# Patient Record
Sex: Male | Born: 1940 | Race: White | Hispanic: No | Marital: Married | Smoking: Former smoker
Health system: Southern US, Community
[De-identification: ages and names within clinical notes are randomized; demographics above are authoritative.]

## PROBLEM LIST (undated history)

## (undated) DIAGNOSIS — I2510 Atherosclerotic heart disease of native coronary artery without angina pectoris: Secondary | ICD-10-CM

## (undated) DIAGNOSIS — Z860100 Personal history of colon polyps, unspecified: Secondary | ICD-10-CM

## (undated) DIAGNOSIS — D649 Anemia, unspecified: Secondary | ICD-10-CM

## (undated) DIAGNOSIS — C801 Malignant (primary) neoplasm, unspecified: Secondary | ICD-10-CM

## (undated) DIAGNOSIS — D692 Other nonthrombocytopenic purpura: Secondary | ICD-10-CM

## (undated) DIAGNOSIS — R252 Cramp and spasm: Secondary | ICD-10-CM

## (undated) DIAGNOSIS — R7303 Prediabetes: Secondary | ICD-10-CM

## (undated) DIAGNOSIS — M1990 Unspecified osteoarthritis, unspecified site: Secondary | ICD-10-CM

## (undated) DIAGNOSIS — I700 Atherosclerosis of aorta: Secondary | ICD-10-CM

## (undated) DIAGNOSIS — I10 Essential (primary) hypertension: Secondary | ICD-10-CM

## (undated) DIAGNOSIS — I350 Nonrheumatic aortic (valve) stenosis: Secondary | ICD-10-CM

## (undated) DIAGNOSIS — M2550 Pain in unspecified joint: Secondary | ICD-10-CM

## (undated) DIAGNOSIS — R0600 Dyspnea, unspecified: Secondary | ICD-10-CM

## (undated) DIAGNOSIS — N183 Chronic kidney disease, stage 3 unspecified: Secondary | ICD-10-CM

## (undated) DIAGNOSIS — R3915 Urgency of urination: Secondary | ICD-10-CM

## (undated) DIAGNOSIS — D494 Neoplasm of unspecified behavior of bladder: Secondary | ICD-10-CM

## (undated) DIAGNOSIS — N189 Chronic kidney disease, unspecified: Secondary | ICD-10-CM

## (undated) DIAGNOSIS — R011 Cardiac murmur, unspecified: Secondary | ICD-10-CM

## (undated) DIAGNOSIS — I209 Angina pectoris, unspecified: Secondary | ICD-10-CM

## (undated) DIAGNOSIS — C44300 Unspecified malignant neoplasm of skin of unspecified part of face: Secondary | ICD-10-CM

## (undated) DIAGNOSIS — E119 Type 2 diabetes mellitus without complications: Secondary | ICD-10-CM

## (undated) DIAGNOSIS — I272 Pulmonary hypertension, unspecified: Secondary | ICD-10-CM

## (undated) DIAGNOSIS — H269 Unspecified cataract: Secondary | ICD-10-CM

## (undated) DIAGNOSIS — I499 Cardiac arrhythmia, unspecified: Secondary | ICD-10-CM

## (undated) DIAGNOSIS — I509 Heart failure, unspecified: Secondary | ICD-10-CM

## (undated) DIAGNOSIS — E785 Hyperlipidemia, unspecified: Secondary | ICD-10-CM

## (undated) DIAGNOSIS — Z789 Other specified health status: Secondary | ICD-10-CM

## (undated) HISTORY — DX: Chronic kidney disease, unspecified: N18.9

## (undated) HISTORY — DX: Unspecified malignant neoplasm of skin of unspecified part of face: C44.300

## (undated) HISTORY — DX: Cardiac murmur, unspecified: R01.1

## (undated) HISTORY — DX: Hyperlipidemia, unspecified: E78.5

## (undated) HISTORY — PX: NO PAST SURGERIES: SHX2092

## (undated) HISTORY — DX: Essential (primary) hypertension: I10

## (undated) HISTORY — PX: SKIN SURGERY: SHX2413

## (undated) HISTORY — DX: Anemia, unspecified: D64.9

## (undated) HISTORY — PX: CARDIAC VALVE REPLACEMENT: SHX585

## (undated) HISTORY — DX: Unspecified osteoarthritis, unspecified site: M19.90

## (undated) HISTORY — PX: JOINT REPLACEMENT: SHX530

## (undated) HISTORY — PX: CYSTOURETHROSCOPY: SHX476

## (undated) HISTORY — DX: Type 2 diabetes mellitus without complications: E11.9

## (undated) HISTORY — DX: Other nonthrombocytopenic purpura: D69.2

## (undated) HISTORY — PX: BLADDER SURGERY: SHX569

## (undated) HISTORY — DX: Malignant (primary) neoplasm, unspecified: C80.1

## (undated) HISTORY — PX: COLONOSCOPY: SHX174

---

## 2005-03-01 LAB — HM COLONOSCOPY

## 2007-03-02 DIAGNOSIS — E119 Type 2 diabetes mellitus without complications: Secondary | ICD-10-CM

## 2007-03-02 HISTORY — DX: Type 2 diabetes mellitus without complications: E11.9

## 2010-01-30 ENCOUNTER — Inpatient Hospital Stay (HOSPITAL_COMMUNITY)
Admission: RE | Admit: 2010-01-30 | Discharge: 2010-02-02 | Payer: Self-pay | Source: Home / Self Care | Admitting: Orthopedic Surgery

## 2010-05-12 LAB — URINALYSIS, ROUTINE W REFLEX MICROSCOPIC
Bilirubin Urine: NEGATIVE
Glucose, UA: 100 mg/dL — AB
Hgb urine dipstick: NEGATIVE
Specific Gravity, Urine: 1.02 (ref 1.005–1.030)

## 2010-05-12 LAB — DIFFERENTIAL
Basophils Relative: 0 % (ref 0–1)
Eosinophils Absolute: 0.2 10*3/uL (ref 0.0–0.7)
Eosinophils Relative: 3 % (ref 0–5)
Lymphs Abs: 2.2 10*3/uL (ref 0.7–4.0)

## 2010-05-12 LAB — CBC
HCT: 29.4 % — ABNORMAL LOW (ref 39.0–52.0)
HCT: 39.2 % (ref 39.0–52.0)
Hemoglobin: 9.7 g/dL — ABNORMAL LOW (ref 13.0–17.0)
Hemoglobin: 13.6 g/dL (ref 13.0–17.0)
MCHC: 33 g/dL (ref 30.0–36.0)
MCHC: 33.1 g/dL (ref 30.0–36.0)
Platelets: 186 10*3/uL (ref 150–400)
Platelets: 203 10*3/uL (ref 150–400)
RBC: 3.45 MIL/uL — ABNORMAL LOW (ref 4.22–5.81)
RBC: 4.6 MIL/uL (ref 4.22–5.81)
RDW: 12.9 % (ref 11.5–15.5)
WBC: 8.6 10*3/uL (ref 4.0–10.5)
WBC: 6.3 10*3/uL (ref 4.0–10.5)

## 2010-05-12 LAB — TYPE AND SCREEN: Antibody Screen: NEGATIVE

## 2010-05-12 LAB — GLUCOSE, CAPILLARY
Glucose-Capillary: 138 mg/dL — ABNORMAL HIGH (ref 70–99)
Glucose-Capillary: 155 mg/dL — ABNORMAL HIGH (ref 70–99)
Glucose-Capillary: 169 mg/dL — ABNORMAL HIGH (ref 70–99)
Glucose-Capillary: 172 mg/dL — ABNORMAL HIGH (ref 70–99)
Glucose-Capillary: 173 mg/dL — ABNORMAL HIGH (ref 70–99)
Glucose-Capillary: 176 mg/dL — ABNORMAL HIGH (ref 70–99)
Glucose-Capillary: 180 mg/dL — ABNORMAL HIGH (ref 70–99)
Glucose-Capillary: 184 mg/dL — ABNORMAL HIGH (ref 70–99)

## 2010-05-12 LAB — PROTIME-INR
Prothrombin Time: 19.7 seconds — ABNORMAL HIGH (ref 11.6–15.2)
Prothrombin Time: 13.1 seconds (ref 11.6–15.2)

## 2010-05-12 LAB — SURGICAL PCR SCREEN
MRSA, PCR: NEGATIVE
Staphylococcus aureus: NEGATIVE

## 2010-05-12 LAB — COMPREHENSIVE METABOLIC PANEL
ALT: 32 U/L (ref 0–53)
AST: 35 U/L (ref 0–37)
Alkaline Phosphatase: 65 U/L (ref 39–117)
CO2: 30 mEq/L (ref 19–32)
Calcium: 8.9 mg/dL (ref 8.4–10.5)
Chloride: 101 mEq/L (ref 96–112)
GFR calc Af Amer: >60 mL/min (ref >60)
GFR calc non Af Amer: >60 mL/min (ref >60)
Potassium: 3.6 mEq/L (ref 3.5–5.1)
Sodium: 141 mEq/L (ref 135–145)
Total Bilirubin: 0.8 mg/dL (ref 0.3–1.2)

## 2010-05-12 LAB — HEMOGLOBIN AND HEMATOCRIT, BLOOD: Hemoglobin: 11 g/dL — ABNORMAL LOW (ref 13.0–17.0)

## 2010-05-12 LAB — ABO/RH: ABO/RH(D): A NEG

## 2011-07-07 DIAGNOSIS — E291 Testicular hypofunction: Secondary | ICD-10-CM | POA: Diagnosis not present

## 2011-07-07 DIAGNOSIS — N529 Male erectile dysfunction, unspecified: Secondary | ICD-10-CM | POA: Diagnosis not present

## 2011-07-30 DIAGNOSIS — M169 Osteoarthritis of hip, unspecified: Secondary | ICD-10-CM | POA: Diagnosis not present

## 2011-09-24 DIAGNOSIS — I10 Essential (primary) hypertension: Secondary | ICD-10-CM | POA: Diagnosis not present

## 2011-09-24 DIAGNOSIS — E7800 Pure hypercholesterolemia, unspecified: Secondary | ICD-10-CM | POA: Diagnosis not present

## 2011-09-24 DIAGNOSIS — E785 Hyperlipidemia, unspecified: Secondary | ICD-10-CM | POA: Diagnosis not present

## 2011-09-24 DIAGNOSIS — E119 Type 2 diabetes mellitus without complications: Secondary | ICD-10-CM | POA: Diagnosis not present

## 2011-09-29 DIAGNOSIS — M2040 Other hammer toe(s) (acquired), unspecified foot: Secondary | ICD-10-CM | POA: Diagnosis not present

## 2011-09-29 DIAGNOSIS — B351 Tinea unguium: Secondary | ICD-10-CM | POA: Diagnosis not present

## 2011-09-29 DIAGNOSIS — M79609 Pain in unspecified limb: Secondary | ICD-10-CM | POA: Diagnosis not present

## 2011-12-23 DIAGNOSIS — E119 Type 2 diabetes mellitus without complications: Secondary | ICD-10-CM | POA: Diagnosis not present

## 2011-12-23 DIAGNOSIS — E7800 Pure hypercholesterolemia, unspecified: Secondary | ICD-10-CM | POA: Diagnosis not present

## 2011-12-23 DIAGNOSIS — I10 Essential (primary) hypertension: Secondary | ICD-10-CM | POA: Diagnosis not present

## 2011-12-23 DIAGNOSIS — E785 Hyperlipidemia, unspecified: Secondary | ICD-10-CM | POA: Diagnosis not present

## 2011-12-23 DIAGNOSIS — Z23 Encounter for immunization: Secondary | ICD-10-CM | POA: Diagnosis not present

## 2012-03-10 DIAGNOSIS — M1710 Unilateral primary osteoarthritis, unspecified knee: Secondary | ICD-10-CM | POA: Diagnosis not present

## 2012-04-07 DIAGNOSIS — E7800 Pure hypercholesterolemia, unspecified: Secondary | ICD-10-CM | POA: Diagnosis not present

## 2012-04-07 DIAGNOSIS — I10 Essential (primary) hypertension: Secondary | ICD-10-CM | POA: Diagnosis not present

## 2012-04-07 DIAGNOSIS — E119 Type 2 diabetes mellitus without complications: Secondary | ICD-10-CM | POA: Diagnosis not present

## 2012-04-07 DIAGNOSIS — E785 Hyperlipidemia, unspecified: Secondary | ICD-10-CM | POA: Diagnosis not present

## 2012-05-04 DIAGNOSIS — M1710 Unilateral primary osteoarthritis, unspecified knee: Secondary | ICD-10-CM | POA: Diagnosis not present

## 2012-05-24 DIAGNOSIS — I10 Essential (primary) hypertension: Secondary | ICD-10-CM | POA: Diagnosis not present

## 2012-05-24 DIAGNOSIS — E785 Hyperlipidemia, unspecified: Secondary | ICD-10-CM | POA: Diagnosis not present

## 2012-05-24 DIAGNOSIS — Z9181 History of falling: Secondary | ICD-10-CM | POA: Diagnosis not present

## 2012-11-22 DIAGNOSIS — E785 Hyperlipidemia, unspecified: Secondary | ICD-10-CM | POA: Diagnosis not present

## 2012-11-22 DIAGNOSIS — IMO0001 Reserved for inherently not codable concepts without codable children: Secondary | ICD-10-CM | POA: Diagnosis not present

## 2012-11-22 DIAGNOSIS — Z0000 Encounter for general adult medical examination without abnormal findings: Secondary | ICD-10-CM | POA: Diagnosis not present

## 2012-11-22 DIAGNOSIS — R011 Cardiac murmur, unspecified: Secondary | ICD-10-CM | POA: Diagnosis not present

## 2012-11-22 DIAGNOSIS — I059 Rheumatic mitral valve disease, unspecified: Secondary | ICD-10-CM | POA: Diagnosis not present

## 2012-11-22 DIAGNOSIS — I10 Essential (primary) hypertension: Secondary | ICD-10-CM | POA: Diagnosis not present

## 2012-11-22 DIAGNOSIS — Z125 Encounter for screening for malignant neoplasm of prostate: Secondary | ICD-10-CM | POA: Diagnosis not present

## 2012-11-28 DIAGNOSIS — E119 Type 2 diabetes mellitus without complications: Secondary | ICD-10-CM | POA: Diagnosis not present

## 2012-12-06 DIAGNOSIS — R319 Hematuria, unspecified: Secondary | ICD-10-CM | POA: Diagnosis not present

## 2012-12-06 DIAGNOSIS — E291 Testicular hypofunction: Secondary | ICD-10-CM | POA: Diagnosis not present

## 2012-12-06 DIAGNOSIS — Z8551 Personal history of malignant neoplasm of bladder: Secondary | ICD-10-CM | POA: Diagnosis not present

## 2012-12-06 DIAGNOSIS — C679 Malignant neoplasm of bladder, unspecified: Secondary | ICD-10-CM | POA: Diagnosis not present

## 2012-12-13 ENCOUNTER — Ambulatory Visit: Payer: Self-pay | Admitting: Urology

## 2012-12-13 DIAGNOSIS — I10 Essential (primary) hypertension: Secondary | ICD-10-CM | POA: Diagnosis not present

## 2012-12-13 DIAGNOSIS — Z01812 Encounter for preprocedural laboratory examination: Secondary | ICD-10-CM | POA: Diagnosis not present

## 2012-12-13 DIAGNOSIS — N329 Bladder disorder, unspecified: Secondary | ICD-10-CM | POA: Diagnosis not present

## 2012-12-13 DIAGNOSIS — Z01810 Encounter for preprocedural cardiovascular examination: Secondary | ICD-10-CM | POA: Diagnosis not present

## 2012-12-13 LAB — BASIC METABOLIC PANEL
Calcium, Total: 9 mg/dL (ref 8.5–10.1)
Co2: 33 mmol/L — ABNORMAL HIGH (ref 21–32)
Creatinine: 0.98 mg/dL (ref 0.60–1.30)
EGFR (Non-African Amer.): >60
Glucose: 151 mg/dL — ABNORMAL HIGH (ref 65–99)

## 2012-12-18 ENCOUNTER — Ambulatory Visit: Payer: Self-pay | Admitting: Urology

## 2012-12-18 DIAGNOSIS — Z96649 Presence of unspecified artificial hip joint: Secondary | ICD-10-CM | POA: Diagnosis not present

## 2012-12-18 DIAGNOSIS — Z87891 Personal history of nicotine dependence: Secondary | ICD-10-CM | POA: Diagnosis not present

## 2012-12-18 DIAGNOSIS — C679 Malignant neoplasm of bladder, unspecified: Secondary | ICD-10-CM | POA: Diagnosis not present

## 2012-12-18 DIAGNOSIS — Z79899 Other long term (current) drug therapy: Secondary | ICD-10-CM | POA: Diagnosis not present

## 2012-12-18 DIAGNOSIS — R011 Cardiac murmur, unspecified: Secondary | ICD-10-CM | POA: Diagnosis not present

## 2012-12-18 DIAGNOSIS — E785 Hyperlipidemia, unspecified: Secondary | ICD-10-CM | POA: Diagnosis not present

## 2012-12-18 DIAGNOSIS — I10 Essential (primary) hypertension: Secondary | ICD-10-CM | POA: Diagnosis not present

## 2012-12-18 DIAGNOSIS — E119 Type 2 diabetes mellitus without complications: Secondary | ICD-10-CM | POA: Diagnosis not present

## 2012-12-18 DIAGNOSIS — M129 Arthropathy, unspecified: Secondary | ICD-10-CM | POA: Diagnosis not present

## 2012-12-18 DIAGNOSIS — Z8551 Personal history of malignant neoplasm of bladder: Secondary | ICD-10-CM | POA: Diagnosis not present

## 2012-12-18 DIAGNOSIS — N400 Enlarged prostate without lower urinary tract symptoms: Secondary | ICD-10-CM | POA: Diagnosis not present

## 2012-12-18 DIAGNOSIS — N419 Inflammatory disease of prostate, unspecified: Secondary | ICD-10-CM | POA: Diagnosis not present

## 2012-12-19 DIAGNOSIS — C679 Malignant neoplasm of bladder, unspecified: Secondary | ICD-10-CM | POA: Diagnosis not present

## 2012-12-19 DIAGNOSIS — C669 Malignant neoplasm of unspecified ureter: Secondary | ICD-10-CM | POA: Diagnosis not present

## 2012-12-20 LAB — PATHOLOGY REPORT

## 2012-12-26 DIAGNOSIS — C669 Malignant neoplasm of unspecified ureter: Secondary | ICD-10-CM | POA: Diagnosis not present

## 2012-12-26 DIAGNOSIS — C679 Malignant neoplasm of bladder, unspecified: Secondary | ICD-10-CM | POA: Diagnosis not present

## 2012-12-27 ENCOUNTER — Ambulatory Visit: Payer: Self-pay | Admitting: Urology

## 2012-12-27 DIAGNOSIS — C669 Malignant neoplasm of unspecified ureter: Secondary | ICD-10-CM | POA: Diagnosis not present

## 2012-12-27 DIAGNOSIS — N1330 Unspecified hydronephrosis: Secondary | ICD-10-CM | POA: Diagnosis not present

## 2012-12-27 DIAGNOSIS — N134 Hydroureter: Secondary | ICD-10-CM | POA: Diagnosis not present

## 2012-12-27 DIAGNOSIS — K8020 Calculus of gallbladder without cholecystitis without obstruction: Secondary | ICD-10-CM | POA: Diagnosis not present

## 2012-12-27 DIAGNOSIS — D4959 Neoplasm of unspecified behavior of other genitourinary organ: Secondary | ICD-10-CM | POA: Diagnosis not present

## 2012-12-29 DIAGNOSIS — C669 Malignant neoplasm of unspecified ureter: Secondary | ICD-10-CM | POA: Diagnosis not present

## 2013-01-04 DIAGNOSIS — C679 Malignant neoplasm of bladder, unspecified: Secondary | ICD-10-CM | POA: Diagnosis not present

## 2013-01-11 DIAGNOSIS — C679 Malignant neoplasm of bladder, unspecified: Secondary | ICD-10-CM | POA: Diagnosis not present

## 2013-01-19 DIAGNOSIS — Z96649 Presence of unspecified artificial hip joint: Secondary | ICD-10-CM | POA: Diagnosis not present

## 2013-01-19 DIAGNOSIS — Z87891 Personal history of nicotine dependence: Secondary | ICD-10-CM | POA: Diagnosis not present

## 2013-01-19 DIAGNOSIS — I10 Essential (primary) hypertension: Secondary | ICD-10-CM | POA: Insufficient documentation

## 2013-01-19 DIAGNOSIS — Z01812 Encounter for preprocedural laboratory examination: Secondary | ICD-10-CM | POA: Diagnosis not present

## 2013-01-19 DIAGNOSIS — Z01818 Encounter for other preprocedural examination: Secondary | ICD-10-CM | POA: Diagnosis not present

## 2013-01-19 DIAGNOSIS — Z96659 Presence of unspecified artificial knee joint: Secondary | ICD-10-CM | POA: Diagnosis not present

## 2013-01-19 DIAGNOSIS — E119 Type 2 diabetes mellitus without complications: Secondary | ICD-10-CM | POA: Diagnosis not present

## 2013-01-19 DIAGNOSIS — C676 Malignant neoplasm of ureteric orifice: Secondary | ICD-10-CM | POA: Diagnosis not present

## 2013-01-19 DIAGNOSIS — E785 Hyperlipidemia, unspecified: Secondary | ICD-10-CM | POA: Insufficient documentation

## 2013-01-19 DIAGNOSIS — N1330 Unspecified hydronephrosis: Secondary | ICD-10-CM | POA: Diagnosis not present

## 2013-01-19 DIAGNOSIS — M1710 Unilateral primary osteoarthritis, unspecified knee: Secondary | ICD-10-CM | POA: Diagnosis not present

## 2013-01-19 DIAGNOSIS — Z79899 Other long term (current) drug therapy: Secondary | ICD-10-CM | POA: Diagnosis not present

## 2013-01-29 DIAGNOSIS — Z79899 Other long term (current) drug therapy: Secondary | ICD-10-CM | POA: Diagnosis not present

## 2013-01-29 DIAGNOSIS — Z87891 Personal history of nicotine dependence: Secondary | ICD-10-CM | POA: Diagnosis not present

## 2013-01-29 DIAGNOSIS — I10 Essential (primary) hypertension: Secondary | ICD-10-CM | POA: Diagnosis not present

## 2013-01-29 DIAGNOSIS — E119 Type 2 diabetes mellitus without complications: Secondary | ICD-10-CM | POA: Diagnosis not present

## 2013-01-29 DIAGNOSIS — E785 Hyperlipidemia, unspecified: Secondary | ICD-10-CM | POA: Diagnosis not present

## 2013-01-29 DIAGNOSIS — Z96659 Presence of unspecified artificial knee joint: Secondary | ICD-10-CM | POA: Diagnosis not present

## 2013-01-29 DIAGNOSIS — Z96649 Presence of unspecified artificial hip joint: Secondary | ICD-10-CM | POA: Diagnosis not present

## 2013-01-29 DIAGNOSIS — D4959 Neoplasm of unspecified behavior of other genitourinary organ: Secondary | ICD-10-CM | POA: Diagnosis not present

## 2013-01-29 DIAGNOSIS — Z8551 Personal history of malignant neoplasm of bladder: Secondary | ICD-10-CM | POA: Diagnosis not present

## 2013-01-29 DIAGNOSIS — N1330 Unspecified hydronephrosis: Secondary | ICD-10-CM | POA: Diagnosis not present

## 2013-02-05 DIAGNOSIS — R319 Hematuria, unspecified: Secondary | ICD-10-CM | POA: Diagnosis not present

## 2013-02-05 DIAGNOSIS — E785 Hyperlipidemia, unspecified: Secondary | ICD-10-CM | POA: Diagnosis not present

## 2013-02-05 DIAGNOSIS — N1330 Unspecified hydronephrosis: Secondary | ICD-10-CM | POA: Diagnosis not present

## 2013-02-05 DIAGNOSIS — E119 Type 2 diabetes mellitus without complications: Secondary | ICD-10-CM | POA: Diagnosis not present

## 2013-02-05 DIAGNOSIS — R3989 Other symptoms and signs involving the genitourinary system: Secondary | ICD-10-CM | POA: Diagnosis not present

## 2013-02-05 DIAGNOSIS — Z96659 Presence of unspecified artificial knee joint: Secondary | ICD-10-CM | POA: Diagnosis not present

## 2013-02-05 DIAGNOSIS — Z9889 Other specified postprocedural states: Secondary | ICD-10-CM | POA: Diagnosis not present

## 2013-02-05 DIAGNOSIS — I10 Essential (primary) hypertension: Secondary | ICD-10-CM | POA: Diagnosis not present

## 2013-02-05 DIAGNOSIS — C669 Malignant neoplasm of unspecified ureter: Secondary | ICD-10-CM | POA: Diagnosis not present

## 2013-02-05 DIAGNOSIS — Z8551 Personal history of malignant neoplasm of bladder: Secondary | ICD-10-CM | POA: Diagnosis not present

## 2013-02-05 DIAGNOSIS — Z01818 Encounter for other preprocedural examination: Secondary | ICD-10-CM | POA: Diagnosis not present

## 2013-02-05 DIAGNOSIS — F17200 Nicotine dependence, unspecified, uncomplicated: Secondary | ICD-10-CM | POA: Diagnosis not present

## 2013-02-05 DIAGNOSIS — R3129 Other microscopic hematuria: Secondary | ICD-10-CM | POA: Diagnosis not present

## 2013-03-07 DIAGNOSIS — I10 Essential (primary) hypertension: Secondary | ICD-10-CM | POA: Diagnosis present

## 2013-03-07 DIAGNOSIS — Z466 Encounter for fitting and adjustment of urinary device: Secondary | ICD-10-CM | POA: Diagnosis not present

## 2013-03-07 DIAGNOSIS — E119 Type 2 diabetes mellitus without complications: Secondary | ICD-10-CM | POA: Diagnosis present

## 2013-03-07 DIAGNOSIS — C679 Malignant neoplasm of bladder, unspecified: Secondary | ICD-10-CM | POA: Diagnosis not present

## 2013-03-07 DIAGNOSIS — Z8551 Personal history of malignant neoplasm of bladder: Secondary | ICD-10-CM | POA: Diagnosis not present

## 2013-03-07 DIAGNOSIS — C669 Malignant neoplasm of unspecified ureter: Secondary | ICD-10-CM | POA: Diagnosis not present

## 2013-03-07 DIAGNOSIS — E785 Hyperlipidemia, unspecified: Secondary | ICD-10-CM | POA: Diagnosis present

## 2013-03-14 DIAGNOSIS — Z8551 Personal history of malignant neoplasm of bladder: Secondary | ICD-10-CM | POA: Diagnosis not present

## 2013-03-14 DIAGNOSIS — Z09 Encounter for follow-up examination after completed treatment for conditions other than malignant neoplasm: Secondary | ICD-10-CM | POA: Diagnosis not present

## 2013-03-14 DIAGNOSIS — C669 Malignant neoplasm of unspecified ureter: Secondary | ICD-10-CM | POA: Diagnosis not present

## 2013-04-05 DIAGNOSIS — Z466 Encounter for fitting and adjustment of urinary device: Secondary | ICD-10-CM | POA: Diagnosis not present

## 2013-04-05 DIAGNOSIS — C669 Malignant neoplasm of unspecified ureter: Secondary | ICD-10-CM | POA: Diagnosis not present

## 2013-05-22 DIAGNOSIS — E785 Hyperlipidemia, unspecified: Secondary | ICD-10-CM | POA: Diagnosis not present

## 2013-05-22 DIAGNOSIS — E1129 Type 2 diabetes mellitus with other diabetic kidney complication: Secondary | ICD-10-CM | POA: Diagnosis not present

## 2013-05-22 DIAGNOSIS — I10 Essential (primary) hypertension: Secondary | ICD-10-CM | POA: Diagnosis not present

## 2013-05-22 DIAGNOSIS — IMO0001 Reserved for inherently not codable concepts without codable children: Secondary | ICD-10-CM | POA: Diagnosis not present

## 2013-05-22 DIAGNOSIS — N182 Chronic kidney disease, stage 2 (mild): Secondary | ICD-10-CM | POA: Diagnosis not present

## 2013-08-23 DIAGNOSIS — Z8551 Personal history of malignant neoplasm of bladder: Secondary | ICD-10-CM | POA: Diagnosis not present

## 2013-08-23 DIAGNOSIS — D494 Neoplasm of unspecified behavior of bladder: Secondary | ICD-10-CM | POA: Diagnosis not present

## 2013-10-08 DIAGNOSIS — H6120 Impacted cerumen, unspecified ear: Secondary | ICD-10-CM | POA: Diagnosis not present

## 2013-10-10 DIAGNOSIS — R51 Headache: Secondary | ICD-10-CM | POA: Diagnosis not present

## 2013-11-23 DIAGNOSIS — E1129 Type 2 diabetes mellitus with other diabetic kidney complication: Secondary | ICD-10-CM | POA: Diagnosis not present

## 2013-11-23 DIAGNOSIS — D649 Anemia, unspecified: Secondary | ICD-10-CM | POA: Diagnosis not present

## 2013-11-23 DIAGNOSIS — Z125 Encounter for screening for malignant neoplasm of prostate: Secondary | ICD-10-CM | POA: Diagnosis not present

## 2013-11-23 DIAGNOSIS — N182 Chronic kidney disease, stage 2 (mild): Secondary | ICD-10-CM | POA: Diagnosis not present

## 2013-11-23 DIAGNOSIS — Z1159 Encounter for screening for other viral diseases: Secondary | ICD-10-CM | POA: Diagnosis not present

## 2013-11-23 DIAGNOSIS — Z23 Encounter for immunization: Secondary | ICD-10-CM | POA: Diagnosis not present

## 2013-11-23 DIAGNOSIS — I129 Hypertensive chronic kidney disease with stage 1 through stage 4 chronic kidney disease, or unspecified chronic kidney disease: Secondary | ICD-10-CM | POA: Diagnosis not present

## 2013-11-23 LAB — PSA

## 2014-01-15 DIAGNOSIS — C679 Malignant neoplasm of bladder, unspecified: Secondary | ICD-10-CM | POA: Diagnosis not present

## 2014-01-15 DIAGNOSIS — D414 Neoplasm of uncertain behavior of bladder: Secondary | ICD-10-CM | POA: Diagnosis not present

## 2014-02-13 DIAGNOSIS — Z96652 Presence of left artificial knee joint: Secondary | ICD-10-CM | POA: Diagnosis not present

## 2014-02-13 DIAGNOSIS — Z8554 Personal history of malignant neoplasm of ureter: Secondary | ICD-10-CM | POA: Diagnosis not present

## 2014-02-13 DIAGNOSIS — Z01818 Encounter for other preprocedural examination: Secondary | ICD-10-CM | POA: Diagnosis not present

## 2014-02-13 DIAGNOSIS — I10 Essential (primary) hypertension: Secondary | ICD-10-CM | POA: Diagnosis not present

## 2014-02-13 DIAGNOSIS — C688 Malignant neoplasm of overlapping sites of urinary organs: Secondary | ICD-10-CM | POA: Diagnosis not present

## 2014-02-13 DIAGNOSIS — C689 Malignant neoplasm of urinary organ, unspecified: Secondary | ICD-10-CM | POA: Diagnosis not present

## 2014-02-13 DIAGNOSIS — E119 Type 2 diabetes mellitus without complications: Secondary | ICD-10-CM | POA: Diagnosis not present

## 2014-02-13 DIAGNOSIS — D414 Neoplasm of uncertain behavior of bladder: Secondary | ICD-10-CM | POA: Diagnosis not present

## 2014-03-11 DIAGNOSIS — D494 Neoplasm of unspecified behavior of bladder: Secondary | ICD-10-CM | POA: Diagnosis not present

## 2014-03-11 DIAGNOSIS — C679 Malignant neoplasm of bladder, unspecified: Secondary | ICD-10-CM | POA: Diagnosis not present

## 2014-03-11 DIAGNOSIS — C674 Malignant neoplasm of posterior wall of bladder: Secondary | ICD-10-CM | POA: Diagnosis not present

## 2014-03-11 DIAGNOSIS — E785 Hyperlipidemia, unspecified: Secondary | ICD-10-CM | POA: Diagnosis not present

## 2014-03-11 DIAGNOSIS — Z96652 Presence of left artificial knee joint: Secondary | ICD-10-CM | POA: Diagnosis not present

## 2014-03-11 DIAGNOSIS — C688 Malignant neoplasm of overlapping sites of urinary organs: Secondary | ICD-10-CM | POA: Diagnosis not present

## 2014-03-11 DIAGNOSIS — Z9889 Other specified postprocedural states: Secondary | ICD-10-CM | POA: Diagnosis not present

## 2014-03-11 DIAGNOSIS — I10 Essential (primary) hypertension: Secondary | ICD-10-CM | POA: Diagnosis not present

## 2014-03-11 DIAGNOSIS — E119 Type 2 diabetes mellitus without complications: Secondary | ICD-10-CM | POA: Diagnosis not present

## 2014-05-07 DIAGNOSIS — E119 Type 2 diabetes mellitus without complications: Secondary | ICD-10-CM | POA: Diagnosis not present

## 2014-05-24 DIAGNOSIS — N182 Chronic kidney disease, stage 2 (mild): Secondary | ICD-10-CM | POA: Diagnosis not present

## 2014-05-24 DIAGNOSIS — I129 Hypertensive chronic kidney disease with stage 1 through stage 4 chronic kidney disease, or unspecified chronic kidney disease: Secondary | ICD-10-CM | POA: Diagnosis not present

## 2014-05-24 DIAGNOSIS — E785 Hyperlipidemia, unspecified: Secondary | ICD-10-CM | POA: Diagnosis not present

## 2014-05-24 DIAGNOSIS — D129 Benign neoplasm of anus and anal canal: Secondary | ICD-10-CM | POA: Diagnosis not present

## 2014-05-24 DIAGNOSIS — E1129 Type 2 diabetes mellitus with other diabetic kidney complication: Secondary | ICD-10-CM | POA: Diagnosis not present

## 2014-05-24 DIAGNOSIS — I10 Essential (primary) hypertension: Secondary | ICD-10-CM | POA: Diagnosis not present

## 2014-06-21 NOTE — H&P (Signed)
PATIENT NAME:  John [name], John [name] MR#:  [id number] DATE OF BIRTH:  1940/05/14  DATE OF ADMISSION:  12/18/2012  CHIEF COMPLAINT: Bladder cancer.   HISTORY OF PRESENT ILLNESS: John [name] is a 74 year old white male with microscopic hematuria, who was found to have a 10 x 10 mm papillary bladder tumor at the time of cystoscopy on October 8. He comes in now for transurethral resection of the bladder tumor. He does have a past history of superficial bladder cancer.   PAST MEDICAL HISTORY:   ALLERGIES: No drug allergies.   CURRENT MEDICATIONS: Include lovastatin, amlodipine, Diovan and metformin.   PAST SURGICAL HISTORY: Transurethral resection of bladder tumor in 2004 and multiple colonoscopies for colon polyps.   SOCIAL HISTORY: The patient denied tobacco or alcohol use.   FAMILY HISTORY: Negative for kidney disease or prostate cancer.   PAST AND CURRENT MEDICAL CONDITIONS:  1.  Hypertension.  2.  Hyperlipidemia.  3.  Diabetes.   REVIEW OF SYSTEMS: The patient denied chest pain, shortness of breath, stroke or heart disease.   PHYSICAL EXAMINATION: GENERAL: Well-nourished white male in no distress.  HEENT: Sclerae were clear. Pupils were equally round, reactive to light and accommodation. Extraocular movements were intact.  NECK: Supple. No palpable cervical adenopathy.  LUNGS: Clear to auscultation.  CARDIOVASCULAR: Regular rhythm and rate without audible murmurs.  ABDOMEN: Soft, nontender abdomen. No palpable abdominal masses.  GENITOURINARY: Uncircumcised. Testes 18 mL in size each.  RECTAL: 40 gram, smooth nontender prostate.  NEUROMUSCULAR: Nonfocal. The patient was alert and oriented x 3.   IMPRESSION: Superficial bladder cancer.   PLAN: Transurethral resection of bladder tumor.   ____________________________ Otelia Limes. Yves Dill, MD [email or web address] D: 12/13/2012 13:43:00 ET T: 12/13/2012 14:25:19 ET JOB#: [id number]  cc: Otelia Limes. Yves Dill, MD, <Dictator> Royston Cowper MD ELECTRONICALLY  SIGNED 12/14/2012 9:45

## 2014-06-21 NOTE — Op Note (Signed)
PATIENT NAME:  John [name], John [name] MR#:  [id number] DATE OF BIRTH:  [birth year]  DATE OF PROCEDURE:  12/18/2012  PREOPERATIVE DIAGNOSIS: Transitional cell carcinoma of the bladder.   POSTOPERATIVE DIAGNOSES: 1.  Transitional cell carcinoma of the bladder.  2.  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   PROCEDURES:  1.  Transurethral resection of bladder tumor.  2.  Instillation of mitomycin-C into the bladder.   SURGEON: Maryan Puls, MD.  ANESTHETIST: Dr. Benjamine Mola and Dr. Yves Dill.  ANESTHETIC METHOD: General per Dr. Benjamine Mola and local per Dr. Yves Dill.   INDICATIONS: See the dictated history and physical. After informed consent, the patient requests the above procedure.   OPERATIVE SUMMARY: After adequate general anesthesia had been obtained, the patient was placed into dorsal lithotomy position and the perineum was prepped and draped in the usual fashion. The resectoscope was then visually advanced into the bladder. The bladder was thoroughly inspected. The left orifice was identified and had clear efflux. The right orifice was obscured by papillary bladder tumor. The patient had a 10 x 10 mm papillary tumor in this location plus several 1 to 2 mm satellite papillary tumors. At this point, the bladder tumor was resected. After clearing the superficial portion of the tumor, there appeared to be a papillary tumor exiting and inside the right ureteral orifice. Therefore, the right orifice was resected. The resection proceeded through the intramural ureter and there was still residual tumor seen within the lumen of the residual ureter. At this point, all tumor fragments were removed from the bladder and sent to pathology. All bleeders were attended to with the cautery. The cystoscope was removed. 10 mL of viscous Xylocaine was instilled within the urethra and the bladder. A 81-LXBWIO silicone catheter was placed. Gentamicin 40 mg was instilled within the bladder and the catheter was plugged. A B and O  suppository was placed. The procedure was then terminated and the patient was transferred to the recovery room in stable condition.   ____________________________ Otelia Limes. Yves Dill, MD [email or web address] D: 12/18/2012 08:43:12 ET T: 12/18/2012 10:40:55 ET JOB#: [id number]  cc: Otelia Limes. Yves Dill, MD, <Dictator> Royston Cowper MD ELECTRONICALLY SIGNED 12/18/2012 11:46

## 2014-07-03 DIAGNOSIS — Z129 Encounter for screening for malignant neoplasm, site unspecified: Secondary | ICD-10-CM | POA: Diagnosis not present

## 2014-07-03 DIAGNOSIS — C679 Malignant neoplasm of bladder, unspecified: Secondary | ICD-10-CM | POA: Diagnosis not present

## 2014-08-05 DIAGNOSIS — C679 Malignant neoplasm of bladder, unspecified: Secondary | ICD-10-CM | POA: Diagnosis not present

## 2014-08-05 DIAGNOSIS — M170 Bilateral primary osteoarthritis of knee: Secondary | ICD-10-CM | POA: Diagnosis not present

## 2014-08-05 DIAGNOSIS — M25561 Pain in right knee: Secondary | ICD-10-CM | POA: Diagnosis not present

## 2014-08-05 DIAGNOSIS — M25461 Effusion, right knee: Secondary | ICD-10-CM | POA: Diagnosis not present

## 2014-10-31 DIAGNOSIS — M25552 Pain in left hip: Secondary | ICD-10-CM | POA: Diagnosis not present

## 2014-10-31 DIAGNOSIS — M47817 Spondylosis without myelopathy or radiculopathy, lumbosacral region: Secondary | ICD-10-CM | POA: Diagnosis not present

## 2014-11-01 DIAGNOSIS — C679 Malignant neoplasm of bladder, unspecified: Secondary | ICD-10-CM | POA: Diagnosis not present

## 2014-11-15 DIAGNOSIS — N183 Chronic kidney disease, stage 3 (moderate): Secondary | ICD-10-CM

## 2014-11-15 DIAGNOSIS — D649 Anemia, unspecified: Secondary | ICD-10-CM | POA: Insufficient documentation

## 2014-11-15 DIAGNOSIS — D509 Iron deficiency anemia, unspecified: Secondary | ICD-10-CM | POA: Insufficient documentation

## 2014-11-15 DIAGNOSIS — N182 Chronic kidney disease, stage 2 (mild): Secondary | ICD-10-CM

## 2014-11-15 DIAGNOSIS — E1159 Type 2 diabetes mellitus with other circulatory complications: Secondary | ICD-10-CM | POA: Insufficient documentation

## 2014-11-15 DIAGNOSIS — E1122 Type 2 diabetes mellitus with diabetic chronic kidney disease: Secondary | ICD-10-CM

## 2014-11-15 DIAGNOSIS — E785 Hyperlipidemia, unspecified: Secondary | ICD-10-CM

## 2014-11-15 DIAGNOSIS — I10 Essential (primary) hypertension: Secondary | ICD-10-CM

## 2014-11-15 DIAGNOSIS — C679 Malignant neoplasm of bladder, unspecified: Secondary | ICD-10-CM | POA: Insufficient documentation

## 2014-11-15 DIAGNOSIS — I1310 Hypertensive heart and chronic kidney disease without heart failure, with stage 1 through stage 4 chronic kidney disease, or unspecified chronic kidney disease: Secondary | ICD-10-CM | POA: Insufficient documentation

## 2014-11-15 DIAGNOSIS — I129 Hypertensive chronic kidney disease with stage 1 through stage 4 chronic kidney disease, or unspecified chronic kidney disease: Secondary | ICD-10-CM

## 2014-11-15 DIAGNOSIS — E1129 Type 2 diabetes mellitus with other diabetic kidney complication: Secondary | ICD-10-CM | POA: Insufficient documentation

## 2014-11-15 DIAGNOSIS — M1990 Unspecified osteoarthritis, unspecified site: Secondary | ICD-10-CM

## 2014-11-15 DIAGNOSIS — N529 Male erectile dysfunction, unspecified: Secondary | ICD-10-CM

## 2014-11-15 DIAGNOSIS — I130 Hypertensive heart and chronic kidney disease with heart failure and stage 1 through stage 4 chronic kidney disease, or unspecified chronic kidney disease: Secondary | ICD-10-CM | POA: Insufficient documentation

## 2014-11-15 DIAGNOSIS — M19011 Primary osteoarthritis, right shoulder: Secondary | ICD-10-CM | POA: Insufficient documentation

## 2014-11-15 DIAGNOSIS — E1169 Type 2 diabetes mellitus with other specified complication: Secondary | ICD-10-CM | POA: Insufficient documentation

## 2014-11-15 DIAGNOSIS — R011 Cardiac murmur, unspecified: Secondary | ICD-10-CM

## 2014-11-18 ENCOUNTER — Encounter: Payer: Self-pay | Admitting: Unknown Physician Specialty

## 2014-11-18 ENCOUNTER — Ambulatory Visit (INDEPENDENT_AMBULATORY_CARE_PROVIDER_SITE_OTHER): Payer: Medicare Other | Admitting: Unknown Physician Specialty

## 2014-11-18 VITALS — BP 137/73 | HR 62 | Temp 98.3°F | Ht 71.7 in | Wt 214.0 lb

## 2014-11-18 DIAGNOSIS — N184 Chronic kidney disease, stage 4 (severe): Secondary | ICD-10-CM | POA: Diagnosis not present

## 2014-11-18 DIAGNOSIS — N183 Chronic kidney disease, stage 3 (moderate): Secondary | ICD-10-CM | POA: Diagnosis not present

## 2014-11-18 DIAGNOSIS — N182 Chronic kidney disease, stage 2 (mild): Secondary | ICD-10-CM

## 2014-11-18 DIAGNOSIS — N181 Chronic kidney disease, stage 1: Secondary | ICD-10-CM | POA: Diagnosis not present

## 2014-11-18 DIAGNOSIS — E1122 Type 2 diabetes mellitus with diabetic chronic kidney disease: Secondary | ICD-10-CM | POA: Diagnosis not present

## 2014-11-18 DIAGNOSIS — E785 Hyperlipidemia, unspecified: Secondary | ICD-10-CM

## 2014-11-18 DIAGNOSIS — C679 Malignant neoplasm of bladder, unspecified: Secondary | ICD-10-CM | POA: Diagnosis not present

## 2014-11-18 DIAGNOSIS — N185 Chronic kidney disease, stage 5: Secondary | ICD-10-CM | POA: Diagnosis not present

## 2014-11-18 DIAGNOSIS — M1990 Unspecified osteoarthritis, unspecified site: Secondary | ICD-10-CM

## 2014-11-18 DIAGNOSIS — N189 Chronic kidney disease, unspecified: Secondary | ICD-10-CM

## 2014-11-18 DIAGNOSIS — I129 Hypertensive chronic kidney disease with stage 1 through stage 4 chronic kidney disease, or unspecified chronic kidney disease: Secondary | ICD-10-CM

## 2014-11-18 DIAGNOSIS — Z23 Encounter for immunization: Secondary | ICD-10-CM | POA: Diagnosis not present

## 2014-11-18 LAB — LIPID PANEL PICCOLO, WAIVED
CHOL/HDL RATIO PICCOLO,WAIVE: 3 mg/dL
Cholesterol Piccolo, Waived: 176 mg/dL (ref <200)
HDL CHOL PICCOLO, WAIVED: 59 mg/dL (ref >59)
LDL CHOL CALC PICCOLO WAIVED: 94 mg/dL (ref <100)
TRIGLYCERIDES PICCOLO,WAIVED: 114 mg/dL (ref <150)
VLDL CHOL CALC PICCOLO,WAIVE: 23 mg/dL (ref <30)

## 2014-11-18 LAB — MICROALBUMIN, URINE WAIVED
Creatinine, Urine Waived: 300 mg/dL (ref 10–300)
MICROALB, UR WAIVED: 80 mg/L — AB (ref 0–19)
Microalb/Creat Ratio: <30 mg/g (ref <30)

## 2014-11-18 LAB — BAYER DCA HB A1C WAIVED: HB A1C (BAYER DCA - WAIVED): 6.8 % (ref <7.0)

## 2014-11-18 MED ORDER — SITAGLIP PHOS-METFORMIN HCL ER 50-1000 MG PO TB24: 1.0000 | ORAL_TABLET | Freq: Every morning | ORAL | Status: DC

## 2014-11-18 MED ORDER — LOSARTAN POTASSIUM-HCTZ 50-12.5 MG PO TABS: 1.0000 | ORAL_TABLET | Freq: Every day | ORAL | Status: DC

## 2014-11-18 MED ORDER — SILDENAFIL CITRATE 100 MG PO TABS: 100.0000 mg | ORAL_TABLET | Freq: Every day | ORAL | Status: DC | PRN

## 2014-11-18 MED ORDER — AMLODIPINE BESYLATE 5 MG PO TABS: 5.0000 mg | ORAL_TABLET | Freq: Every day | ORAL | Status: DC

## 2014-11-18 NOTE — Assessment & Plan Note (Signed)
Microalbumin today 80 mg/L Comprehensive metabolic panel and Uric acid ordered results pending Hypertension controlled continue taking amlodipine, losartan-hydrochlorothiazide as prescribed

## 2014-11-18 NOTE — Assessment & Plan Note (Signed)
Controlled LDL today 94

## 2014-11-18 NOTE — Assessment & Plan Note (Signed)
Followed by Urology has a Cystoscopy scheduled Oct. 3, 2016

## 2014-11-18 NOTE — Progress Notes (Signed)
BP 137/73 mmHg  Pulse 62  Temp(Src) 98.3 F (36.8 C)  Ht 5' 11.7" (1.821 m)  Wt 214 lb (97.07 kg)  BMI 29.27 kg/m2  SpO2 96%   Subjective:    Patient ID: John [name], male    DOB: December 28, 1940, 74 y.o.   MRN: [id number]  HPI: John [name] is a 74 y.o. male  Chief Complaint  Patient presents with  . Diabetes  . Hyperlipidemia  . Hypertension   Diabetes This is a chronic problem medication compliance is excellent he is satisfied with the current treatment.  HbgA1C on 05/24/2014 was 6.8.  He does not check his blood sugars at home.  Pertinent negatives denies hypoglycemic events, chest pain, palpitations, polyuria, polydipsia, dizziness, or visual disturbances.  Hyperlipidemia This is a chronic problem he satisfied with current treatment medication compliance is excellent.  Pertinent negatives denies chest pain, palpitations, edema, or shortness of breath  Hypertension This is a chronic problem he does not take his blood pressure at home.  Medication compliance excellent and he is satisfied with current treatment.  Pertinent negatives denies chest pain, palpitations, edema, shortness of breath, or cough.  Arthritis This is a chronic problem he continues to c/o bilateral knee pain,, left shin pain and left hip pain.  He has seen his Orthopedic physician Dr. Anastasia Fiedler 2 weeks ago. X-rays were performed that were negative.  He takes otc Aleve and Ibuprofen daily with relief of symptoms.  Pertinent negatives denies numbness or weakness of bilateral lower extremities  Relevant past medical, surgical, family and social history reviewed and updated as indicated. Interim medical history since our last visit reviewed. Allergies and medications reviewed and updated.  Review of Systems  Constitutional: Negative.   Respiratory: Negative.   Cardiovascular: Negative.   Gastrointestinal: Negative for nausea, vomiting, abdominal pain, diarrhea and constipation.  Endocrine: Negative for  polydipsia, polyphagia and polyuria.  Musculoskeletal: Positive for arthralgias.  Skin: Negative for color change, pallor, rash and wound.  Neurological: Negative.     Per HPI unless specifically indicated above     Objective:    BP 137/73 mmHg  Pulse 62  Temp(Src) 98.3 F (36.8 C)  Ht 5' 11.7" (1.821 m)  Wt 214 lb (97.07 kg)  BMI 29.27 kg/m2  SpO2 96%  Wt Readings from Last 3 Encounters:  11/18/14 214 lb (97.07 kg)  05/24/14 212 lb (96.163 kg)    Physical Exam  Constitutional: He is oriented to person, place, and time. He appears well-developed and well-nourished. No distress.  HENT:  Head: Normocephalic and atraumatic.  Right Ear: External ear normal.  Left Ear: External ear normal.  Nose: Nose normal.  Neck: Normal range of motion. Neck supple.  Cardiovascular: Normal rate and regular rhythm.   Murmur heard. Pulmonary/Chest: No respiratory distress. He has no wheezes.  Musculoskeletal: Normal range of motion.  Neurological: He is alert and oriented to person, place, and time.  Skin: Skin is warm and dry. He is not diaphoretic.  Psychiatric: He has a normal mood and affect. His behavior is normal. Judgment and thought content normal.    Results for orders placed or performed in visit on 11/15/14  PSA  Result Value Ref Range   PSA from PP   HM COLONOSCOPY  Result Value Ref Range   HM Colonoscopy from PP       Assessment & Plan:   Problem List Items Addressed This Visit      Unprioritized   Bladder cancer    Followed by  Urology has a Cystoscopy scheduled Oct. 3, 2016      Hyperlipidemia - Primary    Controlled LDL today 94      Relevant Medications   amLODipine (NORVASC) 5 MG tablet   losartan-hydrochlorothiazide (HYZAAR) 50-12.5 MG per tablet   sildenafil (VIAGRA) 100 MG tablet   Other Relevant Orders   Lipid Panel Piccolo, Waived   Osteoarthritis    Pt declines referral to a new Orthopedic Md at this time Continue to take otc ibuprofen for pain  as needed      Hypertensive CKD (chronic kidney disease)    Microalbumin today 80 mg/L Comprehensive metabolic panel and Uric acid ordered results pending Hypertension controlled continue taking amlodipine, losartan-hydrochlorothiazide as prescribed      Relevant Orders   Comprehensive metabolic panel   Microalbumin, Urine Waived   Uric acid   Diabetes    Controlled HgbA1C 6.8 Continue taking Sitagliptin-Metformin HCL (Janumet XR) as prescribed Educated about carb modified diet      Relevant Medications   losartan-hydrochlorothiazide (HYZAAR) 50-12.5 MG per tablet   SitaGLIPtin-MetFORMIN HCl (JANUMET XR) 50-1000 MG TB24   Other Relevant Orders   Comprehensive metabolic panel   Bayer DCA Hb A1c Waived   Microalbumin, Urine Waived   Uric acid    Other Visit Diagnoses    Immunization due        Relevant Orders    Flu Vaccine QUAD 36+ mos PF IM (Fluarix & Fluzone Quad PF) (Completed)        Follow up plan: Return in about 6 months (around 05/18/2015).

## 2014-11-18 NOTE — Assessment & Plan Note (Signed)
Pt declines referral to a new Orthopedic Md at this time Continue to take otc ibuprofen for pain as needed

## 2014-11-18 NOTE — Assessment & Plan Note (Signed)
Controlled HgbA1C 6.8 Continue taking Sitagliptin-Metformin HCL (Janumet XR) as prescribed Educated about carb modified diet

## 2014-11-19 LAB — COMPREHENSIVE METABOLIC PANEL
ALK PHOS: 70 IU/L (ref 39–117)
ALT: 22 IU/L (ref 0–44)
AST: 23 IU/L (ref 0–40)
Albumin/Globulin Ratio: 1.6 (ref 1.1–2.5)
Albumin: 3.9 g/dL (ref 3.5–4.8)
BILIRUBIN TOTAL: 0.6 mg/dL (ref 0.0–1.2)
BUN/Creatinine Ratio: 19 (ref 10–22)
BUN: 18 mg/dL (ref 8–27)
CHLORIDE: 98 mmol/L (ref 97–108)
CO2: 28 mmol/L (ref 18–29)
Calcium: 8.9 mg/dL (ref 8.6–10.2)
Creatinine, Ser: 0.94 mg/dL (ref 0.76–1.27)
GFR calc Af Amer: 91 mL/min/{1.73_m2} (ref >59)
GFR calc non Af Amer: 78 mL/min/{1.73_m2} (ref >59)
GLUCOSE: 121 mg/dL — AB (ref 65–99)
Globulin, Total: 2.5 g/dL (ref 1.5–4.5)
Potassium: 3.5 mmol/L (ref 3.5–5.2)
Sodium: 143 mmol/L (ref 134–144)
TOTAL PROTEIN: 6.4 g/dL (ref 6.0–8.5)

## 2014-11-19 LAB — URIC ACID: URIC ACID: 4.2 mg/dL (ref 3.7–8.6)

## 2014-12-02 DIAGNOSIS — Z8551 Personal history of malignant neoplasm of bladder: Secondary | ICD-10-CM | POA: Diagnosis not present

## 2015-01-03 DIAGNOSIS — M170 Bilateral primary osteoarthritis of knee: Secondary | ICD-10-CM | POA: Diagnosis not present

## 2015-01-03 DIAGNOSIS — R262 Difficulty in walking, not elsewhere classified: Secondary | ICD-10-CM | POA: Diagnosis not present

## 2015-01-03 DIAGNOSIS — M1711 Unilateral primary osteoarthritis, right knee: Secondary | ICD-10-CM | POA: Diagnosis not present

## 2015-01-03 DIAGNOSIS — M25561 Pain in right knee: Secondary | ICD-10-CM | POA: Diagnosis not present

## 2015-01-03 DIAGNOSIS — M25562 Pain in left knee: Secondary | ICD-10-CM | POA: Diagnosis not present

## 2015-01-07 ENCOUNTER — Telehealth: Payer: Self-pay | Admitting: Unknown Physician Specialty

## 2015-01-07 MED ORDER — SILDENAFIL CITRATE 20 MG PO TABS
40.0000 mg | ORAL_TABLET | ORAL | Status: DC | PRN
Start: 2015-01-07 — End: 2016-01-01

## 2015-01-07 MED ORDER — SITAGLIP PHOS-METFORMIN HCL ER 50-1000 MG PO TB24: 1.0000 | ORAL_TABLET | Freq: Every morning | ORAL | Status: DC

## 2015-01-07 NOTE — Telephone Encounter (Signed)
Diabetic medication was changed to one pill a day and would like to 2 pills a day.  Would like a less expensive option to Viagra

## 2015-01-07 NOTE — Telephone Encounter (Signed)
Pt came in would like to see Malachy Mood, pt would like Malachy Mood to give her a call ASAP. Thanks.

## 2015-01-09 DIAGNOSIS — M25561 Pain in right knee: Secondary | ICD-10-CM | POA: Diagnosis not present

## 2015-01-09 DIAGNOSIS — M1711 Unilateral primary osteoarthritis, right knee: Secondary | ICD-10-CM | POA: Diagnosis not present

## 2015-01-15 DIAGNOSIS — M25561 Pain in right knee: Secondary | ICD-10-CM | POA: Diagnosis not present

## 2015-01-15 DIAGNOSIS — M1711 Unilateral primary osteoarthritis, right knee: Secondary | ICD-10-CM | POA: Diagnosis not present

## 2015-01-22 DIAGNOSIS — M25561 Pain in right knee: Secondary | ICD-10-CM | POA: Diagnosis not present

## 2015-01-22 DIAGNOSIS — M1711 Unilateral primary osteoarthritis, right knee: Secondary | ICD-10-CM | POA: Diagnosis not present

## 2015-01-27 DIAGNOSIS — M1711 Unilateral primary osteoarthritis, right knee: Secondary | ICD-10-CM | POA: Diagnosis not present

## 2015-01-27 DIAGNOSIS — M25561 Pain in right knee: Secondary | ICD-10-CM | POA: Diagnosis not present

## 2015-02-21 ENCOUNTER — Other Ambulatory Visit: Payer: Self-pay | Admitting: Unknown Physician Specialty

## 2015-04-30 DIAGNOSIS — M1711 Unilateral primary osteoarthritis, right knee: Secondary | ICD-10-CM | POA: Diagnosis not present

## 2015-04-30 DIAGNOSIS — M25561 Pain in right knee: Secondary | ICD-10-CM | POA: Diagnosis not present

## 2015-04-30 DIAGNOSIS — M170 Bilateral primary osteoarthritis of knee: Secondary | ICD-10-CM | POA: Diagnosis not present

## 2015-05-19 ENCOUNTER — Encounter: Payer: Self-pay | Admitting: Unknown Physician Specialty

## 2015-05-19 ENCOUNTER — Ambulatory Visit (INDEPENDENT_AMBULATORY_CARE_PROVIDER_SITE_OTHER): Payer: PPO | Admitting: Unknown Physician Specialty

## 2015-05-19 VITALS — BP 151/80 | HR 85 | Temp 98.7°F | Ht 70.3 in | Wt 208.6 lb

## 2015-05-19 DIAGNOSIS — N181 Chronic kidney disease, stage 1: Secondary | ICD-10-CM

## 2015-05-19 DIAGNOSIS — M791 Myalgia: Secondary | ICD-10-CM

## 2015-05-19 DIAGNOSIS — E1122 Type 2 diabetes mellitus with diabetic chronic kidney disease: Secondary | ICD-10-CM | POA: Diagnosis not present

## 2015-05-19 DIAGNOSIS — I10 Essential (primary) hypertension: Secondary | ICD-10-CM | POA: Diagnosis not present

## 2015-05-19 DIAGNOSIS — C679 Malignant neoplasm of bladder, unspecified: Secondary | ICD-10-CM | POA: Diagnosis not present

## 2015-05-19 DIAGNOSIS — R011 Cardiac murmur, unspecified: Secondary | ICD-10-CM | POA: Diagnosis not present

## 2015-05-19 DIAGNOSIS — M609 Myositis, unspecified: Secondary | ICD-10-CM

## 2015-05-19 DIAGNOSIS — E785 Hyperlipidemia, unspecified: Secondary | ICD-10-CM

## 2015-05-19 DIAGNOSIS — R5383 Other fatigue: Secondary | ICD-10-CM

## 2015-05-19 DIAGNOSIS — IMO0001 Reserved for inherently not codable concepts without codable children: Secondary | ICD-10-CM

## 2015-05-19 LAB — BAYER DCA HB A1C WAIVED: HB A1C: 6.7 % (ref <7.0)

## 2015-05-19 LAB — LIPID PANEL PICCOLO, WAIVED
Chol/HDL Ratio Piccolo,Waive: 3 mg/dL
Cholesterol Piccolo, Waived: 192 mg/dL (ref <200)
HDL Chol Piccolo, Waived: 64 mg/dL (ref >59)
LDL Chol Calc Piccolo Waived: 106 mg/dL — ABNORMAL HIGH (ref <100)
Triglycerides Piccolo,Waived: 106 mg/dL (ref <150)
VLDL Chol Calc Piccolo,Waive: 21 mg/dL (ref <30)

## 2015-05-19 NOTE — Assessment & Plan Note (Signed)
Hgb A1C is 6.7 and at goal

## 2015-05-19 NOTE — Assessment & Plan Note (Addendum)
Stage 3 heart murmer best heard over right 2nd intercostal space and left 5 intercostal space.  Last EKG on Care Everywhere was 2015 and normal.  Pt doesn't want another as he feels he gets them frequently at [facility] but willing to get an echocardiogram.

## 2015-05-19 NOTE — Patient Instructions (Addendum)
Try taking Magnesium supplements for leg cramps.  Start with 250 mg twice a day and you can increase to 500 mg twice a day.  I prefer Magnesium Citrate ([employer] or [employer]) rather than the more common Magnesium Oxate which is less well absorbed.    Tonic water may also help.

## 2015-05-19 NOTE — Progress Notes (Signed)
BP 151/80 mmHg  Pulse 85  Temp(Src) 98.7 F (37.1 C)  Ht 5' 10.3" (1.786 m)  Wt 208 lb 9.6 oz (94.62 kg)  BMI 29.66 kg/m2  SpO2 95%   Subjective:    Patient ID: John [name], male    DOB: [birth year], 75 y.o.   MRN: [id number]  HPI: John [name] is a 75 y.o. male  Chief Complaint  Patient presents with  . Diabetes  . Hyperlipidemia  . Hypertension   Diabetes:  Using medications without difficulties No hypoglycemic episodes No hyperglycemic episodes Feet problems: none Blood Sugars averaging: Not checking eye exam within last year  Hypertension  Using medications without difficulty Average home BPs Doesn't check  Using medication without problems or lightheadedness No chest pain with exertion or shortness of breath No Edema  Elevated Cholesterol: Not taking due to muscle side effects Diet compliance: Not watching what he eats Exercise: Not exercising and complaining of no energy      Relevant past medical, surgical, family and social history reviewed and updated as indicated. Interim medical history since our last visit reviewed. Allergies and medications reviewed and updated.  Review of Systems  Constitutional: Positive for activity change.       More lethargic and tired.    HENT: Negative.   Respiratory: Negative for shortness of breath.   Cardiovascular: Negative for chest pain and leg swelling.  Gastrointestinal: Negative.   Musculoskeletal: Positive for arthralgias.  Psychiatric/Behavioral: Negative.     Per HPI unless specifically indicated above     Objective:    BP 151/80 mmHg  Pulse 85  Temp(Src) 98.7 F (37.1 C)  Ht 5' 10.3" (1.786 m)  Wt 208 lb 9.6 oz (94.62 kg)  BMI 29.66 kg/m2  SpO2 95%  Wt Readings from Last 3 Encounters:  05/19/15 208 lb 9.6 oz (94.62 kg)  11/18/14 214 lb (97.07 kg)  05/24/14 212 lb (96.163 kg)    Physical Exam  Constitutional: He is oriented to person, place, and time. He appears well-developed and  well-nourished. No distress.  HENT:  Head: Normocephalic and atraumatic.  Eyes: Conjunctivae and lids are normal. Right eye exhibits no discharge. Left eye exhibits no discharge. No scleral icterus.  Neck: Normal range of motion. Neck supple. No JVD present. Carotid bruit is not present.  Cardiovascular: Normal rate and regular rhythm.   Murmur heard.  Crescendo systolic murmur is present with a grade of 3/6  Pulmonary/Chest: Effort normal and breath sounds normal. No respiratory distress.  Abdominal: Normal appearance. There is no splenomegaly or hepatomegaly.  Musculoskeletal: Normal range of motion.  Neurological: He is alert and oriented to person, place, and time.  Skin: Skin is warm, dry and intact. No rash noted. No pallor.  Psychiatric: He has a normal mood and affect. His behavior is normal. Judgment and thought content normal.    Results for orders placed or performed in visit on 11/18/14  Comprehensive metabolic panel  Result Value Ref Range   Glucose 121 (H) 65 - 99 mg/dL   BUN 18 8 - 27 mg/dL   Creatinine, Ser 0.94 0.76 - 1.27 mg/dL   GFR calc non Af Amer 78 >59 mL/min/1.73   GFR calc Af Amer 91 >59 mL/min/1.73   BUN/Creatinine Ratio 19 10 - 22   Sodium 143 134 - 144 mmol/L   Potassium 3.5 3.5 - 5.2 mmol/L   Chloride 98 97 - 108 mmol/L   CO2 28 18 - 29 mmol/L   Calcium 8.9 8.6 - 10.2  mg/dL   Total Protein 6.4 6.0 - 8.5 g/dL   Albumin 3.9 3.5 - 4.8 g/dL   Globulin, Total 2.5 1.5 - 4.5 g/dL   Albumin/Globulin Ratio 1.6 1.1 - 2.5   Bilirubin Total 0.6 0.0 - 1.2 mg/dL   Alkaline Phosphatase 70 39 - 117 IU/L   AST 23 0 - 40 IU/L   ALT 22 0 - 44 IU/L  Bayer DCA Hb A1c Waived  Result Value Ref Range   Bayer DCA Hb A1c Waived 6.8 <7.0 %  Microalbumin, Urine Waived  Result Value Ref Range   Microalb, Ur Waived 80 (H) 0 - 19 mg/L   Creatinine, Urine Waived 300 10 - 300 mg/dL   Microalb/Creat Ratio <30 <30 mg/g  Uric acid  Result Value Ref Range   Uric Acid 4.2 3.7 -  8.6 mg/dL  Lipid Panel Piccolo, Waived  Result Value Ref Range   Cholesterol Piccolo, Waived 176 <200 mg/dL   HDL Chol Piccolo, Waived 59 >59 mg/dL   Triglycerides Piccolo,Waived 114 <150 mg/dL   Chol/HDL Ratio Piccolo,Waive 3.0 mg/dL   LDL Chol Calc Piccolo Waived 94 <100 mg/dL   VLDL Chol Calc Piccolo,Waive 23 <30 mg/dL      Assessment & Plan:   Problem List Items Addressed This Visit      Unprioritized   Bladder cancer ([place])    Through [facility]      Hyperlipidemia    Unwiling to take statin.  LDL is 106      Relevant Orders   Lipid Panel Piccolo, Waived   Hypertension    Stable, continue present medications.        Heart murmur    Stage 3 heart murmer best heard over right 2nd intercostal space and left 5 intercostal space.  Last EKG on Care Everywhere was 2015 and normal.  Pt doesn't want another as he feels he gets them frequently at [facility] but willing to get an echocardiogram.        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

## 2015-05-19 NOTE — Assessment & Plan Note (Signed)
Through [facility]

## 2015-05-19 NOTE — Assessment & Plan Note (Signed)
Stable, continue present medications.   

## 2015-05-19 NOTE — Assessment & Plan Note (Signed)
Unwiling to take statin.  LDL is 106

## 2015-05-20 ENCOUNTER — Encounter: Payer: Self-pay | Admitting: Unknown Physician Specialty

## 2015-05-20 LAB — COMPREHENSIVE METABOLIC PANEL
A/G RATIO: 1.6 (ref 1.2–2.2)
ALT: 32 IU/L (ref 0–44)
AST: 35 IU/L (ref 0–40)
Albumin: 4.1 g/dL (ref 3.5–4.8)
Alkaline Phosphatase: 72 IU/L (ref 39–117)
BUN / CREAT RATIO: 13 (ref 10–22)
BUN: 14 mg/dL (ref 8–27)
Bilirubin Total: 0.5 mg/dL (ref 0.0–1.2)
CALCIUM: 9.1 mg/dL (ref 8.6–10.2)
CHLORIDE: 99 mmol/L (ref 96–106)
CO2: 26 mmol/L (ref 18–29)
Creatinine, Ser: 1.09 mg/dL (ref 0.76–1.27)
GFR calc Af Amer: 75 mL/min/{1.73_m2} (ref >59)
GFR, EST NON AFRICAN AMERICAN: 65 mL/min/{1.73_m2} (ref >59)
GLOBULIN, TOTAL: 2.5 g/dL (ref 1.5–4.5)
Glucose: 138 mg/dL — ABNORMAL HIGH (ref 65–99)
Potassium: 3.9 mmol/L (ref 3.5–5.2)
SODIUM: 141 mmol/L (ref 134–144)
Total Protein: 6.6 g/dL (ref 6.0–8.5)

## 2015-05-20 LAB — VITAMIN B12: Vitamin B-12: 312 pg/mL (ref 211–946)

## 2015-05-20 LAB — VITAMIN D 25 HYDROXY (VIT D DEFICIENCY, FRACTURES): VIT D 25 HYDROXY: 19.7 ng/mL — AB (ref 30.0–100.0)

## 2015-05-27 ENCOUNTER — Ambulatory Visit
Admission: RE | Admit: 2015-05-27 | Discharge: 2015-05-27 | Disposition: A | Discharge status: Home / Self Care | Payer: PPO | Source: Ambulatory Visit | Attending: Unknown Physician Specialty | Admitting: Unknown Physician Specialty

## 2015-05-27 DIAGNOSIS — E785 Hyperlipidemia, unspecified: Secondary | ICD-10-CM | POA: Insufficient documentation

## 2015-05-27 DIAGNOSIS — R011 Cardiac murmur, unspecified: Secondary | ICD-10-CM | POA: Diagnosis not present

## 2015-05-27 DIAGNOSIS — N189 Chronic kidney disease, unspecified: Secondary | ICD-10-CM | POA: Insufficient documentation

## 2015-05-27 DIAGNOSIS — I129 Hypertensive chronic kidney disease with stage 1 through stage 4 chronic kidney disease, or unspecified chronic kidney disease: Secondary | ICD-10-CM | POA: Insufficient documentation

## 2015-05-27 DIAGNOSIS — E1122 Type 2 diabetes mellitus with diabetic chronic kidney disease: Secondary | ICD-10-CM | POA: Diagnosis not present

## 2015-05-27 DIAGNOSIS — I340 Nonrheumatic mitral (valve) insufficiency: Secondary | ICD-10-CM | POA: Diagnosis not present

## 2015-05-27 DIAGNOSIS — I350 Nonrheumatic aortic (valve) stenosis: Secondary | ICD-10-CM | POA: Insufficient documentation

## 2015-05-27 NOTE — Progress Notes (Signed)
*  PRELIMINARY RESULTS* Echocardiogram 2D Echocardiogram has been performed.  John [name] 05/27/2015, 10:44 AM

## 2015-05-30 ENCOUNTER — Telehealth: Payer: Self-pay | Admitting: Unknown Physician Specialty

## 2015-05-30 ENCOUNTER — Encounter: Payer: Self-pay | Admitting: Unknown Physician Specialty

## 2015-05-30 DIAGNOSIS — R011 Cardiac murmur, unspecified: Secondary | ICD-10-CM

## 2015-05-30 DIAGNOSIS — I350 Nonrheumatic aortic (valve) stenosis: Secondary | ICD-10-CM | POA: Insufficient documentation

## 2015-05-30 NOTE — Telephone Encounter (Signed)
Discussed with pt aortic stenosis on echo.  Will refer to cardiology for regular monitoring.

## 2015-06-04 ENCOUNTER — Telehealth: Payer: Self-pay

## 2015-06-04 DIAGNOSIS — E119 Type 2 diabetes mellitus without complications: Secondary | ICD-10-CM | POA: Diagnosis not present

## 2015-06-04 LAB — HM DIABETES EYE EXAM

## 2015-06-04 NOTE — Telephone Encounter (Signed)
Patient notified

## 2015-06-04 NOTE — Telephone Encounter (Signed)
Tried calling patient to tell him of his Cardiology appointment.  No answer, left message for patient to return phone call.  Appointment is Friday 06/06/2015 at 1:00 with Dr. Saralyn Pilar at [facility].

## 2015-06-09 DIAGNOSIS — I350 Nonrheumatic aortic (valve) stenosis: Secondary | ICD-10-CM | POA: Diagnosis not present

## 2015-06-09 DIAGNOSIS — I10 Essential (primary) hypertension: Secondary | ICD-10-CM | POA: Diagnosis not present

## 2015-06-09 DIAGNOSIS — E119 Type 2 diabetes mellitus without complications: Secondary | ICD-10-CM | POA: Diagnosis not present

## 2015-06-12 ENCOUNTER — Telehealth: Payer: Self-pay | Admitting: Unknown Physician Specialty

## 2015-06-12 NOTE — Telephone Encounter (Signed)
Pt has a dentist appt to get tooth pulled and his dentist would like to know if he needed an antibiotic prior to his appt.

## 2015-06-12 NOTE — Telephone Encounter (Signed)
Patient notified

## 2015-06-12 NOTE — Telephone Encounter (Signed)
Routing to provider  

## 2015-06-12 NOTE — Telephone Encounter (Signed)
No

## 2015-08-11 DIAGNOSIS — M25561 Pain in right knee: Secondary | ICD-10-CM | POA: Diagnosis not present

## 2015-08-11 DIAGNOSIS — R262 Difficulty in walking, not elsewhere classified: Secondary | ICD-10-CM | POA: Diagnosis not present

## 2015-08-11 DIAGNOSIS — M1711 Unilateral primary osteoarthritis, right knee: Secondary | ICD-10-CM | POA: Diagnosis not present

## 2015-08-11 DIAGNOSIS — M170 Bilateral primary osteoarthritis of knee: Secondary | ICD-10-CM | POA: Diagnosis not present

## 2015-08-25 DIAGNOSIS — Z09 Encounter for follow-up examination after completed treatment for conditions other than malignant neoplasm: Secondary | ICD-10-CM | POA: Diagnosis not present

## 2015-08-25 DIAGNOSIS — Z8551 Personal history of malignant neoplasm of bladder: Secondary | ICD-10-CM | POA: Diagnosis not present

## 2015-11-17 DIAGNOSIS — E118 Type 2 diabetes mellitus with unspecified complications: Secondary | ICD-10-CM | POA: Diagnosis not present

## 2015-11-17 DIAGNOSIS — M25561 Pain in right knee: Secondary | ICD-10-CM | POA: Diagnosis not present

## 2015-11-19 DIAGNOSIS — D485 Neoplasm of uncertain behavior of skin: Secondary | ICD-10-CM | POA: Diagnosis not present

## 2015-11-19 DIAGNOSIS — L570 Actinic keratosis: Secondary | ICD-10-CM | POA: Diagnosis not present

## 2015-11-19 DIAGNOSIS — L578 Other skin changes due to chronic exposure to nonionizing radiation: Secondary | ICD-10-CM | POA: Diagnosis not present

## 2015-11-20 DIAGNOSIS — L570 Actinic keratosis: Secondary | ICD-10-CM | POA: Diagnosis not present

## 2015-11-25 ENCOUNTER — Encounter: Payer: Self-pay | Admitting: Unknown Physician Specialty

## 2015-11-25 ENCOUNTER — Other Ambulatory Visit: Payer: Self-pay

## 2015-11-25 ENCOUNTER — Ambulatory Visit (INDEPENDENT_AMBULATORY_CARE_PROVIDER_SITE_OTHER): Payer: PPO | Admitting: Unknown Physician Specialty

## 2015-11-25 VITALS — BP 147/70 | HR 75 | Temp 97.9°F | Ht 70.3 in | Wt 209.8 lb

## 2015-11-25 DIAGNOSIS — E1122 Type 2 diabetes mellitus with diabetic chronic kidney disease: Secondary | ICD-10-CM | POA: Diagnosis not present

## 2015-11-25 DIAGNOSIS — I350 Nonrheumatic aortic (valve) stenosis: Secondary | ICD-10-CM | POA: Diagnosis not present

## 2015-11-25 DIAGNOSIS — Z01818 Encounter for other preprocedural examination: Secondary | ICD-10-CM

## 2015-11-25 DIAGNOSIS — I10 Essential (primary) hypertension: Secondary | ICD-10-CM

## 2015-11-25 DIAGNOSIS — Z23 Encounter for immunization: Secondary | ICD-10-CM

## 2015-11-25 DIAGNOSIS — N181 Chronic kidney disease, stage 1: Secondary | ICD-10-CM | POA: Diagnosis not present

## 2015-11-25 DIAGNOSIS — Z0000 Encounter for general adult medical examination without abnormal findings: Secondary | ICD-10-CM

## 2015-11-25 DIAGNOSIS — E785 Hyperlipidemia, unspecified: Secondary | ICD-10-CM | POA: Diagnosis not present

## 2015-11-25 LAB — MICROALBUMIN, URINE WAIVED
Creatinine, Urine Waived: 200 mg/dL (ref 10–300)
MICROALB, UR WAIVED: 80 mg/L — AB (ref 0–19)
Microalb/Creat Ratio: <30 mg/g (ref <30)

## 2015-11-25 LAB — HEMOGLOBIN A1C: Hemoglobin A1C: 6.6

## 2015-11-25 LAB — BAYER DCA HB A1C WAIVED: HB A1C: 6.6 % (ref <7.0)

## 2015-11-25 MED ORDER — LOSARTAN POTASSIUM-HCTZ 50-12.5 MG PO TABS: 1.0000 | ORAL_TABLET | Freq: Every day | ORAL | 3 refills | Status: DC

## 2015-11-25 MED ORDER — SITAGLIP PHOS-METFORMIN HCL ER 50-1000 MG PO TB24: 1.0000 | ORAL_TABLET | Freq: Two times a day (BID) | ORAL | 1 refills | Status: DC

## 2015-11-25 MED ORDER — AMLODIPINE BESYLATE 5 MG PO TABS: 5.0000 mg | ORAL_TABLET | Freq: Every day | ORAL | 3 refills | Status: DC

## 2015-11-25 NOTE — Assessment & Plan Note (Signed)
Hgb A1C 6.7 Stable, continue present medications.

## 2015-11-25 NOTE — Progress Notes (Signed)
b+-+--------  BP (!) 147/70 (BP Location: Left Arm)   Pulse 75   Temp 97.9 F (36.6 C)   Ht 5' 10.3" (1.786 m)   Wt 209 lb 12.8 oz (95.2 kg)   SpO2 96%   BMI 29.85 kg/m    Subjective:    Patient ID: John [name], male    DOB: June 26, 1940, 75 y.o.   MRN: [id number]  HPI: John [name] is a 75 y.o. male  Chief Complaint  Patient presents with  . Medicare Wellness   Diabetes:  Using medications without difficulties No hypoglycemic episodes No hyperglycemic episodes Feet problems: none Blood Sugars averaging: Not checking eye exam within last year  Hypertension  Using medications without difficulty Average home BPs usually 130's/60's      Using medication without problems or lightheadedness No chest pain with exertion or shortness of breath No Edema   Clearance for surgery Pt needs clearance for a total knee replacement.  No history of problems with anesthesia.  He has aortic valve stenosis.  He is seeing Dr. Josefa Half who feels this is stable.  Note reviewed.   Past Surgical History:  Procedure Laterality Date  . BLADDER SURGERY    . JOINT REPLACEMENT     hip  . SKIN SURGERY Left    arm    Elevated Cholesterol: Not taking due to muscle side effects Diet compliance: Not watching what he eats Exercise: Not exercising and complaining of no energy  Functional Status Survey: Is the patient deaf or have difficulty hearing?: No Does the patient have difficulty seeing, even when wearing glasses/contacts?: No Does the patient have difficulty concentrating, remembering, or making decisions?: No Does the patient have difficulty walking or climbing stairs?: Yes Does the patient have difficulty dressing or bathing?: No Does the patient have difficulty doing errands alone such as visiting a doctor's office or shopping?: No  Fall Risk  11/25/2015 05/19/2015 11/18/2014  Falls in the past year? No No No   Depression screen [facility] 2/9 11/25/2015 05/19/2015  Decreased Interest 2 0    Down, Depressed, Hopeless 0 0  PHQ - 2 Score 2 0  Altered sleeping 1 -  Tired, decreased energy 1 -  Change in appetite 0 -  Feeling bad or failure about yourself  0 -  Trouble concentrating 0 -  Moving slowly or fidgety/restless 0 -  Suicidal thoughts 0 -  PHQ-9 Score 4 -    Relevant past medical, surgical, family and social history reviewed and updated as indicated. Interim medical history since our last visit reviewed. Allergies and medications reviewed and updated.  Review of Systems  Per HPI unless specifically indicated above     Objective:    BP (!) 147/70 (BP Location: Left Arm)   Pulse 75   Temp 97.9 F (36.6 C)   Ht 5' 10.3" (1.786 m)   Wt 209 lb 12.8 oz (95.2 kg)   SpO2 96%   BMI 29.85 kg/m   Wt Readings from Last 3 Encounters:  11/25/15 209 lb 12.8 oz (95.2 kg)  05/19/15 208 lb 9.6 oz (94.6 kg)  11/18/14 214 lb (97.1 kg)    Physical Exam  Constitutional: He is oriented to person, place, and time. He appears well-developed and well-nourished.  HENT:  Head: Normocephalic.  Right Ear: Tympanic membrane, external ear and ear canal normal.  Left Ear: Tympanic membrane, external ear and ear canal normal.  Mouth/Throat: Uvula is midline, oropharynx is clear and moist and mucous membranes are normal.  Eyes: Pupils  are equal, round, and reactive to light.  Cardiovascular: Normal rate and regular rhythm.  Exam reveals no gallop and no friction rub.   Murmur heard.  Systolic murmur is present with a grade of 3/6  Pulmonary/Chest: Effort normal and breath sounds normal. No respiratory distress.  Abdominal: Soft. Bowel sounds are normal. He exhibits no distension. There is no tenderness.  Musculoskeletal: Normal range of motion.  Neurological: He is alert and oriented to person, place, and time. He has normal reflexes.  Skin: Skin is warm and dry.  Psychiatric: He has a normal mood and affect. His behavior is normal. Judgment and thought content normal.   EKG  without acute changes  Results for orders placed or performed in visit on 05/19/15  Bayer DCA Hb A1c Waived  Result Value Ref Range   Bayer DCA Hb A1c Waived 6.7 <7.0 %  Lipid Panel Piccolo, Waived  Result Value Ref Range   Cholesterol Piccolo, Waived 192 <200 mg/dL   HDL Chol Piccolo, Waived 64 >59 mg/dL   Triglycerides Piccolo,Waived 106 <150 mg/dL   Chol/HDL Ratio Piccolo,Waive 3.0 mg/dL   LDL Chol Calc Piccolo Waived 106 (H) <100 mg/dL   VLDL Chol Calc Piccolo,Waive 21 <30 mg/dL  Comprehensive metabolic panel  Result Value Ref Range   Glucose 138 (H) 65 - 99 mg/dL   BUN 14 8 - 27 mg/dL   Creatinine, Ser 1.09 0.76 - 1.27 mg/dL   GFR calc non Af Amer 65 >59 mL/min/1.73   GFR calc Af Amer 75 >59 mL/min/1.73   BUN/Creatinine Ratio 13 10 - 22   Sodium 141 134 - 144 mmol/L   Potassium 3.9 3.5 - 5.2 mmol/L   Chloride 99 96 - 106 mmol/L   CO2 26 18 - 29 mmol/L   Calcium 9.1 8.6 - 10.2 mg/dL   Total Protein 6.6 6.0 - 8.5 g/dL   Albumin 4.1 3.5 - 4.8 g/dL   Globulin, Total 2.5 1.5 - 4.5 g/dL   Albumin/Globulin Ratio 1.6 1.2 - 2.2   Bilirubin Total 0.5 0.0 - 1.2 mg/dL   Alkaline Phosphatase 72 39 - 117 IU/L   AST 35 0 - 40 IU/L   ALT 32 0 - 44 IU/L  VITAMIN D 25 Hydroxy (Vit-D Deficiency, Fractures)  Result Value Ref Range   Vit D, 25-Hydroxy 19.7 (L) 30.0 - 100.0 ng/mL  Vitamin B12  Result Value Ref Range   Vitamin B-12 312 211 - 946 pg/mL      Assessment & Plan:   Problem List Items Addressed This Visit      Unprioritized   Aortic valve stenosis, moderate    Stable      Relevant Medications   amLODipine (NORVASC) 5 MG tablet   losartan-hydrochlorothiazide (HYZAAR) 50-12.5 MG tablet   Hyperlipidemia    Refusing regular statin use but willing to take before and after surgery for a period of time due to data indicating improved survivability      Relevant Medications   amLODipine (NORVASC) 5 MG tablet   losartan-hydrochlorothiazide (HYZAAR) 50-12.5 MG tablet    Other Relevant Orders   Lipid Panel w/o Chol/HDL Ratio   Hypertension   Relevant Medications   amLODipine (NORVASC) 5 MG tablet   losartan-hydrochlorothiazide (HYZAAR) 50-12.5 MG tablet   Other Relevant Orders   Comprehensive metabolic panel   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        Relevant Medications  SitaGLIPtin-MetFORMIN HCl (JANUMET XR) 50-1000 MG TB24   losartan-hydrochlorothiazide (HYZAAR) 50-12.5 MG tablet   Other Relevant Orders   Comprehensive metabolic panel   Bayer DCA Hb A1c Waived   Microalbumin, Urine Waived    Other Visit Diagnoses    Need for influenza vaccination    -  Primary   Relevant Orders   Flu vaccine HIGH DOSE PF (Completed)   Pre-operative clearance       OK for surger   Relevant Orders   EKG 12-Lead (Completed)   Annual physical exam           Follow up plan: Return in about 6 months (around 05/24/2016).

## 2015-11-25 NOTE — Assessment & Plan Note (Signed)
Stable

## 2015-11-25 NOTE — Patient Instructions (Signed)

## 2015-11-25 NOTE — Assessment & Plan Note (Signed)
Refusing regular statin use but willing to take before and after surgery for a period of time due to data indicating improved survivability

## 2015-11-26 ENCOUNTER — Encounter: Payer: Self-pay | Admitting: Unknown Physician Specialty

## 2015-11-26 LAB — COMPREHENSIVE METABOLIC PANEL
ALK PHOS: 68 IU/L (ref 39–117)
ALT: 22 IU/L (ref 0–44)
AST: 25 IU/L (ref 0–40)
Albumin/Globulin Ratio: 1.6 (ref 1.2–2.2)
Albumin: 4.1 g/dL (ref 3.5–4.8)
BUN/Creatinine Ratio: 19 (ref 10–24)
BUN: 20 mg/dL (ref 8–27)
Bilirubin Total: 0.7 mg/dL (ref 0.0–1.2)
CO2: 30 mmol/L — AB (ref 18–29)
CREATININE: 1.03 mg/dL (ref 0.76–1.27)
Calcium: 9.3 mg/dL (ref 8.6–10.2)
Chloride: 100 mmol/L (ref 96–106)
GFR calc Af Amer: 81 mL/min/{1.73_m2} (ref >59)
GFR calc non Af Amer: 70 mL/min/{1.73_m2} (ref >59)
GLUCOSE: 136 mg/dL — AB (ref 65–99)
Globulin, Total: 2.6 g/dL (ref 1.5–4.5)
Potassium: 3.8 mmol/L (ref 3.5–5.2)
SODIUM: 143 mmol/L (ref 134–144)
Total Protein: 6.7 g/dL (ref 6.0–8.5)

## 2015-11-26 LAB — LIPID PANEL W/O CHOL/HDL RATIO
CHOLESTEROL TOTAL: 179 mg/dL (ref 100–199)
HDL: 58 mg/dL (ref >39)
LDL CALC: 103 mg/dL — AB (ref 0–99)
TRIGLYCERIDES: 91 mg/dL (ref 0–149)
VLDL CHOLESTEROL CAL: 18 mg/dL (ref 5–40)

## 2015-12-09 DIAGNOSIS — I10 Essential (primary) hypertension: Secondary | ICD-10-CM | POA: Diagnosis not present

## 2015-12-09 DIAGNOSIS — E119 Type 2 diabetes mellitus without complications: Secondary | ICD-10-CM | POA: Diagnosis not present

## 2015-12-09 DIAGNOSIS — I350 Nonrheumatic aortic (valve) stenosis: Secondary | ICD-10-CM | POA: Diagnosis not present

## 2015-12-09 DIAGNOSIS — Z01810 Encounter for preprocedural cardiovascular examination: Secondary | ICD-10-CM | POA: Diagnosis not present

## 2015-12-09 DIAGNOSIS — E7800 Pure hypercholesterolemia, unspecified: Secondary | ICD-10-CM | POA: Diagnosis not present

## 2015-12-25 ENCOUNTER — Ambulatory Visit: Payer: Self-pay | Admitting: Orthopedic Surgery

## 2015-12-25 DIAGNOSIS — L570 Actinic keratosis: Secondary | ICD-10-CM | POA: Diagnosis not present

## 2015-12-30 DIAGNOSIS — L578 Other skin changes due to chronic exposure to nonionizing radiation: Secondary | ICD-10-CM | POA: Diagnosis not present

## 2015-12-30 DIAGNOSIS — L570 Actinic keratosis: Secondary | ICD-10-CM | POA: Diagnosis not present

## 2015-12-31 ENCOUNTER — Ambulatory Visit: Payer: Self-pay | Admitting: Orthopedic Surgery

## 2015-12-31 NOTE — H&P (Signed)
TOTAL KNEE ADMISSION H&P  Patient is being admitted for right total knee arthroplasty.  Subjective:  Chief Complaint:right knee pain.  HPI: John [name], 75 y.o. male, has a history of pain and functional disability in the right knee due to arthritis and has failed non-surgical conservative treatments for greater than 12 weeks to includeNSAID's and/or analgesics, corticosteriod injections, viscosupplementation injections, flexibility and strengthening excercises, use of assistive devices, weight reduction as appropriate and activity modification.  Onset of symptoms was gradual, starting >10 years ago with gradually worsening course since that time. The patient noted no past surgery on the right knee(s).  Patient currently rates pain in the right knee(s) at 10 out of 10 with activity. Patient has night pain, worsening of pain with activity and weight bearing, pain that interferes with activities of daily living, pain with passive range of motion, crepitus and joint swelling.  Patient has evidence of subchondral cysts, subchondral sclerosis, periarticular osteophytes and joint space narrowing by imaging studies. There is no active infection.  Patient Active Problem List   Diagnosis Date Noted  . Aortic valve stenosis, moderate 05/30/2015  . Bladder cancer ([place]) 11/15/2014  . Hyperlipidemia 11/15/2014  . ED (erectile dysfunction) 11/15/2014  . Osteoarthritis 11/15/2014  . Hypertension 11/15/2014  . Heart murmur 11/15/2014  . 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 Anemia 11/15/2014   Past Medical History:  Diagnosis Date  . Anemia   . Cancer ([place])    bladder  . Chronic kidney disease   . Diabetes mellitus without complication ([place])   . ED (erectile dysfunction)   . Heart murmur   . Hyperlipidemia   . Hypertension   . Osteoarthritis     Past Surgical History:  Procedure Laterality Date  . BLADDER SURGERY    . JOINT REPLACEMENT     hip  .  SKIN SURGERY Left    arm     (Not in a hospital admission) Allergies  Allergen Reactions  . Aspirin Other (See Comments)    bruising    Social History  Substance Use Topics  . Smoking status: Former Smoker    Quit date: 12/15/1980  . Smokeless tobacco: Never Used  . Alcohol use No    Family History  Problem Relation Age of Onset  . Dementia Mother   . Stroke Father   . Diabetes Father   . Heart disease Father   . Cancer Sister   . Hypertension Brother   . Cancer Sister      Review of Systems  Constitutional: Negative.   HENT: Positive for tinnitus.   Eyes: Negative.   Respiratory: Positive for shortness of breath.   Cardiovascular: Negative.   Gastrointestinal: Negative.   Genitourinary: Negative.   Musculoskeletal: Positive for joint pain.  Skin: Negative.   Neurological: Negative.   Endo/Heme/Allergies: Negative.   Psychiatric/Behavioral: Negative.     Objective:  Physical Exam  Vitals reviewed. Constitutional: He is oriented to person, place, and time. He appears well-developed and well-nourished.  HENT:  Head: Normocephalic and atraumatic.  Eyes: Conjunctivae are normal. Pupils are equal, round, and reactive to light.  Neck: Normal range of motion.  Cardiovascular: Normal rate, regular rhythm and intact distal pulses.   Respiratory: Effort normal and breath sounds normal.  GI: Soft. He exhibits no distension.  Genitourinary:  Genitourinary Comments: deferred  Musculoskeletal:       Right knee: He exhibits decreased range of motion, swelling and abnormal alignment. Tenderness found. Medial joint  line and lateral joint line tenderness noted.  Neurological: He is alert and oriented to person, place, and time. He has normal reflexes.  Skin: Skin is warm and dry.  Psychiatric: He has a normal mood and affect. His behavior is normal. Judgment and thought content normal.    Vital signs in last 24 hours: @VSRANGES @  Labs:   Estimated body mass index  is 29.85 kg/m as calculated from the following:   Height as of 11/25/15: 5' 10.3" (1.786 m).   Weight as of 11/25/15: 95.2 kg (209 lb 12.8 oz).   Imaging Review Plain radiographs demonstrate severe degenerative joint disease of the right knee(s). The overall alignment issignificant varus. The bone quality appears to be adequate for age and reported activity level.  Assessment/Plan:  End stage arthritis, right knee   The patient history, physical examination, clinical judgment of the provider and imaging studies are consistent with end stage degenerative joint disease of the right knee(s) and total knee arthroplasty is deemed medically necessary. The treatment options including medical management, injection therapy arthroscopy and arthroplasty were discussed at length. The risks and benefits of total knee arthroplasty were presented and reviewed. The risks due to aseptic loosening, infection, stiffness, patella tracking problems, thromboembolic complications and other imponderables were discussed. The patient acknowledged the explanation, agreed to proceed with the plan and consent was signed. Patient is being admitted for inpatient treatment for surgery, pain control, PT, OT, prophylactic antibiotics, VTE prophylaxis, progressive ambulation and ADL's and discharge planning. The patient is planning to be discharged home with home health services

## 2016-01-05 ENCOUNTER — Encounter (HOSPITAL_COMMUNITY)
Admission: RE | Admit: 2016-01-05 | Discharge: 2016-01-05 | Disposition: A | Discharge status: Home / Self Care | Payer: PPO | Source: Ambulatory Visit | Attending: Orthopedic Surgery | Admitting: Orthopedic Surgery

## 2016-01-05 ENCOUNTER — Encounter (HOSPITAL_COMMUNITY): Payer: Self-pay

## 2016-01-05 DIAGNOSIS — M1711 Unilateral primary osteoarthritis, right knee: Secondary | ICD-10-CM | POA: Diagnosis not present

## 2016-01-05 DIAGNOSIS — Z01812 Encounter for preprocedural laboratory examination: Secondary | ICD-10-CM | POA: Diagnosis not present

## 2016-01-05 HISTORY — DX: Dyspnea, unspecified: R06.00

## 2016-01-05 HISTORY — DX: Personal history of colon polyps, unspecified: Z86.0100

## 2016-01-05 HISTORY — DX: Unspecified cataract: H26.9

## 2016-01-05 HISTORY — DX: Cramp and spasm: R25.2

## 2016-01-05 HISTORY — DX: Pain in unspecified joint: M25.50

## 2016-01-05 HISTORY — DX: Urgency of urination: R39.15

## 2016-01-05 HISTORY — DX: Personal history of colonic polyps: Z86.010

## 2016-01-05 LAB — BASIC METABOLIC PANEL
Anion gap: 10 (ref 5–15)
BUN: 15 mg/dL (ref 6–20)
CHLORIDE: 102 mmol/L (ref 101–111)
CO2: 29 mmol/L (ref 22–32)
CREATININE: 0.94 mg/dL (ref 0.61–1.24)
Calcium: 9.1 mg/dL (ref 8.9–10.3)
GFR calc non Af Amer: >60 mL/min (ref >60)
Glucose, Bld: 130 mg/dL — ABNORMAL HIGH (ref 65–99)
POTASSIUM: 3.6 mmol/L (ref 3.5–5.1)
SODIUM: 141 mmol/L (ref 135–145)

## 2016-01-05 LAB — GLUCOSE, CAPILLARY: Glucose-Capillary: 133 mg/dL — ABNORMAL HIGH (ref 65–99)

## 2016-01-05 LAB — CBC
HEMATOCRIT: 38.7 % — AB (ref 39.0–52.0)
HEMOGLOBIN: 12.4 g/dL — AB (ref 13.0–17.0)
MCH: 26.8 pg (ref 26.0–34.0)
MCHC: 32 g/dL (ref 30.0–36.0)
MCV: 83.8 fL (ref 78.0–100.0)
Platelets: 213 10*3/uL (ref 150–400)
RBC: 4.62 MIL/uL (ref 4.22–5.81)
RDW: 13.3 % (ref 11.5–15.5)
WBC: 6.1 10*3/uL (ref 4.0–10.5)

## 2016-01-05 LAB — SURGICAL PCR SCREEN
MRSA, PCR: NEGATIVE
Staphylococcus aureus: NEGATIVE

## 2016-01-05 LAB — TYPE AND SCREEN
ABO/RH(D): A NEG
ANTIBODY SCREEN: NEGATIVE

## 2016-01-05 LAB — ABO/RH: ABO/RH(D): A NEG

## 2016-01-05 MED ORDER — CHLORHEXIDINE GLUCONATE 4 % EX LIQD: 60.0000 mL | Freq: Once | CUTANEOUS | Status: DC

## 2016-01-05 NOTE — Progress Notes (Addendum)
Cardiologist is Dr. Lorelee Cover on 06/09/15 and Oct 10,2017.  Medical Md is with [facility]  Echo report in epic from 05-27-15  Heart cath denies  Stress test done at least 15 yrs ago  EKG report in epic from 11-25-15  CXR denies in past yr

## 2016-01-05 NOTE — Pre-Procedure Instructions (Signed)
John [name]  01/05/2016      [facility] [place], [place] - [address] [place] [address] Phone: [phone] Fax: [phone]    Your procedure is scheduled on Mon, Nov 13 @ 11:45 AM  Report to [facility] Admitting at 9:45 AM  Call this number if you have problems the morning of surgery:  [phone]   Remember:  Do not eat food or drink liquids after midnight.  Take these medicines the morning of surgery with A SIP OF WATER Amlodipine(Norvasc)              Stop taking your Naproxen. No Goody's,BC's,Aleve,Advil,Motrin,Ibuprofen,Fish Oil,or any Herbal Medications.      How to Manage Your Diabetes Before and After Surgery  Why is it important to control my blood sugar before and after surgery? . Improving blood sugar levels before and after surgery helps healing and can limit problems. . A way of improving blood sugar control is eating a healthy diet by: o  Eating less sugar and carbohydrates o  Increasing activity/exercise o  Talking with your doctor about reaching your blood sugar goals . High blood sugars (greater than 180 mg/dL) can raise your risk of infections and slow your recovery, so you will need to focus on controlling your diabetes during the weeks before surgery. . Make sure that the doctor who takes care of your diabetes knows about your planned surgery including the date and location.  How do I manage my blood sugar before surgery? . Check your blood sugar at least 4 times a day, starting 2 days before surgery, to make sure that the level is not too high or low. o Check your blood sugar the morning of your surgery when you wake up and every 2 hours until you get to the Short Stay unit. . If your blood sugar is less than 70 mg/dL, you will need to treat for low blood sugar: o Do not take insulin. o Treat a low blood sugar (less than 70 mg/dL) with  cup of clear juice (cranberry or apple), 4 glucose tablets,  OR glucose gel. o Recheck blood sugar in 15 minutes after treatment (to make sure it is greater than 70 mg/dL). If your blood sugar is not greater than 70 mg/dL on recheck, call [phone] for further instructions. . Report your blood sugar to the short stay nurse when you get to Short Stay.  . If you are admitted to the hospital after surgery: o Your blood sugar will be checked by the staff and you will probably be given insulin after surgery (instead of oral diabetes medicines) to make sure you have good blood sugar levels. o The goal for blood sugar control after surgery is 80-180 mg/dL.              WHAT DO I DO ABOUT MY DIABETES MEDICATION?   Marland Kitchen Do not take oral diabetes medicines (pills) the morning of surgery.       . The day of surgery, do not take other diabetes injectables, including Byetta (exenatide), Bydureon (exenatide ER), Victoza (liraglutide), or Trulicity (dulaglutide).  . If your CBG is greater than 220 mg/dL, you may take  of your sliding scale (correction) dose of insulin.  Other Instructions:          Patient Signature:  Date:   Nurse Signature:  Date:   Reviewed and Endorsed by [facility] Patient Education Committee, August 2015   Do not  wear jewelry.  Do not wear lotions, powders,colognes, or deoderant.             Men may shave face and neck.  Do not bring valuables to the hospital.  [facility] is not responsible for any belongings or valuables.  Contacts, dentures or bridgework may not be worn into surgery.  Leave your suitcase in the car.  After surgery it may be brought to your room.  For patients admitted to the hospital, discharge time will be determined by your treatment team.  Patients discharged the day of surgery will not be allowed to drive home.    [facility]  Before surgery, you can play an important role.  Because skin is not sterile, your skin needs to be as free of germs as  possible.  You can reduce the number of germs on you skin by washing with CHG (chlorahexidine gluconate) soap before surgery.  CHG is an antiseptic cleaner which kills germs and bonds with the skin to continue killing germs even after washing.  Please DO NOT use if you have an allergy to CHG or antibacterial soaps.  If your skin becomes reddened/irritated stop using the CHG and inform your nurse when you arrive at Short Stay.  Do not shave (including legs and underarms) for at least 48 hours prior to the first CHG shower.  You may shave your face.  Please follow these instructions carefully:   1.  Shower with CHG Soap the night before surgery and the                                morning of Surgery.  2.  If you choose to wash your hair, wash your hair first as usual with your       normal shampoo.  3.  After you shampoo, rinse your hair and body thoroughly to remove the                      Shampoo.  4.  Use CHG as you would any other liquid soap.  You can apply chg directly       to the skin and wash gently with scrungie or a clean washcloth.  5.  Apply the CHG Soap to your body ONLY FROM THE NECK DOWN.        Do not use on open wounds or open sores.  Avoid contact with your eyes,       ears, mouth and genitals (private parts).  Wash genitals (private parts)       with your normal soap.  6.  Wash thoroughly, paying special attention to the area where your surgery        will be performed.  7.  Thoroughly rinse your body with warm water from the neck down.  8.  DO NOT shower/wash with your normal soap after using and rinsing off       the CHG Soap.  9.  Pat yourself dry with a clean towel.            10.  Wear clean pajamas.            11.  Place clean sheets on your bed the night of your first shower and do not        sleep with pets.  Day of Surgery  Do not apply any lotions/deoderants the morning of surgery.  Please wear clean clothes to the hospital/surgery center.     Please read over  the following fact sheets that you were given. Pain Booklet, Coughing and Deep Breathing, MRSA Information and Surgical Site Infection Prevention

## 2016-01-06 LAB — HEMOGLOBIN A1C
Hgb A1c MFr Bld: 6.6 % — ABNORMAL HIGH (ref 4.8–5.6)
Mean Plasma Glucose: 143 mg/dL

## 2016-01-09 MED ORDER — TRANEXAMIC ACID 1000 MG/10ML IV SOLN
1000.0000 mg | INTRAVENOUS | Status: AC
  Administered 2016-01-12: 1000 mg via INTRAVENOUS
  Filled 2016-01-09: qty 10

## 2016-01-12 ENCOUNTER — Encounter (HOSPITAL_COMMUNITY): Payer: Self-pay | Admitting: *Deleted

## 2016-01-12 ENCOUNTER — Inpatient Hospital Stay (HOSPITAL_COMMUNITY): Payer: PPO | Admitting: Anesthesiology

## 2016-01-12 ENCOUNTER — Inpatient Hospital Stay (HOSPITAL_COMMUNITY): Payer: PPO

## 2016-01-12 ENCOUNTER — Encounter (HOSPITAL_COMMUNITY)
Admission: RE | Disposition: A | Discharge status: Home Health | Payer: Self-pay | Source: Ambulatory Visit | Attending: Orthopedic Surgery

## 2016-01-12 ENCOUNTER — Inpatient Hospital Stay (HOSPITAL_COMMUNITY)
Admission: RE | Admit: 2016-01-12 | Discharge: 2016-01-13 | DRG: 470 | Disposition: A | Discharge status: Home Health | Payer: PPO | Source: Ambulatory Visit | Attending: Orthopedic Surgery | Admitting: Orthopedic Surgery

## 2016-01-12 DIAGNOSIS — R262 Difficulty in walking, not elsewhere classified: Secondary | ICD-10-CM

## 2016-01-12 DIAGNOSIS — Z886 Allergy status to analgesic agent status: Secondary | ICD-10-CM

## 2016-01-12 DIAGNOSIS — Z471 Aftercare following joint replacement surgery: Secondary | ICD-10-CM | POA: Diagnosis not present

## 2016-01-12 DIAGNOSIS — Z86010 Personal history of colonic polyps: Secondary | ICD-10-CM | POA: Diagnosis not present

## 2016-01-12 DIAGNOSIS — I350 Nonrheumatic aortic (valve) stenosis: Secondary | ICD-10-CM | POA: Diagnosis present

## 2016-01-12 DIAGNOSIS — E785 Hyperlipidemia, unspecified: Secondary | ICD-10-CM | POA: Diagnosis present

## 2016-01-12 DIAGNOSIS — E119 Type 2 diabetes mellitus without complications: Secondary | ICD-10-CM | POA: Diagnosis present

## 2016-01-12 DIAGNOSIS — Z87891 Personal history of nicotine dependence: Secondary | ICD-10-CM

## 2016-01-12 DIAGNOSIS — M25561 Pain in right knee: Secondary | ICD-10-CM

## 2016-01-12 DIAGNOSIS — I10 Essential (primary) hypertension: Secondary | ICD-10-CM | POA: Diagnosis not present

## 2016-01-12 DIAGNOSIS — E1122 Type 2 diabetes mellitus with diabetic chronic kidney disease: Secondary | ICD-10-CM | POA: Diagnosis not present

## 2016-01-12 DIAGNOSIS — N181 Chronic kidney disease, stage 1: Secondary | ICD-10-CM | POA: Diagnosis not present

## 2016-01-12 DIAGNOSIS — Z8551 Personal history of malignant neoplasm of bladder: Secondary | ICD-10-CM

## 2016-01-12 DIAGNOSIS — M25661 Stiffness of right knee, not elsewhere classified: Secondary | ICD-10-CM

## 2016-01-12 DIAGNOSIS — I129 Hypertensive chronic kidney disease with stage 1 through stage 4 chronic kidney disease, or unspecified chronic kidney disease: Secondary | ICD-10-CM | POA: Diagnosis not present

## 2016-01-12 DIAGNOSIS — G8918 Other acute postprocedural pain: Secondary | ICD-10-CM

## 2016-01-12 DIAGNOSIS — Z96651 Presence of right artificial knee joint: Secondary | ICD-10-CM | POA: Diagnosis not present

## 2016-01-12 DIAGNOSIS — M1711 Unilateral primary osteoarthritis, right knee: Secondary | ICD-10-CM | POA: Diagnosis not present

## 2016-01-12 DIAGNOSIS — Z09 Encounter for follow-up examination after completed treatment for conditions other than malignant neoplasm: Secondary | ICD-10-CM

## 2016-01-12 HISTORY — PX: KNEE ARTHROPLASTY: SHX992

## 2016-01-12 LAB — GLUCOSE, CAPILLARY
Glucose-Capillary: 150 mg/dL — ABNORMAL HIGH (ref 65–99)
Glucose-Capillary: 175 mg/dL — ABNORMAL HIGH (ref 65–99)
Glucose-Capillary: 294 mg/dL — ABNORMAL HIGH (ref 65–99)

## 2016-01-12 SURGERY — ARTHROPLASTY, KNEE, TOTAL, USING IMAGELESS COMPUTER-ASSISTED NAVIGATION
Anesthesia: Spinal | Site: Knee | Laterality: Right

## 2016-01-12 MED ORDER — ZOLPIDEM TARTRATE 5 MG PO TABS: 5.0000 mg | ORAL_TABLET | Freq: Every evening | ORAL | Status: DC | PRN

## 2016-01-12 MED ORDER — SODIUM CHLORIDE 0.9 % IR SOLN
Status: DC | PRN
  Administered 2016-01-12: 3000 mL
  Administered 2016-01-12: 250 mL

## 2016-01-12 MED ORDER — FENTANYL CITRATE (PF) 100 MCG/2ML IJ SOLN
INTRAMUSCULAR | Status: AC
  Filled 2016-01-12: qty 2

## 2016-01-12 MED ORDER — PHENOL 1.4 % MT LIQD: 1.0000 | OROMUCOSAL | Status: DC | PRN

## 2016-01-12 MED ORDER — SODIUM CHLORIDE 0.9 % IV SOLN: INTRAVENOUS | Status: DC

## 2016-01-12 MED ORDER — LIDOCAINE 2% (20 MG/ML) 5 ML SYRINGE
INTRAMUSCULAR | Status: AC
  Filled 2016-01-12: qty 10

## 2016-01-12 MED ORDER — KETOROLAC TROMETHAMINE 30 MG/ML IJ SOLN
INTRAMUSCULAR | Status: DC | PRN
  Administered 2016-01-12: 30 mg via INTRA_ARTICULAR

## 2016-01-12 MED ORDER — PROPOFOL 10 MG/ML IV BOLUS
INTRAVENOUS | Status: DC | PRN
  Administered 2016-01-12: 20 mg via INTRAVENOUS

## 2016-01-12 MED ORDER — BUPIVACAINE HCL (PF) 0.5 % IJ SOLN
INTRAMUSCULAR | Status: AC
  Filled 2016-01-12: qty 30

## 2016-01-12 MED ORDER — DIPHENHYDRAMINE HCL 12.5 MG/5ML PO ELIX
12.5000 mg | ORAL_SOLUTION | ORAL | Status: DC | PRN
Start: 2016-01-12 — End: 2016-01-13

## 2016-01-12 MED ORDER — METHOCARBAMOL 1000 MG/10ML IJ SOLN
500.0000 mg | Freq: Four times a day (QID) | INTRAVENOUS | Status: DC | PRN
  Filled 2016-01-12: qty 5

## 2016-01-12 MED ORDER — MENTHOL 3 MG MT LOZG: 1.0000 | LOZENGE | OROMUCOSAL | Status: DC | PRN

## 2016-01-12 MED ORDER — BUPIVACAINE HCL 0.5 % IJ SOLN
INTRAMUSCULAR | Status: DC | PRN
  Administered 2016-01-12: 30 mL via INTRA_ARTICULAR

## 2016-01-12 MED ORDER — POVIDONE-IODINE 10 % EX SWAB: 2.0000 "application " | Freq: Once | CUTANEOUS | Status: DC

## 2016-01-12 MED ORDER — ALUM & MAG HYDROXIDE-SIMETH 200-200-20 MG/5ML PO SUSP: 30.0000 mL | ORAL | Status: DC | PRN

## 2016-01-12 MED ORDER — ACETAMINOPHEN 325 MG PO TABS: 650.0000 mg | ORAL_TABLET | Freq: Four times a day (QID) | ORAL | Status: DC | PRN

## 2016-01-12 MED ORDER — PHENYLEPHRINE HCL 10 MG/ML IJ SOLN
INTRAMUSCULAR | Status: DC | PRN
  Administered 2016-01-12: 80 ug via INTRAVENOUS

## 2016-01-12 MED ORDER — INSULIN ASPART 100 UNIT/ML ~~LOC~~ SOLN
0.0000 [IU] | Freq: Every day | SUBCUTANEOUS | Status: DC
  Administered 2016-01-12: 3 [IU] via SUBCUTANEOUS

## 2016-01-12 MED ORDER — ONDANSETRON HCL 4 MG/2ML IJ SOLN: 4.0000 mg | Freq: Four times a day (QID) | INTRAMUSCULAR | Status: DC | PRN

## 2016-01-12 MED ORDER — APIXABAN 2.5 MG PO TABS
2.5000 mg | ORAL_TABLET | Freq: Two times a day (BID) | ORAL | Status: DC
  Administered 2016-01-13: 2.5 mg via ORAL
  Filled 2016-01-12: qty 1

## 2016-01-12 MED ORDER — PHENYLEPHRINE HCL 10 MG/ML IJ SOLN
INTRAMUSCULAR | Status: DC | PRN
  Administered 2016-01-12: 25 ug/min via INTRAVENOUS

## 2016-01-12 MED ORDER — METHOCARBAMOL 500 MG PO TABS: 500.0000 mg | ORAL_TABLET | Freq: Four times a day (QID) | ORAL | Status: DC | PRN

## 2016-01-12 MED ORDER — APIXABAN 2.5 MG PO TABS: 2.5000 mg | ORAL_TABLET | Freq: Two times a day (BID) | ORAL | 0 refills | Status: DC

## 2016-01-12 MED ORDER — LOSARTAN POTASSIUM-HCTZ 50-12.5 MG PO TABS: 1.0000 | ORAL_TABLET | Freq: Every day | ORAL | Status: DC

## 2016-01-12 MED ORDER — DOCUSATE SODIUM 100 MG PO CAPS: 100.0000 mg | ORAL_CAPSULE | Freq: Two times a day (BID) | ORAL | 3 refills | Status: DC

## 2016-01-12 MED ORDER — SODIUM CHLORIDE 0.9 % IV SOLN
1000.0000 mg | Freq: Once | INTRAVENOUS | Status: AC
  Administered 2016-01-12: 1000 mg via INTRAVENOUS
  Filled 2016-01-12: qty 10

## 2016-01-12 MED ORDER — ONDANSETRON HCL 4 MG/2ML IJ SOLN
INTRAMUSCULAR | Status: DC | PRN
  Administered 2016-01-12: 4 mg via INTRAVENOUS

## 2016-01-12 MED ORDER — LACTATED RINGERS IV SOLN
INTRAVENOUS | Status: DC
  Administered 2016-01-12 (×3): via INTRAVENOUS

## 2016-01-12 MED ORDER — KETOROLAC TROMETHAMINE 15 MG/ML IJ SOLN
7.5000 mg | Freq: Four times a day (QID) | INTRAMUSCULAR | Status: AC
  Administered 2016-01-12 – 2016-01-13 (×4): 7.5 mg via INTRAVENOUS
  Filled 2016-01-12 (×4): qty 1

## 2016-01-12 MED ORDER — PRAVASTATIN SODIUM 40 MG PO TABS
40.0000 mg | ORAL_TABLET | Freq: Every day | ORAL | Status: DC
  Administered 2016-01-12: 40 mg via ORAL
  Filled 2016-01-12: qty 1

## 2016-01-12 MED ORDER — ONDANSETRON HCL 4 MG PO TABS
4.0000 mg | ORAL_TABLET | Freq: Four times a day (QID) | ORAL | Status: DC | PRN
Start: 2016-01-12 — End: 2016-01-13

## 2016-01-12 MED ORDER — FENTANYL CITRATE (PF) 100 MCG/2ML IJ SOLN
INTRAMUSCULAR | Status: DC | PRN
  Administered 2016-01-12: 50 ug via INTRAVENOUS
  Administered 2016-01-12 (×2): 25 ug via INTRAVENOUS

## 2016-01-12 MED ORDER — POLYETHYLENE GLYCOL 3350 17 G PO PACK: 17.0000 g | PACK | Freq: Every day | ORAL | Status: DC | PRN

## 2016-01-12 MED ORDER — MIDAZOLAM HCL 2 MG/2ML IJ SOLN: 0.5000 mg | Freq: Once | INTRAMUSCULAR | Status: DC

## 2016-01-12 MED ORDER — HYDROCHLOROTHIAZIDE 12.5 MG PO CAPS
12.5000 mg | ORAL_CAPSULE | Freq: Every day | ORAL | Status: DC
  Administered 2016-01-13: 12.5 mg via ORAL
  Filled 2016-01-12: qty 1

## 2016-01-12 MED ORDER — ACETAMINOPHEN 650 MG RE SUPP: 650.0000 mg | Freq: Four times a day (QID) | RECTAL | Status: DC | PRN

## 2016-01-12 MED ORDER — MIDAZOLAM HCL 2 MG/2ML IJ SOLN
INTRAMUSCULAR | Status: AC
  Administered 2016-01-12: 1 mg
  Filled 2016-01-12: qty 2

## 2016-01-12 MED ORDER — AMLODIPINE BESYLATE 5 MG PO TABS
5.0000 mg | ORAL_TABLET | Freq: Every day | ORAL | Status: DC
  Administered 2016-01-13: 5 mg via ORAL
  Filled 2016-01-12: qty 1

## 2016-01-12 MED ORDER — FENTANYL CITRATE (PF) 100 MCG/2ML IJ SOLN
100.0000 ug | Freq: Once | INTRAMUSCULAR | Status: AC
  Administered 2016-01-12: 50 ug via INTRAVENOUS

## 2016-01-12 MED ORDER — METOCLOPRAMIDE HCL 5 MG PO TABS: 5.0000 mg | ORAL_TABLET | Freq: Three times a day (TID) | ORAL | Status: DC | PRN

## 2016-01-12 MED ORDER — SITAGLIP PHOS-METFORMIN HCL ER 50-1000 MG PO TB24: 1.0000 | ORAL_TABLET | Freq: Every day | ORAL | Status: DC

## 2016-01-12 MED ORDER — METOCLOPRAMIDE HCL 5 MG/ML IJ SOLN
5.0000 mg | Freq: Three times a day (TID) | INTRAMUSCULAR | Status: DC | PRN
Start: 2016-01-12 — End: 2016-01-13

## 2016-01-12 MED ORDER — PHENYLEPHRINE 40 MCG/ML (10ML) SYRINGE FOR IV PUSH (FOR BLOOD PRESSURE SUPPORT)
PREFILLED_SYRINGE | INTRAVENOUS | Status: AC
  Filled 2016-01-12: qty 10

## 2016-01-12 MED ORDER — ACETAMINOPHEN 10 MG/ML IV SOLN
1000.0000 mg | INTRAVENOUS | Status: AC
  Administered 2016-01-12: 1000 mg via INTRAVENOUS

## 2016-01-12 MED ORDER — LINAGLIPTIN 5 MG PO TABS
5.0000 mg | ORAL_TABLET | Freq: Every day | ORAL | Status: DC
  Administered 2016-01-13: 5 mg via ORAL
  Filled 2016-01-12: qty 1

## 2016-01-12 MED ORDER — CEFAZOLIN SODIUM-DEXTROSE 2-4 GM/100ML-% IV SOLN
2.0000 g | INTRAVENOUS | Status: AC
  Administered 2016-01-12: 2 g via INTRAVENOUS
  Filled 2016-01-12: qty 100

## 2016-01-12 MED ORDER — DEXAMETHASONE SODIUM PHOSPHATE 10 MG/ML IJ SOLN
10.0000 mg | Freq: Once | INTRAMUSCULAR | Status: AC
  Administered 2016-01-13: 10 mg via INTRAVENOUS
  Filled 2016-01-12: qty 1

## 2016-01-12 MED ORDER — ONDANSETRON HCL 4 MG PO TABS: 4.0000 mg | ORAL_TABLET | Freq: Three times a day (TID) | ORAL | 0 refills | Status: DC | PRN

## 2016-01-12 MED ORDER — SODIUM CHLORIDE 0.9 % IJ SOLN
INTRAMUSCULAR | Status: DC | PRN
  Administered 2016-01-12: 30 mL

## 2016-01-12 MED ORDER — METFORMIN HCL ER 500 MG PO TB24
1000.0000 mg | ORAL_TABLET | Freq: Every day | ORAL | Status: DC
  Administered 2016-01-13: 1000 mg via ORAL
  Filled 2016-01-12: qty 2

## 2016-01-12 MED ORDER — LOSARTAN POTASSIUM 50 MG PO TABS
50.0000 mg | ORAL_TABLET | Freq: Every day | ORAL | Status: DC
  Administered 2016-01-13: 50 mg via ORAL
  Filled 2016-01-12: qty 1

## 2016-01-12 MED ORDER — DOCUSATE SODIUM 100 MG PO CAPS
100.0000 mg | ORAL_CAPSULE | Freq: Two times a day (BID) | ORAL | Status: DC
  Administered 2016-01-13: 100 mg via ORAL
  Filled 2016-01-12 (×2): qty 1

## 2016-01-12 MED ORDER — SENNA 8.6 MG PO TABS
2.0000 | ORAL_TABLET | Freq: Every day | ORAL | Status: DC
  Filled 2016-01-12: qty 2

## 2016-01-12 MED ORDER — PROPOFOL 500 MG/50ML IV EMUL
INTRAVENOUS | Status: DC | PRN
  Administered 2016-01-12: 40 ug/kg/min via INTRAVENOUS

## 2016-01-12 MED ORDER — ACETAMINOPHEN 10 MG/ML IV SOLN
INTRAVENOUS | Status: AC
  Filled 2016-01-12: qty 100

## 2016-01-12 MED ORDER — EPHEDRINE 5 MG/ML INJ
INTRAVENOUS | Status: AC
Start: 2016-01-12 — End: 2016-01-12
  Filled 2016-01-12: qty 10

## 2016-01-12 MED ORDER — HYDROMORPHONE HCL 2 MG/ML IJ SOLN: 0.5000 mg | INTRAMUSCULAR | Status: DC | PRN

## 2016-01-12 MED ORDER — KETOROLAC TROMETHAMINE 30 MG/ML IJ SOLN
INTRAMUSCULAR | Status: AC
  Filled 2016-01-12: qty 1

## 2016-01-12 MED ORDER — DEXAMETHASONE SODIUM PHOSPHATE 10 MG/ML IJ SOLN
INTRAMUSCULAR | Status: AC
  Filled 2016-01-12: qty 1

## 2016-01-12 MED ORDER — DEXAMETHASONE SODIUM PHOSPHATE 10 MG/ML IJ SOLN
INTRAMUSCULAR | Status: DC | PRN
  Administered 2016-01-12: 10 mg via INTRAVENOUS

## 2016-01-12 MED ORDER — BUPIVACAINE IN DEXTROSE 0.75-8.25 % IT SOLN
INTRATHECAL | Status: DC | PRN
  Administered 2016-01-12: 2 mL via INTRATHECAL

## 2016-01-12 MED ORDER — ONDANSETRON HCL 4 MG/2ML IJ SOLN
INTRAMUSCULAR | Status: AC
  Filled 2016-01-12: qty 2

## 2016-01-12 MED ORDER — SODIUM CHLORIDE 0.9 % IV SOLN
INTRAVENOUS | Status: DC
  Administered 2016-01-12 – 2016-01-13 (×2): via INTRAVENOUS

## 2016-01-12 MED ORDER — SENNA 8.6 MG PO TABS: 2.0000 | ORAL_TABLET | Freq: Every day | ORAL | 3 refills | Status: DC

## 2016-01-12 MED ORDER — HYDROCODONE-ACETAMINOPHEN 5-325 MG PO TABS: 1.0000 | ORAL_TABLET | ORAL | 0 refills | Status: DC | PRN

## 2016-01-12 MED ORDER — HYDROCODONE-ACETAMINOPHEN 5-325 MG PO TABS
1.0000 | ORAL_TABLET | ORAL | Status: DC | PRN
  Administered 2016-01-12 – 2016-01-13 (×4): 2 via ORAL
  Filled 2016-01-12 (×4): qty 2

## 2016-01-12 MED ORDER — CEFAZOLIN SODIUM-DEXTROSE 2-4 GM/100ML-% IV SOLN
2.0000 g | Freq: Four times a day (QID) | INTRAVENOUS | Status: AC
  Administered 2016-01-12 – 2016-01-13 (×2): 2 g via INTRAVENOUS
  Filled 2016-01-12 (×2): qty 100

## 2016-01-12 SURGICAL SUPPLY — 64 items
ALCOHOL ISOPROPYL (RUBBING) (MISCELLANEOUS) ×2 IMPLANT
BANDAGE ACE 6X5 VEL STRL LF (GAUZE/BANDAGES/DRESSINGS) ×2 IMPLANT
BANDAGE ELASTIC 6 VELCRO ST LF (GAUZE/BANDAGES/DRESSINGS) ×2 IMPLANT
BLADE SAW RECIP 87.9 MT (BLADE) ×2 IMPLANT
BONE CEMENT GENTAMICIN (Cement) IMPLANT
BONE CEMENT SIMPLEX TOBRAMYCIN (Cement) ×3 IMPLANT
CAPT KNEE TOTAL 3 ×2 IMPLANT
CEMENT BONE GENTAMICIN 40 (Cement) IMPLANT
CEMENT BONE SIMPLEX TOBRAMYCIN (Cement) ×3 IMPLANT
CHLORAPREP W/TINT 26ML (MISCELLANEOUS) ×4 IMPLANT
COVER SURGICAL LIGHT HANDLE (MISCELLANEOUS) ×2 IMPLANT
CUFF TOURNIQUET SINGLE 34IN LL (TOURNIQUET CUFF) ×2 IMPLANT
DERMABOND ADVANCED (GAUZE/BANDAGES/DRESSINGS) ×2
DERMABOND ADVANCED .7 DNX12 (GAUZE/BANDAGES/DRESSINGS) ×2 IMPLANT
DRAPE EXTREMITY T 121X128X90 (DRAPE) ×2 IMPLANT
DRAPE SHEET LG 3/4 BI-LAMINATE (DRAPES) ×4 IMPLANT
DRAPE U-SHAPE 47X51 STRL (DRAPES) ×2 IMPLANT
DRAPE UNIVERSAL PACK (DRAPES) ×2 IMPLANT
DRSG AQUACEL AG ADV 3.5X14 (GAUZE/BANDAGES/DRESSINGS) ×2 IMPLANT
DRSG MEPILEX BORDER 4X4 (GAUZE/BANDAGES/DRESSINGS) ×2 IMPLANT
DRSG TEGADERM 2-3/8X2-3/4 SM (GAUZE/BANDAGES/DRESSINGS) ×2 IMPLANT
ELECT BLADE 4.0 EZ CLEAN MEGAD (MISCELLANEOUS) ×2
ELECT REM PT RETURN 9FT ADLT (ELECTROSURGICAL) ×2
ELECTRODE BLDE 4.0 EZ CLN MEGD (MISCELLANEOUS) ×1 IMPLANT
ELECTRODE REM PT RTRN 9FT ADLT (ELECTROSURGICAL) ×1 IMPLANT
EVACUATOR 1/8 PVC DRAIN (DRAIN) ×2 IMPLANT
GLOVE BIO SURGEON STRL SZ 6.5 (GLOVE) ×2 IMPLANT
GLOVE BIO SURGEON STRL SZ8.5 (GLOVE) ×8 IMPLANT
GLOVE BIOGEL PI IND STRL 6.5 (GLOVE) ×1 IMPLANT
GLOVE BIOGEL PI IND STRL 8.5 (GLOVE) ×1 IMPLANT
GLOVE BIOGEL PI INDICATOR 6.5 (GLOVE) ×1
GLOVE BIOGEL PI INDICATOR 8.5 (GLOVE) ×1
GLOVE ECLIPSE 7.0 STRL STRAW (GLOVE) ×4 IMPLANT
GLOVE INDICATOR 7.0 STRL GRN (GLOVE) ×2 IMPLANT
GLOVE SURG SS PI 6.0 STRL IVOR (GLOVE) ×2 IMPLANT
GLOVE SURG SS PI 8.0 STRL IVOR (GLOVE) ×6 IMPLANT
GOWN STRL REUS W/ TWL LRG LVL3 (GOWN DISPOSABLE) ×2 IMPLANT
GOWN STRL REUS W/TWL 2XL LVL3 (GOWN DISPOSABLE) ×4 IMPLANT
GOWN STRL REUS W/TWL LRG LVL3 (GOWN DISPOSABLE) ×2
HANDPIECE INTERPULSE COAX TIP (DISPOSABLE) ×1
KIT BASIN OR (CUSTOM PROCEDURE TRAY) ×2 IMPLANT
MANIFOLD NEPTUNE II (INSTRUMENTS) ×2 IMPLANT
MARKER SKIN DUAL TIP RULER LAB (MISCELLANEOUS) ×2 IMPLANT
NEEDLE SPNL 18GX3.5 QUINCKE PK (NEEDLE) ×4 IMPLANT
PACK TOTAL JOINT (CUSTOM PROCEDURE TRAY) ×2 IMPLANT
PACK TOTAL KNEE CUSTOM (KITS) ×2 IMPLANT
SAW OSC TIP CART 19.5X105X1.3 (SAW) ×2 IMPLANT
SEALER BIPOLAR AQUA 6.0 (INSTRUMENTS) ×2 IMPLANT
SET HNDPC FAN SPRY TIP SCT (DISPOSABLE) ×1 IMPLANT
SET PAD KNEE POSITIONER (MISCELLANEOUS) ×2 IMPLANT
SUT MNCRL AB 3-0 PS2 18 (SUTURE) ×2 IMPLANT
SUT MNCRL AB 3-0 PS2 27 (SUTURE) ×2 IMPLANT
SUT MON AB 2-0 CT1 36 (SUTURE) ×6 IMPLANT
SUT VIC AB 1 CTX 27 (SUTURE) ×2 IMPLANT
SUT VIC AB 1 CTX 36 (SUTURE) ×1
SUT VIC AB 1 CTX36XBRD ANBCTR (SUTURE) ×1 IMPLANT
SUT VIC AB 2-0 CT1 27 (SUTURE) ×1
SUT VIC AB 2-0 CT1 TAPERPNT 27 (SUTURE) ×1 IMPLANT
SUT VLOC 180 0 24IN GS25 (SUTURE) ×2 IMPLANT
SYR 50ML LL SCALE MARK (SYRINGE) ×4 IMPLANT
TOWEL OR 17X26 10 PK STRL BLUE (TOWEL DISPOSABLE) ×2 IMPLANT
TOWER CARTRIDGE SMART MIX (DISPOSABLE) IMPLANT
TRAY CATH 16FR W/PLASTIC CATH (SET/KITS/TRAYS/PACK) ×2 IMPLANT
WRAP KNEE MAXI GEL POST OP (GAUZE/BANDAGES/DRESSINGS) ×2 IMPLANT

## 2016-01-12 NOTE — Anesthesia Postprocedure Evaluation (Signed)
Anesthesia Post Note  Patient: John [name]  Procedure(s) Performed: Procedure(s) (LRB): RIGHT  TOTAL KNEE ARTHROPLASTY WITH COMPUTER NAVIGATION (Right)  Patient location during evaluation: PACU Anesthesia Type: Spinal and MAC Level of consciousness: awake and alert Pain management: pain level controlled Vital Signs Assessment: post-procedure vital signs reviewed and stable Respiratory status: spontaneous breathing and respiratory function stable Cardiovascular status: blood pressure returned to baseline and stable Postop Assessment: spinal receding Anesthetic complications: no    Last Vitals:  Vitals:   01/12/16 1600 01/12/16 1608  BP:    Pulse: 86 85  Resp: 14 15  Temp:  36.7 C    Last Pain:  Vitals:   01/12/16 0941  TempSrc: Oral                 Iaan Oregel DANIEL

## 2016-01-12 NOTE — Op Note (Signed)
OPERATIVE REPORT  SURGEON: Rod Can, MD   ASSISTANT: Ky Barban, RNFA.  PREOPERATIVE DIAGNOSIS: Right knee arthritis.   POSTOPERATIVE DIAGNOSIS: Right knee arthritis.   PROCEDURE: Right total knee arthroplasty.   IMPLANTS: Stryker Triathlon CR femur, size 6. Stryker universal tibia, size 5. X3 polyethelyene insert, size 13 mm, CS. 3 button asymmetric patella, size 38 mm. Simplex P bone cement.  ANESTHESIA:  Spinal  TOURNIQUET TIME: 20 min at 327mm Hg.  ESTIMATED BLOOD LOSS: 350 mL.  ANTIBIOTICS: 2 g Ancef.  DRAINS: Medium HV x1.  COMPLICATIONS: None   CONDITION: PACU - hemodynamically stable.   BRIEF CLINICAL NOTE: John [name] is a 75 y.o. male with a long-standing history of Right knee arthritis. After failing conservative management, the patient was indicated for total knee arthroplasty. The risks, benefits, and alternatives to the procedure were explained, and the patient elected to proceed.  PROCEDURE IN DETAIL: The surgical site was marked in the pre-op holding area. Once inside the operative room, Spinal anesthesia was obtained, and a foley catheter was inserted. The patient was then positioned, a nonsterile tourniquet was placed, and the lower extremity was prepped and draped in the normal sterile surgical fashion. A time-out was called verifying side and site of surgery. The patient received IV antibiotics within 60 minutes of beginning the procedure.   An anterior approach to the knee was performed utilizing a midvastus arthrotomy. A medial release was performed and the patellar fat pad was excised. Stryker navigation was used to cut the distal femur perpendicular to the mechanical axis. A freehand patellar resection was performed, and the patella was sized an prepared with 3 lug holes.  Nagivation was used to make a neutral proximal tibia resection, taking 8  mm of bone from the less affected lateral side with 3 degrees of slope. The menisci were excised. A spacer block was placed, and the alignment and balance in extension were confirmed.   The distal femur was sized using the 3-degree external rotation guide referencing the posterior femoral cortex. The appropriate 4-in-1 cutting block was pinned into place. Rotation was checked using Whiteside's line, the epicondylar axis, and then confirmed with a spacer block in flexion. The remaining femoral cuts were performed, taking care to protect the MCL.  The tibia was sized and the trial tray was pinned into place. The remaining trail components were inserted. The knee was stable to varus and valgus stress through a full range of motion. The patella tracked centrally, and the PCL was well balanced. The trial components were removed, and the proximal tibial surface was prepared. The extremity was exsanguinated with an Esmarch, and the tourniquet was inflated. Small drill holes were made in the sclerotic subchondral bone.The cut bony surfaces were irrigated with pulse lavage. Final components were cemented into place and excess cement was cleared. The trial insert was placed, and the knee was brought into extension while the cement polymerized. Once the cement was hard, the knee was tested for a final time and found to be well balanced. The trial insert was exchanged for the real polyethylene insert.  The wound was copiously irrigated with a dilute betadine solution followed by normal saline with pulse lavage. Marcaine solution was injected into the periarticular soft tissue. The wound was closed in layers using #1 Vicryl and V-Loc for the fascia, 2-0 Vicryl for the subcutaneous fat, 2-0 Monocryl for the deep dermal layer, 3-0 running Monocryl subcuticular Stitch, and Dermabond for the skin. Once the glue was fully dried, an Aquacell  Ag and compressive dressing were applied. The tourniquet was let down, and the  patient was transported to the recovery room in stable ondition. Sponge, needle, and instrument counts were correct at the end of the case x2. The patient tolerated the procedure well and there were no known complications.

## 2016-01-12 NOTE — Anesthesia Procedure Notes (Signed)
Spinal  Patient location during procedure: OR Staffing Anesthesiologist: Alphonse Asbridge Performed: anesthesiologist  Preanesthetic Checklist Completed: patient identified, site marked, surgical consent, pre-op evaluation, timeout performed, IV checked, risks and benefits discussed and monitors and equipment checked Spinal Block Patient position: sitting Prep: Betadine Patient monitoring: heart rate, continuous pulse ox and blood pressure Injection technique: single-shot Needle Needle type: Spinocan  Needle gauge: 22 G Needle length: 9 cm Additional Notes Expiration date of kit checked and confirmed. Patient tolerated procedure well, without complications.       

## 2016-01-12 NOTE — Transfer of Care (Signed)
Immediate Anesthesia Transfer of Care Note  Patient: John [name]  Procedure(s) Performed: Procedure(s) with comments: RIGHT  TOTAL KNEE ARTHROPLASTY WITH COMPUTER NAVIGATION (Right) - Needs RNFA  Patient Location: PACU  Anesthesia Type:Spinal and MAC combined with regional for post-op pain  Level of Consciousness: awake, alert , oriented and patient cooperative  Airway & Oxygen Therapy: Patient Spontanous Breathing and Patient connected to nasal cannula oxygen  Post-op Assessment: Report given to RN, Post -op Vital signs reviewed and stable and Patient moving all extremities  Post vital signs: Reviewed and stable  Last Vitals:  Vitals:   01/12/16 0941  BP: (!) 164/73  Pulse: 88  Resp: 18  Temp: 37.2 C    Last Pain:  Vitals:   01/12/16 0941  TempSrc: Oral      Patients Stated Pain Goal: 5 ([id number])  Complications: No apparent anesthesia complications

## 2016-01-12 NOTE — Evaluation (Signed)
Physical Therapy Evaluation Patient Details Name: John [name] MRN: [id number] DOB: 25-Jul-1940 Today's Date: 01/12/2016   History of Present Illness  75 y.o. male admitted to [facility] on 01/12/16 for elective R TKA.  Pt with significant PMHx of bladder CA s/p surgery, urinary urgency, HTN, dyspnea, DM, anemia, and bil THA.    Clinical Impression  Pt is POD #0 and is moving well, min guard assist with RW around the room.  He will likely progress well enough to d/c home with HHPT and his wife's supervision.   PT to follow acutely for deficits listed below.       Follow Up Recommendations Home health PT;Supervision for mobility/OOB    Equipment Recommendations  None recommended by PT    Recommendations for Other Services   NA    Precautions / Restrictions Precautions Precautions: Fall;Knee Precaution Booklet Issued: Yes (comment) Precaution Comments: knee handout given Restrictions Weight Bearing Restrictions: Yes RLE Weight Bearing: Weight bearing as tolerated      Mobility  Bed Mobility Overal bed mobility: Needs Assistance Bed Mobility: Supine to Sit     Supine to sit: Supervision;HOB elevated     General bed mobility comments: supervision for safety HOB elevated and pt using railing for leverage.   Transfers Overall transfer level: Needs assistance Equipment used: Rolling walker (2 wheeled) Transfers: Sit to/from Stand Sit to Stand: Min guard         General transfer comment: Min guard assist for safety/balance at trunk during transitions.  Verbal cues for safe hand placement.   Ambulation/Gait Ambulation/Gait assistance: Min guard Ambulation Distance (Feet): 20 Feet Assistive device: Rolling walker (2 wheeled) Gait Pattern/deviations: Step-to pattern;Antalgic Gait velocity: decreased   General Gait Details: Verbal cues for safe LE sequencing, WBAT status of right leg and safe RW use. Min guard assist for safety and balance at trunk.  No reports of  lightheadedness.   Stairs Stairs:  (verbally reviewed up the the good down with the bad)                 Balance Overall balance assessment: Needs assistance Sitting-balance support: No upper extremity supported;Feet supported Sitting balance-Leahy Scale: Good     Standing balance support: Bilateral upper extremity supported;No upper extremity supported;Single extremity supported Standing balance-Leahy Scale: Fair                               Pertinent Vitals/Pain Pain Assessment: Faces Faces Pain Scale: Hurts even more Pain Location: right knee Pain Descriptors / Indicators: Aching;Burning;Guarding;Grimacing Pain Intervention(s): Limited activity within patient's tolerance;Monitored during session;Repositioned;RN gave pain meds during session;Ice applied    Home Living Family/patient expects to be discharged to:: Private residence Living Arrangements: Spouse/significant other Available Help at Discharge: Family;Available 24 hours/day Type of Home: House Home Access: Stairs to enter Entrance Stairs-Rails: None Entrance Stairs-Number of Steps: 1 (small stoop) Home Layout: One level Home Equipment: Walker - 2 wheels;Cane - single point;Bedside commode (pt is borrowing a BSC from a friend)      Prior Function Level of Independence: Independent with assistive device(s)         Comments: used cane PRN     Hand Dominance   Dominant Hand: Right    Extremity/Trunk Assessment   Upper Extremity Assessment: Defer to OT evaluation           Lower Extremity Assessment: RLE deficits/detail RLE Deficits / Details: right leg with normal post op pain and weakness.  Ankle at least 3/5, knee 2+/5, hip 3-/5    Cervical / Trunk Assessment: Normal  Communication   Communication: HOH  Cognition Arousal/Alertness: Awake/alert Behavior During Therapy: WFL for tasks assessed/performed Overall Cognitive Status: Within Functional Limits for tasks assessed                          Exercises Total Joint Exercises Ankle Circles/Pumps: AROM;Both;20 reps   Assessment/Plan    PT Assessment Patient needs continued PT services  PT Problem List Decreased strength;Decreased range of motion;Decreased activity tolerance;Decreased balance;Decreased mobility;Decreased knowledge of use of DME;Decreased knowledge of precautions;Pain          PT Treatment Interventions DME instruction;Gait training;Stair training;Functional mobility training;Therapeutic activities;Therapeutic exercise;Balance training;Cognitive remediation;Patient/family education;Modalities;Manual techniques    PT Goals (Current goals can be found in the Care Plan section)  Acute Rehab PT Goals Patient Stated Goal: To go home tomorrow PT Goal Formulation: With patient Time For Goal Achievement: 01/19/16 Potential to Achieve Goals: Good    Frequency 7X/week           End of Session Equipment Utilized During Treatment: Gait belt Activity Tolerance: Patient tolerated treatment well Patient left: in chair;with call bell/phone within reach Nurse Communication: Mobility status         Time: [id number] PT Time Calculation (min) (ACUTE ONLY): 33 min   Charges:   PT Evaluation $PT Eval Moderate Complexity: 1 Procedure PT Treatments $Gait Training: 8-22 mins        Marliss Buttacavoli B. Kinzie Wickes, PT, DPT [phone]   01/12/2016, 5:56 PM

## 2016-01-12 NOTE — Discharge Instructions (Signed)
° °Dr. Jency Schnieders °Total Joint Specialist °[facility] °[address] °, [place] [address] °[phone] ° °TOTAL KNEE REPLACEMENT POSTOPERATIVE DIRECTIONS ° ° ° °Knee Rehabilitation, Guidelines Following Surgery  °Results after knee surgery are often greatly improved when you follow the exercise, range of motion and muscle strengthening exercises prescribed by your doctor. Safety measures are also important to protect the knee from further injury. Any time any of these exercises cause you to have increased pain or swelling in your knee joint, decrease the amount until you are comfortable again and slowly increase them. If you have problems or questions, call your caregiver or physical therapist for advice.  ° °WEIGHT BEARING °Weight bearing as tolerated with assist device (walker, cane, etc) as directed, use it as long as suggested by your surgeon or therapist, typically at least 4-6 weeks. ° °HOME CARE INSTRUCTIONS  °Remove items at home which could result in a fall. This includes throw rugs or furniture in walking pathways.  °Continue medications as instructed at time of discharge. °You may have some home medications which will be placed on hold until you complete the course of blood thinner medication.  °You may start showering once you are discharged home but do not submerge the incision under water. Just pat the incision dry and apply a dry gauze dressing on daily. °Walk with walker as instructed.  °You may resume a sexual relationship in one month or when given the OK by your doctor.  °· Use walker as long as suggested by your caregivers. °· Avoid periods of inactivity such as sitting longer than an hour when not asleep. This helps prevent blood clots.  °You may put full weight on your legs and walk as much as is comfortable.  °You may return to work once you are cleared by your doctor.  °Do not drive a car for 6 weeks or until released by you surgeon.  °· Do not drive  while taking narcotics.  °Wear the elastic stockings for three weeks following surgery during the day but you may remove then at night. °Make sure you keep all of your appointments after your operation with all of your doctors and caregivers. You should call the office at the above phone number and make an appointment for approximately two weeks after the date of your surgery. °Do not remove your surgical dressing. The dressing is waterproof; you may take showers in 3 days, but do not take tub baths or submerge the dressing. °Please pick up a stool softener and laxative for home use as long as you are requiring pain medications. °· ICE to the affected knee every three hours for 30 minutes at a time and then as needed for pain and swelling.  Continue to use ice on the knee for pain and swelling from surgery. You may notice swelling that will progress down to the foot and ankle.  This is normal after surgery.  Elevate the leg when you are not up walking on it.   °It is important for you to complete the blood thinner medication as prescribed by your doctor. °· Continue to use the breathing machine which will help keep your temperature down.  It is common for your temperature to cycle up and down following surgery, especially at night when you are not up moving around and exerting yourself.  The breathing machine keeps your lungs expanded and your temperature down. ° °RANGE OF MOTION AND STRENGTHENING EXERCISES  °Rehabilitation of the knee is important following   a knee injury or an operation. After just a few days of immobilization, the muscles of the thigh which control the knee become weakened and shrink (atrophy). Knee exercises are designed to build up the tone and strength of the thigh muscles and to improve knee motion. Often times heat used for twenty to thirty minutes before working out will loosen up your tissues and help with improving the range of motion but do not use heat for the first two weeks following  surgery. These exercises can be done on a training (exercise) mat, on the floor, on a table or on a bed. Use what ever works the best and is most comfortable for you Knee exercises include:  °Leg Lifts - While your knee is still immobilized in a splint or cast, you can do straight leg raises. Lift the leg to 60 degrees, hold for 3 sec, and slowly lower the leg. Repeat 10-20 times 2-3 times daily. Perform this exercise against resistance later as your knee gets better.  °Quad and Hamstring Sets - Tighten up the muscle on the front of the thigh (Quad) and hold for 5-10 sec. Repeat this 10-20 times hourly. Hamstring sets are done by pushing the foot backward against an object and holding for 5-10 sec. Repeat as with quad sets.  °A rehabilitation program following serious knee injuries can speed recovery and prevent re-injury in the future due to weakened muscles. Contact your doctor or a physical therapist for more information on knee rehabilitation.  ° °SKILLED REHAB INSTRUCTIONS: °If the patient is transferred to a skilled rehab facility following release from the hospital, a list of the current medications will be sent to the facility for the patient to continue.  When discharged from the skilled rehab facility, please have the facility set up the patient's Home Health Physical Therapy prior to being released. Also, the skilled facility will be responsible for providing the patient with their medications at time of release from the facility to include their pain medication, the muscle relaxants, and their blood thinner medication. If the patient is still at the rehab facility at time of the two week follow up appointment, the skilled rehab facility will also need to assist the patient in arranging follow up appointment in our office and any transportation needs. ° °MAKE SURE YOU:  °Understand these instructions.  °Will watch your condition.  °Will get help right away if you are not doing well or get worse.   ° ° °Pick up stool softner and laxative for home use following surgery while on pain medications. °Do NOT remove your dressing. You may shower.  °Do not take tub baths or submerge incision under water. °May shower starting three days after surgery. °Please use a clean towel to pat the incision dry following showers. °Continue to use ice for pain and swelling after surgery. °Do not use any lotions or creams on the incision until instructed by your surgeon. ° °

## 2016-01-12 NOTE — Interval H&P Note (Signed)
History and Physical Interval Note:  01/12/2016 10:26 AM  John [name]  has presented today for surgery, with the diagnosis of DJD Right Knee  The various methods of treatment have been discussed with the patient and family. After consideration of risks, benefits and other options for treatment, the patient has consented to  Procedure(s) with comments: RIGHT  TOTAL KNEE ARTHROPLASTY WITH COMPUTER NAVIGATION (Right) - Needs RNFA as a surgical intervention .  The patient's history has been reviewed, patient examined, no change in status, stable for surgery.  I have reviewed the patient's chart and labs.  Questions were answered to the patient's satisfaction.     [name], [name]

## 2016-01-12 NOTE — H&P (View-Only) (Signed)
TOTAL KNEE ADMISSION H&P  Patient is being admitted for right total knee arthroplasty.  Subjective:  Chief Complaint:right knee pain.  HPI: John [name], 75 y.o. male, has a history of pain and functional disability in the right knee due to arthritis and has failed non-surgical conservative treatments for greater than 12 weeks to includeNSAID's and/or analgesics, corticosteriod injections, viscosupplementation injections, flexibility and strengthening excercises, use of assistive devices, weight reduction as appropriate and activity modification.  Onset of symptoms was gradual, starting >10 years ago with gradually worsening course since that time. The patient noted no past surgery on the right knee(s).  Patient currently rates pain in the right knee(s) at 10 out of 10 with activity. Patient has night pain, worsening of pain with activity and weight bearing, pain that interferes with activities of daily living, pain with passive range of motion, crepitus and joint swelling.  Patient has evidence of subchondral cysts, subchondral sclerosis, periarticular osteophytes and joint space narrowing by imaging studies. There is no active infection.  Patient Active Problem List   Diagnosis Date Noted  . Aortic valve stenosis, moderate 05/30/2015  . Bladder cancer ([place]) 11/15/2014  . Hyperlipidemia 11/15/2014  . ED (erectile dysfunction) 11/15/2014  . Osteoarthritis 11/15/2014  . Hypertension 11/15/2014  . Heart murmur 11/15/2014  . 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 Anemia 11/15/2014   Past Medical History:  Diagnosis Date  . Anemia   . Cancer ([place])    bladder  . Chronic kidney disease   . Diabetes mellitus without complication ([place])   . ED (erectile dysfunction)   . Heart murmur   . Hyperlipidemia   . Hypertension   . Osteoarthritis     Past Surgical History:  Procedure Laterality Date  . BLADDER SURGERY    . JOINT REPLACEMENT     hip  .  SKIN SURGERY Left    arm     (Not in a hospital admission) Allergies  Allergen Reactions  . Aspirin Other (See Comments)    bruising    Social History  Substance Use Topics  . Smoking status: Former Smoker    Quit date: 12/15/1980  . Smokeless tobacco: Never Used  . Alcohol use No    Family History  Problem Relation Age of Onset  . Dementia Mother   . Stroke Father   . Diabetes Father   . Heart disease Father   . Cancer Sister   . Hypertension Brother   . Cancer Sister      Review of Systems  Constitutional: Negative.   HENT: Positive for tinnitus.   Eyes: Negative.   Respiratory: Positive for shortness of breath.   Cardiovascular: Negative.   Gastrointestinal: Negative.   Genitourinary: Negative.   Musculoskeletal: Positive for joint pain.  Skin: Negative.   Neurological: Negative.   Endo/Heme/Allergies: Negative.   Psychiatric/Behavioral: Negative.     Objective:  Physical Exam  Vitals reviewed. Constitutional: He is oriented to person, place, and time. He appears well-developed and well-nourished.  HENT:  Head: Normocephalic and atraumatic.  Eyes: Conjunctivae are normal. Pupils are equal, round, and reactive to light.  Neck: Normal range of motion.  Cardiovascular: Normal rate, regular rhythm and intact distal pulses.   Respiratory: Effort normal and breath sounds normal.  GI: Soft. He exhibits no distension.  Genitourinary:  Genitourinary Comments: deferred  Musculoskeletal:       Right knee: He exhibits decreased range of motion, swelling and abnormal alignment. Tenderness found. Medial joint  line and lateral joint line tenderness noted.  Neurological: He is alert and oriented to person, place, and time. He has normal reflexes.  Skin: Skin is warm and dry.  Psychiatric: He has a normal mood and affect. His behavior is normal. Judgment and thought content normal.    Vital signs in last 24 hours: @VSRANGES @  Labs:   Estimated body mass index  is 29.85 kg/m as calculated from the following:   Height as of 11/25/15: 5' 10.3" (1.786 m).   Weight as of 11/25/15: 95.2 kg (209 lb 12.8 oz).   Imaging Review Plain radiographs demonstrate severe degenerative joint disease of the right knee(s). The overall alignment issignificant varus. The bone quality appears to be adequate for age and reported activity level.  Assessment/Plan:  End stage arthritis, right knee   The patient history, physical examination, clinical judgment of the provider and imaging studies are consistent with end stage degenerative joint disease of the right knee(s) and total knee arthroplasty is deemed medically necessary. The treatment options including medical management, injection therapy arthroscopy and arthroplasty were discussed at length. The risks and benefits of total knee arthroplasty were presented and reviewed. The risks due to aseptic loosening, infection, stiffness, patella tracking problems, thromboembolic complications and other imponderables were discussed. The patient acknowledged the explanation, agreed to proceed with the plan and consent was signed. Patient is being admitted for inpatient treatment for surgery, pain control, PT, OT, prophylactic antibiotics, VTE prophylaxis, progressive ambulation and ADL's and discharge planning. The patient is planning to be discharged home with home health services

## 2016-01-12 NOTE — Anesthesia Preprocedure Evaluation (Addendum)
Anesthesia Evaluation  Patient identified by MRN, date of birth, ID band Patient awake    Reviewed: Allergy & Precautions, H&P , NPO status , Patient's Chart, lab work & pertinent test results, reviewed documented beta blocker date and time   Airway Mallampati: I  TM Distance: >3 FB Neck ROM: Full    Dental no notable dental hx.    Pulmonary neg pulmonary ROS, former smoker,    Pulmonary exam normal breath sounds clear to auscultation       Cardiovascular hypertension, negative cardio ROS Normal cardiovascular exam Rhythm:Regular Rate:Normal     Neuro/Psych negative neurological ROS  negative psych ROS   GI/Hepatic negative GI ROS, Neg liver ROS,   Endo/Other  negative endocrine ROSdiabetes  Renal/GU negative Renal ROS  negative genitourinary   Musculoskeletal negative musculoskeletal ROS (+)   Abdominal   Peds negative pediatric ROS (+)  Hematology negative hematology ROS (+)   Anesthesia Other Findings   Reproductive/Obstetrics negative OB ROS                            Anesthesia Physical Anesthesia Plan  ASA: II  Anesthesia Plan: Spinal   Post-op Pain Management:  Regional for Post-op pain   Induction:   Airway Management Planned:   Additional Equipment:   Intra-op Plan:   Post-operative Plan:   Informed Consent: I have reviewed the patients History and Physical, chart, labs and discussed the procedure including the risks, benefits and alternatives for the proposed anesthesia with the patient or authorized representative who has indicated his/her understanding and acceptance.   Dental Advisory Given  Plan Discussed with: Anesthesiologist, CRNA and Surgeon  Anesthesia Plan Comments:        Anesthesia Quick Evaluation

## 2016-01-13 ENCOUNTER — Encounter (HOSPITAL_COMMUNITY): Payer: Self-pay | Admitting: Orthopedic Surgery

## 2016-01-13 LAB — GLUCOSE, CAPILLARY
Glucose-Capillary: 161 mg/dL — ABNORMAL HIGH (ref 65–99)
Glucose-Capillary: 153 mg/dL — ABNORMAL HIGH (ref 65–99)

## 2016-01-13 LAB — BASIC METABOLIC PANEL
Anion gap: 9 (ref 5–15)
BUN: 24 mg/dL — AB (ref 6–20)
CO2: 27 mmol/L (ref 22–32)
CREATININE: 1.02 mg/dL (ref 0.61–1.24)
Calcium: 8 mg/dL — ABNORMAL LOW (ref 8.9–10.3)
Chloride: 102 mmol/L (ref 101–111)
GFR calc Af Amer: >60 mL/min (ref >60)
GLUCOSE: 190 mg/dL — AB (ref 65–99)
POTASSIUM: 3.9 mmol/L (ref 3.5–5.1)
Sodium: 138 mmol/L (ref 135–145)

## 2016-01-13 LAB — CBC
HCT: 29.8 % — ABNORMAL LOW (ref 39.0–52.0)
Hemoglobin: 9.6 g/dL — ABNORMAL LOW (ref 13.0–17.0)
MCH: 26.8 pg (ref 26.0–34.0)
MCHC: 32.2 g/dL (ref 30.0–36.0)
MCV: 83.2 fL (ref 78.0–100.0)
PLATELETS: 199 10*3/uL (ref 150–400)
RBC: 3.58 MIL/uL — AB (ref 4.22–5.81)
RDW: 13.1 % (ref 11.5–15.5)
WBC: 10 10*3/uL (ref 4.0–10.5)

## 2016-01-13 MED ORDER — ASPIRIN 81 MG PO TABS: 81.0000 mg | ORAL_TABLET | Freq: Two times a day (BID) | ORAL | 1 refills | Status: DC

## 2016-01-13 MED ORDER — METHOCARBAMOL 500 MG PO TABS: 500.0000 mg | ORAL_TABLET | Freq: Four times a day (QID) | ORAL | 0 refills | Status: DC | PRN

## 2016-01-13 NOTE — Progress Notes (Signed)
Physical Therapy Treatment Patient Details Name: John [name] MRN: [id number] DOB: May 03, 1940 Today's Date: 01/13/2016    History of Present Illness 75 y.o. male admitted to [facility] on 01/12/16 for elective R TKA.  Pt with significant PMHx of bladder CA s/p surgery, urinary urgency, HTN, dyspnea, DM, anemia, and bil THA.      PT Comments    Pt is progressing very well with his mobility.  Supervision for gait and stair training to simulate home entry.  Pt will likely d/c home after lunch today and does not need a second therapy session prior to d/c.  PT will continue to follow acutely until d/c confirmed.   Follow Up Recommendations  Home health PT;Supervision for mobility/OOB     Equipment Recommendations  None recommended by PT    Recommendations for Other Services   NA     Precautions / Restrictions Precautions Precautions: Knee Precaution Booklet Issued: Yes (comment) Precaution Comments: knee precaution reviewed Restrictions Weight Bearing Restrictions: Yes RLE Weight Bearing: Weight bearing as tolerated    Mobility  Bed Mobility Overal bed mobility: Modified Independent Bed Mobility: Supine to Sit     Supine to sit: Modified independent (Device/Increase time);HOB elevated     General bed mobility comments: Pt able to get to EOB easily, did not use rails today, but HOB was elevated ~35 degrees  Transfers Overall transfer level: Needs assistance Equipment used: Rolling walker (2 wheeled) Transfers: Sit to/from Stand Sit to Stand: Modified independent (Device/Increase time)         General transfer comment: Pt usign safe hand placement technique during transitions.  A bit of a fast descent to sit.   Ambulation/Gait Ambulation/Gait assistance: Supervision Ambulation Distance (Feet): 515 Feet Assistive device: Rolling walker (2 wheeled) Gait Pattern/deviations: Step-through pattern;Antalgic     General Gait Details: Very mildly antalgic gait pattern.  Verbal  cues for upright posture.  Good heel to toe sequencing.    Stairs Stairs: Yes Stairs assistance: Supervision Stair Management: No rails;Step to pattern;Forwards;With walker Number of Stairs: 1 General stair comments: One curb step reviewed with RW to simulate home entry.  Verbal cues for correct LE sequencing.  Supervision for safety.          Balance Overall balance assessment: Needs assistance Sitting-balance support: Feet supported;No upper extremity supported Sitting balance-Leahy Scale: Good     Standing balance support: Bilateral upper extremity supported;No upper extremity supported;Single extremity supported Standing balance-Leahy Scale: Fair                      Cognition Arousal/Alertness: Awake/alert Behavior During Therapy: WFL for tasks assessed/performed Overall Cognitive Status: Within Functional Limits for tasks assessed                      Exercises Total Joint Exercises Ankle Circles/Pumps: AROM;Both;20 reps Quad Sets: AROM;Right;10 reps Towel Squeeze: AROM;Both;10 reps Short Arc Quad: AROM;Right;10 reps Heel Slides: AAROM;Right;10 reps Hip ABduction/ADduction: AROM;Right;10 reps Straight Leg Raises: AROM;Right;10 reps Long Arc Quad: AROM;Right;10 reps Knee Flexion: AROM;AAROM;Right;10 reps;Seated Goniometric ROM: 5-90        Pertinent Vitals/Pain Pain Assessment: 0-10 Pain Score: 2  Pain Location: right knee Pain Descriptors / Indicators: Aching;Burning;Grimacing;Guarding Pain Intervention(s): Limited activity within patient's tolerance;Monitored during session;Repositioned;RN gave pain meds during session;Ice applied           PT Goals (current goals can now be found in the care plan section) Acute Rehab PT Goals Patient Stated Goal: to go home after  lunch Progress towards PT goals: Progressing toward goals    Frequency    7X/week      PT Plan Current plan remains appropriate       End of Session Equipment  Utilized During Treatment: Gait belt Activity Tolerance: Patient tolerated treatment well Patient left: in chair;with call bell/phone within reach     Time: 1003-1033 PT Time Calculation (min) (ACUTE ONLY): 30 min  Charges:  $Gait Training: 8-22 mins $Therapeutic Exercise: 8-22 mins            Jerrie Schussler B. Lynniah Janoski, [place], DPT [phone]   01/13/2016, 10:46 AM

## 2016-01-13 NOTE — Progress Notes (Signed)
Inpatient Diabetes Program Recommendations  AACE/ADA: New Consensus Statement on Inpatient Glycemic Control (2015)  Target Ranges:  Prepandial:   less than 140 mg/dL      Peak postprandial:   less than 180 mg/dL (1-2 hours)      Critically ill patients:  140 - 180 mg/dL  Results for DAZION, WYZYKOWSKI (MRN [id number]) as of 01/13/2016 08:10  Ref. Range 01/12/2016 09:48 01/12/2016 15:31 01/12/2016 21:09 01/13/2016 06:16  Glucose-Capillary Latest Ref Range: 65 - 99 mg/dL 150 (H) 175 (H) 294 (H) 161 (H)    Review of Glycemic Control  Diabetes history: DM2 Outpatient Diabetes medications: Janumet 50-1000 mg daily Current orders for Inpatient glycemic control: Novolog 0-5 units QHS, Tradjenta 5 mg QAM, Metformin XR 1000 mg QAM  Inpatient Diabetes Program Recommendations: Correction (SSI): While inpatient, please consider ordering CBGs with Novolog correction scale TID with meals.  Thanks, Barnie Alderman, RN, MSN, CDE Diabetes Coordinator Inpatient Diabetes Program [phone] (Team Pager from 8am to 5pm)

## 2016-01-13 NOTE — Evaluation (Signed)
Occupational Therapy Evaluation Patient Details Name: John [name] MRN: [id number] DOB: 04/12/1940 Today's Date: 01/13/2016    History of Present Illness 75 y.o. male admitted to [facility] on 01/12/16 for elective R TKA.  Pt with significant PMHx of bladder CA s/p surgery, urinary urgency, HTN, dyspnea, DM, anemia, and bil THA.     Clinical Impression   Pt currently supervision to modified independent for selfcare tasks sit to stand and for functional mobility.  Recommend shower seat, which wife will purchase from outside vendor.  Pt home later today, no follow-up OT recommended.     Follow Up Recommendations  No OT follow up    Equipment Recommendations  Other (comment);Tub/shower seat (family will purchase from outside vendor)       Precautions / Restrictions Precautions Precautions: Knee Precaution Booklet Issued: Yes (comment) Precaution Comments: knee precaution reviewed Restrictions Weight Bearing Restrictions: No RLE Weight Bearing: Weight bearing as tolerated      Mobility Bed Mobility Overal bed mobility: Modified Independent Bed Mobility: Supine to Sit     Supine to sit: Modified independent (Device/Increase time);HOB elevated     General bed mobility comments: Pt able to get to EOB easily, did not use rails today, but HOB was elevated ~35 degrees  Transfers Overall transfer level: Modified independent Equipment used: Rolling walker (2 wheeled) Transfers: Sit to/from [facility] Sit to Stand: Modified independent (Device/Increase time) Stand pivot transfers: Modified independent (Device/Increase time)       General transfer comment: Pt usign safe hand placement technique during transitions.  A bit of a fast descent to sit.     Balance Overall balance assessment: Needs assistance Sitting-balance support: Feet supported;No upper extremity supported Sitting balance-Leahy Scale: Good     Standing balance support: Bilateral upper extremity  supported;No upper extremity supported;Single extremity supported Standing balance-Leahy Scale: Fair                              ADL Overall ADL's : Needs assistance/impaired Eating/Feeding: Independent   Grooming: Wash/dry hands;Wash/dry face;Modified independent;Standing   Upper Body Bathing: Set up;Sitting   Lower Body Bathing: Supervison/ safety;Sit to/from stand   Upper Body Dressing : Supervision/safety;Sitting   Lower Body Dressing: Set up;Sit to/from stand   Toilet Transfer: RW;Modified Independent;BSC;Ambulation   Toileting- Water quality scientist and Hygiene: Modified independent;Sit to/from stand   Tub/ Banker: Walk-in shower;Shower seat;Ambulation;Rolling walker   Functional mobility during ADLs: Modified independent;Rolling walker General ADL Comments: Pt modified independent for functional mobility during selfcare tasks.     Vision Vision Assessment?: No apparent visual deficits   Perception Perception Perception Tested?: No   Praxis Praxis Praxis tested?: Not tested    Pertinent Vitals/Pain Pain Assessment: Faces Pain Score: 2  Faces Pain Scale: Hurts a little bit Pain Location: right knee Pain Descriptors / Indicators: Discomfort Pain Intervention(s): Limited activity within patient's tolerance;Monitored during session     Hand Dominance Right   Extremity/Trunk Assessment Upper Extremity Assessment Upper Extremity Assessment: Overall WFL for tasks assessed   Lower Extremity Assessment Lower Extremity Assessment: Defer to PT evaluation   Cervical / Trunk Assessment Cervical / Trunk Assessment: Normal   Communication Communication Communication: HOH   Cognition Arousal/Alertness: Awake/alert Behavior During Therapy: WFL for tasks assessed/performed Overall Cognitive Status: Within Functional Limits for tasks assessed                        Exercises Exercises: Total  Joint          Home Living  Family/patient expects to be discharged to:: Private residence Living Arrangements: Spouse/significant other Available Help at Discharge: Family;Available 24 hours/day Type of Home: House Home Access: Stairs to enter [employer] of Steps: 1 (small stoop) Entrance Stairs-Rails: None Home Layout: One level     Bathroom Shower/Tub: Occupational psychologist: Standard     Home Equipment: Environmental consultant - 2 wheels;Cane - single point;Bedside commode (pt is borrowing a BSC from a friend)          Prior Functioning/Environment Level of Independence: Independent with assistive device(s)        Comments: used cane PRN              OT Goals(Current goals can be found in the care plan section) Acute Rehab OT Goals Patient Stated Goal: to go home after lunch  OT Frequency:                End of Session Equipment Utilized During Treatment: Rolling walker Nurse Communication: Mobility status  Activity Tolerance: Patient tolerated treatment well Patient left: in chair;with family/visitor present;with call bell/phone within reach   Time: 1320-1340 OT Time Calculation (min): 20 min Charges:  OT General Charges $OT Visit: 1 Procedure OT Evaluation $OT Eval Low Complexity: 1 Procedure  Jordi Lacko OTR/L 01/13/2016, 1:50 PM

## 2016-01-13 NOTE — Care Management Note (Signed)
Case Management Note  Patient Details  Name: John [name] MRN: [id number] Date of Birth: 1940-10-22  Subjective/Objective:   75 yr old male s/p right total knee arthroplasty.                 Action/Plan: Patient was preoperatively setup with [facility], no changes. Patient will have family support.   Expected Discharge Date:    01/13/16              Expected Discharge Plan:  [place]  In-House Referral:     Discharge planning Services  CM Consult  Post Acute Care Choice:  Home Health Choice offered to:  Patient  DME Arranged:  N/A DME Agency:  NA  HH Arranged:  PT [place] Agency:  [facility] (now [facility])  Status of Service:  Completed, signed off  If discussed at [employer] of Stay Meetings, dates discussed:    Additional Comments:  Ninfa Meeker, RN 01/13/2016, 1:00 PM

## 2016-01-13 NOTE — Discharge Summary (Signed)
Physician Discharge Summary  Patient ID: John [name] MRN: [id number] DOB/AGE: 1940/12/17 75 y.o.  Admit date: 01/12/2016 Discharge date: 01/13/2016  Admission Diagnoses:  Osteoarthritis of right knee  Discharge Diagnoses:  Principal Problem:   Osteoarthritis of right knee   Past Medical History:  Diagnosis Date  . Anemia   . Cancer ([place])    bladder  . Cataracts, bilateral   . Diabetes mellitus without complication ([place])    takes Janumet daily  . Dyspnea   . Heart murmur   . History of colon polyps    benign  . Hyperlipidemia    takes Lovastatin daily  . Hypertension    takes Hyzaar and Amlodipine daily  . Joint pain   . Muscle cramps    occasionally in legs   . Osteoarthritis   . Urinary urgency     Surgeries: Procedure(s): RIGHT  TOTAL KNEE ARTHROPLASTY WITH COMPUTER NAVIGATION on 01/12/2016   Consultants (if any):   Discharged Condition: Improved  Hospital Course: John [name] is an 75 y.o. male who was admitted 01/12/2016 with a diagnosis of Osteoarthritis of right knee and went to the operating room on 01/12/2016 and underwent the above named procedures.    He was given perioperative antibiotics:  Anti-infectives    Start     Dose/Rate Route Frequency Ordered Stop   01/12/16 1800  ceFAZolin (ANCEF) IVPB 2g/100 mL premix     2 g 200 mL/hr over 30 Minutes Intravenous Every 6 hours 01/12/16 1620 01/13/16 0100   01/12/16 0942  ceFAZolin (ANCEF) IVPB 2g/100 mL premix     2 g 200 mL/hr over 30 Minutes Intravenous On call to O.R. 01/12/16 [id number] 01/12/16 1155    .  He was given sequential compression devices, early ambulation, and ASA for DVT prophylaxis.  He benefited maximally from the hospital stay and there were no complications.    Recent vital signs:  Vitals:   01/12/16 2112 01/13/16 0100  BP: 134/72 132/79  Pulse: 92 86  Resp: 16 16  Temp: 97.7 F (36.5 C) 97.6 F (36.4 C)    Recent laboratory studies:  Lab Results  Component Value Date    HGB 9.6 (L) 01/13/2016   HGB 12.4 (L) 01/05/2016   HGB 9.8 (L) 02/02/2010   Lab Results  Component Value Date   WBC 10.0 01/13/2016   PLT 199 01/13/2016   Lab Results  Component Value Date   INR 1.65 (H) 02/02/2010   Lab Results  Component Value Date   NA 138 01/13/2016   K 3.9 01/13/2016   CL 102 01/13/2016   CO2 27 01/13/2016   BUN 24 (H) 01/13/2016   CREATININE 1.02 01/13/2016   GLUCOSE 190 (H) 01/13/2016    Discharge Medications:     Medication List    TAKE these medications   amLODipine 5 MG tablet Commonly known as:  NORVASC Take 1 tablet (5 mg total) by mouth daily.   aspirin 81 MG tablet Take 1 tablet (81 mg total) by mouth 2 (two) times daily after a meal.   docusate sodium 100 MG capsule Commonly known as:  COLACE Take 1 capsule (100 mg total) by mouth 2 (two) times daily.   HYDROcodone-acetaminophen 5-325 MG tablet Commonly known as:  NORCO Take 1-2 tablets by mouth every 4 (four) hours as needed for moderate pain.   losartan-hydrochlorothiazide 50-12.5 MG tablet Commonly known as:  HYZAAR Take 1 tablet by mouth daily.   lovastatin 40 MG tablet Commonly known as:  MEVACOR Take  40 mg by mouth daily.   methocarbamol 500 MG tablet Commonly known as:  ROBAXIN Take 1 tablet (500 mg total) by mouth every 6 (six) hours as needed for muscle spasms.   naproxen sodium 220 MG tablet Commonly known as:  ANAPROX Take 220 mg by mouth 2 (two) times daily with a meal.   ondansetron 4 MG tablet Commonly known as:  ZOFRAN Take 1 tablet (4 mg total) by mouth every 8 (eight) hours as needed for nausea or vomiting.   RA VITAMIN B-12 TR 1000 MCG Tbcr Generic drug:  Cyanocobalamin Take 500 mcg by mouth daily.   senna 8.6 MG Tabs tablet Commonly known as:  SENOKOT Take 2 tablets (17.2 mg total) by mouth at bedtime.   SitaGLIPtin-MetFORMIN HCl 50-1000 MG Tb24 Commonly known as:  JANUMET XR Take 1 tablet by mouth 2 (two) times daily. What changed:  when  to take this   Vitamin D 2000 units tablet Take 2,000 Units by mouth daily.            Durable Medical Equipment        Start     Ordered   01/12/16 1621  DME Bedside commode  Once     01/12/16 1620   01/12/16 1621  DME 3 n 1  Once     01/12/16 1620   01/12/16 1621  DME Walker rolling  Once     01/12/16 1620      Diagnostic Studies: Dg Knee Right Port  Result Date: 01/12/2016 CLINICAL DATA:  Status post total knee replacement EXAM: PORTABLE RIGHT KNEE - 1-2 VIEW COMPARISON:  None. FINDINGS: Frontal and lateral views were obtained. The patient is status post total knee replacement with femoral and tibial prosthetic components well-seated. No fracture or dislocation evident. There is a drain laterally in the joint. Air in the joint is an expected postoperative finding. IMPRESSION: No acute fracture or dislocation. Prosthetic components appear well seated. Electronically Signed   By: Lowella Grip III M.D.   On: 01/12/2016 15:55    Disposition:     Follow-up Information    Zakya Halabi, [name], MD. Schedule an appointment as soon as possible for a visit in 2 weeks.   Specialty:  Orthopedic Surgery Why:  For wound re-check Contact information: [address] [address] [phone]            Signed: Elie Goody 01/13/2016, 7:55 AM

## 2016-01-13 NOTE — Consult Note (Signed)
            [facility] CM Primary Care Navigator  01/13/2016  Divonte Fitzmorris 1940/12/07 [id number]   Wentto see patient in room to identify possible discharge needs butpatient had been discharged.  Primary care provider's office was contacted(Britney) to notify of patient's discharge and need for post hospital follow-up and transition of care. Made aware to refer patient to [facility] care management for care coordination needs if deemed appropriate.   For additional questions please contact:  Edwena Felty A. Tysean [place], BSN, RN-BC [facility] PRIMARY CARE Navigator Cell: [phone]

## 2016-01-13 NOTE — Progress Notes (Signed)
D/C teachings done both written and verbal with pt and wife. Pt and wife verb understanding of all teachings and agree to comply. All questions answered. Pt pain controlled upon discharge to home. Pt given prescriptions for aspirin and robaxin. Pt's wife has all d/c meds filled and at home. No change in patient from AM assessment upon discharge to home. Pt discharged to home via wheelchair without incident with wife per MD order.

## 2016-01-13 NOTE — Progress Notes (Signed)
   Subjective:  Patient reports pain as mild to moderate.  Wants to go home  Objective:   VITALS:   Vitals:   01/12/16 1608 01/12/16 1632 01/12/16 2112 01/13/16 0100  BP:  136/75 134/72 132/79  Pulse: 85 89 92 86  Resp: 15  16 16   Temp: 98 F (36.7 C) 97.5 F (36.4 C) 97.7 F (36.5 C) 97.6 F (36.4 C)  TempSrc:  Oral Oral Oral  SpO2: 97% 93% 94% 93%  Weight:      Height:        NAD Neurologically intact ABD soft Sensation intact distally Intact pulses distally Dorsiflexion/Plantar flexion intact Incision: dressing C/D/I Compartment soft    Lab Results  Component Value Date   WBC 10.0 01/13/2016   HGB 9.6 (L) 01/13/2016   HCT 29.8 (L) 01/13/2016   MCV 83.2 01/13/2016   PLT 199 01/13/2016   BMET    Component Value Date/Time   NA 138 01/13/2016 0338   NA 143 11/25/2015 0830   NA 139 12/13/2012 1425   K 3.9 01/13/2016 0338   K 3.4 (L) 12/13/2012 1425   CL 102 01/13/2016 0338   CL 103 12/13/2012 1425   CO2 27 01/13/2016 0338   CO2 33 (H) 12/13/2012 1425   GLUCOSE 190 (H) 01/13/2016 0338   GLUCOSE 151 (H) 12/13/2012 1425   BUN 24 (H) 01/13/2016 0338   BUN 20 11/25/2015 0830   BUN 17 12/13/2012 1425   CREATININE 1.02 01/13/2016 0338   CREATININE 0.98 12/13/2012 1425   CALCIUM 8.0 (L) 01/13/2016 0338   CALCIUM 9.0 12/13/2012 1425   GFRNONAA >60 01/13/2016 0338   GFRNONAA >60 12/13/2012 1425   GFRAA >60 01/13/2016 0338   GFRAA >60 12/13/2012 1425     Assessment/Plan: 1 Day Post-Op   Principal Problem:   Osteoarthritis of right knee     WBAT with walker DVT ppx: ASA, SCDs, TEDs PO pain control PT/OT Dispo: D/C home with HHPT    John [name], [name] 01/13/2016, 7:52 AM   Rod Can, MD Cell [phone]

## 2016-01-14 ENCOUNTER — Telehealth: Payer: Self-pay

## 2016-01-14 DIAGNOSIS — Z96651 Presence of right artificial knee joint: Secondary | ICD-10-CM | POA: Diagnosis not present

## 2016-01-14 DIAGNOSIS — I10 Essential (primary) hypertension: Secondary | ICD-10-CM | POA: Diagnosis not present

## 2016-01-14 DIAGNOSIS — I350 Nonrheumatic aortic (valve) stenosis: Secondary | ICD-10-CM | POA: Diagnosis not present

## 2016-01-14 DIAGNOSIS — Z471 Aftercare following joint replacement surgery: Secondary | ICD-10-CM | POA: Diagnosis not present

## 2016-01-14 DIAGNOSIS — C679 Malignant neoplasm of bladder, unspecified: Secondary | ICD-10-CM | POA: Diagnosis not present

## 2016-01-14 DIAGNOSIS — Z7901 Long term (current) use of anticoagulants: Secondary | ICD-10-CM | POA: Diagnosis not present

## 2016-01-14 DIAGNOSIS — D649 Anemia, unspecified: Secondary | ICD-10-CM | POA: Diagnosis not present

## 2016-01-14 DIAGNOSIS — Z96649 Presence of unspecified artificial hip joint: Secondary | ICD-10-CM | POA: Diagnosis not present

## 2016-01-14 DIAGNOSIS — Z7982 Long term (current) use of aspirin: Secondary | ICD-10-CM | POA: Diagnosis not present

## 2016-01-14 DIAGNOSIS — N181 Chronic kidney disease, stage 1: Secondary | ICD-10-CM | POA: Diagnosis not present

## 2016-01-14 DIAGNOSIS — E1122 Type 2 diabetes mellitus with diabetic chronic kidney disease: Secondary | ICD-10-CM | POA: Diagnosis not present

## 2016-01-14 DIAGNOSIS — Z791 Long term (current) use of non-steroidal anti-inflammatories (NSAID): Secondary | ICD-10-CM | POA: Diagnosis not present

## 2016-01-14 DIAGNOSIS — E785 Hyperlipidemia, unspecified: Secondary | ICD-10-CM | POA: Diagnosis not present

## 2016-01-14 NOTE — Telephone Encounter (Signed)
Lorine, a primary care coordinator at [facility] called and left me a VM stating that this patient was discharged from the hospital yesterdat with home health and that he need a follow up appointment with [place] and transition of care. I called the patient and patient stated he was doing good. He stated that he was waiting on a call to get physical therapy set up and that they should be doing that soon. Scheduled patient's hospital f/up with [place] for 02/06/16. Will route to provider for FYI.

## 2016-01-19 DIAGNOSIS — Z7982 Long term (current) use of aspirin: Secondary | ICD-10-CM | POA: Diagnosis not present

## 2016-01-19 DIAGNOSIS — E785 Hyperlipidemia, unspecified: Secondary | ICD-10-CM | POA: Diagnosis not present

## 2016-01-19 DIAGNOSIS — C679 Malignant neoplasm of bladder, unspecified: Secondary | ICD-10-CM | POA: Diagnosis not present

## 2016-01-19 DIAGNOSIS — I350 Nonrheumatic aortic (valve) stenosis: Secondary | ICD-10-CM | POA: Diagnosis not present

## 2016-01-19 DIAGNOSIS — Z96651 Presence of right artificial knee joint: Secondary | ICD-10-CM | POA: Diagnosis not present

## 2016-01-19 DIAGNOSIS — Z96649 Presence of unspecified artificial hip joint: Secondary | ICD-10-CM | POA: Diagnosis not present

## 2016-01-19 DIAGNOSIS — I10 Essential (primary) hypertension: Secondary | ICD-10-CM | POA: Diagnosis not present

## 2016-01-19 DIAGNOSIS — N181 Chronic kidney disease, stage 1: Secondary | ICD-10-CM | POA: Diagnosis not present

## 2016-01-19 DIAGNOSIS — D649 Anemia, unspecified: Secondary | ICD-10-CM | POA: Diagnosis not present

## 2016-01-19 DIAGNOSIS — Z7901 Long term (current) use of anticoagulants: Secondary | ICD-10-CM | POA: Diagnosis not present

## 2016-01-19 DIAGNOSIS — E1122 Type 2 diabetes mellitus with diabetic chronic kidney disease: Secondary | ICD-10-CM | POA: Diagnosis not present

## 2016-01-19 DIAGNOSIS — Z471 Aftercare following joint replacement surgery: Secondary | ICD-10-CM | POA: Diagnosis not present

## 2016-01-19 DIAGNOSIS — Z791 Long term (current) use of non-steroidal anti-inflammatories (NSAID): Secondary | ICD-10-CM | POA: Diagnosis not present

## 2016-01-28 ENCOUNTER — Ambulatory Visit: Payer: PPO | Attending: Orthopedic Surgery | Admitting: Physical Therapy

## 2016-01-28 ENCOUNTER — Encounter: Payer: Self-pay | Admitting: Physical Therapy

## 2016-01-28 DIAGNOSIS — R262 Difficulty in walking, not elsewhere classified: Secondary | ICD-10-CM | POA: Insufficient documentation

## 2016-01-28 DIAGNOSIS — M6281 Muscle weakness (generalized): Secondary | ICD-10-CM | POA: Insufficient documentation

## 2016-01-28 NOTE — Therapy (Signed)
[place] MAIN [facility] SERVICES [address] [place], [place], [address] Phone: [phone]   Fax:  [phone]  Physical Therapy Evaluation  Patient Details  Name: John [name] MRN: [id number] Date of Birth: 1941/01/17 Referring Provider: Rod Can   Encounter Date: 01/28/2016      PT End of Session - 01/28/16 1149    Visit Number 1   Number of Visits 17   Date for PT Re-Evaluation Mar 30, 2016   Authorization Type g codes 1   Authorization Time Period 1   PT Start Time 1130   PT Stop Time 1230   PT Time Calculation (min) 60 min   Equipment Utilized During Treatment Gait belt   Activity Tolerance Patient tolerated treatment well;Patient limited by fatigue;Patient limited by pain   Behavior During Therapy [facility] for tasks assessed/performed      Past Medical History:  Diagnosis Date  . Anemia   . Cancer ([place])    bladder  . Cataracts, bilateral   . Diabetes mellitus without complication ([place])    takes Janumet daily  . Dyspnea   . Heart murmur   . History of colon polyps    benign  . Hyperlipidemia    takes Lovastatin daily  . Hypertension    takes Hyzaar and Amlodipine daily  . Joint pain   . Muscle cramps    occasionally in legs   . Osteoarthritis   . Urinary urgency     Past Surgical History:  Procedure Laterality Date  . BLADDER SURGERY    . COLONOSCOPY    . JOINT REPLACEMENT Left    hip  . KNEE ARTHROPLASTY Right 01/12/2016   Procedure: RIGHT  TOTAL KNEE ARTHROPLASTY WITH COMPUTER NAVIGATION;  Surgeon: Rod Can, MD;  Location: [place];  Service: Orthopedics;  Laterality: Right;  Needs RNFA  . SKIN SURGERY Left    arm    There were no vitals filed for this visit.       Subjective Assessment - 01/28/16 1146    Subjective Patient had surgery Jan 12, 2016 and is not walking like he wants to and he is having pain in his right knee   Patient is accompained by: Family member   Pertinent History Patient had  pain for 3 year and then had TKR Jan 12, 2016 and had HHPT at home for 2 weeks   Limitations Sitting;Standing;Walking   How long can you sit comfortably? 30 mins   How long can you stand comfortably? 10  mins   How long can you walk comfortably? 10 mins   Currently in Pain? Yes   Pain Score 6    Pain Location Knee   Pain Orientation Right   Pain Descriptors / Indicators Aching   Pain Type Chronic pain   Pain Onset 1 to 4 weeks ago   Pain Frequency Constant   Aggravating Factors  walking, sitting, bending his knee   Pain Relieving Factors ice, medicine   Multiple Pain Sites No            [facility] PT Assessment - 01/28/16 0001      Assessment   Medical Diagnosis R TKR   Referring Provider Lyla Glassing, BRIAN    Onset Date/Surgical Date 01/12/16   Hand Dominance Left   Next MD Visit 02/24/16   Prior Therapy HHPT     Precautions   Precautions None     Restrictions   Weight Bearing Restrictions No     Balance Screen   Has  the patient fallen in the past 6 months No   Has the patient had a decrease in activity level because of a fear of falling?  Yes   Is the patient reluctant to leave their home because of a fear of falling?  No     Home Social worker Private residence   Living Arrangements Spouse/significant other   Available Help at Discharge Family   Type of [place] Access Level entry   [place] One level   [place] - 2 wheels;Bedside commode;[place] - single point     Prior Function   Level of Independence Independent with basic ADLs;Independent with household mobility with device   Vocation Retired   Leisure yard work, work on Psychiatric nurse Status Within [employer] for tasks assessed   Attention Focused       PAIN: 6/10 right knee  POSTURE: WNL   PROM/AROM: R knee - 10 to 92 deg AROM   STRENGTH:  Graded on a 0-5 scale Muscle Group Left Right                          Hip Flex  4/5 4/5  Hip Abd 4/5 4/5  Hip Add 3/5 3/5  Hip Ext nt nt  Hip IR/ER 4/5 4/5  Knee Flex 4/5 4/5  Knee Ext 4/5 4/5  Ankle DF 5/5 5/5  Ankle PF 5/5 5/5   SENSATION:WFL   FUNCTIONAL MOBILITY:independent   BALANCE:Dynamic balance is poor,  unable to single leg stand or tandem stand   GAIT: Ambulates with spc intermediate and short distances   OUTCOME MEASURES: TEST Outcome Interpretation  5 times sit<>stand 16.25sec >87 yo, >15 sec indicates increased risk for falls  10 meter walk test    .95           m/s <1.0 m/s indicates increased risk for falls; limited community ambulator  Timed up and Go       14.05          sec <14 sec indicates increased risk for falls  6 minute walk test    900            Feet 1000 feet is community ambulator            Therapeutic exercises: Seated knee flex  Seated hamstring stretch x 1 minute Nu-step x 5 mins  No reports of increased pain.                      PT Education - 01/28/16 1148    Education provided Yes   Education Details plan of care, goals   Person(s) Educated Patient   Methods Explanation   Comprehension Verbalized understanding             PT Long Term Goals - 01/28/16 1152      PT LONG TERM GOAL #1   Title Patient will report a worst pain of 3/10 on VAS in right knee            to improve tolerance with ADLs and reduced symptoms with activities.    Time 8   Period Weeks   Status New     PT LONG TERM GOAL #2   Title Patient will be independent in home exercise program to improve strength/mobility for better functional independence with ADLs.   Time 8   Period  Weeks   Status New     PT LONG TERM GOAL #3   Title Patient will increase BLE gross strength to 4+/5 as to improve functional strength for independent gait, increased standing tolerance and increased ADL ability.   Time 8   Period Weeks   Status New     PT LONG TERM GOAL #4   Title Patient (> 53 years old) will complete five times  sit to stand test in < 15 seconds indicating an increased LE strength and improved balance.   Time 8   Period Weeks   Status New     PT LONG TERM GOAL #5   Title Patient will increase six minute walk test distance to >1000 for progression to community ambulator and improve gait ability   Time 8   Period Weeks   Status New               Plan - 02-19-16 1149    Clinical Impression Statement Patient is s/p TKR surgery Jan 12, 2016 and has decreased strength and decreased ROM right knee. He is able to ambulate short distances with spc.    Rehab Potential Good   Clinical Impairments Affecting Rehab Potential diabetes, bladder CA, high blood pressure, Letf hip THR 2011   PT Frequency 2x / week   PT Duration 8 weeks   PT Treatment/Interventions Cryotherapy;Electrical Stimulation;Moist Heat;Therapeutic activities;Gait training;Therapeutic exercise;Patient/family education;Passive range of motion   PT Next Visit Plan ther ex   PT Home Exercise Plan knee flex seated   Consulted and Agree with Plan of Care Patient;Family member/caregiver      Patient will benefit from skilled therapeutic intervention in order to improve the following deficits and impairments:  Abnormal gait, Decreased mobility, Difficulty walking, Decreased range of motion, Pain, Decreased strength, Decreased activity tolerance  Visit Diagnosis: Difficulty in walking, not elsewhere classified  Muscle weakness (generalized)      G-Codes - 02/19/2016 1157    Functional Assessment Tool Used TUg, 10 MW, 6 MW, 5 x sit to stand   Functional Limitation Mobility: Walking and moving around   Mobility: Walking and Moving Around Current Status [phone]) At least 40 percent but less than 60 percent impaired, limited or restricted   Mobility: Walking and Moving Around Goal Status [phone]) At least 20 percent but less than 40 percent impaired, limited or restricted       Problem List Patient Active Problem List   Diagnosis  Date Noted  . Osteoarthritis of right knee 01/12/2016  . Aortic valve stenosis, moderate 05/30/2015  . Bladder cancer ([place]) 11/15/2014  . Hyperlipidemia 11/15/2014  . ED (erectile dysfunction) 11/15/2014  . Osteoarthritis 11/15/2014  . Hypertension 11/15/2014  . Heart murmur 11/15/2014  . 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 Anemia 11/15/2014   Alanson Puls, PT, DPT [place], [place] S 02-19-16, 12:20 PM  [place] MAIN [facility] SERVICES [address] [place], [place], [address] Phone: [phone]   Fax:  [phone]  Name: Griffey [place] MRN: [id number] Date of Birth: 01-Jun-1940

## 2016-02-02 ENCOUNTER — Ambulatory Visit: Payer: PPO | Attending: Orthopedic Surgery | Admitting: Physical Therapy

## 2016-02-02 ENCOUNTER — Encounter: Payer: Self-pay | Admitting: Physical Therapy

## 2016-02-02 DIAGNOSIS — R262 Difficulty in walking, not elsewhere classified: Secondary | ICD-10-CM | POA: Diagnosis not present

## 2016-02-02 DIAGNOSIS — M6281 Muscle weakness (generalized): Secondary | ICD-10-CM | POA: Insufficient documentation

## 2016-02-02 NOTE — Therapy (Addendum)
[place] MAIN [facility] SERVICES [address] [place], [place], [address] Phone: [phone]   Fax:  [phone]  Physical Therapy Treatment  Patient Details  Name: John [name] MRN: [id number] Date of Birth: Jun 23, 1940 Referring Provider: Rod Can   Encounter Date: 02/02/2016      PT End of Session - 02/02/16 1537    Visit Number 2   Number of Visits 17   Date for PT Re-Evaluation 04/10/16   Authorization Type g codes 2   Authorization Time Period 2   PT Start Time 0315   PT Stop Time 0400   PT Time Calculation (min) 45 min   Equipment Utilized During Treatment Gait belt   Activity Tolerance Patient tolerated treatment well;Patient limited by fatigue;Patient limited by pain   Behavior During Therapy [facility] for tasks assessed/performed      Past Medical History:  Diagnosis Date  . Anemia   . Cancer ([place])    bladder  . Cataracts, bilateral   . Diabetes mellitus without complication ([place])    takes Janumet daily  . Dyspnea   . Heart murmur   . History of colon polyps    benign  . Hyperlipidemia    takes Lovastatin daily  . Hypertension    takes Hyzaar and Amlodipine daily  . Joint pain   . Muscle cramps    occasionally in legs   . Osteoarthritis   . Urinary urgency     Past Surgical History:  Procedure Laterality Date  . BLADDER SURGERY    . COLONOSCOPY    . JOINT REPLACEMENT Left    hip  . KNEE ARTHROPLASTY Right 01/12/2016   Procedure: RIGHT  TOTAL KNEE ARTHROPLASTY WITH COMPUTER NAVIGATION;  Surgeon: Rod Can, MD;  Location: [place];  Service: Orthopedics;  Laterality: Right;  Needs RNFA  . SKIN SURGERY Left    arm    There were no vitals filed for this visit.      Subjective Assessment - 02/02/16 1536    Subjective Patient is having 4/10 pain in right knee. He reports that he is not able to do the seated hamstring stretch at home because it is too painful.    Patient is accompained by: Family member    Pertinent History Patient had pain for 3 year and then had TKR Jan 12, 2016 and had HHPT at home for 2 weeks   Limitations Sitting;Standing;Walking   How long can you sit comfortably? 30 mins   How long can you stand comfortably? 10  mins   How long can you walk comfortably? 10 mins   Currently in Pain? Yes   Pain Score 4    Pain Location Knee   Pain Orientation Right   Pain Onset 1 to 4 weeks ago   Multiple Pain Sites No      Therapeutic exercise: LAQ, SAQ, heel slides, SLR, knee flex seated, hamstring stretch 30 x 3 sets, gastroc and soleus stretch B ankles, step ups 6 inch stool x 20, eccentric step down from 4 inch stool. Patient needs occasional verbal cueing to improve posture and cueing to correctly perform exercises slowly, holding at end of range to increase motor firing of desired muscle to encourage fatigue. ROM is -5 deg to 102 deg                           PT Education - 02/02/16 1537    Education provided Yes  Education Details ice knee   Person(s) Educated Patient   Methods Explanation   Comprehension Verbalized understanding             PT Long Term Goals - 01/28/16 1152      PT LONG TERM GOAL #1   Title Patient will report a worst pain of 3/10 on VAS in right knee            to improve tolerance with ADLs and reduced symptoms with activities.    Time 8   Period Weeks   Status New     PT LONG TERM GOAL #2   Title Patient will be independent in home exercise program to improve strength/mobility for better functional independence with ADLs.   Time 8   Period Weeks   Status New     PT LONG TERM GOAL #3   Title Patient will increase BLE gross strength to 4+/5 as to improve functional strength for independent gait, increased standing tolerance and increased ADL ability.   Time 8   Period Weeks   Status New     PT LONG TERM GOAL #4   Title Patient (> 43 years old) will complete five times sit to stand test in < 15 seconds  indicating an increased LE strength and improved balance.   Time 8   Period Weeks   Status New     PT LONG TERM GOAL #5   Title Patient will increase six minute walk test distance to >1000 for progression to community ambulator and improve gait ability   Time 8   Period Weeks   Status New               Plan - 02/02/16 1537    Clinical Impression Statement Patient is able to perform TKR exercises and PROM right knee extension and flex. Patient has -5 deg to 102 deg right knee.   Rehab Potential Good   Clinical Impairments Affecting Rehab Potential diabetes, bladder CA, high blood pressure, Letf hip THR 2011   PT Duration 8 weeks   PT Treatment/Interventions Cryotherapy;Electrical Stimulation;Moist Heat;Therapeutic activities;Gait training;Therapeutic exercise;Patient/family education;Passive range of motion   PT Next Visit Plan ther ex   PT Home Exercise Plan knee flex seated   Consulted and Agree with Plan of Care Patient;Family member/caregiver      Patient will benefit from skilled therapeutic intervention in order to improve the following deficits and impairments:  Abnormal gait, Decreased mobility, Difficulty walking, Decreased range of motion, Pain, Decreased strength, Decreased activity tolerance  Visit Diagnosis: Difficulty in walking, not elsewhere classified  Muscle weakness (generalized)     Problem List Patient Active Problem List   Diagnosis Date Noted  . Osteoarthritis of right knee 01/12/2016  . Aortic valve stenosis, moderate 05/30/2015  . Bladder cancer ([place]) 11/15/2014  . Hyperlipidemia 11/15/2014  . ED (erectile dysfunction) 11/15/2014  . Osteoarthritis 11/15/2014  . Hypertension 11/15/2014  . Heart murmur 11/15/2014  . 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 Anemia 11/15/2014    Alanson Puls 02/02/2016, 3:51 PM  [place] MAIN [facility] SERVICES [address] [place], [place], [address] Phone: [phone]   Fax:  [phone]  Name: John [name] MRN: [id number] Date of Birth: 1941-02-20

## 2016-02-04 ENCOUNTER — Ambulatory Visit: Payer: PPO | Admitting: Physical Therapy

## 2016-02-04 ENCOUNTER — Encounter: Payer: Self-pay | Admitting: Physical Therapy

## 2016-02-04 DIAGNOSIS — M6281 Muscle weakness (generalized): Secondary | ICD-10-CM

## 2016-02-04 DIAGNOSIS — R262 Difficulty in walking, not elsewhere classified: Secondary | ICD-10-CM

## 2016-02-04 NOTE — Therapy (Signed)
[place] MAIN [facility] SERVICES [address] [place], [place], [address] Phone: [phone]   Fax:  [phone]  Physical Therapy Treatment  Patient Details  Name: John [name] MRN: [id number] Date of Birth: [birth year] Referring Provider: Rod Can   Encounter Date: 02/04/2016      PT End of Session - 02/04/16 1439    Visit Number 3   Number of Visits 17   Date for PT Re-Evaluation 04-02-2016   Authorization Type g codes 3   Authorization Time Period 2   PT Start Time 0230   PT Stop Time 0315   PT Time Calculation (min) 45 min   Equipment Utilized During Treatment Gait belt   Activity Tolerance Patient tolerated treatment well;Patient limited by fatigue;Patient limited by pain   Behavior During Therapy [facility] for tasks assessed/performed      Past Medical History:  Diagnosis Date  . Anemia   . Cancer ([place])    bladder  . Cataracts, bilateral   . Diabetes mellitus without complication ([place])    takes Janumet daily  . Dyspnea   . Heart murmur   . History of colon polyps    benign  . Hyperlipidemia    takes Lovastatin daily  . Hypertension    takes Hyzaar and Amlodipine daily  . Joint pain   . Muscle cramps    occasionally in legs   . Osteoarthritis   . Urinary urgency     Past Surgical History:  Procedure Laterality Date  . BLADDER SURGERY    . COLONOSCOPY    . JOINT REPLACEMENT Left    hip  . KNEE ARTHROPLASTY Right 01/12/2016   Procedure: RIGHT  TOTAL KNEE ARTHROPLASTY WITH COMPUTER NAVIGATION;  Surgeon: Rod Can, MD;  Location: [place];  Service: Orthopedics;  Laterality: Right;  Needs RNFA  . SKIN SURGERY Left    arm    There were no vitals filed for this visit.      Subjective Assessment - 02/04/16 1438    Subjective Patient is having 5/10 pain yesterday and today it is hurting 4/10.   Patient is accompained by: Family member   Pertinent History Patient had pain for 3 year and then had TKR Jan 12, 2016  and had HHPT at home for 2 weeks   Limitations Sitting;Standing;Walking   How long can you sit comfortably? 30 mins   How long can you stand comfortably? 10  mins   How long can you walk comfortably? 10 mins   Currently in Pain? Yes   Pain Score 4    Pain Location Knee   Pain Orientation Right   Pain Descriptors / Indicators Aching   Pain Onset 1 to 4 weeks ago   Multiple Pain Sites No          Therapeutic exercise: LAQ, SAQ, heel slides, SLR, knee flex seated, hamstring stretch 30 x 3 sets, gastroc and soleus stretch B ankles, step ups 6 inch stool x 20, eccentric step down from 4 inch stool. Patient needs occasional verbal cueing to improve posture and cueing to correctly perform exercises slowly, holding at end of range to increase motor firing of desired muscle to encourage fatigue. ROM is -2 deg to 102 deg                        PT Education - 02/04/16 1438    Education provided Yes   Education Details ice   [employer]) Educated Patient  Methods Explanation             PT Long Term Goals - 01/28/16 1152      PT LONG TERM GOAL #1   Title Patient will report a worst pain of 3/10 on VAS in right knee            to improve tolerance with ADLs and reduced symptoms with activities.    Time 8   Period Weeks   Status New     PT LONG TERM GOAL #2   Title Patient will be independent in home exercise program to improve strength/mobility for better functional independence with ADLs.   Time 8   Period Weeks   Status New     PT LONG TERM GOAL #3   Title Patient will increase BLE gross strength to 4+/5 as to improve functional strength for independent gait, increased standing tolerance and increased ADL ability.   Time 8   Period Weeks   Status New     PT LONG TERM GOAL #4   Title Patient (> 54 years old) will complete five times sit to stand test in < 15 seconds indicating an increased LE strength and improved balance.   Time 8   Period Weeks    Status New     PT LONG TERM GOAL #5   Title Patient will increase six minute walk test distance to >1000 for progression to community ambulator and improve gait ability   Time 8   Period Weeks   Status New               Plan - 02/04/16 1439    Clinical Impression Statement Patient has decreased ROM and strength to right knee and decreased ambulation due to recent TKR surgery.    Rehab Potential Good   Clinical Impairments Affecting Rehab Potential diabetes, bladder CA, high blood pressure, Letf hip THR 2011   PT Duration 8 weeks   PT Treatment/Interventions Cryotherapy;Electrical Stimulation;Moist Heat;Therapeutic activities;Gait training;Therapeutic exercise;Patient/family education;Passive range of motion   PT Next Visit Plan ther ex   PT Home Exercise Plan knee flex seated   Consulted and Agree with Plan of Care Patient;Family member/caregiver      Patient will benefit from skilled therapeutic intervention in order to improve the following deficits and impairments:  Abnormal gait, Decreased mobility, Difficulty walking, Decreased range of motion, Pain, Decreased strength, Decreased activity tolerance  Visit Diagnosis: Difficulty in walking, not elsewhere classified  Muscle weakness (generalized)     Problem List Patient Active Problem List   Diagnosis Date Noted  . Osteoarthritis of right knee 01/12/2016  . Aortic valve stenosis, moderate 05/30/2015  . Bladder cancer ([place]) 11/15/2014  . Hyperlipidemia 11/15/2014  . ED (erectile dysfunction) 11/15/2014  . Osteoarthritis 11/15/2014  . Hypertension 11/15/2014  . Heart murmur 11/15/2014  . 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 Anemia 11/15/2014   Alanson Puls, PT, DPT [place], [place] S 02/04/2016, 2:40 PM  [place] MAIN [facility] SERVICES [address] [place], [place], [address] Phone: [phone]   Fax:   [phone]  Name: John [name] MRN: [id number] Date of Birth: December 31, 1940

## 2016-02-06 ENCOUNTER — Ambulatory Visit (INDEPENDENT_AMBULATORY_CARE_PROVIDER_SITE_OTHER): Payer: PPO | Admitting: Unknown Physician Specialty

## 2016-02-06 ENCOUNTER — Encounter: Payer: Self-pay | Admitting: Unknown Physician Specialty

## 2016-02-06 DIAGNOSIS — Z96651 Presence of right artificial knee joint: Secondary | ICD-10-CM

## 2016-02-06 NOTE — Assessment & Plan Note (Signed)
Recovering well without any signs of infection.  Continue Vitamin D and B12

## 2016-02-06 NOTE — Progress Notes (Signed)
BP (!) 144/75 (BP Location: Left Arm, Cuff Size: Large)   Pulse 98   Temp 98 F (36.7 C)   Ht 5' 10.5" (1.791 m) Comment: pt had shoes on  Wt 203 lb (92.1 kg) Comment: pt had shoes on  SpO2 98%   BMI 28.72 kg/m    Subjective:    Patient ID: John [name], male    DOB: 09-07-1940, 75 y.o.   MRN: [id number]  HPI: John [name] is a 75 y.o. male  Chief Complaint  Patient presents with  . Hospitalization Follow-up    pt had right knee replaced    Knee replacement - Pt is s/p knee replacement done about 3-4 weeks ago.  He is her for a routine f/u.  States he was taking Hydrocodone 3  times/day.  And is working on weaning off.  States he has enough to wean off.  Taking blood thinners and is almost finished.  He is getting home PT for 2 weeks and now going to the hospital.    Relevant past medical, surgical, family and social history reviewed and updated as indicated. Interim medical history since our last visit reviewed. Allergies and medications reviewed and updated.  Review of Systems  Per HPI unless specifically indicated above     Objective:    BP (!) 144/75 (BP Location: Left Arm, Cuff Size: Large)   Pulse 98   Temp 98 F (36.7 C)   Ht 5' 10.5" (1.791 m) Comment: pt had shoes on  Wt 203 lb (92.1 kg) Comment: pt had shoes on  SpO2 98%   BMI 28.72 kg/m   Wt Readings from Last 3 Encounters:  02/06/16 203 lb (92.1 kg)  01/12/16 211 lb (95.7 kg)  01/05/16 211 lb (95.7 kg)    Physical Exam  Constitutional: He is oriented to person, place, and time. He appears well-developed and well-nourished. No distress.  HENT:  Head: Normocephalic and atraumatic.  Eyes: Conjunctivae and lids are normal. Right eye exhibits no discharge. Left eye exhibits no discharge. No scleral icterus.  Neck: Normal range of motion. Neck supple. No JVD present. Carotid bruit is not present.  Cardiovascular: Normal rate, regular rhythm and normal heart sounds.   Pulmonary/Chest: Effort normal and  breath sounds normal. No respiratory distress.  Abdominal: Normal appearance. There is no splenomegaly or hepatomegaly.  Musculoskeletal: Normal range of motion.  Neurological: He is alert and oriented to person, place, and time.  Skin: Skin is warm, dry and intact. No rash noted. No pallor.  Psychiatric: He has a normal mood and affect. His behavior is normal. Judgment and thought content normal.    Results for orders placed or performed during the hospital encounter of 01/12/16  Glucose, capillary  Result Value Ref Range   Glucose-Capillary 150 (H) 65 - 99 mg/dL  Glucose, capillary  Result Value Ref Range   Glucose-Capillary 175 (H) 65 - 99 mg/dL  CBC  Result Value Ref Range   WBC 10.0 4.0 - 10.5 K/uL   RBC 3.58 (L) 4.22 - 5.81 MIL/uL   Hemoglobin 9.6 (L) 13.0 - 17.0 g/dL   HCT 29.8 (L) 39.0 - 52.0 %   MCV 83.2 78.0 - 100.0 fL   MCH 26.8 26.0 - 34.0 pg   MCHC 32.2 30.0 - 36.0 g/dL   RDW 13.1 11.5 - 15.5 %   Platelets 199 150 - 400 K/uL  Basic metabolic panel  Result Value Ref Range   Sodium 138 135 - 145 mmol/L   Potassium  3.9 3.5 - 5.1 mmol/L   Chloride 102 101 - 111 mmol/L   CO2 27 22 - 32 mmol/L   Glucose, Bld 190 (H) 65 - 99 mg/dL   BUN 24 (H) 6 - 20 mg/dL   Creatinine, Ser 1.02 0.61 - 1.24 mg/dL   Calcium 8.0 (L) 8.9 - 10.3 mg/dL   GFR calc non Af Amer >60 >60 mL/min   GFR calc Af Amer >60 >60 mL/min   Anion gap 9 5 - 15  Glucose, capillary  Result Value Ref Range   Glucose-Capillary 294 (H) 65 - 99 mg/dL  Glucose, capillary  Result Value Ref Range   Glucose-Capillary 161 (H) 65 - 99 mg/dL  Glucose, capillary  Result Value Ref Range   Glucose-Capillary 153 (H) 65 - 99 mg/dL      Assessment & Plan:   Problem List Items Addressed This Visit      Unprioritized   Status post right knee replacement    Recovering well without any signs of infection.  Continue Vitamin D and B12          Follow up plan: Return for rtc in March.

## 2016-02-09 ENCOUNTER — Ambulatory Visit: Payer: PPO | Admitting: Physical Therapy

## 2016-02-09 ENCOUNTER — Encounter: Payer: Self-pay | Admitting: Physical Therapy

## 2016-02-09 DIAGNOSIS — M6281 Muscle weakness (generalized): Secondary | ICD-10-CM

## 2016-02-09 DIAGNOSIS — R262 Difficulty in walking, not elsewhere classified: Secondary | ICD-10-CM

## 2016-02-09 NOTE — Therapy (Signed)
[place] MAIN [facility] SERVICES [address] [place], [place], [address] Phone: [phone]   Fax:  [phone]  Physical Therapy Treatment  Patient Details  Name: John [name] MRN: [id number] Date of Birth: [birth year] Referring Provider: Rod Can   Encounter Date: 02/09/2016    Past Medical History:  Diagnosis Date  . Anemia   . Cancer ([place])    bladder  . Cataracts, bilateral   . Diabetes mellitus without complication ([place])    takes Janumet daily  . Dyspnea   . Heart murmur   . History of colon polyps    benign  . Hyperlipidemia    takes Lovastatin daily  . Hypertension    takes Hyzaar and Amlodipine daily  . Joint pain   . Muscle cramps    occasionally in legs   . Osteoarthritis   . Urinary urgency     Past Surgical History:  Procedure Laterality Date  . BLADDER SURGERY    . COLONOSCOPY    . JOINT REPLACEMENT Left    hip  . KNEE ARTHROPLASTY Right 01/12/2016   Procedure: RIGHT  TOTAL KNEE ARTHROPLASTY WITH COMPUTER NAVIGATION;  Surgeon: Rod Can, MD;  Location: [place];  Service: Orthopedics;  Laterality: Right;  Needs RNFA  . SKIN SURGERY Left    arm    There were no vitals filed for this visit.      Subjective Assessment - 02/09/16 0835    Subjective Patient is having 5/10 pain yesterday and today it is hurting 2/10. Patient says that he is having trouble getting his breath and he had been running to the bathroom after he eats with diarrhea.    Patient is accompained by: Family member   Pertinent History Patient had pain for 3 year and then had TKR Jan 12, 2016 and had HHPT at home for 2 weeks   Limitations Sitting;Standing;Walking   How long can you sit comfortably? 30 mins   How long can you stand comfortably? 10  mins   How long can you walk comfortably? 10 mins   Pain Score 2    Pain Location Knee   Pain Orientation Right   Pain Descriptors / Indicators Aching   Pain Type Chronic pain   Pain  Onset 1 to 4 weeks ago       Therapeutic exercise: LAQ x 20  SAQ,x 20  heel slides x 20 SLR x 20  knee flex seated over pressure,x 5 with 5 sec hold hamstring stretch 30 x 3 sets, gastroc and soleus stretch B ankles, step ups 6 inch stool x 20,  Prone knee flex with towell roll under right thigh and 5 sec hold x 5 eccentric step down from 4 inch stool.x 5 Patient needs occasional verbal cueing to improve posture and cueing to correctly perform exercises slowly, holding at end of range to increase motor firing of desired muscle to encourage fatigue. ROM is -2 deg to 100 deg                           PT Education - 02/09/16 0837    Education provided Yes   Education Details ice   Person(s) Educated Patient   Methods Explanation   Comprehension Verbalized understanding             PT Long Term Goals - 01/28/16 1152      PT LONG TERM GOAL #1   Title Patient will report a worst  pain of 3/10 on VAS in right knee            to improve tolerance with ADLs and reduced symptoms with activities.    Time 8   Period Weeks   Status New     PT LONG TERM GOAL #2   Title Patient will be independent in home exercise program to improve strength/mobility for better functional independence with ADLs.   Time 8   Period Weeks   Status New     PT LONG TERM GOAL #3   Title Patient will increase BLE gross strength to 4+/5 as to improve functional strength for independent gait, increased standing tolerance and increased ADL ability.   Time 8   Period Weeks   Status New     PT LONG TERM GOAL #4   Title Patient (> 32 years old) will complete five times sit to stand test in < 15 seconds indicating an increased LE strength and improved balance.   Time 8   Period Weeks   Status New     PT LONG TERM GOAL #5   Title Patient will increase six minute walk test distance to >1000 for progression to community ambulator and improve gait ability   Time 8   Period Weeks    Status New               Plan - 02/09/16 [id number]    Clinical Impression Statement Patient reports that he is having shortness of breath 3 or 4 times during the day that is new beginning last thrusday. Patient was told to call his MD today about this issue. He is able to perform all his exercises without increased pain .    Clinical Impairments Affecting Rehab Potential diabetes, bladder CA, high blood pressure, Letf hip THR 2011   PT Frequency 2x / week   PT Duration 8 weeks   PT Treatment/Interventions Cryotherapy;Electrical Stimulation;Moist Heat;Therapeutic activities;Gait training;Therapeutic exercise;Patient/family education;Passive range of motion   PT Next Visit Plan ther ex   PT Home Exercise Plan knee flex seated   Consulted and Agree with Plan of Care Patient;Family member/caregiver      Patient will benefit from skilled therapeutic intervention in order to improve the following deficits and impairments:  Abnormal gait, Decreased mobility, Difficulty walking, Decreased range of motion, Pain, Decreased strength, Decreased activity tolerance  Visit Diagnosis: Difficulty in walking, not elsewhere classified  Muscle weakness (generalized)     Problem List Patient Active Problem List   Diagnosis Date Noted  . Status post right knee replacement 02/06/2016  . Osteoarthritis of right knee 01/12/2016  . Aortic valve stenosis, moderate 05/30/2015  . Bladder cancer ([place]) 11/15/2014  . Hyperlipidemia 11/15/2014  . ED (erectile dysfunction) 11/15/2014  . Osteoarthritis 11/15/2014  . Hypertension 11/15/2014  . Heart murmur 11/15/2014  . 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 Anemia 11/15/2014   Alanson Puls, PT, DPT [place], [place] S 02/09/2016, 8:39 AM  [place] MAIN [facility] SERVICES [address] [place], [place], [address] Phone: [phone]   Fax:  [phone]  Name: John [name] MRN: [id number] Date of Birth: [birth year]

## 2016-02-11 ENCOUNTER — Ambulatory Visit: Payer: PPO

## 2016-02-11 DIAGNOSIS — R262 Difficulty in walking, not elsewhere classified: Secondary | ICD-10-CM

## 2016-02-11 DIAGNOSIS — M6281 Muscle weakness (generalized): Secondary | ICD-10-CM

## 2016-02-11 NOTE — Therapy (Signed)
[place] MAIN [facility] SERVICES [address] [place], [place], [address] Phone: [phone]   Fax:  [phone]  Physical Therapy Treatment  Patient Details  Name: John [name] MRN: [id number] Date of Birth: 1941-01-09 Referring Provider: Rod Can   Encounter Date: 02/11/2016      PT End of Session - 02/11/16 0843    Visit Number 4   Number of Visits 17   Date for PT Re-Evaluation March 27, 2016   Authorization Type g codes 4   Authorization Time Period 3   PT Start Time 0800   PT Stop Time 0830   PT Time Calculation (min) 30 min   Equipment Utilized During Treatment Gait belt   Activity Tolerance Patient tolerated treatment well;Patient limited by fatigue;Patient limited by pain   Behavior During Therapy [facility] for tasks assessed/performed      Past Medical History:  Diagnosis Date  . Anemia   . Cancer ([place])    bladder  . Cataracts, bilateral   . Diabetes mellitus without complication ([place])    takes Janumet daily  . Dyspnea   . Heart murmur   . History of colon polyps    benign  . Hyperlipidemia    takes Lovastatin daily  . Hypertension    takes Hyzaar and Amlodipine daily  . Joint pain   . Muscle cramps    occasionally in legs   . Osteoarthritis   . Urinary urgency     Past Surgical History:  Procedure Laterality Date  . BLADDER SURGERY    . COLONOSCOPY    . JOINT REPLACEMENT Left    hip  . KNEE ARTHROPLASTY Right 01/12/2016   Procedure: RIGHT  TOTAL KNEE ARTHROPLASTY WITH COMPUTER NAVIGATION;  Surgeon: Rod Can, MD;  Location: [place];  Service: Orthopedics;  Laterality: Right;  Needs RNFA  . SKIN SURGERY Left    arm    There were no vitals filed for this visit.      Subjective Assessment - 02/11/16 0808    Subjective Patient reports no pain in the knee today but states his knee feels tight along the anterior aspect; along the joint line.   Patient is accompained by: Family member   Pertinent History  Patient had pain for 3 year and then had TKR Jan 12, 2016 and had HHPT at home for 2 weeks   Limitations Sitting;Standing;Walking   How long can you sit comfortably? 30 mins   How long can you stand comfortably? 10  mins   How long can you walk comfortably? 10 mins   Currently in Pain? No/denies   Pain Onset 1 to 4 weeks ago      Observation:  Knee flexion: 105 deg  Knee extension: 0  Therapeutic exercise: Nustep-5 min at beginning of therapy level 2 seat; position 11 LAQ -- x 20 Hamstring curls against YTB - x20  SLR --  x 20 Quad sets in supine - x 10 with tactile cueing to perform Step ups B -- 6 inch stool x 20, Contract - relax knee flexion - 20 second holds; 5 sec contractions x 5 Pre gait widened tandem stance on airex pads ant/post weight shifts -x20 B Eccentric heel tap step downs from 4 inch stool - x10 B Single leg stance - 30sec x 2  Patient needs occasional verbal cueing to improve posture and cueing to correctly perform exercises slowly, holding at end of range to increase motor firing of desired muscle to encourage fatigue.  PT Education - 02/11/16 [phone]    Education provided Yes   Education Details form/technique throughout exercise    Person(s) Educated Patient   Methods Explanation;Demonstration   Comprehension Verbalized understanding;Returned demonstration             PT Long Term Goals - 01/28/16 1152      PT LONG TERM GOAL #1   Title Patient will report a worst pain of 3/10 on VAS in right knee            to improve tolerance with ADLs and reduced symptoms with activities.    Time 8   Period Weeks   Status New     PT LONG TERM GOAL #2   Title Patient will be independent in home exercise program to improve strength/mobility for better functional independence with ADLs.   Time 8   Period Weeks   Status New     PT LONG TERM GOAL #3   Title Patient will increase BLE gross strength to 4+/5 as to improve functional strength for  independent gait, increased standing tolerance and increased ADL ability.   Time 8   Period Weeks   Status New     PT LONG TERM GOAL #4   Title Patient (> 102 years old) will complete five times sit to stand test in < 15 seconds indicating an increased LE strength and improved balance.   Time 8   Period Weeks   Status New     PT LONG TERM GOAL #5   Title Patient will increase six minute walk test distance to >1000 for progression to community ambulator and improve gait ability   Time 8   Period Weeks   Status New               Plan - 02/11/16 0844    Clinical Impression Statement Patient demonstrates fair quadriceps control and requires only minimal cueing to activate. Patient demonstrates decreased muscular strength in the L LE and wil benefit from further skilled therapy focused on improving motor control and muscular strength/endurance to return to prior level of function.    Clinical Impairments Affecting Rehab Potential diabetes, bladder CA, high blood pressure, Letf hip THR 2011   PT Frequency 2x / week   PT Duration 8 weeks   PT Treatment/Interventions Cryotherapy;Electrical Stimulation;Moist Heat;Therapeutic activities;Gait training;Therapeutic exercise;Patient/family education;Passive range of motion   PT Next Visit Plan ther ex   PT Home Exercise Plan knee flex seated   Consulted and Agree with Plan of Care Patient;Family member/caregiver      Patient will benefit from skilled therapeutic intervention in order to improve the following deficits and impairments:  Abnormal gait, Decreased mobility, Difficulty walking, Decreased range of motion, Pain, Decreased strength, Decreased activity tolerance  Visit Diagnosis: Difficulty in walking, not elsewhere classified  Muscle weakness (generalized)     Problem List Patient Active Problem List   Diagnosis Date Noted  . Status post right knee replacement 02/06/2016  . Osteoarthritis of right knee 01/12/2016  .  Aortic valve stenosis, moderate 05/30/2015  . Bladder cancer ([place]) 11/15/2014  . Hyperlipidemia 11/15/2014  . ED (erectile dysfunction) 11/15/2014  . Osteoarthritis 11/15/2014  . Hypertension 11/15/2014  . Heart murmur 11/15/2014  . 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 Anemia 11/15/2014    Blythe Stanford, PT DPT 02/11/2016, 8:59 AM  [place] MAIN [facility] SERVICES [address] [place], [place], [address] Phone: [phone]  Fax:  [phone]  Name: Nghia Masser MRN: [id number] Date of Birth: 10/11/1940

## 2016-02-17 ENCOUNTER — Ambulatory Visit: Payer: PPO

## 2016-02-17 VITALS — BP 153/97 | HR 96

## 2016-02-17 DIAGNOSIS — R262 Difficulty in walking, not elsewhere classified: Secondary | ICD-10-CM

## 2016-02-17 DIAGNOSIS — M6281 Muscle weakness (generalized): Secondary | ICD-10-CM

## 2016-02-17 NOTE — Therapy (Signed)
[place] MAIN [facility] SERVICES [address] [place], [place], [address] Phone: [phone]   Fax:  [phone]  Physical Therapy Treatment  Patient Details  Name: John [name] MRN: [id number] Date of Birth: 1940-11-07 Referring Provider: Rod Can   Encounter Date: 02/17/2016      PT End of Session - 02/17/16 0808    Visit Number 5   Number of Visits 17   Date for PT Re-Evaluation 04/05/16   Authorization Type g codes 5   Authorization Time Period 3   PT Start Time 0805   PT Stop Time 0845   PT Time Calculation (min) 40 min   Equipment Utilized During Treatment Gait belt   Activity Tolerance Patient tolerated treatment well;Patient limited by fatigue;Patient limited by pain   Behavior During Therapy [facility] for tasks assessed/performed      Past Medical History:  Diagnosis Date  . Anemia   . Cancer ([place])    bladder  . Cataracts, bilateral   . Diabetes mellitus without complication ([place])    takes Janumet daily  . Dyspnea   . Heart murmur   . History of colon polyps    benign  . Hyperlipidemia    takes Lovastatin daily  . Hypertension    takes Hyzaar and Amlodipine daily  . Joint pain   . Muscle cramps    occasionally in legs   . Osteoarthritis   . Urinary urgency     Past Surgical History:  Procedure Laterality Date  . BLADDER SURGERY    . COLONOSCOPY    . JOINT REPLACEMENT Left    hip  . KNEE ARTHROPLASTY Right 01/12/2016   Procedure: RIGHT  TOTAL KNEE ARTHROPLASTY WITH COMPUTER NAVIGATION;  Surgeon: Rod Can, MD;  Location: [place];  Service: Orthopedics;  Laterality: Right;  Needs RNFA  . SKIN SURGERY Left    arm    Vitals:   02/17/16 0809  BP: (!) 153/97  Pulse: 96  SpO2: 99%        Subjective Assessment - 02/17/16 0807    Subjective Pt reports that he is doing well today. Reports some increased stiffness in his R knee. Denies pain on this date. States that he has had some increased SOB over  the last 2 weeks but has seen his MD concerning this.    Patient is accompained by: Family member   Pertinent History Patient had pain for 3 year and then had TKR Jan 12, 2016 and had HHPT at home for 2 weeks   Limitations Sitting;Standing;Walking   How long can you sit comfortably? 30 mins   How long can you stand comfortably? 10  mins   How long can you walk comfortably? 10 mins   Currently in Pain? No/denies      TREATMENT  Therapeutic exercise Recumbent bike progressively moving the seat forward and performing rocking moving into full revolutions to improve R knee ROM x 5 minutes; Quantum R single leg press 75# x 10, 90# x 10 (moving seat closer to foot plate);  Manual Therapy Seated R knee MET flexion stretches 5s contract, 5s stretch, progressively increasing R knee flexion; Hooklying R knee tibia on femur AP mobilizations grade III, 30s/bout x 3 bouts to improve flexion; STM to R vastus lateralis to improve flexion ROM, pt with trigger points and soft tissue restriction in quads, increased R knee swelling and warmth noted; R knee extension stretch with active quad set and overpressure 30 second hold x 5; R knee  femur on tibia AP mobilizations at end range extension to improve extension grade III, 30s/bout x 3 bouts; R knee extension measurement: -5 degrees, pt reports increased soreness at end of session so no flexion measure obtained on this date.  Cold pack applied to R knee at end of session during HEP review;  Patient needs occasional verbal cueing to improve posture and cueing to correctly perform exercises slowly, holding at end of range to increase motor firing of desired muscle to encourage fatigue.                         PT Education - 02/17/16 0808    Education provided Yes   Education Details HEP reinforced   Person(s) Educated Patient   Methods Explanation   Comprehension Verbalized understanding             PT Long Term Goals -  01/28/16 1152      PT LONG TERM GOAL #1   Title Patient will report a worst pain of 3/10 on VAS in right knee            to improve tolerance with ADLs and reduced symptoms with activities.    Time 8   Period Weeks   Status New     PT LONG TERM GOAL #2   Title Patient will be independent in home exercise program to improve strength/mobility for better functional independence with ADLs.   Time 8   Period Weeks   Status New     PT LONG TERM GOAL #3   Title Patient will increase BLE gross strength to 4+/5 as to improve functional strength for independent gait, increased standing tolerance and increased ADL ability.   Time 8   Period Weeks   Status New     PT LONG TERM GOAL #4   Title Patient (> 56 years old) will complete five times sit to stand test in < 15 seconds indicating an increased LE strength and improved balance.   Time 8   Period Weeks   Status New     PT LONG TERM GOAL #5   Title Patient will increase six minute walk test distance to >1000 for progression to community ambulator and improve gait ability   Time 8   Period Weeks   Status New               Plan - 02/17/16 0809    Clinical Impression Statement Pt demonstrates gradually improving R knee flexion and extension with stretching and manual therapy. Pt with some increased warmth and swelling in R knee noted on this date. Encouraged pt to continue with elevation and ice. Pt encouraged to continue HEP and follow-up as scheduled.    Clinical Impairments Affecting Rehab Potential diabetes, bladder CA, high blood pressure, Letf hip THR 2011   PT Frequency 2x / week   PT Duration 8 weeks   PT Treatment/Interventions Cryotherapy;Electrical Stimulation;Moist Heat;Therapeutic activities;Gait training;Therapeutic exercise;Patient/family education;Passive range of motion   PT Next Visit Plan ther ex and manual therapy   PT Home Exercise Plan knee flex seated   Consulted and Agree with Plan of Care Patient;Family  member/caregiver      Patient will benefit from skilled therapeutic intervention in order to improve the following deficits and impairments:  Abnormal gait, Decreased mobility, Difficulty walking, Decreased range of motion, Pain, Decreased strength, Decreased activity tolerance  Visit Diagnosis: Difficulty in walking, not elsewhere classified  Muscle weakness (generalized)  Problem List Patient Active Problem List   Diagnosis Date Noted  . Status post right knee replacement 02/06/2016  . Osteoarthritis of right knee 01/12/2016  . Aortic valve stenosis, moderate 05/30/2015  . Bladder cancer ([place]) 11/15/2014  . Hyperlipidemia 11/15/2014  . ED (erectile dysfunction) 11/15/2014  . Osteoarthritis 11/15/2014  . Hypertension 11/15/2014  . Heart murmur 11/15/2014  . 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 Anemia 11/15/2014   Phillips Grout PT, DPT   Huprich,Jason 02/17/2016, 8:47 AM  [place] MAIN [facility] SERVICES [address] [place], [place], [address] Phone: [phone]   Fax:  [phone]  Name: Kelven Flater MRN: [id number] Date of Birth: 29-Jun-1940

## 2016-02-19 ENCOUNTER — Encounter: Payer: Self-pay | Admitting: Physical Therapy

## 2016-02-19 ENCOUNTER — Ambulatory Visit: Payer: PPO | Admitting: Physical Therapy

## 2016-02-19 DIAGNOSIS — R262 Difficulty in walking, not elsewhere classified: Secondary | ICD-10-CM

## 2016-02-19 DIAGNOSIS — M6281 Muscle weakness (generalized): Secondary | ICD-10-CM

## 2016-02-19 NOTE — Therapy (Signed)
[place] MAIN [facility] SERVICES [address] [place], [place], [address] Phone: [phone]   Fax:  [phone]  Physical Therapy Treatment/Progress Note  Patient Details  Name: John [name] MRN: [id number] Date of Birth: 1940-05-31 Referring Provider: Rod Can   Encounter Date: 02/19/2016      PT End of Session - 02/19/16 0930    Visit Number 6   Number of Visits 17   Date for PT Re-Evaluation 08-Apr-2016   Authorization Type g codes 6   Authorization Time Period 3   PT Start Time 0920   PT Stop Time 1000   PT Time Calculation (min) 40 min   Equipment Utilized During Treatment Gait belt   Activity Tolerance Patient tolerated treatment well;Patient limited by fatigue;Patient limited by pain   Behavior During Therapy [facility] for tasks assessed/performed      Past Medical History:  Diagnosis Date  . Anemia   . Cancer ([place])    bladder  . Cataracts, bilateral   . Diabetes mellitus without complication ([place])    takes Janumet daily  . Dyspnea   . Heart murmur   . History of colon polyps    benign  . Hyperlipidemia    takes Lovastatin daily  . Hypertension    takes Hyzaar and Amlodipine daily  . Joint pain   . Muscle cramps    occasionally in legs   . Osteoarthritis   . Urinary urgency     Past Surgical History:  Procedure Laterality Date  . BLADDER SURGERY    . COLONOSCOPY    . JOINT REPLACEMENT Left    hip  . KNEE ARTHROPLASTY Right 01/12/2016   Procedure: RIGHT  TOTAL KNEE ARTHROPLASTY WITH COMPUTER NAVIGATION;  Surgeon: Rod Can, MD;  Location: [facility];  Service: Orthopedics;  Laterality: Right;  Needs RNFA  . SKIN SURGERY Left    arm    There were no vitals filed for this visit.      Subjective Assessment - 02/19/16 0926    Subjective Pt reports that he is doing well today. Reports some increased stiffness in his R knee. Denies pain on this date. States that he has had some increased SOB over the last 2  weeks but has seen his MD concerning this.    Patient is accompained by: Family member   Pertinent History Patient had pain for 3 year and then had TKR Jan 12, 2016 and had HHPT at home for 2 weeks   Limitations Sitting;Standing;Walking   How long can you sit comfortably? 30 mins   How long can you stand comfortably? 10  mins   How long can you walk comfortably? 10 mins   Currently in Pain? Yes   Pain Score 2    Pain Location Knee   Pain Orientation Right   Pain Type Chronic pain   Pain Onset 1 to 4 weeks ago   Multiple Pain Sites No       Therapeutic exercise: LAQ, SAQ, heel slides, SLR, Prone knee flex with towel under thigh and 5 stretches with 10 sec hold sidelying abd x 20 sidelying hip extension x 20 knee flex seated,  hamstring stretch 30 x 3 sets,  gastroc and soleus stretch B ankles, Leg press 75 lbs single leg x 20 x 3 step ups 6 inch stool x 20, eccentric step down from 4 inch stool. Patient needs occasional verbal cueing to improve posture and cueing to correctly perform exercises slowly, holding at end of range  to increase motor firing of desired muscle to encourage fatigue.R knee  ROM is -2deg to 107 deg                           PT Education - 02/19/16 0927    Education Details HEP   Person(s) Educated Patient   Methods Explanation   Comprehension Verbalized understanding             PT Long Term Goals - 01/28/16 1152      PT LONG TERM GOAL #1   Title Patient will report a worst pain of 3/10 on VAS in right knee            to improve tolerance with ADLs and reduced symptoms with activities.    Time 8   Period Weeks   Status New     PT LONG TERM GOAL #2   Title Patient will be independent in home exercise program to improve strength/mobility for better functional independence with ADLs.   Time 8   Period Weeks   Status New     PT LONG TERM GOAL #3   Title Patient will increase BLE gross strength to 4+/5 as to improve  functional strength for independent gait, increased standing tolerance and increased ADL ability.   Time 8   Period Weeks   Status New     PT LONG TERM GOAL #4   Title Patient (> 50 years old) will complete five times sit to stand test in < 15 seconds indicating an increased LE strength and improved balance.   Time 8   Period Weeks   Status New     PT LONG TERM GOAL #5   Title Patient will increase six minute walk test distance to >1000 for progression to community ambulator and improve gait ability   Time 8   Period Weeks   Status New               Plan - 02/19/16 0932    Clinical Impression Statement Patient continues to have decreased ROM and strength in R knee that is improving each week with exercises. Patient was educated about knee extension exercises for HEP.    Rehab Potential Good   PT Frequency 2x / week   PT Duration 8 weeks   PT Treatment/Interventions Cryotherapy;Electrical Stimulation;Moist Heat;Therapeutic activities;Gait training;Therapeutic exercise;Patient/family education;Passive range of motion   PT Next Visit Plan ther ex and manual therapy   PT Home Exercise Plan knee flex seated   Consulted and Agree with Plan of Care Patient;Family member/caregiver      Patient will benefit from skilled therapeutic intervention in order to improve the following deficits and impairments:  Abnormal gait, Decreased mobility, Difficulty walking, Decreased range of motion, Pain, Decreased strength, Decreased activity tolerance  Visit Diagnosis: Difficulty in walking, not elsewhere classified  Muscle weakness (generalized)     Problem List Patient Active Problem List   Diagnosis Date Noted  . Status post right knee replacement 02/06/2016  . Osteoarthritis of right knee 01/12/2016  . Aortic valve stenosis, moderate 05/30/2015  . Bladder cancer ([place]) 11/15/2014  . Hyperlipidemia 11/15/2014  . ED (erectile dysfunction) 11/15/2014  . Osteoarthritis 11/15/2014   . Hypertension 11/15/2014  . Heart murmur 11/15/2014  . 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 Anemia 11/15/2014    Arelia Sneddon S 02/19/2016, 9:35 AM  [place] [facility] [address]  [place], [place], [address] Phone: [phone]   Fax:  [phone]  Name: Tuck Tin MRN: [id number] Date of Birth: 11/20/1940

## 2016-02-24 DIAGNOSIS — Z96651 Presence of right artificial knee joint: Secondary | ICD-10-CM | POA: Diagnosis not present

## 2016-02-25 ENCOUNTER — Encounter: Payer: Self-pay | Admitting: Physical Therapy

## 2016-02-25 ENCOUNTER — Ambulatory Visit: Payer: PPO

## 2016-02-25 DIAGNOSIS — R262 Difficulty in walking, not elsewhere classified: Secondary | ICD-10-CM

## 2016-02-25 DIAGNOSIS — M6281 Muscle weakness (generalized): Secondary | ICD-10-CM

## 2016-02-25 NOTE — Therapy (Signed)
[place] MAIN [facility] SERVICES [address] [place], [place], [address] Phone: [phone]   Fax:  [phone]  Physical Therapy Treatment  Patient Details  Name: John [name] MRN: [id number] Date of Birth: 26-Feb-1941 Referring Provider: Rod Can   Encounter Date: 02/25/2016      PT End of Session - 02/25/16 0806    Visit Number 7   Number of Visits 17   Date for PT Re-Evaluation 04-05-2016   Authorization Type g codes 7   Authorization Time Period 3   PT Start Time 0800   PT Stop Time 0850   PT Time Calculation (min) 50 min   Equipment Utilized During Treatment Gait belt   Activity Tolerance Patient tolerated treatment well;Patient limited by fatigue;Patient limited by pain   Behavior During Therapy [facility] for tasks assessed/performed      Past Medical History:  Diagnosis Date  . Anemia   . Cancer ([place])    bladder  . Cataracts, bilateral   . Diabetes mellitus without complication ([place])    takes Janumet daily  . Dyspnea   . Heart murmur   . History of colon polyps    benign  . Hyperlipidemia    takes Lovastatin daily  . Hypertension    takes Hyzaar and Amlodipine daily  . Joint pain   . Muscle cramps    occasionally in legs   . Osteoarthritis   . Urinary urgency     Past Surgical History:  Procedure Laterality Date  . BLADDER SURGERY    . COLONOSCOPY    . JOINT REPLACEMENT Left    hip  . KNEE ARTHROPLASTY Right 01/12/2016   Procedure: RIGHT  TOTAL KNEE ARTHROPLASTY WITH COMPUTER NAVIGATION;  Surgeon: Rod Can, MD;  Location: [facility];  Service: Orthopedics;  Laterality: Right;  Needs RNFA  . SKIN SURGERY Left    arm    There were no vitals filed for this visit.      Subjective Assessment - 02/25/16 0803    Subjective Pt states that he went to the orthopedist yesterday who was pleased with his progress. Orthopedist would like for him to continue working on improving flexion and he is pleased with his  extension. Pt reports continued cough and he was encouraged by orthopedist to see his PCP regarding symptoms. Denies right knee pain at this time.    Patient is accompained by: Family member   Pertinent History Patient had pain for 3 year and then had TKR Jan 12, 2016 and had HHPT at home for 2 weeks   Limitations Sitting;Standing;Walking   How long can you sit comfortably? 30 mins   How long can you stand comfortably? 10  mins   How long can you walk comfortably? 10 mins   Currently in Pain? No/denies      TREATMENT  Therapeutic exercise NuStep L2 x 5 minutes gradually moving seat forward to increase R knee flexion AAROM, cues provided to avoid compensatory techniques; Recumbent bike progressively moving the seat forward and performing rocking moving into full revolutions to improve R knee ROM, forward/backward revolutions x 3 minutes, cues to avoid compensatory strategies;   Extensive HEP review to discuss R knee flexion stretches, pt educated about step stretches, seated stretches, and hooklying stretches with belt around ankle; LAQ with manual resistance 2 x 10; Quantum R single leg press 90# 2 x 10, seat close to foot plate to challenge flexion AAROM; BOSU forward lunges always leading with RLE without UE support 2  x 10;  Manual Therapy STM to R vastus lateralis to improve flexion ROM, pt with trigger points and soft tissue restriction in quads, increased R knee swelling and warmth noted; Seated R knee MET flexion stretches 5s contract, 5s stretch, progressively increasing R knee flexion; Hooklying R knee tibia on femur AP mobilizations grade III, 30s/bout x 3 bouts to improve flexion; R knee flexion measurement AAROM with OP: 109 degrees, pt reports pain at end range flexion Cold pack applied to R knee at end of session (unbilled);  Patient needs occasional verbal cueing to improve posture and cueing to correctly perform exercises slowly, holding at end of range to increase motor  firing of desired muscle to encourage fatigue.                         PT Education - 02/25/16 0806    Education provided Yes   Education Details HEP reinforced, especially flexion stretches   Person(s) Educated Patient   Methods Explanation   Comprehension Verbalized understanding             PT Long Term Goals - 01/28/16 1152      PT LONG TERM GOAL #1   Title Patient will report a worst pain of 3/10 on VAS in right knee            to improve tolerance with ADLs and reduced symptoms with activities.    Time 8   Period Weeks   Status New     PT LONG TERM GOAL #2   Title Patient will be independent in home exercise program to improve strength/mobility for better functional independence with ADLs.   Time 8   Period Weeks   Status New     PT LONG TERM GOAL #3   Title Patient will increase BLE gross strength to 4+/5 as to improve functional strength for independent gait, increased standing tolerance and increased ADL ability.   Time 8   Period Weeks   Status New     PT LONG TERM GOAL #4   Title Patient (> 68 years old) will complete five times sit to stand test in < 15 seconds indicating an increased LE strength and improved balance.   Time 8   Period Weeks   Status New     PT LONG TERM GOAL #5   Title Patient will increase six minute walk test distance to >1000 for progression to community ambulator and improve gait ability   Time 8   Period Weeks   Status New               Plan - 02/25/16 0807    Clinical Impression Statement Pt continues to demonstrate improving R knee flexion AAROM with overpressure. He reports continued decrease in R knee pain and improving function. Pt provided extensive education today about R knee flexion stretches he can perform at home. Pt encouraged to continue HEP and follow-up as scheduled.    Rehab Potential Good   Clinical Impairments Affecting Rehab Potential diabetes, bladder CA, high blood pressure, Letf  hip THR 2011   PT Frequency 2x / week   PT Duration 8 weeks   PT Treatment/Interventions Cryotherapy;Electrical Stimulation;Moist Heat;Therapeutic activities;Gait training;Therapeutic exercise;Patient/family education;Passive range of motion   PT Next Visit Plan ther ex and manual therapy   PT Home Exercise Plan As prescribed   Consulted and Agree with Plan of Care Patient      Patient will benefit from skilled therapeutic  intervention in order to improve the following deficits and impairments:  Abnormal gait, Decreased mobility, Difficulty walking, Decreased range of motion, Pain, Decreased strength, Decreased activity tolerance  Visit Diagnosis: Difficulty in walking, not elsewhere classified  Muscle weakness (generalized)     Problem List Patient Active Problem List   Diagnosis Date Noted  . Status post right knee replacement 02/06/2016  . Osteoarthritis of right knee 01/12/2016  . Aortic valve stenosis, moderate 05/30/2015  . Bladder cancer ([place]) 11/15/2014  . Hyperlipidemia 11/15/2014  . ED (erectile dysfunction) 11/15/2014  . Osteoarthritis 11/15/2014  . Hypertension 11/15/2014  . Heart murmur 11/15/2014  . 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 Anemia 11/15/2014   Phillips Grout PT, DPT   Samani Deal 02/25/2016, 8:50 AM  [place] MAIN [facility] SERVICES [address] [place], [place], [address] Phone: [phone]   Fax:  [phone]  Name: John [name] MRN: [id number] Date of Birth: [birth year]

## 2016-03-02 ENCOUNTER — Ambulatory Visit: Payer: PPO | Attending: Orthopedic Surgery | Admitting: Physical Therapy

## 2016-03-02 ENCOUNTER — Encounter: Payer: Self-pay | Admitting: Physical Therapy

## 2016-03-02 DIAGNOSIS — R262 Difficulty in walking, not elsewhere classified: Secondary | ICD-10-CM | POA: Diagnosis not present

## 2016-03-02 DIAGNOSIS — M6281 Muscle weakness (generalized): Secondary | ICD-10-CM | POA: Diagnosis not present

## 2016-03-02 NOTE — Therapy (Addendum)
[place] MAIN [facility] SERVICES [address] [place], [place], [address] Phone: [phone]   Fax:  [phone]  Physical Therapy Treatment  Patient Details  Name: John [name] MRN: [id number] Date of Birth: [birth year] Referring Provider: Rod Can   Encounter Date: 03/02/2016      PT End of Session - 03/02/16 0916    Visit Number 8   Number of Visits 17   Date for PT Re-Evaluation 04-14-16   Authorization Type g codes 8   Authorization Time Period 3   PT Start Time 0915   PT Stop Time 1000   PT Time Calculation (min) 45 min   Equipment Utilized During Treatment Gait belt   Activity Tolerance Patient tolerated treatment well;Patient limited by fatigue   Behavior During Therapy [facility] for tasks assessed/performed      Past Medical History:  Diagnosis Date  . Anemia   . Cancer ([place])    bladder  . Cataracts, bilateral   . Diabetes mellitus without complication ([place])    takes Janumet daily  . Dyspnea   . Heart murmur   . History of colon polyps    benign  . Hyperlipidemia    takes Lovastatin daily  . Hypertension    takes Hyzaar and Amlodipine daily  . Joint pain   . Muscle cramps    occasionally in legs   . Osteoarthritis   . Urinary urgency     Past Surgical History:  Procedure Laterality Date  . BLADDER SURGERY    . COLONOSCOPY    . JOINT REPLACEMENT Left    hip  . KNEE ARTHROPLASTY Right 01/12/2016   Procedure: RIGHT  TOTAL KNEE ARTHROPLASTY WITH COMPUTER NAVIGATION;  Surgeon: Rod Can, MD;  Location: [facility];  Service: Orthopedics;  Laterality: Right;  Needs RNFA  . SKIN SURGERY Left    arm    There were no vitals filed for this visit.      Subjective Assessment - 03/02/16 [id number]    Subjective Patient reports that he was in a lot of pain after his last visit and was not able to ambulate without AD until Sunday . He is having 2/10 pain today. He is doing his HEP and says that his appointment with MD went  well.    Patient is accompained by: Family member   Pertinent History Patient had pain for 3 year and then had TKR Jan 12, 2016 and had HHPT at home for 2 weeks   Limitations Sitting;Standing;Walking   How long can you sit comfortably? 30 mins   How long can you stand comfortably? 10  mins   How long can you walk comfortably? 10 mins   Currently in Pain? Yes   Pain Score 2    Pain Location Knee   Pain Orientation Right   Pain Descriptors / Indicators Aching   Pain Onset 1 to 4 weeks ago   Pain Frequency Occasional   Multiple Pain Sites No      Therapeutic exercise: Nu-step x 7 minutes SAQ with 3 lbs,x 20 x 2, hold for 3 seconds SLR x 20 knee flex seated over pressure,x 5 with 5 sec hold hamstring stretch 30 x 3 sets,  step ups 6 inch stool x 20,  Standing heel raises x 20 single leg Prone knee flex with towell roll under right thigh and 5 sec hold x 10 eccentric step down from 4 inch stool.x 5 Patient needs occasional verbal cueing to improve posture and cueing to  correctly perform exercises slowly, holding at end of range to increase motor firing of desired muscle to encourage fatigue. ROM is -2 deg to 100 deg                            PT Education - 03/02/16 0941    Education provided Yes   Education Details HEP and use of heat and ice   Person(s) Educated Patient   Methods Explanation   Comprehension Verbalized understanding             PT Long Term Goals - 01/28/16 1152      PT LONG TERM GOAL #1   Title Patient will report a worst pain of 3/10 on VAS in right knee            to improve tolerance with ADLs and reduced symptoms with activities.    Time 8   Period Weeks   Status New     PT LONG TERM GOAL #2   Title Patient will be independent in home exercise program to improve strength/mobility for better functional independence with ADLs.   Time 8   Period Weeks   Status New     PT LONG TERM GOAL #3   Title Patient will increase  BLE gross strength to 4+/5 as to improve functional strength for independent gait, increased standing tolerance and increased ADL ability.   Time 8   Period Weeks   Status New     PT LONG TERM GOAL #4   Title Patient (> 50 years old) will complete five times sit to stand test in < 15 seconds indicating an increased LE strength and improved balance.   Time 8   Period Weeks   Status New     PT LONG TERM GOAL #5   Title Patient will increase six minute walk test distance to >1000 for progression to community ambulator and improve gait ability   Time 8   Period Weeks   Status New               Plan - 03/02/16 [id number]    Clinical Impression Statement Patient reports that he is doing his HEP. He is able to perform seated, prone and supine therapeutic exercises and his ROM is improving and is -2 to 105 deg in flex and ext.   Rehab Potential Good   Clinical Impairments Affecting Rehab Potential diabetes, bladder CA, high blood pressure, Letf hip THR 2011   PT Frequency 2x / week   PT Duration 8 weeks   PT Treatment/Interventions Cryotherapy;Electrical Stimulation;Moist Heat;Therapeutic activities;Gait training;Therapeutic exercise;Patient/family education;Passive range of motion   PT Next Visit Plan ther ex and manual therapy   PT Home Exercise Plan As prescribed   Consulted and Agree with Plan of Care Patient      Patient will benefit from skilled therapeutic intervention in order to improve the following deficits and impairments:  Abnormal gait, Decreased mobility, Difficulty walking, Decreased range of motion, Pain, Decreased strength, Decreased activity tolerance  Visit Diagnosis: Difficulty in walking, not elsewhere classified  Muscle weakness (generalized)     Problem List Patient Active Problem List   Diagnosis Date Noted  . Status post right knee replacement 02/06/2016  . Osteoarthritis of right knee 01/12/2016  . Aortic valve stenosis, moderate 05/30/2015  .  Bladder cancer ([place]) 11/15/2014  . Hyperlipidemia 11/15/2014  . ED (erectile dysfunction) 11/15/2014  . Osteoarthritis 11/15/2014  . Hypertension 11/15/2014  .  Heart murmur 11/15/2014  . 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 Anemia 11/15/2014   Alanson Puls, PT, DPT [place], [place] S 03/02/2016, 5:22 PM  [place] MAIN [facility] SERVICES [address] [place], [place], [address] Phone: [phone]   Fax:  [phone]  Name: John [name] MRN: [id number] Date of Birth: 1940-06-06

## 2016-03-04 ENCOUNTER — Ambulatory Visit: Payer: PPO | Admitting: Physical Therapy

## 2016-03-04 ENCOUNTER — Other Ambulatory Visit: Payer: Self-pay | Admitting: Family Medicine

## 2016-03-04 NOTE — Telephone Encounter (Signed)
Routing to provider. Appt 05/24/16.

## 2016-03-09 ENCOUNTER — Encounter: Payer: Self-pay | Admitting: Physical Therapy

## 2016-03-09 ENCOUNTER — Ambulatory Visit: Payer: PPO | Admitting: Physical Therapy

## 2016-03-09 DIAGNOSIS — R262 Difficulty in walking, not elsewhere classified: Secondary | ICD-10-CM | POA: Diagnosis not present

## 2016-03-09 DIAGNOSIS — M6281 Muscle weakness (generalized): Secondary | ICD-10-CM

## 2016-03-09 NOTE — Therapy (Signed)
[place] MAIN [facility] SERVICES [address] [place], [place], [address] Phone: [phone]   Fax:  [phone]  Physical Therapy Treatment  Patient Details  Name: John [name] MRN: [id number] Date of Birth: 03-11-1940 Referring Provider: Rod Can   Encounter Date: 03/09/2016      PT End of Session - 03/09/16 0912    Visit Number 9   Number of Visits 17   Date for PT Re-Evaluation 2016-04-02   Authorization Type g codes 9   Authorization Time Period 3   PT Start Time 0915   PT Stop Time 1000   PT Time Calculation (min) 45 min   Equipment Utilized During Treatment Gait belt   Activity Tolerance Patient tolerated treatment well;Patient limited by fatigue   Behavior During Therapy [facility] for tasks assessed/performed      Past Medical History:  Diagnosis Date  . Anemia   . Cancer ([place])    bladder  . Cataracts, bilateral   . Diabetes mellitus without complication ([place])    takes Janumet daily  . Dyspnea   . Heart murmur   . History of colon polyps    benign  . Hyperlipidemia    takes Lovastatin daily  . Hypertension    takes Hyzaar and Amlodipine daily  . Joint pain   . Muscle cramps    occasionally in legs   . Osteoarthritis   . Urinary urgency     Past Surgical History:  Procedure Laterality Date  . BLADDER SURGERY    . COLONOSCOPY    . JOINT REPLACEMENT Left    hip  . KNEE ARTHROPLASTY Right 01/12/2016   Procedure: RIGHT  TOTAL KNEE ARTHROPLASTY WITH COMPUTER NAVIGATION;  Surgeon: Rod Can, MD;  Location: [place];  Service: Orthopedics;  Laterality: Right;  Needs RNFA  . SKIN SURGERY Left    arm    There were no vitals filed for this visit.      Subjective Assessment - 03/09/16 0913    Subjective Patient reports he was not in much pain after the last visit and did well with exercises. He states he did not get out of the house for the past few days with the cold weather, so he has been resting.   Pertinent  History Patient had pain for 3 year and then had TKR Jan 12, 2016 and had HHPT at home for 2 weeks   Limitations Sitting;Standing;Walking   How long can you sit comfortably? 30 mins   How long can you stand comfortably? 10  mins   How long can you walk comfortably? 10 mins   Currently in Pain? No/denies   Pain Score 0-No pain   Pain Onset 1 to 4 weeks ago   Multiple Pain Sites No      Therapeutic exercise: NuStep 5 minutes Passive knee flexion in sitting x5 Passive knee flexion in prone x10  Knee extension on stairs BLE 30s x5 Knee flexion on stairs RLE 30s x5 Gastroc/soleus stretches on stairs x5; Patient required verbal and tactile cues, and a demonstration in order to perform correctly. Interval straight leg raises RLE x10 SAQs with 3 lb weight x30  Active assisted knee flexion on sliding board x50 Sidelying RLE straight leg hip abduction x15 Sidelying RLE bent leg hip abduction x15 Leg press 20x3 at 120 lbs   ROM: R knee flexion: 110 degrees Patient is able to perform all exercises with minimal instructions and needs assist to perform the stretches without bouncing and with  correct leg position. Patient reports no pain at the end of session.                        PT Education - 03/09/16 0934    Education provided Yes   Education Details Pt educated on HEP, especially stretches on stairs   Person(s) Educated Patient   Methods Explanation   Comprehension Verbalized understanding             PT Long Term Goals - 01/28/16 1152      PT LONG TERM GOAL #1   Title Patient will report a worst pain of 3/10 on VAS in right knee            to improve tolerance with ADLs and reduced symptoms with activities.    Time 8   Period Weeks   Status New     PT LONG TERM GOAL #2   Title Patient will be independent in home exercise program to improve strength/mobility for better functional independence with ADLs.   Time 8   Period Weeks   Status New     PT  LONG TERM GOAL #3   Title Patient will increase BLE gross strength to 4+/5 as to improve functional strength for independent gait, increased standing tolerance and increased ADL ability.   Time 8   Period Weeks   Status New     PT LONG TERM GOAL #4   Title Patient (> 11 years old) will complete five times sit to stand test in < 15 seconds indicating an increased LE strength and improved balance.   Time 8   Period Weeks   Status New     PT LONG TERM GOAL #5   Title Patient will increase six minute walk test distance to >1000 for progression to community ambulator and improve gait ability   Time 8   Period Weeks   Status New               Plan - 03/09/16 1000    Clinical Impression Statement Patient reports that he is doing his HEP and was able to demonstrate stair stretches. Patient's ROM has improved to 110 deg flexion.   Rehab Potential Good   Clinical Impairments Affecting Rehab Potential diabetes, bladder CA, high blood pressure, Letf hip THR 2011   PT Frequency 2x / week   PT Duration 8 weeks   PT Treatment/Interventions Cryotherapy;Electrical Stimulation;Moist Heat;Therapeutic activities;Gait training;Therapeutic exercise;Patient/family education;Passive range of motion   PT Next Visit Plan ther ex and manual therapy   PT Home Exercise Plan As prescribed   Consulted and Agree with Plan of Care Patient      Patient will benefit from skilled therapeutic intervention in order to improve the following deficits and impairments:  Abnormal gait, Decreased mobility, Difficulty walking, Decreased range of motion, Pain, Decreased strength, Decreased activity tolerance  Visit Diagnosis: Difficulty in walking, not elsewhere classified  Muscle weakness (generalized)     Problem List Patient Active Problem List   Diagnosis Date Noted  . Status post right knee replacement 02/06/2016  . Osteoarthritis of right knee 01/12/2016  . Aortic valve stenosis, moderate 05/30/2015   . Bladder cancer ([place]) 11/15/2014  . Hyperlipidemia 11/15/2014  . ED (erectile dysfunction) 11/15/2014  . Osteoarthritis 11/15/2014  . Hypertension 11/15/2014  . Heart murmur 11/15/2014  . 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 Anemia 11/15/2014  Alanson Puls, PT, DPT  Alanson Puls 03/09/2016, 11:56 AM  [place] MAIN [facility] SERVICES [address] [place], [place], [address] Phone: [phone]   Fax:  [phone]  Name: John [name] MRN: [id number] Date of Birth: 20-Feb-1941

## 2016-03-11 ENCOUNTER — Encounter: Payer: Self-pay | Admitting: Physical Therapy

## 2016-03-11 ENCOUNTER — Ambulatory Visit: Payer: PPO | Admitting: Physical Therapy

## 2016-03-11 DIAGNOSIS — R262 Difficulty in walking, not elsewhere classified: Secondary | ICD-10-CM | POA: Diagnosis not present

## 2016-03-11 DIAGNOSIS — M6281 Muscle weakness (generalized): Secondary | ICD-10-CM

## 2016-03-11 NOTE — Therapy (Signed)
[place] MAIN [facility] SERVICES [address] [place], [place], [address] Phone: [phone]   Fax:  [phone]  Physical Therapy Treatment / Discharge Summary  Patient Details  Name: John [name] MRN: [id number] Date of Birth: 06/05/1940 Referring Provider: Rod Can   Encounter Date: 03/11/2016      PT End of Session - 03/11/16 0938    Visit Number 10   Number of Visits 17   Date for PT Re-Evaluation 04-12-2016   Authorization Type g codes 10   Authorization Time Period 3   PT Start Time 0915   PT Stop Time 1000   PT Time Calculation (min) 45 min   Activity Tolerance Patient tolerated treatment well;Patient limited by fatigue   Behavior During Therapy [facility] for tasks assessed/performed      Past Medical History:  Diagnosis Date  . Anemia   . Cancer ([place])    bladder  . Cataracts, bilateral   . Diabetes mellitus without complication ([place])    takes Janumet daily  . Dyspnea   . Heart murmur   . History of colon polyps    benign  . Hyperlipidemia    takes Lovastatin daily  . Hypertension    takes Hyzaar and Amlodipine daily  . Joint pain   . Muscle cramps    occasionally in legs   . Osteoarthritis   . Urinary urgency     Past Surgical History:  Procedure Laterality Date  . BLADDER SURGERY    . COLONOSCOPY    . JOINT REPLACEMENT Left    hip  . KNEE ARTHROPLASTY Right 01/12/2016   Procedure: RIGHT  TOTAL KNEE ARTHROPLASTY WITH COMPUTER NAVIGATION;  Surgeon: Rod Can, MD;  Location: [place];  Service: Orthopedics;  Laterality: Right;  Needs RNFA  . SKIN SURGERY Left    arm    There were no vitals filed for this visit.      Subjective Assessment - 03/11/16 0938    Subjective Patient is doing well today and denies pain. Patient states he does have stiffness around the knee   Pertinent History Patient had pain for 3 year and then had TKR Jan 12, 2016 and had HHPT at home for 2 weeks   Limitations  Sitting;Standing;Walking   How long can you sit comfortably? 30 mins   How long can you stand comfortably? 10  mins   How long can you walk comfortably? 10 mins   Currently in Pain? No/denies   Pain Onset 1 to 4 weeks ago   Multiple Pain Sites No      Therapeutic exercise: NuStep 5 mins Passive knee flexion in sitting x5 Passive knee flexion in prone x5 Interval straight leg raises RLE x20 SAQs RLE with 3 lb weight 15x2 Sidelying RLE straight leg hip abduction 10x2 Sidelying RLE bent knee hip abduction 10x2 Prone hip extensions RLE 10x2 3 way hip on matrix machine BLE x10 Leg press 100 lbs 20x4 Leg press single leg 90 lbs 20x2   ROM: Knee flexion: 111 degrees Knee extension 0 degrees  Patient required verbal cueing to keep leg straight during prone hip extensions. Patient needed consistent verbal cueing to slow down in order to perform exercises correctly. Patient presents with no pain at the beginning or end of the session.                PT Long Term Goals - 03/11/16 1109      PT LONG TERM GOAL #1   Title Patient  will report a worst pain of 3/10 on VAS in right knee            to improve tolerance with ADLs and reduced symptoms with activities.    Time 8   Period Weeks   Status Achieved     PT LONG TERM GOAL #2   Title Patient will be independent in home exercise program to improve strength/mobility for better functional independence with ADLs.   Time 8   Period Weeks   Status Achieved     PT LONG TERM GOAL #3   Title Patient will increase BLE gross strength to 4+/5 as to improve functional strength for independent gait, increased standing tolerance and increased ADL ability.   Time 78   Status Achieved     PT LONG TERM GOAL #4   Title Patient (> 78 years old) will complete five times sit to stand test in < 15 seconds indicating an increased LE strength and improved balance.   Time 78   Status Achieved     PT LONG TERM GOAL #5   Title Patient will  increase six minute walk test distance to >1000 for progression to community ambulator and improve gait ability   Time 8   Status On-going               Plan - 2016-03-21 1011    Clinical Impression Statement Patient he is doing well and has no pain. Patient's knee flexion ROM has improved to 111 degrees and he is pleased with his progress. Patient will be discharged today from therapy to his HEP.   Rehab Potential Good   Clinical Impairments Affecting Rehab Potential diabetes, bladder CA, high blood pressure, Letf hip THR 2011   PT Frequency 2x / week   PT Duration 8 weeks   PT Treatment/Interventions Cryotherapy;Electrical Stimulation;Moist Heat;Therapeutic activities;Gait training;Therapeutic exercise;Patient/family education;Passive range of motion   PT Next Visit Plan ther ex and manual therapy   PT Home Exercise Plan As prescribed   Consulted and Agree with Plan of Care Patient      Patient will benefit from skilled therapeutic intervention in order to improve the following deficits and impairments:  Abnormal gait, Decreased mobility, Difficulty walking, Decreased range of motion, Pain, Decreased strength, Decreased activity tolerance  Visit Diagnosis: Difficulty in walking, not elsewhere classified  Muscle weakness (generalized)       G-Codes - 03-21-16 1111    Functional Assessment Tool Used TUg, 10 MW, 6 MW, 5 x sit to stand   Functional Limitation Mobility: Walking and moving around   Mobility: Walking and Moving Around Current Status [phone]) At least 40 percent but less than 60 percent impaired, limited or restricted   Mobility: Walking and Moving Around Goal Status [phone]) At least 20 percent but less than 40 percent impaired, limited or restricted   Mobility: Walking and Moving Around Discharge Status [phone]) At least 20 percent but less than 40 percent impaired, limited or restricted      Problem List Patient Active Problem List   Diagnosis Date Noted  .  Status post right knee replacement 02/06/2016  . Osteoarthritis of right knee 01/12/2016  . Aortic valve stenosis, moderate 05/30/2015  . Bladder cancer ([place]) 11/15/2014  . Hyperlipidemia 11/15/2014  . ED (erectile dysfunction) 11/15/2014  . Osteoarthritis 11/15/2014  . Hypertension 11/15/2014  . Heart murmur 11/15/2014  . 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 Anemia 11/15/2014   This entire session was  performed under direct supervision and direction of a licensed Chiropractor . I have personally read, edited and approve of the note as written. Alanson Puls, PT, DPT [place], SPT [place], Sherryl Barters 03/11/2016, 11:13 AM  [place] MAIN [facility] SERVICES [address] [place], [place], [address] Phone: [phone]   Fax:  [phone]  Name: John [name] MRN: [id number] Date of Birth: 1940/07/27

## 2016-03-30 DIAGNOSIS — Z96653 Presence of artificial knee joint, bilateral: Secondary | ICD-10-CM | POA: Diagnosis not present

## 2016-03-30 DIAGNOSIS — I10 Essential (primary) hypertension: Secondary | ICD-10-CM | POA: Diagnosis not present

## 2016-03-30 DIAGNOSIS — Z906 Acquired absence of other parts of urinary tract: Secondary | ICD-10-CM | POA: Diagnosis not present

## 2016-03-30 DIAGNOSIS — C679 Malignant neoplasm of bladder, unspecified: Secondary | ICD-10-CM | POA: Diagnosis not present

## 2016-03-30 DIAGNOSIS — Z79899 Other long term (current) drug therapy: Secondary | ICD-10-CM | POA: Diagnosis not present

## 2016-03-30 DIAGNOSIS — E119 Type 2 diabetes mellitus without complications: Secondary | ICD-10-CM | POA: Diagnosis not present

## 2016-03-30 DIAGNOSIS — Z01818 Encounter for other preprocedural examination: Secondary | ICD-10-CM | POA: Diagnosis not present

## 2016-03-30 DIAGNOSIS — R05 Cough: Secondary | ICD-10-CM | POA: Diagnosis not present

## 2016-03-30 DIAGNOSIS — Z87891 Personal history of nicotine dependence: Secondary | ICD-10-CM | POA: Diagnosis not present

## 2016-03-30 DIAGNOSIS — E785 Hyperlipidemia, unspecified: Secondary | ICD-10-CM | POA: Diagnosis not present

## 2016-03-30 DIAGNOSIS — R319 Hematuria, unspecified: Secondary | ICD-10-CM | POA: Diagnosis not present

## 2016-04-05 DIAGNOSIS — E785 Hyperlipidemia, unspecified: Secondary | ICD-10-CM | POA: Diagnosis not present

## 2016-04-05 DIAGNOSIS — C674 Malignant neoplasm of posterior wall of bladder: Secondary | ICD-10-CM | POA: Diagnosis not present

## 2016-04-05 DIAGNOSIS — D090 Carcinoma in situ of bladder: Secondary | ICD-10-CM | POA: Diagnosis not present

## 2016-04-05 DIAGNOSIS — Z8551 Personal history of malignant neoplasm of bladder: Secondary | ICD-10-CM | POA: Diagnosis not present

## 2016-04-05 DIAGNOSIS — C679 Malignant neoplasm of bladder, unspecified: Secondary | ICD-10-CM | POA: Diagnosis not present

## 2016-04-05 DIAGNOSIS — Z79899 Other long term (current) drug therapy: Secondary | ICD-10-CM | POA: Diagnosis not present

## 2016-04-05 DIAGNOSIS — N3289 Other specified disorders of bladder: Secondary | ICD-10-CM | POA: Diagnosis not present

## 2016-04-05 DIAGNOSIS — I10 Essential (primary) hypertension: Secondary | ICD-10-CM | POA: Diagnosis not present

## 2016-04-05 DIAGNOSIS — Z8550 Personal history of malignant neoplasm of unspecified urinary tract organ: Secondary | ICD-10-CM | POA: Diagnosis not present

## 2016-04-05 DIAGNOSIS — E119 Type 2 diabetes mellitus without complications: Secondary | ICD-10-CM | POA: Diagnosis not present

## 2016-04-05 DIAGNOSIS — Z906 Acquired absence of other parts of urinary tract: Secondary | ICD-10-CM | POA: Diagnosis not present

## 2016-04-05 DIAGNOSIS — Z87891 Personal history of nicotine dependence: Secondary | ICD-10-CM | POA: Diagnosis not present

## 2016-04-05 DIAGNOSIS — Z08 Encounter for follow-up examination after completed treatment for malignant neoplasm: Secondary | ICD-10-CM | POA: Diagnosis not present

## 2016-04-05 DIAGNOSIS — Z8554 Personal history of malignant neoplasm of ureter: Secondary | ICD-10-CM | POA: Diagnosis not present

## 2016-05-24 ENCOUNTER — Telehealth: Payer: Self-pay

## 2016-05-24 ENCOUNTER — Encounter: Payer: Self-pay | Admitting: Unknown Physician Specialty

## 2016-05-24 ENCOUNTER — Ambulatory Visit (INDEPENDENT_AMBULATORY_CARE_PROVIDER_SITE_OTHER): Payer: PPO | Admitting: Unknown Physician Specialty

## 2016-05-24 VITALS — BP 136/73 | HR 80 | Temp 98.3°F | Ht 71.8 in | Wt 203.9 lb

## 2016-05-24 DIAGNOSIS — N181 Chronic kidney disease, stage 1: Secondary | ICD-10-CM | POA: Diagnosis not present

## 2016-05-24 DIAGNOSIS — M7750 Other enthesopathy of unspecified foot: Secondary | ICD-10-CM

## 2016-05-24 DIAGNOSIS — I10 Essential (primary) hypertension: Secondary | ICD-10-CM

## 2016-05-24 DIAGNOSIS — E1122 Type 2 diabetes mellitus with diabetic chronic kidney disease: Secondary | ICD-10-CM

## 2016-05-24 DIAGNOSIS — E7800 Pure hypercholesterolemia, unspecified: Secondary | ICD-10-CM | POA: Diagnosis not present

## 2016-05-24 LAB — BAYER DCA HB A1C WAIVED: HB A1C: 6.9 % (ref <7.0)

## 2016-05-24 MED ORDER — AMLODIPINE BESYLATE 5 MG PO TABS: 5.0000 mg | ORAL_TABLET | Freq: Every day | ORAL | 3 refills | Status: DC

## 2016-05-24 MED ORDER — SITAGLIP PHOS-METFORMIN HCL ER 50-1000 MG PO TB24: 1.0000 | ORAL_TABLET | Freq: Two times a day (BID) | ORAL | 3 refills | Status: DC

## 2016-05-24 MED ORDER — LOVASTATIN 40 MG PO TABS: 40.0000 mg | ORAL_TABLET | Freq: Every day | ORAL | 3 refills | Status: DC

## 2016-05-24 MED ORDER — SITAGLIP PHOS-METFORMIN HCL ER 50-1000 MG PO TB24: 1.0000 | ORAL_TABLET | Freq: Two times a day (BID) | ORAL | 5 refills | Status: DC

## 2016-05-24 MED ORDER — LOSARTAN POTASSIUM-HCTZ 50-12.5 MG PO TABS: 1.0000 | ORAL_TABLET | Freq: Every day | ORAL | 3 refills | Status: DC

## 2016-05-24 NOTE — Assessment & Plan Note (Signed)
Stable, continue present medications.   

## 2016-05-24 NOTE — Progress Notes (Addendum)
BP 136/73 (BP Location: Right Arm, Cuff Size: Normal)   Pulse 80   Temp 98.3 F (36.8 C)   Ht 5' 11.8" (1.824 m) Comment: pt had shoes on  Wt 203 lb 14.4 oz (92.5 kg) Comment: pt had shoes on  SpO2 96%   BMI 27.81 kg/m    Subjective:    Patient ID: John [name], male    DOB: 02-Dec-1940, 76 y.o.   MRN: [id number]  HPI: John [name] is a 76 y.o. male  Chief Complaint  Patient presents with  . Diabetes  . Hyperlipidemia  . Hypertension   Diabetes: Using medications without difficulties No hypoglycemic episodes No hyperglycemic episodes Feet problems: none Blood Sugars averaging:Not checking eye exam within last year Last Hgb A1C:6.7  Hypertension  Using medications without difficulty Average home BPs Not checkoing  Using medication without problems or lightheadedness No chest pain with exertion or shortness of breath No Edema  Elevated Cholesterol Using medications without problems No Muscle aches  Diet/Exercise: Exercising every other day by riding on a life-cycle 15 minutes/day.    Relevant past medical, surgical, family and social history reviewed and updated as indicated. Interim medical history since our last visit reviewed. Allergies and medications reviewed and updated.  Review of Systems  Musculoskeletal: Negative.        Right foot pain on top of foot  Neurological: Positive for tremors.       Tremors when writing    Per HPI unless specifically indicated above     Objective:    BP 136/73 (BP Location: Right Arm, Cuff Size: Normal)   Pulse 80   Temp 98.3 F (36.8 C)   Ht 5' 11.8" (1.824 m) Comment: pt had shoes on  Wt 203 lb 14.4 oz (92.5 kg) Comment: pt had shoes on  SpO2 96%   BMI 27.81 kg/m   Wt Readings from Last 3 Encounters:  05/24/16 203 lb 14.4 oz (92.5 kg)  02/06/16 203 lb (92.1 kg)  01/12/16 211 lb (95.7 kg)    Physical Exam  Constitutional: He is oriented to person, place, and time. He appears well-developed and  well-nourished. No distress.  HENT:  Head: Normocephalic and atraumatic.  Eyes: Conjunctivae and lids are normal. Right eye exhibits no discharge. Left eye exhibits no discharge. No scleral icterus.  Neck: Normal range of motion. Neck supple. No JVD present. Carotid bruit is not present.  Cardiovascular: Normal rate and regular rhythm.   Murmur heard.  Systolic murmur is present with a grade of 4/6  Pulmonary/Chest: Effort normal and breath sounds normal. No respiratory distress.  Abdominal: Normal appearance. There is no splenomegaly or hepatomegaly.  Musculoskeletal: Normal range of motion.  Neurological: He is alert and oriented to person, place, and time.  Skin: Skin is warm, dry and intact. No rash noted. No pallor.  Psychiatric: He has a normal mood and affect. His behavior is normal. Judgment and thought content normal.    Results for orders placed or performed during the hospital encounter of 01/12/16  Glucose, capillary  Result Value Ref Range   Glucose-Capillary 150 (H) 65 - 99 mg/dL  Glucose, capillary  Result Value Ref Range   Glucose-Capillary 175 (H) 65 - 99 mg/dL  CBC  Result Value Ref Range   WBC 10.0 4.0 - 10.5 K/uL   RBC 3.58 (L) 4.22 - 5.81 MIL/uL   Hemoglobin 9.6 (L) 13.0 - 17.0 g/dL   HCT 29.8 (L) 39.0 - 52.0 %   MCV 83.2 78.0 -  100.0 fL   MCH 26.8 26.0 - 34.0 pg   MCHC 32.2 30.0 - 36.0 g/dL   RDW 13.1 11.5 - 15.5 %   Platelets 199 150 - 400 K/uL  Basic metabolic panel  Result Value Ref Range   Sodium 138 135 - 145 mmol/L   Potassium 3.9 3.5 - 5.1 mmol/L   Chloride 102 101 - 111 mmol/L   CO2 27 22 - 32 mmol/L   Glucose, Bld 190 (H) 65 - 99 mg/dL   BUN 24 (H) 6 - 20 mg/dL   Creatinine, Ser 1.02 0.61 - 1.24 mg/dL   Calcium 8.0 (L) 8.9 - 10.3 mg/dL   GFR calc non Af Amer >60 >60 mL/min   GFR calc Af Amer >60 >60 mL/min   Anion gap 9 5 - 15  Glucose, capillary  Result Value Ref Range   Glucose-Capillary 294 (H) 65 - 99 mg/dL  Glucose, capillary    Result Value Ref Range   Glucose-Capillary 161 (H) 65 - 99 mg/dL  Glucose, capillary  Result Value Ref Range   Glucose-Capillary 153 (H) 65 - 99 mg/dL      Assessment & Plan:   Problem List Items Addressed This Visit      Unprioritized   Hyperlipidemia - Primary   Relevant Medications   amLODipine (NORVASC) 5 MG tablet   lovastatin (MEVACOR) 40 MG tablet   losartan-hydrochlorothiazide (HYZAAR) 50-12.5 MG tablet   Other Relevant Orders   Comprehensive metabolic panel   Lipid Panel w/o Chol/HDL Ratio   Hypertension    Stable, continue present medications.        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  Stable.  Continue current medications      Relevant Medications   lovastatin (MEVACOR) 40 MG tablet   losartan-hydrochlorothiazide (HYZAAR) 50-12.5 MG tablet   SitaGLIPtin-MetFORMIN HCl (JANUMET XR) 50-1000 MG TB24   Other Relevant Orders   Bayer DCA Hb A1c Waived    Other Visit Diagnoses    Tendonitis of ankle or foot       rt foot with normal exam.  Discussed arch supports       Follow up plan: Return in about 6 months (around 11/24/2016) for physical.

## 2016-05-24 NOTE — Assessment & Plan Note (Signed)
Hgb A1C was 6.9.  Stable.  Continue current medications

## 2016-05-24 NOTE — Telephone Encounter (Signed)
Pharmacy sent a fax requesting that a 90 day supply of patient's Janumet XR 50-1000 mg tablets be sent in. Pharmacy is [facility].

## 2016-05-25 ENCOUNTER — Encounter: Payer: Self-pay | Admitting: Unknown Physician Specialty

## 2016-05-25 LAB — COMPREHENSIVE METABOLIC PANEL
ALBUMIN: 4.1 g/dL (ref 3.5–4.8)
ALT: 22 IU/L (ref 0–44)
AST: 27 IU/L (ref 0–40)
Albumin/Globulin Ratio: 1.4 (ref 1.2–2.2)
Alkaline Phosphatase: 67 IU/L (ref 39–117)
BUN/Creatinine Ratio: 18 (ref 10–24)
BUN: 18 mg/dL (ref 8–27)
Bilirubin Total: 0.4 mg/dL (ref 0.0–1.2)
CALCIUM: 9.1 mg/dL (ref 8.6–10.2)
CO2: 29 mmol/L (ref 18–29)
CREATININE: 1.01 mg/dL (ref 0.76–1.27)
Chloride: 99 mmol/L (ref 96–106)
GFR calc Af Amer: 82 mL/min/{1.73_m2} (ref >59)
GFR, EST NON AFRICAN AMERICAN: 71 mL/min/{1.73_m2} (ref >59)
GLOBULIN, TOTAL: 2.9 g/dL (ref 1.5–4.5)
Glucose: 141 mg/dL — ABNORMAL HIGH (ref 65–99)
Potassium: 4 mmol/L (ref 3.5–5.2)
SODIUM: 142 mmol/L (ref 134–144)
Total Protein: 7 g/dL (ref 6.0–8.5)

## 2016-05-25 LAB — LIPID PANEL W/O CHOL/HDL RATIO
Cholesterol, Total: 179 mg/dL (ref 100–199)
HDL: 47 mg/dL (ref >39)
LDL CALC: 108 mg/dL — AB (ref 0–99)
TRIGLYCERIDES: 120 mg/dL (ref 0–149)
VLDL Cholesterol Cal: 24 mg/dL (ref 5–40)

## 2016-06-14 DIAGNOSIS — I10 Essential (primary) hypertension: Secondary | ICD-10-CM | POA: Diagnosis not present

## 2016-06-14 DIAGNOSIS — I350 Nonrheumatic aortic (valve) stenosis: Secondary | ICD-10-CM | POA: Diagnosis not present

## 2016-06-14 DIAGNOSIS — E119 Type 2 diabetes mellitus without complications: Secondary | ICD-10-CM | POA: Diagnosis not present

## 2016-06-14 DIAGNOSIS — E785 Hyperlipidemia, unspecified: Secondary | ICD-10-CM | POA: Diagnosis not present

## 2016-06-29 DIAGNOSIS — E119 Type 2 diabetes mellitus without complications: Secondary | ICD-10-CM | POA: Diagnosis not present

## 2016-06-29 LAB — HM DIABETES EYE EXAM

## 2016-07-05 DIAGNOSIS — Z129 Encounter for screening for malignant neoplasm, site unspecified: Secondary | ICD-10-CM | POA: Diagnosis not present

## 2016-07-05 DIAGNOSIS — K8020 Calculus of gallbladder without cholecystitis without obstruction: Secondary | ICD-10-CM | POA: Diagnosis not present

## 2016-07-05 DIAGNOSIS — C679 Malignant neoplasm of bladder, unspecified: Secondary | ICD-10-CM | POA: Diagnosis not present

## 2016-07-05 DIAGNOSIS — C674 Malignant neoplasm of posterior wall of bladder: Secondary | ICD-10-CM | POA: Diagnosis not present

## 2016-07-08 DIAGNOSIS — R350 Frequency of micturition: Secondary | ICD-10-CM | POA: Diagnosis not present

## 2016-07-08 DIAGNOSIS — R32 Unspecified urinary incontinence: Secondary | ICD-10-CM | POA: Diagnosis not present

## 2016-07-14 ENCOUNTER — Other Ambulatory Visit: Payer: Self-pay | Admitting: Pharmacy Technician

## 2016-07-14 ENCOUNTER — Other Ambulatory Visit: Payer: Self-pay | Admitting: Unknown Physician Specialty

## 2016-07-14 MED ORDER — SITAGLIP PHOS-METFORMIN HCL ER 50-1000 MG PO TB24: 1.0000 | ORAL_TABLET | Freq: Two times a day (BID) | ORAL | 3 refills | Status: DC

## 2016-07-14 NOTE — Telephone Encounter (Signed)
Rx sent to his pharmacy

## 2016-07-14 NOTE — Telephone Encounter (Signed)
Routing to provider  

## 2016-07-14 NOTE — Patient Outreach (Signed)
[facility]) Care Management  07/14/2016  John [name] 03-10-1940 [id number]  Contacted patient in reference to medication adherence for [employer]. Patient states he is taking Janumet XR and Lovastatin as prescribed. He was given samples so it has affected his adherence rate for Janumet. I discussed 3 month supplies with patient and explained that he would save a co-pay if he is willing to have his prescription changed. At the patient's request I contacted the office of Kathrine Haddock and requested for a 3 month prescription to be sent in to [facility] on [address]. This will help patient with cost, convenience, and adherence to his medication's.  Doreene Burke, [place] [phone]

## 2016-07-14 NOTE — Telephone Encounter (Signed)
Crystal with [employer] called and stated that the pt would receive a free month if he can have SitaGLIPtin-MetFORMIN HCl (JANUMET XR) 50-1000 MG TB24 sent in for 90 day supply. Pharmacy is [facility] [address]

## 2016-08-24 DIAGNOSIS — Z96651 Presence of right artificial knee joint: Secondary | ICD-10-CM | POA: Diagnosis not present

## 2016-08-24 DIAGNOSIS — M25511 Pain in right shoulder: Secondary | ICD-10-CM | POA: Diagnosis not present

## 2016-08-24 DIAGNOSIS — Z471 Aftercare following joint replacement surgery: Secondary | ICD-10-CM | POA: Diagnosis not present

## 2016-08-24 DIAGNOSIS — M25661 Stiffness of right knee, not elsewhere classified: Secondary | ICD-10-CM | POA: Diagnosis not present

## 2016-08-31 DIAGNOSIS — M25511 Pain in right shoulder: Secondary | ICD-10-CM | POA: Diagnosis not present

## 2016-09-08 DIAGNOSIS — M25511 Pain in right shoulder: Secondary | ICD-10-CM | POA: Diagnosis not present

## 2016-09-08 DIAGNOSIS — M75101 Unspecified rotator cuff tear or rupture of right shoulder, not specified as traumatic: Secondary | ICD-10-CM | POA: Diagnosis not present

## 2016-09-08 DIAGNOSIS — M12811 Other specific arthropathies, not elsewhere classified, right shoulder: Secondary | ICD-10-CM | POA: Diagnosis not present

## 2016-09-08 DIAGNOSIS — G8929 Other chronic pain: Secondary | ICD-10-CM | POA: Diagnosis not present

## 2016-10-06 DIAGNOSIS — M12811 Other specific arthropathies, not elsewhere classified, right shoulder: Secondary | ICD-10-CM | POA: Diagnosis not present

## 2016-10-06 DIAGNOSIS — M75101 Unspecified rotator cuff tear or rupture of right shoulder, not specified as traumatic: Secondary | ICD-10-CM | POA: Diagnosis not present

## 2016-11-03 ENCOUNTER — Ambulatory Visit (INDEPENDENT_AMBULATORY_CARE_PROVIDER_SITE_OTHER): Payer: PPO

## 2016-11-03 VITALS — BP 136/65 | HR 71 | Temp 98.0°F | Resp 16 | Ht 72.0 in | Wt 201.6 lb

## 2016-11-03 DIAGNOSIS — Z23 Encounter for immunization: Secondary | ICD-10-CM

## 2016-11-03 DIAGNOSIS — Z0000 Encounter for general adult medical examination without abnormal findings: Secondary | ICD-10-CM | POA: Diagnosis not present

## 2016-11-03 NOTE — Progress Notes (Signed)
Subjective:   John [name] is a 76 y.o. male who presents for Medicare Annual/Subsequent preventive examination.  Review of Systems:  Cardiac Risk Factors include: male gender;advanced age (>63men, >52 women);hypertension;dyslipidemia;diabetes mellitus     Objective:    Vitals: BP 136/65 (BP Location: Left Arm, Patient Position: Sitting)   Pulse 71   Temp 98 F (36.7 C)   Resp 16   Ht 6' (1.829 m)   Wt 201 lb 9.6 oz (91.4 kg)   BMI 27.34 kg/m   Body mass index is 27.34 kg/m.  Tobacco History  Smoking Status  . Former Smoker  . Quit date: 12/15/1980  Smokeless Tobacco  . Never Used     Counseling given: Not Answered   Past Medical History:  Diagnosis Date  . Anemia   . Cancer ([place])    bladder  . Cataracts, bilateral   . Diabetes mellitus without complication ([place])    takes Janumet daily  . Dyspnea   . Heart murmur   . History of colon polyps    benign  . Hyperlipidemia    takes Lovastatin daily  . Hypertension    takes Hyzaar and Amlodipine daily  . Joint pain   . Muscle cramps    occasionally in legs   . Osteoarthritis   . Urinary urgency    Past Surgical History:  Procedure Laterality Date  . BLADDER SURGERY    . COLONOSCOPY    . CYSTOURETHROSCOPY    . JOINT REPLACEMENT Left    hip  . KNEE ARTHROPLASTY Right 01/12/2016   Procedure: RIGHT  TOTAL KNEE ARTHROPLASTY WITH COMPUTER NAVIGATION;  Surgeon: Rod Can, MD;  Location: [facility];  Service: Orthopedics;  Laterality: Right;  Needs RNFA  . SKIN SURGERY Left    arm   Family History  Problem Relation Age of Onset  . Dementia Mother   . Stroke Father   . Diabetes Father   . Heart disease Father   . Cancer Sister   . Hypertension Brother   . Cancer Sister    History  Sexual Activity  . Sexual activity: Yes    Outpatient Encounter Prescriptions as of 11/03/2016  Medication Sig  . amLODipine (NORVASC) 5 MG tablet Take 1 tablet (5 mg total) by mouth daily.  . Cholecalciferol (VITAMIN  D) 2000 units tablet Take 2,000 Units by mouth daily.  . Cyanocobalamin (RA VITAMIN B-12 TR) 1000 MCG TBCR Take 500 mcg by mouth daily.   Marland Kitchen losartan-hydrochlorothiazide (HYZAAR) 50-12.5 MG tablet Take 1 tablet by mouth daily.  Marland Kitchen lovastatin (MEVACOR) 40 MG tablet Take 1 tablet (40 mg total) by mouth daily.  . SitaGLIPtin-MetFORMIN HCl (JANUMET XR) 50-1000 MG TB24 Take 1 tablet by mouth 2 (two) times daily.   No facility-administered encounter medications on file as of 11/03/2016.     Activities of Daily Living In your present state of health, do you have any difficulty performing the following activities: 11/03/2016 01/05/2016  Hearing? Y N  Vision? N N  Difficulty concentrating or making decisions? N N  Walking or climbing stairs? N Y  Dressing or bathing? N N  Doing errands, shopping? N N  Preparing Food and eating ? N -  Using the Toilet? N -  In the past six months, have you accidently leaked urine? N -  Do you have problems with loss of bowel control? N -  Managing your Medications? N -  Managing your Finances? N -  Housekeeping or managing your Housekeeping? N -  Some recent data might be hidden    Patient Care Team: Kathrine Haddock, NP as PCP - General (Nurse Practitioner) Kathrine Haddock, NP as PCP - Family Medicine (Nurse Practitioner) Rod Can, MD as Consulting Physician (Orthopedic Surgery) Isaias Cowman, MD as Consulting Physician (Cardiology)   Assessment:     Exercise Activities and Dietary recommendations Current Exercise Habits: Home exercise routine, Time (Minutes): 15, Frequency (Times/Week): 4, Weekly Exercise (Minutes/Week): 60, Intensity: Mild, Exercise limited by: None identified  Goals    None     Fall Risk Fall Risk  11/03/2016 11/25/2015 05/19/2015 11/18/2014  Falls in the past year? No No No No   Depression Screen PHQ 2/9 Scores 11/03/2016 11/25/2015 05/19/2015  PHQ - 2 Score 0 2 0  PHQ- 9 Score - 4 -    Cognitive Function     6CIT Screen  11/03/2016  What Year? 0 points  What month? 0 points  What time? 0 points  Count back from 20 0 points  Months in reverse 0 points  Repeat phrase 2 points  Total Score 2    Immunization History  Administered Date(s) Administered  . Influenza, High Dose Seasonal PF 11/25/2015, 11/03/2016  . Influenza,inj,Quad PF,6+ Mos 11/18/2014  . Pneumococcal Conjugate-13 11/23/2013  . Pneumococcal Polysaccharide-23 05/24/2014  . Zoster 05/19/1957   Screening Tests Health Maintenance  Topic Date Due  . TETANUS/TDAP  05/24/2017 (Originally 03/25/1959)  . COLONOSCOPY  10/31/2018 (Originally 03/01/2010)  . HEMOGLOBIN A1C  11/24/2016  . FOOT EXAM  05/24/2017  . OPHTHALMOLOGY EXAM  06/29/2017  . INFLUENZA VACCINE  Completed  . PNA vac Low Risk Adult  Completed      Plan:    I have personally reviewed and addressed the Medicare Annual Wellness questionnaire and have noted the following in the patient's chart:  A. Medical and social history B. Use of alcohol, tobacco or illicit drugs  C. Current medications and supplements D. Functional ability and status E.  Nutritional status F.  Physical activity G. Advance directives H. List of other physicians I.  Hospitalizations, surgeries, and ER visits in previous 12 months J.  Cache such as hearing and vision if needed, cognitive and depression L. Referrals and appointments   In addition, I have reviewed and discussed with patient certain preventive protocols, quality metrics, and best practice recommendations. A written personalized care plan for preventive services as well as general preventive health recommendations were provided to patient.   Signed,  Tyler Aas, LPN Nurse Health Advisor   MD Recommendations: none

## 2016-11-03 NOTE — Patient Instructions (Addendum)
John [name] , Thank you for taking time to come for your Medicare Wellness Visit. I appreciate your ongoing commitment to your health goals. Please review the following plan we discussed and let me know if I can assist you in the future.   Screening recommendations/referrals: Colonoscopy: no longer required Recommended yearly ophthalmology/optometry visit for glaucoma screening and checkup Recommended yearly dental visit for hygiene and checkup  Vaccinations: Influenza vaccine: done now Pneumococcal vaccine: up to date Tdap vaccine: due, check with your insurance company for coverage Shingles vaccine: up to date  Advanced directives: Please bring a copy of your health care power of attorney and living will to the office at your convenience.  Conditions/risks identified: none  Next appointment: Follow up on 12/07/2016 at 8:00am with Regino Schultze.Follow up in one year for your annual wellness exam.   Preventive Care 78 Years and Older, Male Preventive care refers to lifestyle choices and visits with your health care provider that can promote health and wellness. What does preventive care include?  A yearly physical exam. This is also called an annual well check.  Dental exams once or twice a year.  Routine eye exams. Ask your health care provider how often you should have your eyes checked.  Personal lifestyle choices, including:  Daily care of your teeth and gums.  Regular physical activity.  Eating a healthy diet.  Avoiding tobacco and drug use.  Limiting alcohol use.  Practicing safe sex.  Taking low doses of aspirin every day.  Taking vitamin and mineral supplements as recommended by your health care provider. What happens during an annual well check? The services and screenings done by your health care provider during your annual well check will depend on your age, overall health, lifestyle risk factors, and family history of disease. Counseling  Your health  care provider may ask you questions about your:  Alcohol use.  Tobacco use.  Drug use.  Emotional well-being.  Home and relationship well-being.  Sexual activity.  Eating habits.  History of falls.  Memory and ability to understand (cognition).  Work and work Statistician. Screening  You may have the following tests or measurements:  Height, weight, and BMI.  Blood pressure.  Lipid and cholesterol levels. These may be checked every 5 years, or more frequently if you are over 78 years old.  Skin check.  Lung cancer screening. You may have this screening every year starting at age 78 if you have a 30-pack-year history of smoking and currently smoke or have quit within the past 15 years.  Fecal occult blood test (FOBT) of the stool. You may have this test every year starting at age 78.  Flexible sigmoidoscopy or colonoscopy. You may have a sigmoidoscopy every 5 years or a colonoscopy every 10 years starting at age 78.  Prostate cancer screening. Recommendations will vary depending on your family history and other risks.  Hepatitis C blood test.  Hepatitis B blood test.  Sexually transmitted disease (STD) testing.  Diabetes screening. This is done by checking your blood sugar (glucose) after you have not eaten for a while (fasting). You may have this done every 1-3 years.  Abdominal aortic aneurysm (AAA) screening. You may need this if you are a current or former smoker.  Osteoporosis. You may be screened starting at age 38 if you are at high risk. Talk with your health care provider about your test results, treatment options, and if necessary, the need for more tests. Vaccines  Your health care provider may  recommend certain vaccines, such as:  Influenza vaccine. This is recommended every year.  Tetanus, diphtheria, and acellular pertussis (Tdap, Td) vaccine. You may need a Td booster every 10 years.  Zoster vaccine. You may need this after age  78.  Pneumococcal 13-valent conjugate (PCV13) vaccine. One dose is recommended after age 78.  Pneumococcal polysaccharide (PPSV23) vaccine. One dose is recommended after age 78. Talk to your health care provider about which screenings and vaccines you need and how often you need them. This information is not intended to replace advice given to you by your health care provider. Make sure you discuss any questions you have with your health care provider. Document Released: 03/14/2015 Document Revised: 11/05/2015 Document Reviewed: 12/17/2014 Elsevier Interactive Patient Education  2017 [place] Prevention in the Home Falls can cause injuries. They can happen to people of all ages. There are many things you can do to make your home safe and to help prevent falls. What can I do on the outside of my home?  Regularly fix the edges of walkways and driveways and fix any cracks.  Remove anything that might make you trip as you walk through a door, such as a raised step or threshold.  Trim any bushes or trees on the path to your home.  Use bright outdoor lighting.  Clear any walking paths of anything that might make someone trip, such as rocks or tools.  Regularly check to see if handrails are loose or broken. Make sure that both sides of any steps have handrails.  Any raised decks and porches should have guardrails on the edges.  Have any leaves, snow, or ice cleared regularly.  Use sand or salt on walking paths during winter.  Clean up any spills in your garage right away. This includes oil or grease spills. What can I do in the bathroom?  Use night lights.  Install grab bars by the toilet and in the tub and shower. Do not use towel bars as grab bars.  Use non-skid mats or decals in the tub or shower.  If you need to sit down in the shower, use a plastic, non-slip stool.  Keep the floor dry. Clean up any water that spills on the floor as soon as it happens.  Remove  soap buildup in the tub or shower regularly.  Attach bath mats securely with double-sided non-slip rug tape.  Do not have throw rugs and other things on the floor that can make you trip. What can I do in the bedroom?  Use night lights.  Make sure that you have a light by your bed that is easy to reach.  Do not use any sheets or blankets that are too big for your bed. They should not hang down onto the floor.  Have a firm chair that has side arms. You can use this for support while you get dressed.  Do not have throw rugs and other things on the floor that can make you trip. What can I do in the kitchen?  Clean up any spills right away.  Avoid walking on wet floors.  Keep items that you use a lot in easy-to-reach places.  If you need to reach something above you, use a strong step stool that has a grab bar.  Keep electrical cords out of the way.  Do not use floor polish or wax that makes floors slippery. If you must use wax, use non-skid floor wax.  Do not have throw rugs and  other things on the floor that can make you trip. What can I do with my stairs?  Do not leave any items on the stairs.  Make sure that there are handrails on both sides of the stairs and use them. Fix handrails that are broken or loose. Make sure that handrails are as long as the stairways.  Check any carpeting to make sure that it is firmly attached to the stairs. Fix any carpet that is loose or worn.  Avoid having throw rugs at the top or bottom of the stairs. If you do have throw rugs, attach them to the floor with carpet tape.  Make sure that you have a light switch at the top of the stairs and the bottom of the stairs. If you do not have them, ask someone to add them for you. What else can I do to help prevent falls?  Wear shoes that:  Do not have high heels.  Have rubber bottoms.  Are comfortable and fit you well.  Are closed at the toe. Do not wear sandals.  If you use a  stepladder:  Make sure that it is fully opened. Do not climb a closed stepladder.  Make sure that both sides of the stepladder are locked into place.  Ask someone to hold it for you, if possible.  Clearly mark and make sure that you can see:  Any grab bars or handrails.  First and last steps.  Where the edge of each step is.  Use tools that help you move around (mobility aids) if they are needed. These include:  Canes.  Walkers.  Scooters.  Crutches.  Turn on the lights when you go into a dark area. Replace any light bulbs as soon as they burn out.  Set up your furniture so you have a clear path. Avoid moving your furniture around.  If any of your floors are uneven, fix them.  If there are any pets around you, be aware of where they are.  Review your medicines with your doctor. Some medicines can make you feel dizzy. This can increase your chance of falling. Ask your doctor what other things that you can do to help prevent falls. This information is not intended to replace advice given to you by your health care provider. Make sure you discuss any questions you have with your health care provider. Document Released: 12/12/2008 Document Revised: 07/24/2015 Document Reviewed: 03/22/2014 Elsevier Interactive Patient Education  2017 [place].  Influenza (Flu) Vaccine (Inactivated or Recombinant): What You Need to Know 1. Why get vaccinated? Influenza ("flu") is a contagious disease that spreads around the [place] every year, usually between October and May. Flu is caused by influenza viruses, and is spread mainly by coughing, sneezing, and close contact. Anyone can get flu. Flu strikes suddenly and can last several days. Symptoms vary by age, but can include:  fever/chills  sore throat  muscle aches  fatigue  cough  headache  runny or stuffy nose  Flu can also lead to pneumonia and blood infections, and cause diarrhea and seizures in children. If you  have a medical condition, such as heart or lung disease, flu can make it worse. Flu is more dangerous for some people. Infants and young children, people 64 years of age and older, pregnant women, and people with certain health conditions or a weakened immune system are at greatest risk. Each year thousands of people in the [place] die from flu, and many more are hospitalized. Flu  vaccine can:  keep you from getting flu,  make flu less severe if you do get it, and  keep you from spreading flu to your family and other people. 2. Inactivated and recombinant flu vaccines A dose of flu vaccine is recommended every flu season. Children 6 months through 2 years of age may need two doses during the same flu season. Everyone else needs only one dose each flu season. Some inactivated flu vaccines contain a very small amount of a mercury-based preservative called thimerosal. Studies have not shown thimerosal in vaccines to be harmful, but flu vaccines that do not contain thimerosal are available. There is no live flu virus in flu shots. They cannot cause the flu. There are many flu viruses, and they are always changing. Each year a new flu vaccine is made to protect against three or four viruses that are likely to cause disease in the upcoming flu season. But even when the vaccine doesn't exactly match these viruses, it may still provide some protection. Flu vaccine cannot prevent:  flu that is caused by a virus not covered by the vaccine, or  illnesses that look like flu but are not.  It takes about 2 weeks for protection to develop after vaccination, and protection lasts through the flu season. 3. Some people should not get this vaccine Tell the person who is giving you the vaccine:  If you have any severe, life-threatening allergies. If you ever had a life-threatening allergic reaction after a dose of flu vaccine, or have a severe allergy to any part of this vaccine, you may be advised not to  get vaccinated. Most, but not all, types of flu vaccine contain a small amount of egg protein.  If you ever had Guillain-Barr Syndrome (also called GBS). Some people with a history of GBS should not get this vaccine. This should be discussed with your doctor.  If you are not feeling well. It is usually okay to get flu vaccine when you have a mild illness, but you might be asked to come back when you feel better.  4. Risks of a vaccine reaction With any medicine, including vaccines, there is a chance of reactions. These are usually mild and go away on their own, but serious reactions are also possible. Most people who get a flu shot do not have any problems with it. Minor problems following a flu shot include:  soreness, redness, or swelling where the shot was given  hoarseness  sore, red or itchy eyes  cough  fever  aches  headache  itching  fatigue  If these problems occur, they usually begin soon after the shot and last 1 or 2 days. More serious problems following a flu shot can include the following:  There may be a small increased risk of Guillain-Barre Syndrome (GBS) after inactivated flu vaccine. This risk has been estimated at 1 or 2 additional cases per million people vaccinated. This is much lower than the risk of severe complications from flu, which can be prevented by flu vaccine.  Young children who get the flu shot along with pneumococcal vaccine (PCV13) and/or DTaP vaccine at the same time might be slightly more likely to have a seizure caused by fever. Ask your doctor for more information. Tell your doctor if a child who is getting flu vaccine has ever had a seizure.  Problems that could happen after any injected vaccine:  People sometimes faint after a medical procedure, including vaccination. Sitting or lying down for about 15  minutes can help prevent fainting, and injuries caused by a fall. Tell your doctor if you feel dizzy, or have vision changes or ringing  in the ears.  Some people get severe pain in the shoulder and have difficulty moving the arm where a shot was given. This happens very rarely.  Any medication can cause a severe allergic reaction. Such reactions from a vaccine are very rare, estimated at about 1 in a million doses, and would happen within a few minutes to a few hours after the vaccination. As with any medicine, there is a very remote chance of a vaccine causing a serious injury or death. The safety of vaccines is always being monitored. For more information, visit: http://www.aguilar.org/ 5. What if there is a serious reaction? What should I look for? Look for anything that concerns you, such as signs of a severe allergic reaction, very high fever, or unusual behavior. Signs of a severe allergic reaction can include hives, swelling of the face and throat, difficulty breathing, a fast heartbeat, dizziness, and weakness. These would start a few minutes to a few hours after the vaccination. What should I do?  If you think it is a severe allergic reaction or other emergency that can't wait, call 9-1-1 and get the person to the nearest hospital. Otherwise, call your doctor.  Reactions should be reported to the Vaccine Adverse Event Reporting System (VAERS). Your doctor should file this report, or you can do it yourself through the VAERS web site at www.vaers.SamedayNews.es, or by calling 469 498 5623. ? VAERS does not give medical advice. 6. The National Vaccine Injury Compensation Program The [employer] Vaccine Injury Compensation Program (VICP) is a federal program that was created to compensate people who may have been injured by certain vaccines. Persons who believe they may have been injured by a vaccine can learn about the program and about filing a claim by calling 817 495 4472 or visiting the [place] website at GoldCloset.com.ee. There is a time limit to file a claim for compensation. 7. How can I learn more?  Ask  your healthcare provider. He or she can give you the vaccine package insert or suggest other sources of information.  Call your local or state health department.  Contact the Centers for Disease Control and Prevention (CDC): ? Call 514 059 4911 (1-800-CDC-INFO) or ? Visit CDC's website at https://gibson.com/ Vaccine Information Statement, Inactivated Influenza Vaccine (10/05/2013) This information is not intended to replace advice given to you by your health care provider. Make sure you discuss any questions you have with your health care provider. Document Released: 12/10/2005 Document Revised: 11/06/2015 Document Reviewed: 11/06/2015 Elsevier Interactive Patient Education  2017 [employer].

## 2016-11-08 DIAGNOSIS — C679 Malignant neoplasm of bladder, unspecified: Secondary | ICD-10-CM | POA: Diagnosis not present

## 2016-11-08 DIAGNOSIS — R897 Abnormal histological findings in specimens from other organs, systems and tissues: Secondary | ICD-10-CM | POA: Diagnosis not present

## 2016-12-07 ENCOUNTER — Ambulatory Visit (INDEPENDENT_AMBULATORY_CARE_PROVIDER_SITE_OTHER): Payer: PPO | Admitting: Unknown Physician Specialty

## 2016-12-07 ENCOUNTER — Other Ambulatory Visit: Payer: Self-pay | Admitting: Unknown Physician Specialty

## 2016-12-07 ENCOUNTER — Encounter: Payer: Self-pay | Admitting: Unknown Physician Specialty

## 2016-12-07 VITALS — BP 121/66 | HR 80 | Temp 97.9°F | Ht 71.0 in | Wt 202.0 lb

## 2016-12-07 DIAGNOSIS — N181 Chronic kidney disease, stage 1: Secondary | ICD-10-CM

## 2016-12-07 DIAGNOSIS — R5383 Other fatigue: Secondary | ICD-10-CM | POA: Diagnosis not present

## 2016-12-07 DIAGNOSIS — M19011 Primary osteoarthritis, right shoulder: Secondary | ICD-10-CM

## 2016-12-07 DIAGNOSIS — Z66 Do not resuscitate: Secondary | ICD-10-CM | POA: Diagnosis not present

## 2016-12-07 DIAGNOSIS — I10 Essential (primary) hypertension: Secondary | ICD-10-CM | POA: Diagnosis not present

## 2016-12-07 DIAGNOSIS — E1122 Type 2 diabetes mellitus with diabetic chronic kidney disease: Secondary | ICD-10-CM

## 2016-12-07 DIAGNOSIS — I350 Nonrheumatic aortic (valve) stenosis: Secondary | ICD-10-CM

## 2016-12-07 DIAGNOSIS — Z7689 Persons encountering health services in other specified circumstances: Secondary | ICD-10-CM | POA: Diagnosis not present

## 2016-12-07 DIAGNOSIS — E7800 Pure hypercholesterolemia, unspecified: Secondary | ICD-10-CM

## 2016-12-07 DIAGNOSIS — Z7189 Other specified counseling: Secondary | ICD-10-CM | POA: Insufficient documentation

## 2016-12-07 LAB — BAYER DCA HB A1C WAIVED: HB A1C: 6.4 % (ref <7.0)

## 2016-12-07 NOTE — Assessment & Plan Note (Signed)
Pt clear not wanting resuscitation.  DNR form filled out.

## 2016-12-07 NOTE — Assessment & Plan Note (Signed)
Not to goal today but usually better.  Also sees cardiology

## 2016-12-07 NOTE — Assessment & Plan Note (Signed)
A voluntary discussion about advance care planning including the explanation and discussion of advance directives was extensively discussed  with the patient.  Explanation about the health care proxy and Living will was reviewed and packet with forms with explanation of how to fill them out was given.  During this discussion, the patient was able to identify a health care proxy as his wife and plans  to fill out the paperwork required.  Patient was offered a separate [place] visit for further assistance with forms.  Pt states he does not want any CPR.

## 2016-12-07 NOTE — Assessment & Plan Note (Signed)
F/U with cardiology

## 2016-12-07 NOTE — Assessment & Plan Note (Signed)
Hgb A1C is 6.4 and stable.  Continue present meds

## 2016-12-07 NOTE — Assessment & Plan Note (Signed)
Seeing Orthopedics.  Doing well with cortisone shots.  Encouraged to not get surgery as cortisone working well

## 2016-12-07 NOTE — Assessment & Plan Note (Signed)
Stable, continue present medications.   

## 2016-12-07 NOTE — Progress Notes (Signed)
BP 121/66 (BP Location: Left Arm, Cuff Size: Normal)   Pulse 80   Temp 97.9 F (36.6 C)   Ht 5\' 11"  (1.803 m)   Wt 202 lb (91.6 kg)   SpO2 95%   BMI 28.17 kg/m    Subjective:    Patient ID: John [name], male    DOB: 1940-10-07, 76 y.o.   MRN: [id number]  HPI: John [name] is a 76 y.o. male  Chief Complaint  Patient presents with  . Annual Exam    pt had wellness exam 11/03/16 with [facility]    Diabetes: Using medications without difficulties No hypoglycemic episodes No hyperglycemic episodes Feet problems: none Blood Sugars averaging:Not checking eye exam within last year Last Hgb A1C: 6.9  Hypertension  Using medications without difficulty Average home BPs Not checking   Using medication without problems or lightheadedness No chest pain with exertion or shortness of breath No Edema  Elevated Cholesterol Using medications without problems No Muscle aches  Diet: Exercise: Works out on exercise bike 15 minutes every other day.    Fatigue Lack of energy.  Worries his Testosterone level is so low and has no energy.  Spending a lot of time in the [place].  Unable to go outside in the heat and work.    Heart [facility] Echo every 6 months  Relevant past medical, surgical, family and social history reviewed and updated as indicated. Interim medical history since our last visit reviewed. Allergies and medications reviewed and updated.  Review of Systems  Constitutional: Negative.   HENT: Negative.   Eyes: Negative.   Respiratory: Negative.   Cardiovascular: Negative.   Gastrointestinal: Negative.   Endocrine: Negative.   Genitourinary: Negative.   Musculoskeletal:       Right shoulder OA.  Went to [employer] and got a cortisone shot which helped.    Skin: Negative.   Allergic/Immunologic: Negative.   Neurological: Negative.   Hematological: Negative.   Psychiatric/Behavioral: Negative.     Per HPI unless specifically indicated above     Objective:    BP  121/66 (BP Location: Left Arm, Cuff Size: Normal)   Pulse 80   Temp 97.9 F (36.6 C)   Ht 5\' 11"  (1.803 m)   Wt 202 lb (91.6 kg)   SpO2 95%   BMI 28.17 kg/m   Wt Readings from Last 3 Encounters:  12/07/16 202 lb (91.6 kg)  11/03/16 201 lb 9.6 oz (91.4 kg)  05/24/16 203 lb 14.4 oz (92.5 kg)    Physical Exam  Constitutional: He is oriented to person, place, and time. He appears well-developed and well-nourished. No distress.  HENT:  Head: Normocephalic and atraumatic.  Eyes: Conjunctivae and lids are normal. Right eye exhibits no discharge. Left eye exhibits no discharge. No scleral icterus.  Neck: Normal range of motion. Neck supple. No JVD present. Carotid bruit is not present.  Cardiovascular: Normal rate and regular rhythm.   Murmur heard.  Systolic murmur is present with a grade of 4/6  Pulmonary/Chest: Effort normal and breath sounds normal. No respiratory distress.  Abdominal: Normal appearance. There is no splenomegaly or hepatomegaly.  Musculoskeletal: Normal range of motion.  Neurological: He is alert and oriented to person, place, and time.  Skin: Skin is warm, dry and intact. No rash noted. No pallor.  Psychiatric: He has a normal mood and affect. His behavior is normal. Judgment and thought content normal.    Results for orders placed or performed in visit on 07/09/16  HM DIABETES EYE  EXAM  Result Value Ref Range   HM Diabetic Eye Exam No Retinopathy No Retinopathy      Assessment & Plan:   Problem List Items Addressed This Visit      Unprioritized   Advance care planning    A voluntary discussion about advance care planning including the explanation and discussion of advance directives was extensively discussed  with the patient.  Explanation about the health care proxy and Living will was reviewed and packet with forms with explanation of how to fill them out was given.  During this discussion, the patient was able to identify a health care proxy as his wife  and plans  to fill out the paperwork required.  Patient was offered a separate [facility] visit for further assistance with forms.  Pt states he does not want any CPR.         Aortic valve stenosis, moderate    F/U with cardiology      DNR (do not resuscitate)    Pt clear not wanting resuscitation.  DNR form filled out.        Hyperlipidemia - Primary    Stable, continue present medications.        Relevant Orders   Lipid Panel w/o Chol/HDL Ratio   Hypertension    Not to goal today but usually better.  Also sees cardiology      Relevant Orders   Comprehensive metabolic panel   Localized osteoarthritis of right shoulder    Seeing Orthopedics.  Doing well with cortisone shots.  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  Continue present meds      Relevant Orders   Comprehensive metabolic panel   Bayer DCA Hb A1c Waived    Other Visit Diagnoses    Fatigue, unspecified type       Relevant Orders   TSH   CBC with Differential/Platelet   Testosterone, free, total       Follow up plan: Return in about 6 months (around 06/07/2017).

## 2016-12-08 LAB — CBC WITH DIFFERENTIAL/PLATELET
BASOS ABS: 0 10*3/uL (ref 0.0–0.2)
Basos: 0 %
EOS (ABSOLUTE): 0.3 10*3/uL (ref 0.0–0.4)
Eos: 5 %
Hematocrit: 35.4 % — ABNORMAL LOW (ref 37.5–51.0)
Hemoglobin: 11 g/dL — ABNORMAL LOW (ref 13.0–17.7)
Immature Grans (Abs): 0 10*3/uL (ref 0.0–0.1)
Immature Granulocytes: 0 %
LYMPHS ABS: 1.5 10*3/uL (ref 0.7–3.1)
Lymphs: 31 %
MCH: 24.9 pg — AB (ref 26.6–33.0)
MCHC: 31.1 g/dL — AB (ref 31.5–35.7)
MCV: 80 fL (ref 79–97)
MONOCYTES: 11 %
MONOS ABS: 0.5 10*3/uL (ref 0.1–0.9)
NEUTROS ABS: 2.5 10*3/uL (ref 1.4–7.0)
Neutrophils: 53 %
PLATELETS: 238 10*3/uL (ref 150–379)
RBC: 4.42 x10E6/uL (ref 4.14–5.80)
RDW: 15.1 % (ref 12.3–15.4)
WBC: 4.8 10*3/uL (ref 3.4–10.8)

## 2016-12-08 LAB — COMPREHENSIVE METABOLIC PANEL
A/G RATIO: 1.6 (ref 1.2–2.2)
ALT: 22 IU/L (ref 0–44)
AST: 26 IU/L (ref 0–40)
Albumin: 4.2 g/dL (ref 3.5–4.8)
Alkaline Phosphatase: 70 IU/L (ref 39–117)
BUN/Creatinine Ratio: 14 (ref 10–24)
BUN: 14 mg/dL (ref 8–27)
Bilirubin Total: 0.4 mg/dL (ref 0.0–1.2)
CO2: 28 mmol/L (ref 20–29)
CREATININE: 1.02 mg/dL (ref 0.76–1.27)
Calcium: 9.4 mg/dL (ref 8.6–10.2)
Chloride: 100 mmol/L (ref 96–106)
GFR, EST AFRICAN AMERICAN: 81 mL/min/{1.73_m2} (ref >59)
GFR, EST NON AFRICAN AMERICAN: 70 mL/min/{1.73_m2} (ref >59)
Globulin, Total: 2.6 g/dL (ref 1.5–4.5)
Glucose: 125 mg/dL — ABNORMAL HIGH (ref 65–99)
POTASSIUM: 3.8 mmol/L (ref 3.5–5.2)
Sodium: 144 mmol/L (ref 134–144)
TOTAL PROTEIN: 6.8 g/dL (ref 6.0–8.5)

## 2016-12-08 LAB — LIPID PANEL W/O CHOL/HDL RATIO
Cholesterol, Total: 181 mg/dL (ref 100–199)
HDL: 48 mg/dL (ref >39)
LDL CALC: 109 mg/dL — AB (ref 0–99)
Triglycerides: 122 mg/dL (ref 0–149)
VLDL CHOLESTEROL CAL: 24 mg/dL (ref 5–40)

## 2016-12-08 LAB — TSH: TSH: 5.67 u[IU]/mL — AB (ref 0.450–4.500)

## 2016-12-09 LAB — TESTOSTERONE, FREE, TOTAL, SHBG

## 2016-12-12 LAB — TESTOSTERONE, FREE, TOTAL, SHBG
Sex Hormone Binding: 81.6 nmol/L — ABNORMAL HIGH (ref 19.3–76.4)
TESTOSTERONE FREE: 2.7 pg/mL — AB (ref 6.6–18.1)
TESTOSTERONE: 292 ng/dL (ref 264–916)

## 2016-12-12 LAB — SPECIMEN STATUS REPORT

## 2016-12-13 ENCOUNTER — Other Ambulatory Visit: Payer: Self-pay | Admitting: Unknown Physician Specialty

## 2016-12-13 DIAGNOSIS — D649 Anemia, unspecified: Secondary | ICD-10-CM

## 2016-12-13 NOTE — Progress Notes (Unsigned)
A hemocult will tell us what we need.  Ordered.  Will he do that?

## 2016-12-14 DIAGNOSIS — E785 Hyperlipidemia, unspecified: Secondary | ICD-10-CM | POA: Diagnosis not present

## 2016-12-14 DIAGNOSIS — I350 Nonrheumatic aortic (valve) stenosis: Secondary | ICD-10-CM | POA: Diagnosis not present

## 2016-12-14 DIAGNOSIS — I10 Essential (primary) hypertension: Secondary | ICD-10-CM | POA: Diagnosis not present

## 2016-12-14 DIAGNOSIS — E119 Type 2 diabetes mellitus without complications: Secondary | ICD-10-CM | POA: Diagnosis not present

## 2016-12-14 NOTE — Progress Notes (Signed)
Called and spoke to patient. He is going to do the stool cards. Placed up front for patient pick up.

## 2016-12-21 ENCOUNTER — Ambulatory Visit: Payer: PPO

## 2016-12-21 DIAGNOSIS — D649 Anemia, unspecified: Secondary | ICD-10-CM

## 2016-12-21 LAB — FECAL OCCULT BLOOD, GUAIAC
Specimen 1: NEGATIVE
Specimen 2: NEGATIVE
Specimen 3: NEGATIVE

## 2016-12-23 ENCOUNTER — Inpatient Hospital Stay: Admit: 2016-12-23 | Payer: PPO | Admitting: Orthopedic Surgery

## 2016-12-23 SURGERY — ARTHROPLASTY, SHOULDER, TOTAL, REVERSE
Anesthesia: General | Site: Shoulder | Laterality: Right

## 2016-12-28 DIAGNOSIS — I350 Nonrheumatic aortic (valve) stenosis: Secondary | ICD-10-CM | POA: Diagnosis not present

## 2017-01-04 ENCOUNTER — Other Ambulatory Visit: Payer: Self-pay | Admitting: Unknown Physician Specialty

## 2017-01-19 DIAGNOSIS — Z96651 Presence of right artificial knee joint: Secondary | ICD-10-CM | POA: Diagnosis not present

## 2017-01-19 DIAGNOSIS — G8929 Other chronic pain: Secondary | ICD-10-CM | POA: Diagnosis not present

## 2017-01-19 DIAGNOSIS — Z471 Aftercare following joint replacement surgery: Secondary | ICD-10-CM | POA: Diagnosis not present

## 2017-01-19 DIAGNOSIS — M25561 Pain in right knee: Secondary | ICD-10-CM | POA: Diagnosis not present

## 2017-01-19 DIAGNOSIS — M1711 Unilateral primary osteoarthritis, right knee: Secondary | ICD-10-CM | POA: Diagnosis not present

## 2017-02-09 DIAGNOSIS — M75101 Unspecified rotator cuff tear or rupture of right shoulder, not specified as traumatic: Secondary | ICD-10-CM | POA: Diagnosis not present

## 2017-02-09 DIAGNOSIS — M12811 Other specific arthropathies, not elsewhere classified, right shoulder: Secondary | ICD-10-CM | POA: Diagnosis not present

## 2017-03-07 DIAGNOSIS — C679 Malignant neoplasm of bladder, unspecified: Secondary | ICD-10-CM | POA: Diagnosis not present

## 2017-03-07 DIAGNOSIS — Z8551 Personal history of malignant neoplasm of bladder: Secondary | ICD-10-CM | POA: Diagnosis not present

## 2017-03-07 DIAGNOSIS — R9341 Abnormal radiologic findings on diagnostic imaging of renal pelvis, ureter, or bladder: Secondary | ICD-10-CM | POA: Diagnosis not present

## 2017-04-01 ENCOUNTER — Telehealth: Payer: Self-pay

## 2017-04-01 MED ORDER — LOSARTAN POTASSIUM 50 MG PO TABS: 50.0000 mg | ORAL_TABLET | Freq: Every day | ORAL | 3 refills | Status: DC

## 2017-04-01 MED ORDER — HYDROCHLOROTHIAZIDE 12.5 MG PO CAPS: 12.5000 mg | ORAL_CAPSULE | Freq: Every day | ORAL | 1 refills | Status: DC

## 2017-04-01 NOTE — Telephone Encounter (Signed)
Pharmacy sent a fax stating that the patient's RX for losartan/hctz is on backorder. Want to know if we can split the medication and send in prescriptions for them.

## 2017-04-06 ENCOUNTER — Ambulatory Visit (INDEPENDENT_AMBULATORY_CARE_PROVIDER_SITE_OTHER): Payer: PPO | Admitting: Family Medicine

## 2017-04-06 ENCOUNTER — Ambulatory Visit: Payer: Self-pay | Admitting: *Deleted

## 2017-04-06 ENCOUNTER — Encounter: Payer: Self-pay | Admitting: Family Medicine

## 2017-04-06 VITALS — BP 134/70 | HR 83 | Wt 205.0 lb

## 2017-04-06 DIAGNOSIS — I10 Essential (primary) hypertension: Secondary | ICD-10-CM

## 2017-04-06 DIAGNOSIS — I350 Nonrheumatic aortic (valve) stenosis: Secondary | ICD-10-CM | POA: Diagnosis not present

## 2017-04-06 DIAGNOSIS — R079 Chest pain, unspecified: Secondary | ICD-10-CM | POA: Diagnosis not present

## 2017-04-06 NOTE — Telephone Encounter (Signed)
  Reason for Disposition . [1] Chest pain lasts > 5 minutes AND [2] occurred > 3 days ago (72 hours) AND [3] NO chest pain or cardiac symptoms now  Answer Assessment - Initial Assessment Questions 1. LOCATION: "Where does it hurt?"        Chest   -    Center of  Chest    2. RADIATION: "Does the pain go anywhere else?" (e.g., into neck, jaw, arms, back)        No   3. ONSET: "When did the chest pain begin?" (Minutes, hours or days)           3-4   Months  Ago    4. PATTERN "Does the pain come and go, or has it been constant since it started?"  "Does it get worse with exertion?"             Chest  Pain  On  Exertion    Shortness    Of  Breath on  Exertion   5. DURATION: "How long does it last" (e.g., seconds, minutes, hours)              paain last  sev minutes  And   Goes   Away    6. SEVERITY: "How bad is the pain?"  (e.g., Scale 1-10; mild, moderate, or severe)    - MILD (1-3): doesn't interfere with normal activities     - MODERATE (4-7): interferes with normal activities or awakens from sleep    - SEVERE (8-10): excruciating pain, unable to do any normal activities          Denies  Any  Chest pain today  Had  Some  Yesterday  On exertion     7. CARDIAC RISK FACTORS: "Do you have any history of heart problems or risk factors for heart disease?" (e.g., prior heart attack, angina; high blood pressure, diabetes, being overweight, high cholesterol, smoking, or strong family history of heart disease)         High  Blood pressure    Diabetic     Family  History of heart  Disease   8. PULMONARY RISK FACTORS: "Do you have any history of lung disease?"  (e.g., blood clots in lung, asthma, emphysema, birth control pills)           No    9. CAUSE: "What do you think is causing the chest pain?"            Exertion    10. OTHER SYMPTOMS: "Do you have any other symptoms?" (e.g., dizziness, nausea, vomiting, sweating, fever, difficulty breathing, cough)              NO  11. PREGNANCY: "Is there any  chance you are pregnant?" "When was your last menstrual period?"         N/A  Protocols used: CHEST PAIN-A-AH

## 2017-04-06 NOTE — Assessment & Plan Note (Signed)
Chest pain most likely angina discussed will get appointment with cardiology later this week or early next week if possible. Reviewed 911 Continue current medications otherwise.  Discussed modification of activities to avoid symptoms.

## 2017-04-06 NOTE — Progress Notes (Signed)
   BP 134/70   Pulse 83   Wt 205 lb (93 kg)   SpO2 99%   BMI 28.59 kg/m    Subjective:    Patient ID: John [name], male    DOB: Feb 12, 1941, 77 y.o.   MRN: [id number]  HPI: John [name] is a 77 y.o. male  Chest pain Patient with chest pain coming on with moderate exertion such as pulling the garbage can into the street stops it goes away without any further issues no problems with chest pain with grocery shopping routine house activities are walking through [place] for example. Patient followed by cardiology for aortic stenosis which is moderate.  Patient with no increasing shortness of breath PND orthopnea. Reviewed cardiology notes and EKGs. Patient's EKG showing new left bundle branch block Cardiac echo with 55% ejection fraction  Relevant past medical, surgical, family and social history reviewed and updated as indicated. Interim medical history since our last visit reviewed. Allergies and medications reviewed and updated.  Review of Systems  Constitutional: Negative.   Respiratory: Negative.  Negative for cough and shortness of breath.   Cardiovascular: Positive for chest pain. Negative for palpitations and leg swelling.    Per HPI unless specifically indicated above     Objective:    BP 134/70   Pulse 83   Wt 205 lb (93 kg)   SpO2 99%   BMI 28.59 kg/m   Wt Readings from Last 3 Encounters:  04/06/17 205 lb (93 kg)  12/07/16 202 lb (91.6 kg)  11/03/16 201 lb 9.6 oz (91.4 kg)    Physical Exam  Constitutional: He is oriented to person, place, and time. He appears well-developed and well-nourished.  HENT:  Head: Normocephalic and atraumatic.  Eyes: Conjunctivae and EOM are normal.  Neck: Normal range of motion.  Cardiovascular: Normal rate, regular rhythm and normal heart sounds.  Pulmonary/Chest: Effort normal and breath sounds normal.  Musculoskeletal: Normal range of motion.  Neurological: He is alert and oriented to person, place, and time.  Skin: No  erythema.  Psychiatric: He has a normal mood and affect. His behavior is normal. Judgment and thought content normal.    Results for orders placed or performed in visit on 12/21/16  [facility] Stool Card-TAKE HOME  Result Value Ref Range   Specimen 1 Negative    Specimen 2 Negative    Specimen 3 Negative       Assessment & Plan:   Problem List Items Addressed This Visit    None    Visit Diagnoses    Chest pain, unspecified type    -  Primary   Relevant Orders   EKG 12-Lead (Completed)       Follow up plan: No Follow-up on file.

## 2017-04-06 NOTE — Telephone Encounter (Signed)
Pt   Reports  intermittent  Chest  Pain  For  Several  months    No  Pain  At this  Time   He  Reports   Pain and  Shortness  Of  Breath   On  Exertion    Had  Some pain  Yesterday   No  Pain  At this  Time     He  Is   Awake .    Appointment  Made  Today  With  Dr  Jeananne Rama   At  [facility]  .  Pt  Verbalizes  Knowledge  Of plan of  Care

## 2017-04-06 NOTE — Assessment & Plan Note (Signed)
The current medical regimen is effective;  continue present plan and medications.  

## 2017-04-06 NOTE — Assessment & Plan Note (Signed)
Stable on review

## 2017-04-11 DIAGNOSIS — Z01818 Encounter for other preprocedural examination: Secondary | ICD-10-CM | POA: Diagnosis not present

## 2017-04-11 DIAGNOSIS — E785 Hyperlipidemia, unspecified: Secondary | ICD-10-CM | POA: Diagnosis not present

## 2017-04-11 DIAGNOSIS — E119 Type 2 diabetes mellitus without complications: Secondary | ICD-10-CM | POA: Diagnosis not present

## 2017-04-11 DIAGNOSIS — I350 Nonrheumatic aortic (valve) stenosis: Secondary | ICD-10-CM | POA: Diagnosis not present

## 2017-04-11 DIAGNOSIS — R011 Cardiac murmur, unspecified: Secondary | ICD-10-CM | POA: Diagnosis not present

## 2017-04-11 DIAGNOSIS — I10 Essential (primary) hypertension: Secondary | ICD-10-CM | POA: Diagnosis not present

## 2017-04-11 DIAGNOSIS — R079 Chest pain, unspecified: Secondary | ICD-10-CM | POA: Diagnosis not present

## 2017-04-11 DIAGNOSIS — Z01810 Encounter for preprocedural cardiovascular examination: Secondary | ICD-10-CM | POA: Diagnosis not present

## 2017-04-21 ENCOUNTER — Encounter
Admission: RE | Disposition: A | Discharge status: Home / Self Care | Payer: Self-pay | Source: Ambulatory Visit | Attending: Cardiology

## 2017-04-21 ENCOUNTER — Ambulatory Visit
Admission: RE | Admit: 2017-04-21 | Discharge: 2017-04-21 | Disposition: A | Discharge status: Home / Self Care | Payer: PPO | Source: Ambulatory Visit | Attending: Cardiology | Admitting: Cardiology

## 2017-04-21 DIAGNOSIS — I272 Pulmonary hypertension, unspecified: Secondary | ICD-10-CM | POA: Diagnosis not present

## 2017-04-21 DIAGNOSIS — R079 Chest pain, unspecified: Secondary | ICD-10-CM | POA: Diagnosis not present

## 2017-04-21 DIAGNOSIS — I2510 Atherosclerotic heart disease of native coronary artery without angina pectoris: Secondary | ICD-10-CM | POA: Diagnosis not present

## 2017-04-21 DIAGNOSIS — I350 Nonrheumatic aortic (valve) stenosis: Secondary | ICD-10-CM | POA: Diagnosis not present

## 2017-04-21 DIAGNOSIS — I25119 Atherosclerotic heart disease of native coronary artery with unspecified angina pectoris: Secondary | ICD-10-CM | POA: Diagnosis not present

## 2017-04-21 HISTORY — DX: Atherosclerotic heart disease of native coronary artery without angina pectoris: I25.10

## 2017-04-21 HISTORY — PX: RIGHT/LEFT HEART CATH AND CORONARY ANGIOGRAPHY: CATH118266

## 2017-04-21 LAB — GLUCOSE, CAPILLARY: GLUCOSE-CAPILLARY: 143 mg/dL — AB (ref 65–99)

## 2017-04-21 SURGERY — RIGHT/LEFT HEART CATH AND CORONARY ANGIOGRAPHY
Anesthesia: Moderate Sedation

## 2017-04-21 MED ORDER — SODIUM CHLORIDE 0.9% FLUSH: 3.0000 mL | INTRAVENOUS | Status: DC | PRN

## 2017-04-21 MED ORDER — SODIUM CHLORIDE 0.9 % IV SOLN: 250.0000 mL | INTRAVENOUS | Status: DC | PRN

## 2017-04-21 MED ORDER — ONDANSETRON HCL 4 MG/2ML IJ SOLN: 4.0000 mg | Freq: Four times a day (QID) | INTRAMUSCULAR | Status: DC | PRN

## 2017-04-21 MED ORDER — SODIUM CHLORIDE 0.9 % WEIGHT BASED INFUSION: 1.0000 mL/kg/h | INTRAVENOUS | Status: DC

## 2017-04-21 MED ORDER — ASPIRIN 81 MG PO CHEW: 81.0000 mg | CHEWABLE_TABLET | ORAL | Status: DC

## 2017-04-21 MED ORDER — FENTANYL CITRATE (PF) 100 MCG/2ML IJ SOLN
INTRAMUSCULAR | Status: DC | PRN
  Administered 2017-04-21 (×2): 25 ug via INTRAVENOUS

## 2017-04-21 MED ORDER — FENTANYL CITRATE (PF) 100 MCG/2ML IJ SOLN
INTRAMUSCULAR | Status: AC
  Filled 2017-04-21: qty 2

## 2017-04-21 MED ORDER — ACETAMINOPHEN 325 MG PO TABS: 650.0000 mg | ORAL_TABLET | ORAL | Status: DC | PRN

## 2017-04-21 MED ORDER — SODIUM CHLORIDE 0.9 % WEIGHT BASED INFUSION
3.0000 mL/kg/h | INTRAVENOUS | Status: AC
  Administered 2017-04-21: 3 mL/kg/h via INTRAVENOUS

## 2017-04-21 MED ORDER — SODIUM CHLORIDE 0.9% FLUSH: 3.0000 mL | Freq: Two times a day (BID) | INTRAVENOUS | Status: DC

## 2017-04-21 MED ORDER — HEPARIN (PORCINE) IN NACL 2-0.9 UNIT/ML-% IJ SOLN
INTRAMUSCULAR | Status: AC
  Filled 2017-04-21: qty 500

## 2017-04-21 MED ORDER — MIDAZOLAM HCL 2 MG/2ML IJ SOLN
INTRAMUSCULAR | Status: DC | PRN
  Administered 2017-04-21: 1 mg via INTRAVENOUS

## 2017-04-21 MED ORDER — MIDAZOLAM HCL 2 MG/2ML IJ SOLN
INTRAMUSCULAR | Status: AC
  Filled 2017-04-21: qty 2

## 2017-04-21 SURGICAL SUPPLY — 15 items
CATH INFINITI 5FR ANG PIGTAIL (CATHETERS) IMPLANT
CATH INFINITI 5FR JL4 (CATHETERS) ×3 IMPLANT
CATH INFINITI JR4 5F (CATHETERS) ×3 IMPLANT
CATH LANGSTON DUAL LUM PIG 6FR (CATHETERS) ×3 IMPLANT
CATH SWANZ 7F THERMO (CATHETERS) ×3 IMPLANT
DEVICE CLOSURE MYNXGRIP 6/7F (Vascular Products) ×3 IMPLANT
KIT MANI 3VAL PERCEP (MISCELLANEOUS) ×3 IMPLANT
KIT RIGHT HEART (MISCELLANEOUS) ×3 IMPLANT
NEEDLE PERC 18GX7CM (NEEDLE) ×3 IMPLANT
PACK CARDIAC CATH (CUSTOM PROCEDURE TRAY) ×3 IMPLANT
SHEATH AVANTI 5FR X 11CM (SHEATH) ×3 IMPLANT
SHEATH AVANTI 6FR X 11CM (SHEATH) ×3 IMPLANT
SHEATH PINNACLE 7F 10CM (SHEATH) ×3 IMPLANT
WIRE EMERALD ST .035X150CM (WIRE) ×3 IMPLANT
WIRE GUIDERIGHT .035X150 (WIRE) ×3 IMPLANT

## 2017-04-27 ENCOUNTER — Encounter: Payer: Self-pay | Admitting: Surgery

## 2017-04-27 ENCOUNTER — Other Ambulatory Visit: Payer: Self-pay | Admitting: *Deleted

## 2017-04-27 ENCOUNTER — Institutional Professional Consult (permissible substitution): Payer: PPO | Admitting: Surgery

## 2017-04-27 VITALS — BP 139/77 | HR 78 | Resp 20 | Ht 71.0 in | Wt 205.0 lb

## 2017-04-27 DIAGNOSIS — I2510 Atherosclerotic heart disease of native coronary artery without angina pectoris: Secondary | ICD-10-CM

## 2017-04-27 DIAGNOSIS — I350 Nonrheumatic aortic (valve) stenosis: Secondary | ICD-10-CM

## 2017-04-27 NOTE — Progress Notes (Signed)
Cardiothoracic Surgery Consultation   PCP is Kathrine Haddock, NP Referring Provider is Isaias Cowman, MD  Chief Complaint  Patient presents with  . Aortic Stenosis    Surgical eval, Cardiac Cath 04/21/17, ECHO 12/28/2016  . Coronary Artery Disease    HPI:  The patient is a 77 year old active gentleman with history of diabetes, hyperlipidemia, hypertension, and aortic stenosis that has been followed by Dr.Paraschos.  His last echocardiogram on 12/28/2016 showed a mean aortic transvalvular gradient of 37 mmHg with a peak gradient of 63.4 mmHg.  Left ventricular ejection fraction was 55%.  There is mild tricuspid and mild mitral valve insufficiency.  He now presents with a 85-month history of exertional chest discomfort and shortness of breath which has been occurring with activities such as walking up a hill or up the steps.  He has not had these symptoms with normal daily activities around the house or walking on level ground.  He has had no symptoms at rest.  He has had no dizziness or syncope.  He denies any orthopnea or PND.  He has had no peripheral edema.  He underwent cardiac catheterization on 04/21/2017 which showed severe three-vessel coronary disease with 70% proximal LAD stenosis, 80% ostial left circumflex stenosis, 90% ostial ramus stenosis, and 75% proximal PDA stenosis.  Aortic valve could not be crossed.  There is mild pulmonary hypertension at 45/27.  Past Medical History:  Diagnosis Date  . Anemia   . Cancer ([place])    bladder  . Cataracts, bilateral   . Diabetes mellitus without complication ([place])    takes Janumet daily  . Dyspnea   . Heart murmur   . History of colon polyps    benign  . Hyperlipidemia    takes Lovastatin daily  . Hypertension    takes Hyzaar and Amlodipine daily  . Joint pain   . Muscle cramps    occasionally in legs   . Osteoarthritis   . Urinary urgency     Past Surgical History:  Procedure Laterality Date  . BLADDER SURGERY    .  COLONOSCOPY    . CYSTOURETHROSCOPY    . JOINT REPLACEMENT Left    hip  . KNEE ARTHROPLASTY Right 01/12/2016   Procedure: RIGHT  TOTAL KNEE ARTHROPLASTY WITH COMPUTER NAVIGATION;  Surgeon: Rod Can, MD;  Location: [facility];  Service: Orthopedics;  Laterality: Right;  Needs RNFA  . RIGHT/LEFT HEART CATH AND CORONARY ANGIOGRAPHY N/A 04/21/2017   Procedure: RIGHT/LEFT HEART CATH AND CORONARY ANGIOGRAPHY;  Surgeon: Isaias Cowman, MD;  Location: [facility];  Service: Cardiovascular;  Laterality: N/A;  . SKIN SURGERY Left    arm    Family History  Problem Relation Age of Onset  . Dementia Mother   . Stroke Father   . Diabetes Father   . Heart disease Father   . Cancer Sister   . Hypertension Brother   . Cancer Sister     Social History Social History   Tobacco Use  . Smoking status: Former Smoker    Last attempt to quit: 12/15/1980    Years since quitting: 36.3  . Smokeless tobacco: Never Used  Substance Use Topics  . Alcohol use: No    Alcohol/week: 0.0 oz  . Drug use: No    Current Outpatient Medications  Medication Sig Dispense Refill  . amLODipine (NORVASC) 5 MG tablet TAKE ONE TABLET BY MOUTH ONCE DAILY 90 tablet 3  . Cholecalciferol (VITAMIN D) 2000 units tablet Take 2,000 Units by  mouth daily.    . Cyanocobalamin (RA VITAMIN B-12 TR) 1000 MCG TBCR Take 1,000 mcg by mouth daily.     . hydrochlorothiazide (MICROZIDE) 12.5 MG capsule Take 1 capsule (12.5 mg total) by mouth daily. 90 capsule 1  . losartan (COZAAR) 50 MG tablet Take 1 tablet (50 mg total) by mouth daily. 90 tablet 3  . lovastatin (MEVACOR) 40 MG tablet Take 1 tablet (40 mg total) by mouth daily. 90 tablet 3  . naproxen sodium (ALEVE) 220 MG tablet Take 440 mg by mouth daily as needed (pain).    . SitaGLIPtin-MetFORMIN HCl (JANUMET XR) 50-1000 MG TB24 Take 1 tablet by mouth 2 (two) times daily. 180 tablet 3   No current facility-administered medications for this visit.     Allergies    Allergen Reactions  . Aspirin Other (See Comments)    bruising    Review of Systems  Constitutional: Positive for activity change and fatigue.  HENT: Negative.        Does not see his dentist regularly. Has upper dentures and only two teeth on bottom.  Eyes: Negative.   Respiratory: Positive for chest tightness and shortness of breath.   Cardiovascular: Positive for chest pain. Negative for leg swelling.  Gastrointestinal: Negative.   Endocrine: Negative.   Genitourinary: Negative.   Musculoskeletal: Positive for arthralgias, gait problem, joint swelling and myalgias.       Leg cramps  Skin: Negative.   Allergic/Immunologic: Negative.   Neurological: Negative for dizziness and syncope.  Hematological: Bruises/bleeds easily.  Psychiatric/Behavioral: Negative.     BP 139/77   Pulse 78   Resp 20   Ht 5\' 11"  (1.803 m)   Wt 205 lb (93 kg)   SpO2 97% Comment: RA  BMI 28.59 kg/m  Physical Exam  Constitutional: He is oriented to person, place, and time. He appears well-developed and well-nourished. No distress.  HENT:  Head: Normocephalic and atraumatic.  Mouth/Throat: Oropharynx is clear and moist.  Upper dentures. Two lower incisors appear to be in good condition with crowns in place. Good oral hygiene.  Eyes: Conjunctivae and EOM are normal. Pupils are equal, round, and reactive to light.  Neck: Normal range of motion. Neck supple. No JVD present. No tracheal deviation present. No thyromegaly present.  Cardiovascular: Normal rate and regular rhythm.  Murmur heard. 3/6 systolic murmur RSB  Pulmonary/Chest: Effort normal and breath sounds normal. No respiratory distress. He has no rales.  Abdominal: Soft. Bowel sounds are normal. He exhibits no distension and no mass. There is no tenderness.  Musculoskeletal: Normal range of motion. He exhibits no edema or tenderness.  Lymphadenopathy:    He has no cervical adenopathy.  Neurological: He is alert and oriented to person,  place, and time. He has normal strength. No cranial nerve deficit or sensory deficit.  Skin: Skin is warm and dry.  Psychiatric: He has a normal mood and affect.     Diagnostic Tests:  Physicians   Panel Physicians Referring Physician Case Authorizing Physician  Paraschos, Alexander, MD (Primary)    Procedures   RIGHT/LEFT HEART CATH AND CORONARY ANGIOGRAPHY  Conclusion     Ost Ramus lesion is 90% stenosed.  Ost Cx to Prox Cx lesion is 80% stenosed.  Ost LAD to Prox LAD lesion is 70% stenosed.  Prox LAD lesion is 60% stenosed.  Prox RCA lesion is 40% stenosed.  Mid RCA to Dist RCA lesion is 40% stenosed.  Ost RPDA to RPDA lesion is 75% stenosed.  1.  Three-vessel coronary artery disease with 70% stenosis proximal LAD, 80% stenosis ostial left circumflex, 90% stenosis ostial ramus intermedius branch, and 75% stenosis proximal PDA 2.  Unable to cross calcific aortic valve with straight wire 3.  Severe aortic stenosis with calculated aortic valve area 1.0 cm by 2D echocardiogram 4.  Mild pulmonary hypertension with mean PA of 34 mmHg  Recommendations  1.  CABG with AVR   Indications   Chest pain, unspecified type [R07.9 (ICD-10-CM)]  Procedural Details/Technique   Technical Details The right groin was prepped and draped in usual sterile manner. A 7 French sheath was introduced in the right femoral vein a 6 French sheath was introduced right femoral artery. Right-sided pressures were obtained with a pulmonary arterial catheter. Coronary angiography was performed with standard JL4 and JR4 diagnostic catheters. I was unable to cross the aortic valve with a straight wire. The right femoral artery was closed with a minx closure device. The patient received 1 mg of Versed and 50 mcg of fentanyl. Total contrast was 100 cc. There were no periprocedural complications.   Estimated blood loss <50 mL.  During this procedure the patient was administered the following to  achieve and maintain moderate conscious sedation: Versed mg, while the patient's heart rate, blood pressure, and oxygen saturation were continuously monitored.  Coronary Findings   Diagnostic  Dominance: Right  Left Anterior Descending  Ost LAD to Prox LAD lesion 70% stenosed  Ost LAD to Prox LAD lesion is 70% stenosed. The lesion is tubular.  Prox LAD lesion 60% stenosed  Prox LAD lesion is 60% stenosed. The lesion is tubular.  Ramus Intermedius  Ost Ramus lesion 90% stenosed  Ost Ramus lesion is 90% stenosed.  Left Circumflex  Ost Cx to Prox Cx lesion 80% stenosed  Ost Cx to Prox Cx lesion is 80% stenosed.  Right Coronary Artery  Prox RCA lesion 40% stenosed  Prox RCA lesion is 40% stenosed. The lesion is discrete.  Mid RCA to Dist RCA lesion 40% stenosed  Mid RCA to Dist RCA lesion is 40% stenosed. The lesion is tubular.  Right Posterior Descending Artery  Ost RPDA to RPDA lesion 75% stenosed  Ost RPDA to RPDA lesion is 75% stenosed. The lesion is tubular.  Intervention   No interventions have been documented.  Coronary Diagrams   Diagnostic Diagram       Implants     Vascular Products  Device Closure Mynxgrip 6/68f - [id number] - Implanted    Inventory item: Device Closure Mynxgrip 6/78f Model/Cat number: [id number]  Manufacturer: [employer] Lot number: [id number]  Device identifier: [id number] Device identifier type: GS1  Area Of Implantation: Groin    GUDID Information   Request status Successful    Brand name: [facility] Version/Model: [id number]  Company name: [place]. MRI safety info as of 04/21/17: Labeling does not contain MRI Safety Information  Contains dry or latex rubber: No    GMDN P.T. name: Wound hydrogel dressing, sterile    As of 04/21/2017   Status: Implanted      MERGE Images   Show images for CARDIAC CATHETERIZATION   Link to Procedure Log   Procedure Log    Hemo Data 04/21/17 0943--04/21/17 1042 before discharge   AO Systolic  Cath Pressure AO Diastolic Cath Pressure AO Mean Cath Pressure PA Systolic Cath Pressure PA Diastolic Cath Pressure PA Mean Cath Pressure RV Systolic Cath Pressure RV Diastolic Cath Pressure RV End Diastolic AO O2 Sat PA O2 Sat AO  O2 Sat TDCO  - - - 45 mmHg 27 mmHg 34 mmHg - - - - - - -  - - - - - - - - - - - - 6.82 L/min  - - - - - - 44 mmHg 16 mmHg 22 mmHg - - - -  123 57 mmHg 19 mmHg - - - - - - - - - -  127 63 mmHg 65 mmHg - - - - - - - - - -  - - - - - - - - - 97.3 % - SA -  - - - - - - - - - - PA - -    2D echo study from 12/28/16 at [facility] reviewed on disc.   Impression:  This 77 year old gentleman has stage D, severe, symptomatic aortic stenosis and severe multivessel coronary artery disease with New York Heart Association functional class II symptoms of exertional fatigue and shortness of breath consistent with chronic diastolic heart failure as well as stable angina.  I agree that coronary bypass graft surgery and aortic valve replacement is indicated in this patient.  I recommended using a bioprosthetic valve given his age of 18.  I reviewed the echocardiogram and catheterization findings with him and his family and answered all of their questions. I discussed the operative procedure with the patient and family including alternatives, benefits and risks; including but not limited to bleeding, blood transfusion, infection, stroke, myocardial infarction, graft failure, heart block requiring a permanent pacemaker, organ dysfunction, and death.  [name] understands and agrees to proceed.     Plan:  Coronary bypass graft surgery and aortic valve replacement use prosthetic valve on Thursday, May 05, 2017.   I spent 60 minutes performing this consultation and > 50% of this time was spent face to face counseling and coordinating the care of this patient's severe multivessel coronary artery disease and severe aortic stenosis.   Gaye Pollack, MD [facility] [phone]

## 2017-05-02 NOTE — Pre-Procedure Instructions (Signed)
John [name]  05/02/2017      [facility] [place], [place] - [place] [address] [place] [address] Phone: [phone] Fax: [phone]    Your procedure is scheduled on March 7  Report to [place] at [place].M.  Call this number if you have problems the morning of surgery:  [phone]   Remember:  Do not eat food or drink liquids after midnight.  Continue all medications as directed by your physician except follow these medication instructions before surgery below   Take these medicines the morning of surgery with A SIP OF WATER  amLODipine (NORVASC)  7 days prior to surgery STOP taking any Aspirin(unless otherwise instructed by your surgeon), Aleve, Naproxen, Ibuprofen, Motrin, Advil, Goody's, BC's, all herbal medications, fish oil, and all vitamins   WHAT DO I DO ABOUT MY DIABETES MEDICATION?   Marland Kitchen Do not take oral diabetes medicines (pills) the morning of surgery. SitaGLIPtin-MetFORMIN HCl (JANUMET XR)   How to Manage Your Diabetes Before and After Surgery  Why is it important to control my blood sugar before and after surgery? . Improving blood sugar levels before and after surgery helps healing and can limit problems. . A way of improving blood sugar control is eating a healthy diet by: o  Eating less sugar and carbohydrates o  Increasing activity/exercise o  Talking with your doctor about reaching your blood sugar goals . High blood sugars (greater than 180 mg/dL) can raise your risk of infections and slow your recovery, so you will need to focus on controlling your diabetes during the weeks before surgery. . Make sure that the doctor who takes care of your diabetes knows about your planned surgery including the date and location.  How do I manage my blood sugar before surgery? . Check your blood sugar at least 4 times a day, starting 2 days before surgery, to make sure that the level is not too high or  low. o Check your blood sugar the morning of your surgery when you wake up and every 2 hours until you get to the Short Stay unit. . If your blood sugar is less than 70 mg/dL, you will need to treat for low blood sugar: o Do not take insulin. o Treat a low blood sugar (less than 70 mg/dL) with  cup of clear juice (cranberry or apple), 4 glucose tablets, OR glucose gel. o Recheck blood sugar in 15 minutes after treatment (to make sure it is greater than 70 mg/dL). If your blood sugar is not greater than 70 mg/dL on recheck, call [phone] for further instructions. . Report your blood sugar to the short stay nurse when you get to Short Stay.  . If you are admitted to the hospital after surgery: o Your blood sugar will be checked by the staff and you will probably be given insulin after surgery (instead of oral diabetes medicines) to make sure you have good blood sugar levels. o The goal for blood sugar control after surgery is 80-180 mg/dL.    Do not wear jewelry  Do not wear lotions, powders, or cologne, or deodorant.  Men may shave face and neck.  Do not bring valuables to the hospital.  [facility] is not responsible for any belongings or valuables.  Contacts, dentures or bridgework may not be worn into surgery.  Leave your suitcase in the car.  After surgery it may be brought to your room.  For patients admitted to  the hospital, discharge time will be determined by your treatment team.  Patients discharged the day of surgery will not be allowed to drive home.    Special instructions:   [facility]- Preparing For Surgery  Before surgery, you can play an important role. Because skin is not sterile, your skin needs to be as free of germs as possible. You can reduce the number of germs on your skin by washing with CHG (chlorahexidine gluconate) Soap before surgery.  CHG is an antiseptic cleaner which kills germs and bonds with the skin to continue killing germs even after  washing.  Please do not use if you have an allergy to CHG or antibacterial soaps. If your skin becomes reddened/irritated stop using the CHG.  Do not shave (including legs and underarms) for at least 48 hours prior to first CHG shower. It is OK to shave your face.  Please follow these instructions carefully.   1. Shower the NIGHT BEFORE SURGERY and the MORNING OF SURGERY with CHG.   2. If you chose to wash your hair, wash your hair first as usual with your normal shampoo.  3. After you shampoo, rinse your hair and body thoroughly to remove the shampoo.  4. Use CHG as you would any other liquid soap. You can apply CHG directly to the skin and wash gently with a scrungie or a clean washcloth.   5. Apply the CHG Soap to your body ONLY FROM THE NECK DOWN.  Do not use on open wounds or open sores. Avoid contact with your eyes, ears, mouth and genitals (private parts). Wash Face and genitals (private parts)  with your normal soap.  6. Wash thoroughly, paying special attention to the area where your surgery will be performed.  7. Thoroughly rinse your body with warm water from the neck down.  8. DO NOT shower/wash with your normal soap after using and rinsing off the CHG Soap.  9. Pat yourself dry with a CLEAN TOWEL.  10. Wear CLEAN PAJAMAS to bed the night before surgery, wear comfortable clothes the morning of surgery  11. Place CLEAN SHEETS on your bed the night of your first shower and DO NOT SLEEP WITH PETS.    Day of Surgery: Do not apply any deodorants/lotions. Please wear clean clothes to the hospital/surgery center.      Please read over the following fact sheets that you were given.

## 2017-05-03 ENCOUNTER — Ambulatory Visit (HOSPITAL_COMMUNITY)
Admission: RE | Admit: 2017-05-03 | Discharge: 2017-05-03 | Disposition: A | Discharge status: Home / Self Care | Payer: PPO | Source: Ambulatory Visit | Attending: Surgery | Admitting: Surgery

## 2017-05-03 ENCOUNTER — Ambulatory Visit (HOSPITAL_BASED_OUTPATIENT_CLINIC_OR_DEPARTMENT_OTHER)
Admission: RE | Admit: 2017-05-03 | Discharge: 2017-05-03 | Disposition: A | Discharge status: Home / Self Care | Payer: PPO | Source: Ambulatory Visit | Attending: Surgery | Admitting: Surgery

## 2017-05-03 ENCOUNTER — Encounter (HOSPITAL_COMMUNITY): Payer: Self-pay

## 2017-05-03 ENCOUNTER — Other Ambulatory Visit: Payer: Self-pay

## 2017-05-03 DIAGNOSIS — I272 Pulmonary hypertension, unspecified: Secondary | ICD-10-CM | POA: Diagnosis not present

## 2017-05-03 DIAGNOSIS — I4891 Unspecified atrial fibrillation: Secondary | ICD-10-CM | POA: Diagnosis not present

## 2017-05-03 DIAGNOSIS — Z833 Family history of diabetes mellitus: Secondary | ICD-10-CM | POA: Diagnosis not present

## 2017-05-03 DIAGNOSIS — Z7984 Long term (current) use of oral hypoglycemic drugs: Secondary | ICD-10-CM | POA: Diagnosis not present

## 2017-05-03 DIAGNOSIS — Z01818 Encounter for other preprocedural examination: Secondary | ICD-10-CM

## 2017-05-03 DIAGNOSIS — D696 Thrombocytopenia, unspecified: Secondary | ICD-10-CM | POA: Diagnosis not present

## 2017-05-03 DIAGNOSIS — I350 Nonrheumatic aortic (valve) stenosis: Secondary | ICD-10-CM

## 2017-05-03 DIAGNOSIS — I2510 Atherosclerotic heart disease of native coronary artery without angina pectoris: Secondary | ICD-10-CM

## 2017-05-03 DIAGNOSIS — I6523 Occlusion and stenosis of bilateral carotid arteries: Secondary | ICD-10-CM | POA: Insufficient documentation

## 2017-05-03 DIAGNOSIS — I110 Hypertensive heart disease with heart failure: Secondary | ICD-10-CM | POA: Diagnosis not present

## 2017-05-03 DIAGNOSIS — Z8551 Personal history of malignant neoplasm of bladder: Secondary | ICD-10-CM | POA: Diagnosis not present

## 2017-05-03 DIAGNOSIS — I25119 Atherosclerotic heart disease of native coronary artery with unspecified angina pectoris: Secondary | ICD-10-CM | POA: Diagnosis not present

## 2017-05-03 DIAGNOSIS — Z886 Allergy status to analgesic agent status: Secondary | ICD-10-CM | POA: Diagnosis not present

## 2017-05-03 DIAGNOSIS — Z96642 Presence of left artificial hip joint: Secondary | ICD-10-CM | POA: Diagnosis not present

## 2017-05-03 DIAGNOSIS — Z96651 Presence of right artificial knee joint: Secondary | ICD-10-CM | POA: Diagnosis not present

## 2017-05-03 DIAGNOSIS — I10 Essential (primary) hypertension: Secondary | ICD-10-CM | POA: Insufficient documentation

## 2017-05-03 DIAGNOSIS — I080 Rheumatic disorders of both mitral and aortic valves: Secondary | ICD-10-CM | POA: Diagnosis not present

## 2017-05-03 DIAGNOSIS — Z86010 Personal history of colonic polyps: Secondary | ICD-10-CM | POA: Diagnosis not present

## 2017-05-03 DIAGNOSIS — E785 Hyperlipidemia, unspecified: Secondary | ICD-10-CM

## 2017-05-03 DIAGNOSIS — Z79899 Other long term (current) drug therapy: Secondary | ICD-10-CM | POA: Diagnosis not present

## 2017-05-03 DIAGNOSIS — I083 Combined rheumatic disorders of mitral, aortic and tricuspid valves: Secondary | ICD-10-CM | POA: Diagnosis not present

## 2017-05-03 DIAGNOSIS — I25810 Atherosclerosis of coronary artery bypass graft(s) without angina pectoris: Secondary | ICD-10-CM | POA: Diagnosis not present

## 2017-05-03 DIAGNOSIS — Z951 Presence of aortocoronary bypass graft: Secondary | ICD-10-CM | POA: Diagnosis not present

## 2017-05-03 DIAGNOSIS — I5032 Chronic diastolic (congestive) heart failure: Secondary | ICD-10-CM | POA: Diagnosis not present

## 2017-05-03 DIAGNOSIS — Z87891 Personal history of nicotine dependence: Secondary | ICD-10-CM | POA: Diagnosis not present

## 2017-05-03 DIAGNOSIS — I358 Other nonrheumatic aortic valve disorders: Secondary | ICD-10-CM | POA: Diagnosis not present

## 2017-05-03 DIAGNOSIS — Z8249 Family history of ischemic heart disease and other diseases of the circulatory system: Secondary | ICD-10-CM | POA: Diagnosis not present

## 2017-05-03 DIAGNOSIS — R918 Other nonspecific abnormal finding of lung field: Secondary | ICD-10-CM | POA: Diagnosis not present

## 2017-05-03 DIAGNOSIS — D62 Acute posthemorrhagic anemia: Secondary | ICD-10-CM | POA: Diagnosis not present

## 2017-05-03 DIAGNOSIS — J9811 Atelectasis: Secondary | ICD-10-CM | POA: Diagnosis not present

## 2017-05-03 DIAGNOSIS — E119 Type 2 diabetes mellitus without complications: Secondary | ICD-10-CM | POA: Diagnosis not present

## 2017-05-03 HISTORY — DX: Atherosclerotic heart disease of native coronary artery without angina pectoris: I25.10

## 2017-05-03 HISTORY — DX: Nonrheumatic aortic (valve) stenosis: I35.0

## 2017-05-03 LAB — PULMONARY FUNCTION TEST
DL/VA % pred: 79 %
DL/VA: 3.73 ml/min/mmHg/L
DLCO unc % pred: 53 %
DLCO unc: 18.83 ml/min/mmHg
FEF 25-75 POST: 1.99 L/s
FEF 25-75 PRE: 1.16 L/s
FEF2575-%Change-Post: 72 %
FEF2575-%Pred-Post: 90 %
FEF2575-%Pred-Pre: 52 %
FEV1-%Change-Post: 11 %
FEV1-%PRED-PRE: 59 %
FEV1-%Pred-Post: 66 %
FEV1-POST: 2.09 L
FEV1-Pre: 1.88 L
FEV1FVC-%Change-Post: 9 %
FEV1FVC-%PRED-PRE: 99 %
FEV6-%CHANGE-POST: 2 %
FEV6-%PRED-POST: 64 %
FEV6-%Pred-Pre: 62 %
FEV6-POST: 2.66 L
FEV6-Pre: 2.59 L
FEV6FVC-%CHANGE-POST: 1 %
FEV6FVC-%PRED-POST: 106 %
FEV6FVC-%Pred-Pre: 105 %
FVC-%Change-Post: 1 %
FVC-%Pred-Post: 60 %
FVC-%Pred-Pre: 59 %
FVC-Post: 2.66 L
FVC-Pre: 2.62 L
PRE FEV6/FVC RATIO: 99 %
Post FEV1/FVC ratio: 78 %
Post FEV6/FVC ratio: 100 %
Pre FEV1/FVC ratio: 72 %
RV % PRED: 104 %
RV: 2.88 L
TLC % PRED: 79 %
TLC: 5.92 L

## 2017-05-03 LAB — SURGICAL PCR SCREEN
MRSA, PCR: NEGATIVE
STAPHYLOCOCCUS AUREUS: NEGATIVE

## 2017-05-03 LAB — HEMOGLOBIN A1C
Hgb A1c MFr Bld: 6.7 % — ABNORMAL HIGH (ref 4.8–5.6)
MEAN PLASMA GLUCOSE: 145.59 mg/dL

## 2017-05-03 LAB — COMPREHENSIVE METABOLIC PANEL
ALK PHOS: 69 U/L (ref 38–126)
ALT: 28 U/L (ref 17–63)
ANION GAP: 14 (ref 5–15)
AST: 31 U/L (ref 15–41)
Albumin: 4 g/dL (ref 3.5–5.0)
BUN: 24 mg/dL — ABNORMAL HIGH (ref 6–20)
CALCIUM: 9 mg/dL (ref 8.9–10.3)
CO2: 25 mmol/L (ref 22–32)
CREATININE: 0.92 mg/dL (ref 0.61–1.24)
Chloride: 101 mmol/L (ref 101–111)
Glucose, Bld: 146 mg/dL — ABNORMAL HIGH (ref 65–99)
Potassium: 3.1 mmol/L — ABNORMAL LOW (ref 3.5–5.1)
Sodium: 140 mmol/L (ref 135–145)
Total Bilirubin: 0.8 mg/dL (ref 0.3–1.2)
Total Protein: 7 g/dL (ref 6.5–8.1)

## 2017-05-03 LAB — URINALYSIS, ROUTINE W REFLEX MICROSCOPIC
BILIRUBIN URINE: NEGATIVE
Glucose, UA: NEGATIVE mg/dL
Hgb urine dipstick: NEGATIVE
KETONES UR: NEGATIVE mg/dL
LEUKOCYTES UA: NEGATIVE
NITRITE: NEGATIVE
PH: 5 (ref 5.0–8.0)
Protein, ur: NEGATIVE mg/dL
SPECIFIC GRAVITY, URINE: 1.019 (ref 1.005–1.030)

## 2017-05-03 LAB — PROTIME-INR
INR: 1.02
PROTHROMBIN TIME: 13.3 s (ref 11.4–15.2)

## 2017-05-03 LAB — CBC
HEMATOCRIT: 33.7 % — AB (ref 39.0–52.0)
HEMOGLOBIN: 10.6 g/dL — AB (ref 13.0–17.0)
MCH: 25.7 pg — AB (ref 26.0–34.0)
MCHC: 31.5 g/dL (ref 30.0–36.0)
MCV: 81.6 fL (ref 78.0–100.0)
Platelets: 237 10*3/uL (ref 150–400)
RBC: 4.13 MIL/uL — AB (ref 4.22–5.81)
RDW: 15.1 % (ref 11.5–15.5)
WBC: 8.2 10*3/uL (ref 4.0–10.5)

## 2017-05-03 LAB — BLOOD GAS, ARTERIAL
Acid-Base Excess: 4.9 mmol/L — ABNORMAL HIGH (ref 0.0–2.0)
BICARBONATE: 28.8 mmol/L — AB (ref 20.0–28.0)
DRAWN BY: 421801
FIO2: 21
O2 Saturation: 97.1 %
PATIENT TEMPERATURE: 98.6
PCO2 ART: 41.4 mmHg (ref 32.0–48.0)
PH ART: 7.456 — AB (ref 7.350–7.450)
PO2 ART: 86.4 mmHg (ref 83.0–108.0)

## 2017-05-03 LAB — GLUCOSE, CAPILLARY: Glucose-Capillary: 149 mg/dL — ABNORMAL HIGH (ref 65–99)

## 2017-05-03 LAB — APTT: aPTT: 27 seconds (ref 24–36)

## 2017-05-03 MED ORDER — ALBUTEROL SULFATE (2.5 MG/3ML) 0.083% IN NEBU
2.5000 mg | INHALATION_SOLUTION | Freq: Once | RESPIRATORY_TRACT | Status: AC
  Administered 2017-05-03: 2.5 mg via RESPIRATORY_TRACT

## 2017-05-03 NOTE — Progress Notes (Addendum)
Pcp is Kathrine Haddock, CFNP @ [facility] in [place].  [place] 10/2016  He also manages his diabetes.  Patient gets sugar tested twice a year, not daily.  He states it can run 140 at doctors office, otherwise, he has accu check machine, and does not have a baseline for his blood sugar.  Last A1C 6.6 on 12/2015 Has heart murmur.  Sees cardio Dr. Idolina Primer  LOV 04/21/2017 Has had PFT & Doppler's done today, 05/03/2017

## 2017-05-03 NOTE — Progress Notes (Signed)
Pre-op Cardiac Surgery  Carotid Findings:  Findings suggest 1-39% internal carotid artery stenosis bilaterally. Vertebral arteries are patent with antegrade flow.  Upper Extremity Right Left  Brachial Pressures 132-Triphasic 145-Triphasic  Radial Waveforms Triphasic Triphasic  Ulnar Waveforms Triphasic Triphasic  Palmar Arch (Allen's Test) Signal decreases >50% with both radial and ulnar compression. Signal is unaffected with radial compression, and obliterates with ulnar compression.    Lower  Extremity Right Left  Dorsalis Pedis 151-Triphasic 103-Triphasic  Anterior Tibial    Posterior Tibial 168-Biphasic 161-Triphasic  Ankle/Brachial Indices 1.16 1.11   ABIs are within normal limits at rest.  05/03/2017 3:17 PM Maudry Mayhew, BS, RVT, RDCS, RDMS

## 2017-05-03 NOTE — Pre-Procedure Instructions (Signed)
John [name]  05/03/2017      [facility] [place], [place] - [place] [address] [place] [address] Phone: [phone] Fax: [phone]    Your procedure is scheduled on Thursday, March 7th   Report to [place] at [place].M.             (posted surgery time 7:30a - 2:08p)   Call this number if you have problems the morning of surgery:  [phone]   Remember:   Do not eat food or drink liquids after midnight, Wednesday.  Continue all medications as directed by your physician except follow these medication instructions before surgery below   Take these medicines the morning of surgery with A SIP OF WATER  amLODipine (NORVASC)  7 days prior to surgery STOP taking any Aspirin(unless otherwise instructed by your surgeon), Aleve, Naproxen, Ibuprofen, Motrin, Advil, Goody's, BC's, all herbal medications, fish oil, and all vitamins   WHAT DO I DO ABOUT MY DIABETES MEDICATION?   Marland Kitchen Do not take oral diabetes medicines (pills) the morning of surgery. SitaGLIPtin-MetFORMIN HCl (JANUMET XR)   How to Manage Your Diabetes Before and After Surgery  Why is it important to control my blood sugar before and after surgery? . Improving blood sugar levels before and after surgery helps healing and can limit problems. . A way of improving blood sugar control is eating a healthy diet by: o  Eating less sugar and carbohydrates o  Increasing activity/exercise o  Talking with your doctor about reaching your blood sugar goals . High blood sugars (greater than 180 mg/dL) can raise your risk of infections and slow your recovery, so you will need to focus on controlling your diabetes during the weeks before surgery. . Make sure that the doctor who takes care of your diabetes knows about your planned surgery including the date and location.  How do I manage my blood sugar before surgery? . Check your blood sugar at least 4 times a day, starting  2 days before surgery, to make sure that the level is not too high or low. o Check your blood sugar the morning of your surgery when you wake up and every 2 hours until you get to the Short Stay unit.  . If your blood sugar is less than 70 mg/dL, you will need to treat for low blood sugar: o Do not take insulin. o Treat a low blood sugar (less than 70 mg/dL) with  cup of clear juice (cranberry or apple), 4 glucose tablets, OR glucose gel. o Recheck blood sugar in 15 minutes after treatment (to make sure it is greater than 70 mg/dL). If your blood sugar is not greater than 70 mg/dL on recheck, call [phone] for further instructions.  . Report your blood sugar to the short stay nurse when you get to Short Stay.  . If you are admitted to the hospital after surgery: o Your blood sugar will be checked by the staff and you will probably be given insulin after surgery (instead of oral diabetes medicines) to make sure you have good blood sugar levels. o The goal for blood sugar control after surgery is 80-180 mg/dL.    Do not wear jewelry  Do not wear lotions, powders, or cologne, or deodorant.  Men may shave face and neck.  Do not bring valuables to the hospital.  [facility] is not responsible for any belongings or valuables.  Contacts, dentures or bridgework may not be  worn into surgery.  Leave your suitcase in the car.  After surgery it may be brought to your room.  For patients admitted to the hospital, discharge time will be determined by your treatment team.  Patients discharged the day of surgery will not be allowed to drive home.    Special instructions:   [facility]- Preparing For Surgery  Before surgery, you can play an important role. Because skin is not sterile, your skin needs to be as free of germs as possible. You can reduce the number of germs on your skin by washing with CHG (chlorahexidine gluconate) Soap before surgery.  CHG is an antiseptic cleaner which kills germs  and bonds with the skin to continue killing germs even after washing.  Please do not use if you have an allergy to CHG or antibacterial soaps. If your skin becomes reddened/irritated stop using the CHG.  Do not shave (including legs and underarms) for at least 48 hours prior to first CHG shower. It is OK to shave your face.  Please follow these instructions carefully.   1. Shower the NIGHT BEFORE SURGERY and the MORNING OF SURGERY with CHG.   2. If you chose to wash your hair, wash your hair first as usual with your normal shampoo.  3. After you shampoo, rinse your hair and body thoroughly to remove the shampoo.  4. Use CHG as you would any other liquid soap. You can apply CHG directly to the skin and wash gently with a scrungie or a clean washcloth.   5. Apply the CHG Soap to your body ONLY FROM THE NECK DOWN.  Do not use on open wounds or open sores. Avoid contact with your eyes, ears, mouth and genitals (private parts). Wash Face and genitals (private parts)  with your normal soap.  6. Wash thoroughly, paying special attention to the area where your surgery will be performed.  7. Thoroughly rinse your body with warm water from the neck down.  8. DO NOT shower/wash with your normal soap after using and rinsing off the CHG Soap.  9. Pat yourself dry with a CLEAN TOWEL.  10. Wear CLEAN PAJAMAS to bed the night before surgery, wear comfortable clothes the morning of surgery  11. Place CLEAN SHEETS on your bed the night of your first shower and DO NOT SLEEP WITH PETS.    Day of Surgery: Do not apply any deodorants/lotions. Please wear clean clothes to the hospital/surgery center.      Please read over the following fact sheets that you were given.

## 2017-05-04 ENCOUNTER — Encounter (HOSPITAL_COMMUNITY): Payer: Self-pay

## 2017-05-04 MED ORDER — MILRINONE LACTATE IN DEXTROSE 20-5 MG/100ML-% IV SOLN
0.1250 ug/kg/min | INTRAVENOUS | Status: DC
  Filled 2017-05-04: qty 100

## 2017-05-04 MED ORDER — EPINEPHRINE PF 1 MG/ML IJ SOLN
0.0000 ug/min | INTRAVENOUS | Status: DC
  Filled 2017-05-04: qty 4

## 2017-05-04 MED ORDER — POTASSIUM CHLORIDE 2 MEQ/ML IV SOLN
80.0000 meq | INTRAVENOUS | Status: DC
Start: 2017-05-05 — End: 2017-05-05
  Filled 2017-05-04: qty 40

## 2017-05-04 MED ORDER — TRANEXAMIC ACID 1000 MG/10ML IV SOLN
1.5000 mg/kg/h | INTRAVENOUS | Status: DC
  Filled 2017-05-04: qty 25

## 2017-05-04 MED ORDER — SODIUM CHLORIDE 0.9 % IV SOLN
1.5000 g | INTRAVENOUS | Status: AC
  Administered 2017-05-05: 1.5 g via INTRAVENOUS
  Filled 2017-05-04: qty 1.5

## 2017-05-04 MED ORDER — DOPAMINE-DEXTROSE 3.2-5 MG/ML-% IV SOLN
0.0000 ug/kg/min | INTRAVENOUS | Status: DC
  Filled 2017-05-04: qty 250

## 2017-05-04 MED ORDER — TRANEXAMIC ACID (OHS) PUMP PRIME SOLUTION
2.0000 mg/kg | INTRAVENOUS | Status: DC
Start: 2017-05-05 — End: 2017-05-05
  Filled 2017-05-04: qty 1.88

## 2017-05-04 MED ORDER — SODIUM CHLORIDE 0.9 % IV SOLN
30.0000 ug/min | INTRAVENOUS | Status: AC
  Administered 2017-05-05: 15 ug/min via INTRAVENOUS
  Filled 2017-05-04: qty 2

## 2017-05-04 MED ORDER — TRANEXAMIC ACID (OHS) BOLUS VIA INFUSION
15.0000 mg/kg | INTRAVENOUS | Status: AC
  Administered 2017-05-05: 1408.5 mg via INTRAVENOUS
  Filled 2017-05-04: qty 1409

## 2017-05-04 MED ORDER — SODIUM CHLORIDE 0.9 % IV SOLN
INTRAVENOUS | Status: DC
  Filled 2017-05-04: qty 30

## 2017-05-04 MED ORDER — CEFUROXIME SODIUM 750 MG IJ SOLR
750.0000 mg | INTRAMUSCULAR | Status: DC
  Filled 2017-05-04: qty 750

## 2017-05-04 MED ORDER — CHLORHEXIDINE GLUCONATE 0.12 % MT SOLN
15.0000 mL | Freq: Once | OROMUCOSAL | Status: AC
  Administered 2017-05-05: 15 mL via OROMUCOSAL
  Filled 2017-05-04: qty 15

## 2017-05-04 MED ORDER — MAGNESIUM SULFATE 50 % IJ SOLN
40.0000 meq | INTRAMUSCULAR | Status: DC
  Filled 2017-05-04: qty 9.85

## 2017-05-04 MED ORDER — SODIUM CHLORIDE 0.9 % IV SOLN
INTRAVENOUS | Status: DC
  Filled 2017-05-04: qty 1

## 2017-05-04 MED ORDER — DEXMEDETOMIDINE HCL IN NACL 400 MCG/100ML IV SOLN
0.1000 ug/kg/h | INTRAVENOUS | Status: DC
  Filled 2017-05-04: qty 100

## 2017-05-04 MED ORDER — METOPROLOL TARTRATE 12.5 MG HALF TABLET
12.5000 mg | ORAL_TABLET | Freq: Once | ORAL | Status: AC
  Administered 2017-05-05: 12.5 mg via ORAL
  Filled 2017-05-04: qty 1

## 2017-05-04 MED ORDER — SODIUM CHLORIDE 0.9 % IV SOLN
1500.0000 mg | INTRAVENOUS | Status: AC
  Administered 2017-05-05: 1500 mg via INTRAVENOUS
  Filled 2017-05-04: qty 1500

## 2017-05-04 MED ORDER — NITROGLYCERIN IN D5W 200-5 MCG/ML-% IV SOLN
2.0000 ug/min | INTRAVENOUS | Status: DC
  Filled 2017-05-04: qty 250

## 2017-05-04 MED ORDER — PLASMA-LYTE 148 IV SOLN
INTRAVENOUS | Status: AC
  Administered 2017-05-05: 500 mL
  Filled 2017-05-04: qty 2.5

## 2017-05-04 NOTE — Progress Notes (Signed)
Anesthesia Chart Review: Patient is a 77 year old male scheduled for AV replacement, CABG on 05/05/17 by Dr. Gilford Raid.  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   - PCP is Kathrine Haddock, NP at [facility] in [place]. - Cardiologist is Dr. Isaias Cowman at [facility] ([place] - Urologist is Dr. Elyse Hsu ([facility]).  Meds include amlodipine, HCTZ, losartan, lovastatin, Janumet XR.  BP (!) 153/60   Pulse 95   Temp 36.7 C   Resp 20   Ht 5\' 11"  (1.803 m)   Wt 207 lb (93.9 kg)   SpO2 96%   BMI 28.87 kg/m   EKG 05/03/17: NSR, left BBB. No significant change when compared to 04/06/17 tracing.  Cardiac cath 04/21/17:  Ost Ramus lesion is 90% stenosed.  Ost Cx to Prox Cx lesion is 80% stenosed.  Ost LAD to Prox LAD lesion is 70% stenosed.  Prox LAD lesion is 60% stenosed.  Prox RCA lesion is 40% stenosed.  Mid RCA to Dist RCA lesion is 40% stenosed.  Ost RPDA to RPDA lesion is 75% stenosed. 1.  Three-vessel coronary artery disease with 70% stenosis proximal LAD, 80% stenosis ostial left circumflex, 90% stenosis ostial ramus intermedius branch, and 75% stenosis proximal PDA 2.  Unable to cross calcific aortic valve with straight wire 3.  Severe aortic stenosis with calculated aortic valve area 1.0 cm by 2D echocardiogram 4.  Mild pulmonary hypertension with mean PA of 34 mmHg Recommendations 1.  CABG with AVR  Echo 12/29/16 ([place]): INTERPRETATION NORMAL LEFT VENTRICULAR SYSTOLIC FUNCTION WITH AN ESTIMATED EF = 55 % NORMAL RIGHT VENTRICULAR SYSTOLIC FUNCTION MILD TRICUSPID AND MITRAL VALVE INSUFFICIENCY MODERATE AORTIC VALVE STENOSIS WITH A CALCULATED AVA = 1.0 cm^2 BY CONTINUITY EQUATION MILD LVH  Carotid U/S 05/03/17: Final Interpretation: - Right Carotid: Velocities in the right ICA are  consistent with a 1-39% stenosis. - Left Carotid: Velocities in the left ICA are consistent with a 1-39% stenosis. - Vertebrals: Both vertebral arteries were patent with antegrade flow. - Subclavians: Normal flow hemodynamics were seen in bilateral subclavian arteries.  CXR 05/03/17: IMPRESSION: 1. Shallow lung inflation with mild elevation left hemidiaphragm. 2. No other active cardiopulmonary disease.  PFTs 05/03/17: FVC 2.62 (59%), FEV1 1.88 (59%), DLCO unc 18.83 (53%).  Preoperative labs noted. Cr 0.92. K 3.1. H/H 10.6/33.7, PLT 237. PT/PTT WNL. A1c 6.7. UA WNL.   If no acute changes then I anticipate that he can proceed as planned.  George Hugh [facility] Short Stay Center/Anesthesiology Phone [phone] 05/04/2017 10:11 AM

## 2017-05-04 NOTE — Anesthesia Preprocedure Evaluation (Addendum)
Anesthesia Evaluation  Patient identified by MRN, date of birth, ID band Patient awake    Reviewed: Allergy & Precautions, NPO status , Patient's Chart, lab work & pertinent test results, reviewed documented beta blocker date and time   History of Anesthesia Complications Negative for: history of anesthetic complications  Airway Mallampati: II  TM Distance: >3 FB Neck ROM: Full    Dental  (+) Dental Advisory Given   Pulmonary shortness of breath, former smoker (quit 1982),    breath sounds clear to auscultation       Cardiovascular hypertension, Pt. on medications + angina with exertion + CAD (3v disease: 70% proximal LAD stenosis, 80% ostial left circumflex stenosis, 90% ostial ramus stenosis, and 75% proximal PDA stenosis.)  + Valvular Problems/Murmurs AS  Rhythm:Regular Rate:Normal + Systolic murmurs '18 ECHO: EF 55%, mild TR, mild MR, mod AS with AVA 1.0 cm2     Neuro/Psych negative neurological ROS     GI/Hepatic negative GI ROS, Neg liver ROS,   Endo/Other  diabetes (glu 151), Oral Hypoglycemic Agents  Renal/GU Renal InsufficiencyRenal disease (creat 0.92)   H/o bladder cancer    Musculoskeletal  (+) Arthritis ,   Abdominal   Peds  Hematology  (+) Blood dyscrasia (Hb 10.6), ,   Anesthesia Other Findings   Reproductive/Obstetrics                            Anesthesia Physical Anesthesia Plan  ASA: III  Anesthesia Plan: General   Post-op Pain Management:    Induction: Intravenous  PONV Risk Score and Plan: 2 and Treatment may vary due to age or medical condition  Airway Management Planned: Oral ETT  Additional Equipment: Arterial line, PA Cath, TEE and Ultrasound Guidance Line Placement  Intra-op Plan:   Post-operative Plan: Post-operative intubation/ventilation  Informed Consent: I have reviewed the patients History and Physical, chart, labs and discussed the  procedure including the risks, benefits and alternatives for the proposed anesthesia with the patient or authorized representative who has indicated his/her understanding and acceptance.   Dental advisory given  Plan Discussed with: CRNA and Surgeon  Anesthesia Plan Comments: (Plan routine monitors, A line, PA cath, GETA with TEE and post op ventilation)        Anesthesia Quick Evaluation

## 2017-05-04 NOTE — H&P (Signed)
[address]       [place] [address]             [phone]      Cardiothoracic Surgery Admission History and Physical   PCP is Kathrine Haddock, NP  Referring Provider is Isaias Cowman, MD      Chief Complaint  Patient presents with  . Aortic Stenosis      . Coronary Artery Disease   HPI:   The patient is a 77 year old active gentleman with history of diabetes, hyperlipidemia, hypertension, and aortic stenosis that has been followed by Dr.Paraschos. His last echocardiogram on 12/28/2016 showed a mean aortic transvalvular gradient of 37 mmHg with a peak gradient of 63.4 mmHg. Left ventricular ejection fraction was 55%. There is mild tricuspid and mild mitral valve insufficiency. He now presents with a 42-month history of exertional chest discomfort and shortness of breath which has been occurring with activities such as walking up a hill or up the steps. He has not had these symptoms with normal daily activities around the house or walking on level ground. He has had no symptoms at rest. He has had no dizziness or syncope. He denies any orthopnea or PND. He has had no peripheral edema. He underwent cardiac catheterization on 04/21/2017 which showed severe three-vessel coronary disease with 70% proximal LAD stenosis, 80% ostial left circumflex stenosis, 90% ostial ramus stenosis, and 75% proximal PDA stenosis. Aortic valve could not be crossed. There is mild pulmonary hypertension at 45/27.       Past Medical History:  Diagnosis Date  . Anemia   . Cancer ([place])    bladder  . Cataracts, bilateral   . Diabetes mellitus without complication ([place])    takes Janumet daily  . Dyspnea   . Heart murmur   . History of colon polyps    benign  . Hyperlipidemia    takes Lovastatin daily  . Hypertension    takes Hyzaar and Amlodipine daily  . Joint pain   . Muscle cramps    occasionally in legs   . Osteoarthritis   . Urinary urgency         Past Surgical  History:  Procedure Laterality Date  . BLADDER SURGERY    . COLONOSCOPY    . CYSTOURETHROSCOPY    . JOINT REPLACEMENT Left    hip  . KNEE ARTHROPLASTY Right 01/12/2016   Procedure: RIGHT TOTAL KNEE ARTHROPLASTY WITH COMPUTER NAVIGATION; Surgeon: Rod Can, MD; Location: [facility]; Service: Orthopedics; Laterality: Right; Needs RNFA  . RIGHT/LEFT HEART CATH AND CORONARY ANGIOGRAPHY N/A 04/21/2017   Procedure: RIGHT/LEFT HEART CATH AND CORONARY ANGIOGRAPHY; Surgeon: Isaias Cowman, MD; Location: [facility]; Service: Cardiovascular; Laterality: N/A;  . SKIN SURGERY Left    arm        Family History  Problem Relation Age of Onset  . Dementia Mother   . Stroke Father   . Diabetes Father   . Heart disease Father   . Cancer Sister   . Hypertension Brother   . Cancer Sister    Social History  Social History        Tobacco Use  . Smoking status: Former Smoker    Last attempt to quit: 12/15/1980    Years since quitting: 36.3  . Smokeless tobacco: Never Used  Substance Use Topics  . Alcohol use: No    Alcohol/week: 0.0 oz  . Drug use: No         Current  Outpatient Medications  Medication Sig Dispense Refill  . amLODipine (NORVASC) 5 MG tablet TAKE ONE TABLET BY MOUTH ONCE DAILY 90 tablet 3  . Cholecalciferol (VITAMIN D) 2000 units tablet Take 2,000 Units by mouth daily.    . Cyanocobalamin (RA VITAMIN B-12 TR) 1000 MCG TBCR Take 1,000 mcg by mouth daily.     . hydrochlorothiazide (MICROZIDE) 12.5 MG capsule Take 1 capsule (12.5 mg total) by mouth daily. 90 capsule 1  . losartan (COZAAR) 50 MG tablet Take 1 tablet (50 mg total) by mouth daily. 90 tablet 3  . lovastatin (MEVACOR) 40 MG tablet Take 1 tablet (40 mg total) by mouth daily. 90 tablet 3  . naproxen sodium (ALEVE) 220 MG tablet Take 440 mg by mouth daily as needed (pain).    . SitaGLIPtin-MetFORMIN HCl (JANUMET XR) 50-1000 MG TB24 Take 1 tablet by mouth 2 (two) times daily. 180 tablet 3   No current  facility-administered medications for this visit.         Allergies  Allergen Reactions  . Aspirin Other (See Comments)    bruising   Review of Systems   Constitutional: Positive for activity change and fatigue.  HENT: Negative.  Does not see his dentist regularly. Has upper dentures and only two teeth on bottom.  Eyes: Negative.  Respiratory: Positive for chest tightness and shortness of breath.  Cardiovascular: Positive for chest pain. Negative for leg swelling.  Gastrointestinal: Negative.  Endocrine: Negative.  Genitourinary: Negative.  Musculoskeletal: Positive for arthralgias, gait problem, joint swelling and myalgias.  Leg cramps  Skin: Negative.  Allergic/Immunologic: Negative.  Neurological: Negative for dizziness and syncope.  Hematological: Bruises/bleeds easily.  Psychiatric/Behavioral: Negative.   BP 139/77  Pulse 78  Resp 20  Ht 5\' 11"  (1.803 m)  Wt 205 lb (93 kg)  SpO2 97% Comment: RA  BMI 28.59 kg/m   Physical Exam   Constitutional: He is oriented to person, place, and time. He appears well-developed and well-nourished. No distress.  HENT:  Head: Normocephalic and atraumatic.  Mouth/Throat: Oropharynx is clear and moist.  Upper dentures. Two lower incisors appear to be in good condition with crowns in place. Good oral hygiene.  Eyes: Conjunctivae and EOM are normal. Pupils are equal, round, and reactive to light.  Neck: Normal range of motion. Neck supple. No JVD present. No tracheal deviation present. No thyromegaly present.  Cardiovascular: Normal rate and regular rhythm.  Murmur heard. 3/6 systolic murmur RSB  Pulmonary/Chest: Effort normal and breath sounds normal. No respiratory distress. He has no rales.  Abdominal: Soft. Bowel sounds are normal. He exhibits no distension and no mass. There is no tenderness.  Musculoskeletal: Normal range of motion. He exhibits no edema or tenderness.  Lymphadenopathy:  He has no cervical adenopathy.    Neurological: He is alert and oriented to person, place, and time. He has normal strength. No cranial nerve deficit or sensory deficit.  Skin: Skin is warm and dry.  Psychiatric: He has a normal mood and affect.   Diagnostic Tests:  Physicians  Panel Physicians Referring Physician Case Authorizing Physician  Paraschos, Alexander, MD (Primary)    Procedures  RIGHT/LEFT HEART CATH AND CORONARY ANGIOGRAPHY  Conclusion  Ost Ramus lesion is 90% stenosed.  Ost Cx to Prox Cx lesion is 80% stenosed.  Ost LAD to Prox LAD lesion is 70% stenosed.  Prox LAD lesion is 60% stenosed.  Prox RCA lesion is 40% stenosed.  Mid RCA to Dist RCA lesion is 40% stenosed.  Ost RPDA  to RPDA lesion is 75% stenosed. 1. Three-vessel coronary artery disease with 70% stenosis proximal LAD, 80% stenosis ostial left circumflex, 90% stenosis ostial ramus intermedius branch, and 75% stenosis proximal PDA  2. Unable to cross calcific aortic valve with straight wire  3. Severe aortic stenosis with calculated aortic valve area 1.0 cm by 2D echocardiogram  4. Mild pulmonary hypertension with mean PA of 34 mmHg  Recommendations  1. CABG with AVR   Indications  Chest pain, unspecified type [R07.9 (ICD-10-CM)]  Procedural Details/Technique  Technical Details The right groin was prepped and draped in usual sterile manner. A 7 French sheath was introduced in the right femoral vein a 6 French sheath was introduced right femoral artery. Right-sided pressures were obtained with a pulmonary arterial catheter. Coronary angiography was performed with standard JL4 and JR4 diagnostic catheters. I was unable to cross the aortic valve with a straight wire. The right femoral artery was closed with a minx closure device. The patient received 1 mg of Versed and 50 mcg of fentanyl. Total contrast was 100 cc. There were no periprocedural complications.   Estimated blood loss <50 mL.  During this procedure the patient was administered the  following to achieve and maintain moderate conscious sedation: Versed mg, while the patient's heart rate, blood pressure, and oxygen saturation were continuously monitored.  Coronary Findings  Diagnostic  Dominance: Right  Left Anterior Descending  Ost LAD to Prox LAD lesion 70% stenosed  Ost LAD to Prox LAD lesion is 70% stenosed. The lesion is tubular.  Prox LAD lesion 60% stenosed  Prox LAD lesion is 60% stenosed. The lesion is tubular.  Ramus Intermedius  Ost Ramus lesion 90% stenosed  Ost Ramus lesion is 90% stenosed.  Left Circumflex  Ost Cx to Prox Cx lesion 80% stenosed  Ost Cx to Prox Cx lesion is 80% stenosed.  Right Coronary Artery  Prox RCA lesion 40% stenosed  Prox RCA lesion is 40% stenosed. The lesion is discrete.  Mid RCA to Dist RCA lesion 40% stenosed  Mid RCA to Dist RCA lesion is 40% stenosed. The lesion is tubular.  Right Posterior Descending Artery  Ost RPDA to RPDA lesion 75% stenosed  Ost RPDA to RPDA lesion is 75% stenosed. The lesion is tubular.  Intervention  No interventions have been documented.  Coronary Diagrams  Diagnostic Diagram     Implants     Vascular Products  Device Closure Mynxgrip 6/39f - [id number] - Implanted   Inventory item: Device Closure Mynxgrip 6/12f Model/Cat number: [id number]  Manufacturer: [employer] Lot number: [id number]  Device identifier: [id number] Device identifier type: GS1  Area Of Implantation: Groin    GUDID Information   Request status Successful    Brand name: [facility] Version/Model: [id number]  Company name: [place] [employer]. MRI safety info as of 04/21/17: Labeling does not contain MRI Safety Information  Contains dry or latex rubber: No    GMDN P.T. name: Wound hydrogel dressing, sterile    As of 04/21/2017    Status: Implanted     MERGE Images   Link to Procedure Log    Show images for CARDIAC CATHETERIZATION  Procedure Log   Hemo Data 04/21/17 0943--04/21/17 1042 before discharge  AO Systolic Cath  Pressure AO Diastolic Cath Pressure AO Mean Cath Pressure PA Systolic Cath Pressure PA Diastolic Cath Pressure PA Mean Cath Pressure RV Systolic Cath Pressure RV Diastolic Cath Pressure RV End Diastolic AO O2 Sat PA O2 Sat AO O2 Sat TDCO  - - -  45 mmHg 27 mmHg 34 mmHg - - - - - - -  - - - - - - - - - - - - 6.82 L/min  - - - - - - 44 mmHg 16 mmHg 22 mmHg - - - -  123 57 mmHg 19 mmHg - - - - - - - - - -  127 63 mmHg 65 mmHg - - - - - - - - - -  - - - - - - - - - 97.3 % - SA -  - - - - - - - - - - PA - -  2D echo study from 12/28/16 at [facility] reviewed on disc.   Impression:   This 77 year old gentleman has stage D, severe, symptomatic aortic stenosis and severe multivessel coronary artery disease with New York Heart Association functional class II symptoms of exertional fatigue and shortness of breath consistent with chronic diastolic heart failure as well as stable angina. I agree that coronary bypass graft surgery and aortic valve replacement is indicated in this patient. I recommended using a bioprosthetic valve given his age of 63. I reviewed the echocardiogram and catheterization findings with him and his family and answered all of their questions. I discussed the operative procedure with the patient and family including alternatives, benefits and risks; including but not limited to bleeding, blood transfusion, infection, stroke, myocardial infarction, graft failure, heart block requiring a permanent pacemaker, organ dysfunction, and death. John [name] understands and agrees to proceed.   Plan:   Coronary bypass graft surgery and aortic valve replacement use prosthetic valve on Thursday, May 05, 2017.    Gaye Pollack, MD  [facility]  [phone]

## 2017-05-05 ENCOUNTER — Inpatient Hospital Stay (HOSPITAL_COMMUNITY): Payer: PPO | Admitting: Anesthesiology

## 2017-05-05 ENCOUNTER — Inpatient Hospital Stay (HOSPITAL_COMMUNITY)
Admission: RE | Admit: 2017-05-05 | Discharge: 2017-05-10 | DRG: 220 | Disposition: A | Discharge status: Home / Self Care | Payer: PPO | Source: Ambulatory Visit | Attending: Surgery | Admitting: Surgery

## 2017-05-05 ENCOUNTER — Inpatient Hospital Stay (HOSPITAL_COMMUNITY): Payer: PPO | Admitting: Vascular Surgery

## 2017-05-05 ENCOUNTER — Inpatient Hospital Stay (HOSPITAL_COMMUNITY): Payer: PPO

## 2017-05-05 ENCOUNTER — Encounter (HOSPITAL_COMMUNITY)
Admission: RE | Disposition: A | Discharge status: Home / Self Care | Payer: Self-pay | Source: Ambulatory Visit | Attending: Surgery

## 2017-05-05 DIAGNOSIS — I2510 Atherosclerotic heart disease of native coronary artery without angina pectoris: Secondary | ICD-10-CM | POA: Diagnosis present

## 2017-05-05 DIAGNOSIS — Z8551 Personal history of malignant neoplasm of bladder: Secondary | ICD-10-CM

## 2017-05-05 DIAGNOSIS — Z96651 Presence of right artificial knee joint: Secondary | ICD-10-CM | POA: Diagnosis present

## 2017-05-05 DIAGNOSIS — Z87891 Personal history of nicotine dependence: Secondary | ICD-10-CM | POA: Diagnosis not present

## 2017-05-05 DIAGNOSIS — Z952 Presence of prosthetic heart valve: Secondary | ICD-10-CM

## 2017-05-05 DIAGNOSIS — D62 Acute posthemorrhagic anemia: Secondary | ICD-10-CM | POA: Diagnosis not present

## 2017-05-05 DIAGNOSIS — Z8249 Family history of ischemic heart disease and other diseases of the circulatory system: Secondary | ICD-10-CM

## 2017-05-05 DIAGNOSIS — Z886 Allergy status to analgesic agent status: Secondary | ICD-10-CM

## 2017-05-05 DIAGNOSIS — Z96642 Presence of left artificial hip joint: Secondary | ICD-10-CM | POA: Diagnosis present

## 2017-05-05 DIAGNOSIS — E119 Type 2 diabetes mellitus without complications: Secondary | ICD-10-CM | POA: Diagnosis present

## 2017-05-05 DIAGNOSIS — I083 Combined rheumatic disorders of mitral, aortic and tricuspid valves: Principal | ICD-10-CM | POA: Diagnosis present

## 2017-05-05 DIAGNOSIS — Z79899 Other long term (current) drug therapy: Secondary | ICD-10-CM | POA: Diagnosis not present

## 2017-05-05 DIAGNOSIS — I110 Hypertensive heart disease with heart failure: Secondary | ICD-10-CM | POA: Diagnosis present

## 2017-05-05 DIAGNOSIS — I5032 Chronic diastolic (congestive) heart failure: Secondary | ICD-10-CM | POA: Diagnosis present

## 2017-05-05 DIAGNOSIS — Z951 Presence of aortocoronary bypass graft: Secondary | ICD-10-CM

## 2017-05-05 DIAGNOSIS — Z01818 Encounter for other preprocedural examination: Secondary | ICD-10-CM | POA: Diagnosis not present

## 2017-05-05 DIAGNOSIS — I272 Pulmonary hypertension, unspecified: Secondary | ICD-10-CM | POA: Diagnosis present

## 2017-05-05 DIAGNOSIS — Z86010 Personal history of colonic polyps: Secondary | ICD-10-CM | POA: Diagnosis not present

## 2017-05-05 DIAGNOSIS — I4891 Unspecified atrial fibrillation: Secondary | ICD-10-CM | POA: Diagnosis not present

## 2017-05-05 DIAGNOSIS — D696 Thrombocytopenia, unspecified: Secondary | ICD-10-CM | POA: Diagnosis present

## 2017-05-05 DIAGNOSIS — Z7984 Long term (current) use of oral hypoglycemic drugs: Secondary | ICD-10-CM | POA: Diagnosis not present

## 2017-05-05 DIAGNOSIS — Z833 Family history of diabetes mellitus: Secondary | ICD-10-CM

## 2017-05-05 DIAGNOSIS — E785 Hyperlipidemia, unspecified: Secondary | ICD-10-CM | POA: Diagnosis present

## 2017-05-05 DIAGNOSIS — I350 Nonrheumatic aortic (valve) stenosis: Secondary | ICD-10-CM

## 2017-05-05 DIAGNOSIS — I358 Other nonrheumatic aortic valve disorders: Secondary | ICD-10-CM | POA: Diagnosis not present

## 2017-05-05 HISTORY — PX: AORTIC VALVE REPLACEMENT: SHX41

## 2017-05-05 HISTORY — DX: Angina pectoris, unspecified: I20.9

## 2017-05-05 HISTORY — DX: Heart failure, unspecified: I50.9

## 2017-05-05 HISTORY — DX: Cardiac arrhythmia, unspecified: I49.9

## 2017-05-05 HISTORY — DX: Presence of aortocoronary bypass graft: Z95.1

## 2017-05-05 HISTORY — DX: Other specified health status: Z78.9

## 2017-05-05 HISTORY — DX: Prediabetes: R73.03

## 2017-05-05 HISTORY — PX: CORONARY ARTERY BYPASS GRAFT: SHX141

## 2017-05-05 HISTORY — PX: TEE WITHOUT CARDIOVERSION: SHX5443

## 2017-05-05 HISTORY — DX: Presence of prosthetic heart valve: Z95.2

## 2017-05-05 LAB — POCT I-STAT, CHEM 8
BUN: 19 mg/dL (ref 6–20)
BUN: 17 mg/dL (ref 6–20)
BUN: 19 mg/dL (ref 6–20)
BUN: 19 mg/dL (ref 6–20)
BUN: 17 mg/dL (ref 6–20)
BUN: 17 mg/dL (ref 6–20)
BUN: 18 mg/dL (ref 6–20)
BUN: 17 mg/dL (ref 6–20)
BUN: 18 mg/dL (ref 6–20)
CALCIUM ION: 1.1 mmol/L — AB (ref 1.15–1.40)
CALCIUM ION: 1.11 mmol/L — AB (ref 1.15–1.40)
CHLORIDE: 99 mmol/L — AB (ref 101–111)
CHLORIDE: 94 mmol/L — AB (ref 101–111)
CHLORIDE: 99 mmol/L — AB (ref 101–111)
CHLORIDE: 100 mmol/L — AB (ref 101–111)
CHLORIDE: 101 mmol/L (ref 101–111)
CREATININE: 0.9 mg/dL (ref 0.61–1.24)
CREATININE: 0.8 mg/dL (ref 0.61–1.24)
CREATININE: 0.8 mg/dL (ref 0.61–1.24)
Calcium, Ion: 0.92 mmol/L — ABNORMAL LOW (ref 1.15–1.40)
Calcium, Ion: 1.12 mmol/L — ABNORMAL LOW (ref 1.15–1.40)
Calcium, Ion: 1 mmol/L — ABNORMAL LOW (ref 1.15–1.40)
Calcium, Ion: 1.02 mmol/L — ABNORMAL LOW (ref 1.15–1.40)
Calcium, Ion: 1.03 mmol/L — ABNORMAL LOW (ref 1.15–1.40)
Calcium, Ion: 0.99 mmol/L — ABNORMAL LOW (ref 1.15–1.40)
Calcium, Ion: 1.08 mmol/L — ABNORMAL LOW (ref 1.15–1.40)
Chloride: 98 mmol/L — ABNORMAL LOW (ref 101–111)
Chloride: 99 mmol/L — ABNORMAL LOW (ref 101–111)
Chloride: 99 mmol/L — ABNORMAL LOW (ref 101–111)
Chloride: 104 mmol/L (ref 101–111)
Creatinine, Ser: 0.9 mg/dL (ref 0.61–1.24)
Creatinine, Ser: 0.7 mg/dL (ref 0.61–1.24)
Creatinine, Ser: 0.9 mg/dL (ref 0.61–1.24)
Creatinine, Ser: 0.8 mg/dL (ref 0.61–1.24)
Creatinine, Ser: 0.8 mg/dL (ref 0.61–1.24)
Creatinine, Ser: 0.9 mg/dL (ref 0.61–1.24)
GLUCOSE: 138 mg/dL — AB (ref 65–99)
GLUCOSE: 124 mg/dL — AB (ref 65–99)
GLUCOSE: 187 mg/dL — AB (ref 65–99)
Glucose, Bld: 129 mg/dL — ABNORMAL HIGH (ref 65–99)
Glucose, Bld: 131 mg/dL — ABNORMAL HIGH (ref 65–99)
Glucose, Bld: 185 mg/dL — ABNORMAL HIGH (ref 65–99)
Glucose, Bld: 184 mg/dL — ABNORMAL HIGH (ref 65–99)
Glucose, Bld: 144 mg/dL — ABNORMAL HIGH (ref 65–99)
Glucose, Bld: 127 mg/dL — ABNORMAL HIGH (ref 65–99)
HCT: 29 % — ABNORMAL LOW (ref 39.0–52.0)
HCT: 23 % — ABNORMAL LOW (ref 39.0–52.0)
HCT: 24 % — ABNORMAL LOW (ref 39.0–52.0)
HEMATOCRIT: 25 % — AB (ref 39.0–52.0)
HEMATOCRIT: 27 % — AB (ref 39.0–52.0)
HEMATOCRIT: 24 % — AB (ref 39.0–52.0)
HEMATOCRIT: 24 % — AB (ref 39.0–52.0)
HEMATOCRIT: 22 % — AB (ref 39.0–52.0)
HEMATOCRIT: 26 % — AB (ref 39.0–52.0)
HEMOGLOBIN: 9.9 g/dL — AB (ref 13.0–17.0)
HEMOGLOBIN: 8.5 g/dL — AB (ref 13.0–17.0)
HEMOGLOBIN: 9.2 g/dL — AB (ref 13.0–17.0)
HEMOGLOBIN: 8.2 g/dL — AB (ref 13.0–17.0)
Hemoglobin: 7.8 g/dL — ABNORMAL LOW (ref 13.0–17.0)
Hemoglobin: 8.2 g/dL — ABNORMAL LOW (ref 13.0–17.0)
Hemoglobin: 8.2 g/dL — ABNORMAL LOW (ref 13.0–17.0)
Hemoglobin: 7.5 g/dL — ABNORMAL LOW (ref 13.0–17.0)
Hemoglobin: 8.8 g/dL — ABNORMAL LOW (ref 13.0–17.0)
POTASSIUM: 3 mmol/L — AB (ref 3.5–5.1)
POTASSIUM: 3.1 mmol/L — AB (ref 3.5–5.1)
POTASSIUM: 3 mmol/L — AB (ref 3.5–5.1)
POTASSIUM: 2.9 mmol/L — AB (ref 3.5–5.1)
Potassium: 3 mmol/L — ABNORMAL LOW (ref 3.5–5.1)
Potassium: 3.1 mmol/L — ABNORMAL LOW (ref 3.5–5.1)
Potassium: 3.4 mmol/L — ABNORMAL LOW (ref 3.5–5.1)
Potassium: 3.1 mmol/L — ABNORMAL LOW (ref 3.5–5.1)
Potassium: 4 mmol/L (ref 3.5–5.1)
SODIUM: 141 mmol/L (ref 135–145)
SODIUM: 140 mmol/L (ref 135–145)
SODIUM: 142 mmol/L (ref 135–145)
SODIUM: 143 mmol/L (ref 135–145)
SODIUM: 142 mmol/L (ref 135–145)
SODIUM: 142 mmol/L (ref 135–145)
SODIUM: 142 mmol/L (ref 135–145)
Sodium: 141 mmol/L (ref 135–145)
Sodium: 142 mmol/L (ref 135–145)
TCO2: 33 mmol/L — AB (ref 22–32)
TCO2: 30 mmol/L (ref 22–32)
TCO2: 32 mmol/L (ref 22–32)
TCO2: 32 mmol/L (ref 22–32)
TCO2: 30 mmol/L (ref 22–32)
TCO2: 30 mmol/L (ref 22–32)
TCO2: 28 mmol/L (ref 22–32)
TCO2: 28 mmol/L (ref 22–32)
TCO2: 25 mmol/L (ref 22–32)

## 2017-05-05 LAB — CBC
HCT: 26.2 % — ABNORMAL LOW (ref 39.0–52.0)
HEMATOCRIT: 26.7 % — AB (ref 39.0–52.0)
HEMOGLOBIN: 8.3 g/dL — AB (ref 13.0–17.0)
HEMOGLOBIN: 8.6 g/dL — AB (ref 13.0–17.0)
MCH: 25.7 pg — ABNORMAL LOW (ref 26.0–34.0)
MCH: 26.5 pg (ref 26.0–34.0)
MCHC: 31.7 g/dL (ref 30.0–36.0)
MCHC: 32.2 g/dL (ref 30.0–36.0)
MCV: 81.1 fL (ref 78.0–100.0)
MCV: 82.2 fL (ref 78.0–100.0)
PLATELETS: 151 10*3/uL (ref 150–400)
Platelets: 173 10*3/uL (ref 150–400)
RBC: 3.23 MIL/uL — AB (ref 4.22–5.81)
RBC: 3.25 MIL/uL — ABNORMAL LOW (ref 4.22–5.81)
RDW: 14.9 % (ref 11.5–15.5)
RDW: 15.4 % (ref 11.5–15.5)
WBC: 14.7 10*3/uL — AB (ref 4.0–10.5)
WBC: 14.9 10*3/uL — ABNORMAL HIGH (ref 4.0–10.5)

## 2017-05-05 LAB — POCT I-STAT 3, ART BLOOD GAS (G3+)
ACID-BASE DEFICIT: 1 mmol/L (ref 0.0–2.0)
Acid-Base Excess: 9 mmol/L — ABNORMAL HIGH (ref 0.0–2.0)
Acid-Base Excess: 1 mmol/L (ref 0.0–2.0)
BICARBONATE: 32.8 mmol/L — AB (ref 20.0–28.0)
Bicarbonate: 25.4 mmol/L (ref 20.0–28.0)
Bicarbonate: 24.1 mmol/L (ref 20.0–28.0)
Bicarbonate: 25.4 mmol/L (ref 20.0–28.0)
O2 Saturation: 100 %
O2 Saturation: 99 %
O2 Saturation: 97 %
O2 Saturation: 97 %
PCO2 ART: 43.1 mmHg (ref 32.0–48.0)
PCO2 ART: 40.9 mmHg (ref 32.0–48.0)
PCO2 ART: 49.6 mmHg — AB (ref 32.0–48.0)
PH ART: 7.489 — AB (ref 7.350–7.450)
PH ART: 7.349 — AB (ref 7.350–7.450)
PH ART: 7.324 — AB (ref 7.350–7.450)
PO2 ART: 107 mmHg (ref 83.0–108.0)
Patient temperature: 38.3
Patient temperature: 38.3
TCO2: 34 mmol/L — ABNORMAL HIGH (ref 22–32)
TCO2: 27 mmol/L (ref 22–32)
TCO2: 25 mmol/L (ref 22–32)
TCO2: 27 mmol/L (ref 22–32)
pCO2 arterial: 44.4 mmHg (ref 32.0–48.0)
pH, Arterial: 7.401 (ref 7.350–7.450)
pO2, Arterial: 347 mmHg — ABNORMAL HIGH (ref 83.0–108.0)
pO2, Arterial: 130 mmHg — ABNORMAL HIGH (ref 83.0–108.0)
pO2, Arterial: 101 mmHg (ref 83.0–108.0)

## 2017-05-05 LAB — MAGNESIUM: Magnesium: 3.4 mg/dL — ABNORMAL HIGH (ref 1.7–2.4)

## 2017-05-05 LAB — HEMOGLOBIN AND HEMATOCRIT, BLOOD
HCT: 22.3 % — ABNORMAL LOW (ref 39.0–52.0)
Hemoglobin: 6.9 g/dL — CL (ref 13.0–17.0)

## 2017-05-05 LAB — POCT I-STAT 4, (NA,K, GLUC, HGB,HCT)
Glucose, Bld: 101 mg/dL — ABNORMAL HIGH (ref 65–99)
HCT: 24 % — ABNORMAL LOW (ref 39.0–52.0)
HEMOGLOBIN: 8.2 g/dL — AB (ref 13.0–17.0)
Potassium: 2.7 mmol/L — CL (ref 3.5–5.1)
Sodium: 145 mmol/L (ref 135–145)

## 2017-05-05 LAB — GLUCOSE, CAPILLARY
GLUCOSE-CAPILLARY: 122 mg/dL — AB (ref 65–99)
GLUCOSE-CAPILLARY: 129 mg/dL — AB (ref 65–99)
GLUCOSE-CAPILLARY: 137 mg/dL — AB (ref 65–99)
GLUCOSE-CAPILLARY: 144 mg/dL — AB (ref 65–99)
GLUCOSE-CAPILLARY: 126 mg/dL — AB (ref 65–99)
Glucose-Capillary: 151 mg/dL — ABNORMAL HIGH (ref 65–99)
Glucose-Capillary: 121 mg/dL — ABNORMAL HIGH (ref 65–99)
Glucose-Capillary: 161 mg/dL — ABNORMAL HIGH (ref 65–99)

## 2017-05-05 LAB — CREATININE, SERUM
CREATININE: 0.94 mg/dL (ref 0.61–1.24)
GFR calc Af Amer: >60 mL/min (ref >60)
GFR calc non Af Amer: >60 mL/min (ref >60)

## 2017-05-05 LAB — APTT: APTT: 35 s (ref 24–36)

## 2017-05-05 LAB — PROTIME-INR
INR: 1.49
PROTHROMBIN TIME: 17.9 s — AB (ref 11.4–15.2)

## 2017-05-05 LAB — PREPARE RBC (CROSSMATCH)

## 2017-05-05 LAB — PLATELET COUNT: PLATELETS: 158 10*3/uL (ref 150–400)

## 2017-05-05 SURGERY — REPLACEMENT, AORTIC VALVE, OPEN
Anesthesia: General | Site: Chest

## 2017-05-05 MED ORDER — MIDAZOLAM HCL 10 MG/2ML IJ SOLN
INTRAMUSCULAR | Status: AC
  Filled 2017-05-05: qty 2

## 2017-05-05 MED ORDER — PANTOPRAZOLE SODIUM 40 MG PO TBEC
40.0000 mg | DELAYED_RELEASE_TABLET | Freq: Every day | ORAL | Status: DC
  Administered 2017-05-07: 40 mg via ORAL
  Filled 2017-05-05: qty 1

## 2017-05-05 MED ORDER — MIDAZOLAM HCL 2 MG/2ML IJ SOLN
INTRAMUSCULAR | Status: AC
  Filled 2017-05-05: qty 2

## 2017-05-05 MED ORDER — SODIUM CHLORIDE 0.9 % IV SOLN: 250.0000 mL | INTRAVENOUS | Status: DC

## 2017-05-05 MED ORDER — SODIUM CHLORIDE 0.45 % IV SOLN
INTRAVENOUS | Status: DC | PRN
  Administered 2017-05-05: 15:00:00 via INTRAVENOUS

## 2017-05-05 MED ORDER — ALBUMIN HUMAN 5 % IV SOLN
INTRAVENOUS | Status: DC | PRN
  Administered 2017-05-05 (×2): via INTRAVENOUS

## 2017-05-05 MED ORDER — MIDAZOLAM HCL 5 MG/5ML IJ SOLN
INTRAMUSCULAR | Status: DC | PRN
  Administered 2017-05-05: 3 mg via INTRAVENOUS
  Administered 2017-05-05: 5 mg via INTRAVENOUS
  Administered 2017-05-05 (×2): 2 mg via INTRAVENOUS

## 2017-05-05 MED ORDER — LACTATED RINGERS IV SOLN: 500.0000 mL | Freq: Once | INTRAVENOUS | Status: DC | PRN

## 2017-05-05 MED ORDER — 0.9 % SODIUM CHLORIDE (POUR BTL) OPTIME
TOPICAL | Status: DC | PRN
  Administered 2017-05-05: 1000 mL
  Administered 2017-05-05: 5000 mL

## 2017-05-05 MED ORDER — ALBUMIN HUMAN 5 % IV SOLN
250.0000 mL | INTRAVENOUS | Status: AC | PRN
  Administered 2017-05-05 (×2): 250 mL via INTRAVENOUS

## 2017-05-05 MED ORDER — ORAL CARE MOUTH RINSE
15.0000 mL | OROMUCOSAL | Status: DC
  Administered 2017-05-05: 15 mL via OROMUCOSAL

## 2017-05-05 MED ORDER — DOCUSATE SODIUM 100 MG PO CAPS
200.0000 mg | ORAL_CAPSULE | Freq: Every day | ORAL | Status: DC
  Administered 2017-05-06 – 2017-05-07 (×2): 200 mg via ORAL
  Filled 2017-05-05 (×2): qty 2

## 2017-05-05 MED ORDER — LACTATED RINGERS IV SOLN: INTRAVENOUS | Status: DC

## 2017-05-05 MED ORDER — THROMBIN 20000 UNITS EX SOLR
CUTANEOUS | Status: AC
  Filled 2017-05-05: qty 20000

## 2017-05-05 MED ORDER — PROPOFOL 10 MG/ML IV BOLUS
INTRAVENOUS | Status: AC
  Filled 2017-05-05: qty 20

## 2017-05-05 MED ORDER — NITROGLYCERIN IN D5W 200-5 MCG/ML-% IV SOLN: 0.0000 ug/min | INTRAVENOUS | Status: DC

## 2017-05-05 MED ORDER — ACETAMINOPHEN 160 MG/5ML PO SOLN: 1000.0000 mg | Freq: Four times a day (QID) | ORAL | Status: DC

## 2017-05-05 MED ORDER — SODIUM CHLORIDE 0.9% FLUSH
3.0000 mL | Freq: Two times a day (BID) | INTRAVENOUS | Status: DC
  Administered 2017-05-06 – 2017-05-07 (×3): 3 mL via INTRAVENOUS

## 2017-05-05 MED ORDER — PROTAMINE SULFATE 10 MG/ML IV SOLN
INTRAVENOUS | Status: DC | PRN
  Administered 2017-05-05: 250 mg via INTRAVENOUS
  Administered 2017-05-05: 20 mg via INTRAVENOUS

## 2017-05-05 MED ORDER — CEFAZOLIN SODIUM-DEXTROSE 2-4 GM/100ML-% IV SOLN
2.0000 g | Freq: Three times a day (TID) | INTRAVENOUS | Status: AC
  Administered 2017-05-05 – 2017-05-07 (×6): 2 g via INTRAVENOUS
  Filled 2017-05-05 (×6): qty 100

## 2017-05-05 MED ORDER — LACTATED RINGERS IV SOLN
INTRAVENOUS | Status: DC | PRN
  Administered 2017-05-05: 07:00:00 via INTRAVENOUS

## 2017-05-05 MED ORDER — SODIUM CHLORIDE 0.9 % IV SOLN
0.0000 ug/kg/h | INTRAVENOUS | Status: DC
  Filled 2017-05-05: qty 2

## 2017-05-05 MED ORDER — CHLORHEXIDINE GLUCONATE 0.12 % MT SOLN
15.0000 mL | OROMUCOSAL | Status: AC
  Administered 2017-05-05: 15 mL via OROMUCOSAL

## 2017-05-05 MED ORDER — POTASSIUM CHLORIDE 10 MEQ/50ML IV SOLN
10.0000 meq | INTRAVENOUS | Status: AC
  Administered 2017-05-05 (×3): 10 meq via INTRAVENOUS

## 2017-05-05 MED ORDER — POTASSIUM CHLORIDE 10 MEQ/50ML IV SOLN
10.0000 meq | INTRAVENOUS | Status: AC
  Administered 2017-05-05 (×2): 10 meq via INTRAVENOUS
  Filled 2017-05-05 (×2): qty 50

## 2017-05-05 MED ORDER — ROCURONIUM BROMIDE 10 MG/ML (PF) SYRINGE
PREFILLED_SYRINGE | INTRAVENOUS | Status: AC
  Filled 2017-05-05: qty 5

## 2017-05-05 MED ORDER — ONDANSETRON HCL 4 MG/2ML IJ SOLN: 4.0000 mg | Freq: Four times a day (QID) | INTRAMUSCULAR | Status: DC | PRN

## 2017-05-05 MED ORDER — TRAMADOL HCL 50 MG PO TABS
50.0000 mg | ORAL_TABLET | ORAL | Status: DC | PRN
  Administered 2017-05-07: 50 mg via ORAL
  Administered 2017-05-07: 100 mg via ORAL
  Filled 2017-05-05: qty 1
  Filled 2017-05-05: qty 2

## 2017-05-05 MED ORDER — HEPARIN SODIUM (PORCINE) 1000 UNIT/ML IJ SOLN
INTRAMUSCULAR | Status: DC | PRN
  Administered 2017-05-05: 5 mL via INTRAVENOUS
  Administered 2017-05-05: 29 mL via INTRAVENOUS

## 2017-05-05 MED ORDER — SODIUM CHLORIDE 0.9 % IV SOLN: INTRAVENOUS | Status: DC

## 2017-05-05 MED ORDER — METOPROLOL TARTRATE 25 MG/10 ML ORAL SUSPENSION: 12.5000 mg | Freq: Two times a day (BID) | ORAL | Status: DC

## 2017-05-05 MED ORDER — PROPOFOL 10 MG/ML IV BOLUS
INTRAVENOUS | Status: DC | PRN
  Administered 2017-05-05: 20 mg via INTRAVENOUS

## 2017-05-05 MED ORDER — ARTIFICIAL TEARS OPHTHALMIC OINT
TOPICAL_OINTMENT | OPHTHALMIC | Status: DC | PRN
  Administered 2017-05-05: 1 via OPHTHALMIC

## 2017-05-05 MED ORDER — ACETAMINOPHEN 500 MG PO TABS
1000.0000 mg | ORAL_TABLET | Freq: Four times a day (QID) | ORAL | Status: DC
  Administered 2017-05-05 – 2017-05-07 (×6): 1000 mg via ORAL
  Filled 2017-05-05 (×7): qty 2

## 2017-05-05 MED ORDER — ACETAMINOPHEN 650 MG RE SUPP
650.0000 mg | Freq: Once | RECTAL | Status: AC
  Administered 2017-05-05: 650 mg via RECTAL

## 2017-05-05 MED ORDER — ARTIFICIAL TEARS OPHTHALMIC OINT
TOPICAL_OINTMENT | OPHTHALMIC | Status: AC
  Filled 2017-05-05: qty 3.5

## 2017-05-05 MED ORDER — MORPHINE SULFATE (PF) 4 MG/ML IV SOLN
2.0000 mg | INTRAVENOUS | Status: DC | PRN
  Administered 2017-05-05: 2 mg via INTRAVENOUS
  Administered 2017-05-06: 4 mg via INTRAVENOUS
  Administered 2017-05-06: 2 mg via INTRAVENOUS
  Filled 2017-05-05 (×3): qty 1

## 2017-05-05 MED ORDER — SODIUM CHLORIDE 0.9 % IV SOLN
INTRAVENOUS | Status: DC | PRN
  Administered 2017-05-05: 0.3 ug/kg/h via INTRAVENOUS

## 2017-05-05 MED ORDER — SODIUM CHLORIDE 0.9 % IJ SOLN
INTRAMUSCULAR | Status: AC
  Filled 2017-05-05: qty 10

## 2017-05-05 MED ORDER — DEXMEDETOMIDINE HCL IN NACL 200 MCG/50ML IV SOLN
INTRAVENOUS | Status: AC
  Filled 2017-05-05: qty 50

## 2017-05-05 MED ORDER — INSULIN REGULAR BOLUS VIA INFUSION
0.0000 [IU] | Freq: Three times a day (TID) | INTRAVENOUS | Status: DC
  Filled 2017-05-05: qty 10

## 2017-05-05 MED ORDER — MORPHINE SULFATE (PF) 4 MG/ML IV SOLN
1.0000 mg | INTRAVENOUS | Status: AC | PRN
  Administered 2017-05-05: 2 mg via INTRAVENOUS
  Filled 2017-05-05: qty 1

## 2017-05-05 MED ORDER — FENTANYL CITRATE (PF) 250 MCG/5ML IJ SOLN
INTRAMUSCULAR | Status: AC
  Filled 2017-05-05: qty 20

## 2017-05-05 MED ORDER — EPHEDRINE SULFATE 50 MG/ML IJ SOLN
INTRAMUSCULAR | Status: DC | PRN
  Administered 2017-05-05: 5 mg via INTRAVENOUS
  Administered 2017-05-05: 15 mg via INTRAVENOUS

## 2017-05-05 MED ORDER — FENTANYL CITRATE (PF) 250 MCG/5ML IJ SOLN
INTRAMUSCULAR | Status: AC
  Filled 2017-05-05: qty 10

## 2017-05-05 MED ORDER — PHENYLEPHRINE HCL 10 MG/ML IJ SOLN
INTRAMUSCULAR | Status: DC | PRN
  Administered 2017-05-05 (×2): 40 ug via INTRAVENOUS

## 2017-05-05 MED ORDER — METOPROLOL TARTRATE 12.5 MG HALF TABLET
12.5000 mg | ORAL_TABLET | Freq: Two times a day (BID) | ORAL | Status: DC
  Administered 2017-05-06 (×2): 12.5 mg via ORAL
  Filled 2017-05-05 (×3): qty 1

## 2017-05-05 MED ORDER — PHENYLEPHRINE HCL 10 MG/ML IJ SOLN
INTRAVENOUS | Status: DC | PRN
  Administered 2017-05-05: 20 ug/min via INTRAVENOUS

## 2017-05-05 MED ORDER — FAMOTIDINE IN NACL 20-0.9 MG/50ML-% IV SOLN
20.0000 mg | Freq: Two times a day (BID) | INTRAVENOUS | Status: AC
  Administered 2017-05-05: 20 mg via INTRAVENOUS

## 2017-05-05 MED ORDER — CHLORHEXIDINE GLUCONATE 0.12% ORAL RINSE (MEDLINE KIT)
15.0000 mL | Freq: Two times a day (BID) | OROMUCOSAL | Status: DC
  Administered 2017-05-05: 15 mL via OROMUCOSAL

## 2017-05-05 MED ORDER — SODIUM CHLORIDE 0.9% FLUSH: 3.0000 mL | INTRAVENOUS | Status: DC | PRN

## 2017-05-05 MED ORDER — SODIUM CHLORIDE 0.9 % IV SOLN
INTRAVENOUS | Status: DC | PRN
  Administered 2017-05-05: 750 mg via INTRAVENOUS

## 2017-05-05 MED ORDER — SODIUM CHLORIDE 0.9 % IV SOLN: Freq: Once | INTRAVENOUS | Status: DC

## 2017-05-05 MED ORDER — HEMOSTATIC AGENTS (NO CHARGE) OPTIME
TOPICAL | Status: DC | PRN
  Administered 2017-05-05: 1 via TOPICAL

## 2017-05-05 MED ORDER — ASPIRIN EC 325 MG PO TBEC
325.0000 mg | DELAYED_RELEASE_TABLET | Freq: Every day | ORAL | Status: DC
  Filled 2017-05-05: qty 1

## 2017-05-05 MED ORDER — ASPIRIN 81 MG PO CHEW: 324.0000 mg | CHEWABLE_TABLET | Freq: Every day | ORAL | Status: DC

## 2017-05-05 MED ORDER — SODIUM CHLORIDE 0.9 % IV SOLN
INTRAVENOUS | Status: DC | PRN
  Administered 2017-05-05: 1.6 [IU]/h via INTRAVENOUS

## 2017-05-05 MED ORDER — METOPROLOL TARTRATE 5 MG/5ML IV SOLN: 2.5000 mg | INTRAVENOUS | Status: DC | PRN

## 2017-05-05 MED ORDER — MORPHINE SULFATE (PF) 2 MG/ML IV SOLN: 2.0000 mg | INTRAVENOUS | Status: DC | PRN

## 2017-05-05 MED ORDER — ACETAMINOPHEN 160 MG/5ML PO SOLN: 650.0000 mg | Freq: Once | ORAL | Status: AC

## 2017-05-05 MED ORDER — MORPHINE SULFATE (PF) 2 MG/ML IV SOLN: 1.0000 mg | INTRAVENOUS | Status: DC | PRN

## 2017-05-05 MED ORDER — ROCURONIUM BROMIDE 100 MG/10ML IV SOLN
INTRAVENOUS | Status: DC | PRN
  Administered 2017-05-05 (×2): 50 mg via INTRAVENOUS
  Administered 2017-05-05: 100 mg via INTRAVENOUS
  Administered 2017-05-05 (×2): 50 mg via INTRAVENOUS

## 2017-05-05 MED ORDER — TRANEXAMIC ACID 1000 MG/10ML IV SOLN
INTRAVENOUS | Status: DC | PRN
  Administered 2017-05-05: 1.5 mg/kg/h via INTRAVENOUS

## 2017-05-05 MED ORDER — BISACODYL 10 MG RE SUPP: 10.0000 mg | Freq: Every day | RECTAL | Status: DC

## 2017-05-05 MED ORDER — VANCOMYCIN HCL IN DEXTROSE 1-5 GM/200ML-% IV SOLN
1000.0000 mg | Freq: Once | INTRAVENOUS | Status: AC
  Administered 2017-05-05: 1000 mg via INTRAVENOUS
  Filled 2017-05-05: qty 200

## 2017-05-05 MED ORDER — HEPARIN SODIUM (PORCINE) 1000 UNIT/ML IJ SOLN
INTRAMUSCULAR | Status: AC
Start: 2017-05-05 — End: 2017-05-05
  Filled 2017-05-05: qty 1

## 2017-05-05 MED ORDER — MAGNESIUM SULFATE 4 GM/100ML IV SOLN
4.0000 g | Freq: Once | INTRAVENOUS | Status: AC
  Administered 2017-05-05: 4 g via INTRAVENOUS
  Filled 2017-05-05: qty 100

## 2017-05-05 MED ORDER — SODIUM CHLORIDE 0.9 % IV SOLN
0.0000 ug/min | INTRAVENOUS | Status: DC
  Filled 2017-05-05: qty 2

## 2017-05-05 MED ORDER — THROMBIN (RECOMBINANT) 20000 UNITS EX SOLR
OROMUCOSAL | Status: DC | PRN
  Administered 2017-05-05 (×3): 4 mL via TOPICAL

## 2017-05-05 MED ORDER — SODIUM CHLORIDE 0.9 % IV SOLN
INTRAVENOUS | Status: DC
  Filled 2017-05-05: qty 1

## 2017-05-05 MED ORDER — PROTAMINE SULFATE 10 MG/ML IV SOLN
INTRAVENOUS | Status: AC
  Filled 2017-05-05: qty 25

## 2017-05-05 MED ORDER — MIDAZOLAM HCL 2 MG/2ML IJ SOLN: 2.0000 mg | INTRAMUSCULAR | Status: DC | PRN

## 2017-05-05 MED ORDER — OXYCODONE HCL 5 MG PO TABS
5.0000 mg | ORAL_TABLET | ORAL | Status: DC | PRN
  Administered 2017-05-06 (×2): 5 mg via ORAL
  Filled 2017-05-05 (×2): qty 1

## 2017-05-05 MED ORDER — CHLORHEXIDINE GLUCONATE 4 % EX LIQD: 30.0000 mL | CUTANEOUS | Status: DC

## 2017-05-05 MED ORDER — FENTANYL CITRATE (PF) 250 MCG/5ML IJ SOLN
INTRAMUSCULAR | Status: DC | PRN
  Administered 2017-05-05: 250 ug via INTRAVENOUS
  Administered 2017-05-05 (×2): 50 ug via INTRAVENOUS
  Administered 2017-05-05: 250 ug via INTRAVENOUS
  Administered 2017-05-05: 100 ug via INTRAVENOUS
  Administered 2017-05-05: 550 ug via INTRAVENOUS
  Administered 2017-05-05: 250 ug via INTRAVENOUS

## 2017-05-05 MED ORDER — ROCURONIUM BROMIDE 10 MG/ML (PF) SYRINGE
PREFILLED_SYRINGE | INTRAVENOUS | Status: AC
Start: 2017-05-05 — End: 2017-05-05
  Filled 2017-05-05: qty 5

## 2017-05-05 MED ORDER — BISACODYL 5 MG PO TBEC
10.0000 mg | DELAYED_RELEASE_TABLET | Freq: Every day | ORAL | Status: DC
  Administered 2017-05-06 – 2017-05-07 (×2): 10 mg via ORAL
  Filled 2017-05-05 (×2): qty 2

## 2017-05-05 SURGICAL SUPPLY — 128 items
ADAPTER CARDIO PERF ANTE/RETRO (ADAPTER) ×8 IMPLANT
BAG DECANTER FOR FLEXI CONT (MISCELLANEOUS) ×4 IMPLANT
BANDAGE ACE 4X5 VEL STRL LF (GAUZE/BANDAGES/DRESSINGS) ×4 IMPLANT
BANDAGE ACE 6X5 VEL STRL LF (GAUZE/BANDAGES/DRESSINGS) ×4 IMPLANT
BASKET HEART  (ORDER IN 25'S) (MISCELLANEOUS) ×1
BASKET HEART (ORDER IN 25'S) (MISCELLANEOUS) ×1
BASKET HEART (ORDER IN 25S) (MISCELLANEOUS) ×2 IMPLANT
BLADE STERNUM SYSTEM 6 (BLADE) ×4 IMPLANT
BLADE SURG 11 STRL SS (BLADE) ×4 IMPLANT
BLADE SURG 15 STRL LF DISP TIS (BLADE) ×2 IMPLANT
BLADE SURG 15 STRL SS (BLADE) ×2
BNDG GAUZE ELAST 4 BULKY (GAUZE/BANDAGES/DRESSINGS) ×4 IMPLANT
CANISTER SUCT 3000ML PPV (MISCELLANEOUS) ×4 IMPLANT
CANNULA GUNDRY RCSP 15FR (MISCELLANEOUS) ×8 IMPLANT
CATH HEART VENT LEFT (CATHETERS) ×2 IMPLANT
CATH ROBINSON RED A/P 18FR (CATHETERS) ×12 IMPLANT
CATH THORACIC 28FR (CATHETERS) ×4 IMPLANT
CATH THORACIC 36FR (CATHETERS) ×4 IMPLANT
CATH THORACIC 36FR RT ANG (CATHETERS) ×4 IMPLANT
CLIP VESOCCLUDE MED 24/CT (CLIP) ×4 IMPLANT
CLIP VESOCCLUDE SM WIDE 24/CT (CLIP) ×16 IMPLANT
CONN 3/8X3/8 GISH STERILE (MISCELLANEOUS) ×4 IMPLANT
CONN ST 1/4X3/8  BEN (MISCELLANEOUS) ×2
CONN ST 1/4X3/8 BEN (MISCELLANEOUS) ×2 IMPLANT
CONT SPEC 4OZ CLIKSEAL STRL BL (MISCELLANEOUS) ×4 IMPLANT
COVER SURGICAL LIGHT HANDLE (MISCELLANEOUS) ×4 IMPLANT
CRADLE DONUT ADULT HEAD (MISCELLANEOUS) ×4 IMPLANT
DERMABOND ADVANCED (GAUZE/BANDAGES/DRESSINGS) ×2
DERMABOND ADVANCED .7 DNX12 (GAUZE/BANDAGES/DRESSINGS) ×2 IMPLANT
DRAPE CARDIOVASCULAR INCISE (DRAPES) ×2
DRAPE SLUSH/WARMER DISC (DRAPES) ×4 IMPLANT
DRAPE SRG 135X102X78XABS (DRAPES) ×2 IMPLANT
DRSG COVADERM 4X10 (GAUZE/BANDAGES/DRESSINGS) ×4 IMPLANT
DRSG COVADERM 4X14 (GAUZE/BANDAGES/DRESSINGS) ×4 IMPLANT
ELECT CAUTERY BLADE 6.4 (BLADE) ×4 IMPLANT
ELECT REM PT RETURN 9FT ADLT (ELECTROSURGICAL) ×8
ELECTRODE REM PT RTRN 9FT ADLT (ELECTROSURGICAL) ×4 IMPLANT
FELT TEFLON 1X6 (MISCELLANEOUS) ×4 IMPLANT
GAUZE SPONGE 4X4 12PLY STRL (GAUZE/BANDAGES/DRESSINGS) ×8 IMPLANT
GLOVE BIO SURGEON STRL SZ 6 (GLOVE) IMPLANT
GLOVE BIO SURGEON STRL SZ 6.5 (GLOVE) ×12 IMPLANT
GLOVE BIO SURGEON STRL SZ7 (GLOVE) IMPLANT
GLOVE BIO SURGEON STRL SZ7.5 (GLOVE) IMPLANT
GLOVE BIO SURGEONS STRL SZ 6.5 (GLOVE) ×4
GLOVE BIOGEL PI IND STRL 6 (GLOVE) IMPLANT
GLOVE BIOGEL PI IND STRL 6.5 (GLOVE) ×6 IMPLANT
GLOVE BIOGEL PI IND STRL 7.0 (GLOVE) IMPLANT
GLOVE BIOGEL PI IND STRL 8.5 (GLOVE) ×6 IMPLANT
GLOVE BIOGEL PI INDICATOR 6 (GLOVE)
GLOVE BIOGEL PI INDICATOR 6.5 (GLOVE) ×6
GLOVE BIOGEL PI INDICATOR 7.0 (GLOVE)
GLOVE BIOGEL PI INDICATOR 8.5 (GLOVE) ×6
GLOVE ECLIPSE 8.0 STRL XLNG CF (GLOVE) ×24 IMPLANT
GLOVE EUDERMIC 7 POWDERFREE (GLOVE) ×8 IMPLANT
GLOVE ORTHO TXT STRL SZ7.5 (GLOVE) IMPLANT
GLOVE SURG SS PI 6.0 STRL IVOR (GLOVE) ×4 IMPLANT
GOWN STRL REUS W/ TWL LRG LVL3 (GOWN DISPOSABLE) ×8 IMPLANT
GOWN STRL REUS W/ TWL XL LVL3 (GOWN DISPOSABLE) ×2 IMPLANT
GOWN STRL REUS W/TWL 2XL LVL3 (GOWN DISPOSABLE) ×24 IMPLANT
GOWN STRL REUS W/TWL LRG LVL3 (GOWN DISPOSABLE) ×8
GOWN STRL REUS W/TWL XL LVL3 (GOWN DISPOSABLE) ×2
HEART VENT LT CURVED (MISCELLANEOUS) ×4 IMPLANT
HEMOSTAT POWDER SURGIFOAM 1G (HEMOSTASIS) ×12 IMPLANT
HEMOSTAT SURGICEL 2X14 (HEMOSTASIS) ×4 IMPLANT
INSERT FOGARTY 61MM (MISCELLANEOUS) ×4 IMPLANT
INSERT FOGARTY XLG (MISCELLANEOUS) IMPLANT
IV CATH 18G X1.75 CATHLON (IV SOLUTION) ×4 IMPLANT
KIT BASIN OR (CUSTOM PROCEDURE TRAY) ×4 IMPLANT
KIT CATH CPB BARTLE (MISCELLANEOUS) ×4 IMPLANT
KIT ROOM TURNOVER OR (KITS) ×4 IMPLANT
KIT SUCTION CATH 14FR (SUCTIONS) ×4 IMPLANT
KIT VASOVIEW HEMOPRO VH 3000 (KITS) ×4 IMPLANT
LINE VENT (MISCELLANEOUS) ×4 IMPLANT
NS IRRIG 1000ML POUR BTL (IV SOLUTION) ×28 IMPLANT
PACK E OPEN HEART (SUTURE) ×4 IMPLANT
PACK OPEN HEART (CUSTOM PROCEDURE TRAY) ×4 IMPLANT
PAD ARMBOARD 7.5X6 YLW CONV (MISCELLANEOUS) ×12 IMPLANT
PAD ELECT DEFIB RADIOL ZOLL (MISCELLANEOUS) ×4 IMPLANT
PENCIL BUTTON HOLSTER BLD 10FT (ELECTRODE) ×4 IMPLANT
PUNCH AORTIC ROTATE 4.0MM (MISCELLANEOUS) IMPLANT
PUNCH AORTIC ROTATE 4.5MM 8IN (MISCELLANEOUS) ×4 IMPLANT
PUNCH AORTIC ROTATE 5MM 8IN (MISCELLANEOUS) IMPLANT
SET CARDIOPLEGIA MPS 5001102 (MISCELLANEOUS) ×4 IMPLANT
SOLUTION ANTI FOG 6CC (MISCELLANEOUS) ×4 IMPLANT
SPONGE INTESTINAL PEANUT (DISPOSABLE) ×4 IMPLANT
SPONGE LAP 18X18 X RAY DECT (DISPOSABLE) ×8 IMPLANT
SPONGE LAP 4X18 X RAY DECT (DISPOSABLE) IMPLANT
SUT BONE WAX W31G (SUTURE) ×4 IMPLANT
SUT ETHIBON 2 0 V 52N 30 (SUTURE) ×8 IMPLANT
SUT ETHIBOND 2 0 SH (SUTURE) ×10
SUT ETHIBOND 2 0 SH 36X2 (SUTURE) ×10 IMPLANT
SUT MNCRL AB 4-0 PS2 18 (SUTURE) ×4 IMPLANT
SUT PROLENE 3 0 SH DA (SUTURE) IMPLANT
SUT PROLENE 3 0 SH1 36 (SUTURE) ×4 IMPLANT
SUT PROLENE 4 0 RB 1 (SUTURE) ×8
SUT PROLENE 4 0 SH DA (SUTURE) IMPLANT
SUT PROLENE 4-0 RB1 .5 CRCL 36 (SUTURE) ×8 IMPLANT
SUT PROLENE 5 0 C 1 36 (SUTURE) IMPLANT
SUT PROLENE 6 0 C 1 30 (SUTURE) ×8 IMPLANT
SUT PROLENE 7 0 BV 1 (SUTURE) ×8 IMPLANT
SUT PROLENE 7 0 BV1 MDA (SUTURE) ×8 IMPLANT
SUT PROLENE 8 0 BV175 6 (SUTURE) ×8 IMPLANT
SUT SILK  1 MH (SUTURE) ×6
SUT SILK 1 MH (SUTURE) ×6 IMPLANT
SUT SILK 2 0 SH CR/8 (SUTURE) ×4 IMPLANT
SUT STEEL 6MS V (SUTURE) IMPLANT
SUT STEEL STERNAL CCS#1 18IN (SUTURE) IMPLANT
SUT STEEL SZ 6 DBL 3X14 BALL (SUTURE) ×12 IMPLANT
SUT VIC AB 1 CTX 36 (SUTURE) ×4
SUT VIC AB 1 CTX36XBRD ANBCTR (SUTURE) ×4 IMPLANT
SUT VIC AB 2-0 CT1 27 (SUTURE) ×2
SUT VIC AB 2-0 CT1 TAPERPNT 27 (SUTURE) ×2 IMPLANT
SUT VIC AB 2-0 CTX 27 (SUTURE) ×8 IMPLANT
SUT VIC AB 3-0 SH 27 (SUTURE)
SUT VIC AB 3-0 SH 27X BRD (SUTURE) IMPLANT
SUT VIC AB 3-0 X1 27 (SUTURE) IMPLANT
SUT VICRYL 4-0 PS2 18IN ABS (SUTURE) IMPLANT
SYSTEM SAHARA CHEST DRAIN ATS (WOUND CARE) ×4 IMPLANT
TAPE CLOTH SURG 4X10 WHT LF (GAUZE/BANDAGES/DRESSINGS) ×4 IMPLANT
TAPE PAPER 2X10 WHT MICROPORE (GAUZE/BANDAGES/DRESSINGS) ×4 IMPLANT
TOWEL GREEN STERILE (TOWEL DISPOSABLE) ×4 IMPLANT
TOWEL GREEN STERILE FF (TOWEL DISPOSABLE) ×4 IMPLANT
TRAY FOLEY SILVER 16FR TEMP (SET/KITS/TRAYS/PACK) ×4 IMPLANT
TUBING INSUFFLATION (TUBING) ×4 IMPLANT
UNDERPAD 30X30 (UNDERPADS AND DIAPERS) ×4 IMPLANT
VALVE MAGNA EASE AORTIC 23MM (Prosthesis & Implant Heart) ×4 IMPLANT
VENT LEFT HEART 12002 (CATHETERS) ×4
WATER STERILE IRR 1000ML POUR (IV SOLUTION) ×8 IMPLANT

## 2017-05-05 NOTE — Interval H&P Note (Signed)
History and Physical Interval Note:  05/05/2017 5:45 AM  John [name]  has presented today for surgery, with the diagnosis of CAD AS  The various methods of treatment have been discussed with the patient and family. After consideration of risks, benefits and other options for treatment, the patient has consented to  Procedure(s): AORTIC VALVE REPLACEMENT (AVR) (N/A) CORONARY ARTERY BYPASS GRAFTING (CABG) (N/A) TRANSESOPHAGEAL ECHOCARDIOGRAM (TEE) (N/A) as a surgical intervention .  The patient's history has been reviewed, patient examined, no change in status, stable for surgery.  I have reviewed the patient's chart and labs.  Questions were answered to the patient's satisfaction.     Gaye Pollack

## 2017-05-05 NOTE — Op Note (Signed)
CARDIOVASCULAR SURGERY OPERATIVE NOTE  05/05/2017  Surgeon:  Gaye Pollack, MD  First Assistant: Nicholes Rough,  PA-C   Preoperative Diagnosis:  Severe multi-vessel coronary artery disease and severe aortic stenosis.   Postoperative Diagnosis:  Same   Procedure:  1. Median Sternotomy 2. Extracorporeal circulation 3.   Coronary artery bypass grafting x 4   Left internal mammary graft to the LAD  SVG to Ramus  SVG to OM  SVG to PDA 4.   Endoscopic vein harvest from the right leg 5.   Aortic valve replacement using a 23 mm Edwards Magna-Ease pericardial valve   Anesthesia:  General Endotracheal   Clinical History/Surgical Indication:  The patient is a 77 year old active gentleman with history of diabetes, hyperlipidemia, hypertension, and aortic stenosis that has been followed by Dr.Paraschos. His last echocardiogram on 12/28/2016 showed a mean aortic transvalvular gradient of 37 mmHg with a peak gradient of 63.4 mmHg. Left ventricular ejection fraction was 55%. There is mild tricuspid and mild mitral valve insufficiency. He now presents with a 48-month history of exertional chest discomfort and shortness of breath which has been occurring with activities such as walking up a hill or up the steps. He has not had these symptoms with normal daily activities around the house or walking on level ground. He has had no symptoms at rest. He has had no dizziness or syncope. He denies any orthopnea or PND. He has had no peripheral edema. He underwent cardiac catheterization on 04/21/2017 which showed severe three-vessel coronary disease with 70% proximal LAD stenosis, 80% ostial left circumflex stenosis, 90% ostial ramus stenosis, and 75% proximal PDA stenosis. Aortic valve could not be crossed. There is mild pulmonary hypertension at 45/27.   He has stage D, severe, symptomatic aortic stenosis and  severe multivessel coronary artery disease with New York Heart Association functional class II symptoms of exertional fatigue and shortness of breath consistent with chronic diastolic heart failure as well as stable angina. I agree that coronary bypass graft surgery and aortic valve replacement is indicated in this patient. I recommended using a bioprosthetic valve given his age of 82. I reviewed the echocardiogram and catheterization findings with him and his family and answered all of their questions. I discussed the operative procedure with the patient and family including alternatives, benefits and risks; including but not limited to bleeding, blood transfusion, infection, stroke, myocardial infarction, graft failure, heart block requiring a permanent pacemaker, organ dysfunction, and death. John [name] understands and agrees to proceed.    Preparation:  The patient was seen in the preoperative holding area and the correct patient, correct operation were confirmed with the patient after reviewing the medical record and catheterization. The consent was signed by me. Preoperative antibiotics were given. A pulmonary arterial line and radial arterial line were placed by the anesthesia team. The patient was taken back to the operating room and positioned supine on the operating room table. After being placed under general endotracheal anesthesia by the anesthesia team a foley catheter was placed. The neck, chest, abdomen, and both legs were prepped with betadine soap and solution and draped in the usual sterile manner. A surgical time-out was taken and the correct patient and operative procedure were confirmed with the nursing and anesthesia staff.   Cardiopulmonary Bypass:  A median sternotomy was performed. The pericardium was opened in the midline. Right ventricular function appeared normal. The ascending aorta was of normal size and had no palpable plaque. There were no contraindications to aortic  cannulation  or cross-clamping. The patient was fully systemically heparinized and the ACT was maintained > 400 sec. The proximal aortic arch was cannulated with a 20 F aortic cannula for arterial inflow. Venous cannulation was performed via the right atrial appendage using a two-staged venous cannula. An antegrade cardioplegia/vent cannula was inserted into the mid-ascending aorta. A left ventricular vent was placed via the right superior pulmonary vein.  A retrograde cardioplegia cannula was inserted into the coronary sinus via the right atrium. Aortic occlusion was performed with a single cross-clamp. Systemic cooling to 32 degrees Centigrade and topical cooling of the heart with iced saline were used. Hyperkalemic antegrade cold blood cardioplegia was used to induce diastolic arrest and was then given at about 20 minute intervals throughout the period of arrest to maintain myocardial temperature at or below 10 degrees centigrade.  Cold blood retrograde cardioplegia was given throughout the period of aortic valve replacement. A temperature probe was inserted into the interventricular septum and an insulating pad was placed in the pericardium.   Left internal mammary harvest:  The left side of the sternum was retracted using the Rultract retractor. The left internal mammary artery was harvested as a pedicle graft. All side branches were clipped. It was a medium-sized vessel of good quality with excellent blood flow. It was ligated distally and divided. It was sprayed with topical papaverine solution to prevent vasospasm.   Endoscopic vein harvest:  The right greater saphenous vein was harvested endoscopically through a 2 cm incision medial to the right knee. It was harvested from the upper thigh to below the knee. It was a medium-sized vein of good quality. The side branches were all ligated with 4-0 silk ties.    Coronary arteries:  The coronary arteries were examined.   LAD:  Large vessel with  mild distal disease  LCX:  Moderate sized Ramus with heavy proximal disease and then became intramyocardial.  Moderate sized obtuse marginal branch with no distal disease.  RCA: Diffusely diseased with plaque.  The posterior descending artery was a large vessel with no distal disease.   Grafts:  1. LIMA to the LAD: 2.0 mm. It was sewn end to side using 8-0 prolene continuous suture. 2. SVG to Ramus:  1.6 mm. It was sewn end to side using 7-0 prolene continuous suture. 3. SVG to OM:  1.75 mm. It was sewn end to side using 7-0 prolene continuous suture. 4. SVG to PDA:  1.75 mm. It was sewn end to side using 7-0 prolene continuous suture.  The proximal vein graft anastomoses were performed to the mid-ascending aorta using continuous 6-0 prolene suture. Graft markers were placed around the proximal anastomoses.    Aortic Valve Replacement:   A transverse aortotomy was performed 1 cm above the take-off of the right coronary artery. There was significant calcified plaque throughout the walls of the aortic root.  The native valve was tricuspid with calcified leaflets and severe annular calcification. The ostia of the coronary arteries were in normal position and were not obstructed. The native valve leaflets were excised and the annulus was decalcified with rongeurs. Care was taken to remove all particulate debris. The left ventricle was directly inspected for debris and then irrigated with ice saline solution. The annulus was sized and a size 23 mm [employer] Ease pericardial valve was chosen. The model number was [id number] and the serial number was [id number].  While the valve was being prepared 2-0 Ethibond pledgeted horizontal mattress sutures were placed around the annulus with  the pledgets in a sub-annular position. The sutures were placed through the sewing ring and the valve lowered into place. The sutures were tied sequentially. The valve seated nicely and the coronary ostia were not obstructed.  The prosthetic valve leaflets moved normally and there was no sub-valvular obstruction. The aortotomy was closed using 4-0 Prolene suture in 2 layers with felt strips to reinforce the closure.  Completion:  The patient was rewarmed to 37 degrees Centigrade. The clamp was removed from the LIMA pedicle and there was rapid warming of the septum and return of ventricular fibrillation. The crossclamp was removed with a time of 159 minutes. There was spontaneous return of sinus rhythm. The distal and proximal anastomoses were checked for hemostasis. The position of the grafts was satisfactory. Two temporary epicardial pacing wires were placed on the right atrium and two on the right ventricle. The patient was weaned from CPB without difficulty on no inotropes. CPB time was 198 minutes. Cardiac output was 5 LPM. TEE showed a normally functioning bioprosthetic valve. There was a question of a trivial perivalvular leak along the right coronary annulus in an area of heavy calcification.  I did not feel that this was significant and certainly not worth trying to find with the degree of annular and sinus wall calcification in this area.  Left ventricular function appeared normal.  There is trivial mitral regurgitation.  Heparin was fully reversed with protamine and the aortic and venous cannulas removed. Hemostasis was achieved. Mediastinal and left pleural drainage tubes were placed. The sternum was closed with double #6 stainless steel wires. The fascia was closed with continuous # 1 vicryl suture. The subcutaneous tissue was closed with 2-0 vicryl continuous suture. The skin was closed with 3-0 vicryl subcuticular suture. All sponge, needle, and instrument counts were reported correct at the end of the case. Dry sterile dressings were placed over the incisions and around the chest tubes which were connected to pleurevac suction. The patient was then transported to the surgical intensive care unit in stable  condition.

## 2017-05-05 NOTE — Brief Op Note (Signed)
05/05/2017  8:53 AM  PATIENT:  [name]  77 y.o. male  PRE-OPERATIVE DIAGNOSIS:  CAD AS  POST-OPERATIVE DIAGNOSIS:  CAD AS  PROCEDURE:  Procedure(s): AORTIC VALVE REPLACEMENT (AVR) USING MAGNA EASE PERICARDIAL BIOPROSTHESIS - AORTIC VALVE MODEL 3300TFX, SIZE 23 MM (N/A) CORONARY ARTERY BYPASS GRAFTING (CABG) x 4 WITH ENDOSCOPIC HARVESTING OF RIGHT SAPHENOUS VEIN (N/A) TRANSESOPHAGEAL ECHOCARDIOGRAM (TEE) (N/A)  SURGEON:  Surgeon(s) and Role:    * Bartle, Fernande Boyden, MD - Primary  PHYSICIAN ASSISTANT:  Nicholes Rough, PA-C   ANESTHESIA:   general  EBL:  975 mL   BLOOD ADMINISTERED: NONE  DRAINS: ROUTINE   LOCAL MEDICATIONS USED:  NONE  SPECIMEN:  Source of Specimen:  AORTIC VALVE LEAFLETS  DISPOSITION OF SPECIMEN:  PATHOLOGY  COUNTS:  YES  DICTATION: .Dragon Dictation  PLAN OF CARE: Admit to inpatient   PATIENT DISPOSITION:  ICU - intubated and hemodynamically stable.   Delay start of Pharmacological VTE agent (>24hrs) due to surgical blood loss or risk of bleeding: yes

## 2017-05-05 NOTE — Plan of Care (Signed)
Progressing Education: Knowledge of General Education information will improve 05/05/2017 1747 - Progressing by Terrill Mohr, RN 05/05/2017 1746 - Progressing by Terrill Mohr, RN Health Behavior/Discharge Planning: Ability to manage health-related needs will improve 05/05/2017 1747 - Progressing by Terrill Mohr, RN 05/05/2017 1746 - Progressing by Terrill Mohr, RN Clinical Measurements: Ability to maintain clinical measurements within normal limits will improve 05/05/2017 1747 - Progressing by Terrill Mohr, RN 05/05/2017 1746 - Progressing by Terrill Mohr, RN Will remain free from infection 05/05/2017 1747 - Progressing by Terrill Mohr, RN 05/05/2017 1746 - Progressing by Terrill Mohr, RN Diagnostic test results will improve 05/05/2017 1747 - Progressing by Terrill Mohr, RN 05/05/2017 1746 - Progressing by Terrill Mohr, RN Respiratory complications will improve 05/05/2017 1747 - Progressing by Terrill Mohr, RN 05/05/2017 1746 - Progressing by Terrill Mohr, RN Cardiovascular complication will be avoided 05/05/2017 1747 - Progressing by Terrill Mohr, RN 05/05/2017 1746 - Progressing by Terrill Mohr, RN Activity: Risk for activity intolerance will decrease 05/05/2017 1747 - Progressing by Terrill Mohr, RN 05/05/2017 1746 - Progressing by Terrill Mohr, RN Nutrition: Adequate nutrition will be maintained 05/05/2017 1747 - Progressing by Terrill Mohr, RN 05/05/2017 1746 - Progressing by Terrill Mohr, RN Coping: Level of anxiety will decrease 05/05/2017 1747 - Progressing by Terrill Mohr, RN 05/05/2017 1746 - Progressing by Terrill Mohr, RN Elimination: Will not experience complications related to bowel motility 05/05/2017 1747 - Progressing by Terrill Mohr, RN 05/05/2017 1746 - Progressing by Terrill Mohr, RN Will not experience complications related to urinary retention 05/05/2017 1747 - Progressing by Terrill Mohr, RN 05/05/2017 1746 - Progressing by  Terrill Mohr, RN Pain Managment: General experience of comfort will improve 05/05/2017 1747 - Progressing by Terrill Mohr, RN 05/05/2017 1746 - Progressing by Terrill Mohr, RN Safety: Ability to remain free from injury will improve 05/05/2017 1747 - Progressing by Terrill Mohr, RN 05/05/2017 1746 - Progressing by Terrill Mohr, RN Skin Integrity: Risk for impaired skin integrity will decrease 05/05/2017 1747 - Progressing by Terrill Mohr, RN 05/05/2017 1746 - Progressing by Terrill Mohr, RN Education: Ability to demonstrate proper wound care will improve 05/05/2017 1747 - Progressing by Terrill Mohr, RN 05/05/2017 1746 - Progressing by Terrill Mohr, RN Knowledge of disease or condition will improve 05/05/2017 1747 - Progressing by Terrill Mohr, RN 05/05/2017 1746 - Progressing by Terrill Mohr, RN Knowledge of the prescribed therapeutic regimen will improve 05/05/2017 1747 - Progressing by Terrill Mohr, RN 05/05/2017 1746 - Progressing by Terrill Mohr, RN Activity: Risk for activity intolerance will decrease 05/05/2017 1747 - Progressing by Terrill Mohr, RN 05/05/2017 1746 - Progressing by Terrill Mohr, RN Cardiac: Hemodynamic stability will improve 05/05/2017 1747 - Progressing by Terrill Mohr, RN 05/05/2017 1746 - Progressing by Terrill Mohr, RN Clinical Measurements: Postoperative complications will be avoided or minimized 05/05/2017 1747 - Progressing by Terrill Mohr, RN 05/05/2017 1746 - Progressing by Terrill Mohr, RN Respiratory: Respiratory status will improve 05/05/2017 1747 - Progressing by Terrill Mohr, RN 05/05/2017 1746 - Progressing by Terrill Mohr, RN Skin Integrity: Wound healing without signs and symptoms of infection 05/05/2017 1747 - Progressing by Terrill Mohr, RN 05/05/2017 1746 - Progressing by Terrill Mohr, RN Risk for impaired skin integrity will decrease 05/05/2017 1747 - Progressing by Slusher,  Georganna Skeans, RN 05/05/2017 1746 -  Progressing by Terrill Mohr, RN Urinary Elimination: Ability to achieve and maintain adequate renal perfusion and functioning will improve 05/05/2017 1747 - Progressing by Terrill Mohr, RN 05/05/2017 1746 - Progressing by Terrill Mohr, RN

## 2017-05-05 NOTE — Progress Notes (Signed)
Echocardiogram Echocardiogram Transesophageal has been performed.  John [name] 05/05/2017, 9:32 AM

## 2017-05-05 NOTE — Plan of Care (Signed)
  Progressing Cardiac: Hemodynamic stability will improve 05/05/2017 2135 - Progressing by Netta Corrigan, RN Note Pt remains hemodynamically stable the in immediate post operative period.  Respiratory: Respiratory status will improve 05/05/2017 2135 - Progressing by Netta Corrigan, RN Note Pt was extubated in 6 hours, although he can only get incentive spirometer up to 250. RN continues to educate and encourage patient while doing this.  Skin Integrity: Risk for impaired skin integrity will decrease 05/05/2017 2135 - Progressing by Netta Corrigan, RN Note Pt has a sacral foam. It is clean, dry, and intact.  Urinary Elimination: Ability to achieve and maintain adequate renal perfusion and functioning will improve 05/05/2017 2135 - Progressing by Netta Corrigan, RN Note Pt has made adequate UOP in the immediate post operative period.

## 2017-05-05 NOTE — Anesthesia Procedure Notes (Signed)
Central Venous Catheter Insertion Performed by: Oleta Mouse, MD, anesthesiologist Start/End3/08/2017 7:01 AM, 05/05/2017 7:06 AM Patient location: Pre-op. Preanesthetic checklist: patient identified, IV checked, site marked, risks and benefits discussed, surgical consent, monitors and equipment checked, pre-op evaluation, timeout performed and anesthesia consent Hand hygiene performed  and maximum sterile barriers used  PA cath was placed.Swan type:thermodilution Procedure performed without using ultrasound guided technique. Attempts: 1 Patient tolerated the procedure well with no immediate complications.

## 2017-05-05 NOTE — Procedures (Signed)
Extubation Procedure Note  Patient Details:   Name: John [name] DOB: 07-06-1940 MRN: [id number]   Airway Documentation:  Airway 8 mm (Active)  Secured at (cm) 22 cm 05/05/2017  7:35 PM  Measured From Lips 05/05/2017  7:35 PM  Secured Location Right 05/05/2017  7:35 PM  Secured By Pink Tape 05/05/2017  7:35 PM  Site Condition Dry 05/05/2017  7:35 PM    Evaluation  O2 sats: stable throughout Complications: No apparent complications Patient did tolerate procedure well. Bilateral Breath Sounds: Clear   Yes   Pulmonary mechanics done with great pt effort prior to extubation. NIF-26, VC-761ml, with positive cuff leak. Pt ext to 4L nasal cannula and able to speak after extubation, voice just hoarse. RN at bedside with IS. RT to cont to monitor.   Eldean Nanna F 05/05/2017, 9:00 PM

## 2017-05-05 NOTE — Anesthesia Procedure Notes (Signed)
Arterial Line Insertion Start/End3/08/2017 6:50 AM, 05/05/2017 6:55 AM Performed by: Leonor Liv, CRNA, CRNA  Patient location: Pre-op. Preanesthetic checklist: patient identified, IV checked, site marked, risks and benefits discussed, surgical consent, monitors and equipment checked, pre-op evaluation, timeout performed and anesthesia consent Left, radial was placed Catheter size: 20 G Hand hygiene performed  and maximum sterile barriers used  Allen's test indicative of satisfactory collateral circulation Attempts: 1 Procedure performed without using ultrasound guided technique. Following insertion, Biopatch and dressing applied. Post procedure assessment: normal  Patient tolerated the procedure well with no immediate complications.

## 2017-05-05 NOTE — Transfer of Care (Signed)
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 - AORTIC VALVE MODEL 3300TFX, SIZE 23 MM (N/A Chest) CORONARY ARTERY BYPASS GRAFTING (CABG) x 4 WITH ENDOSCOPIC HARVESTING OF RIGHT SAPHENOUS VEIN (N/A Chest) TRANSESOPHAGEAL ECHOCARDIOGRAM (TEE) (N/A )  Patient Location: SICU  Anesthesia Type:General  Level of Consciousness: sedated and Patient remains intubated per anesthesia plan  Airway & Oxygen Therapy: Patient remains intubated per anesthesia plan and Patient placed on Ventilator (see vital sign flow sheet for setting)  Post-op Assessment: Report given to RN and Post -op Vital signs reviewed and stable  Post vital signs: Reviewed and stable  Last Vitals:  Vitals:   05/05/17 0546 05/05/17 0547  BP:  (!) 145/78  Pulse: 81   Resp: 20   Temp: 36.8 C   SpO2: 96%     Last Pain:  Vitals:   05/05/17 0616  TempSrc:   PainSc: 0-No pain      Patients Stated Pain Goal: 7 (34/28/76 8115)  Complications: No apparent anesthesia complications

## 2017-05-05 NOTE — Anesthesia Procedure Notes (Signed)
Central Venous Catheter Insertion Performed by: Oleta Mouse, MD, anesthesiologist Start/End3/08/2017 7:01 AM, 05/05/2017 7:05 AM Patient location: Pre-op. Preanesthetic checklist: patient identified, IV checked, site marked, risks and benefits discussed, surgical consent, monitors and equipment checked, pre-op evaluation, timeout performed and anesthesia consent Lidocaine 1% used for infiltration and patient sedated Hand hygiene performed  and maximum sterile barriers used  Catheter size: 8.5 Fr Total catheter length 10. Sheath introducer Procedure performed using ultrasound guided technique. Ultrasound Notes:anatomy identified, needle tip was noted to be adjacent to the nerve/plexus identified, no ultrasound evidence of intravascular and/or intraneural injection and image(s) printed for medical record Attempts: 1 Following insertion, line sutured, dressing applied and Biopatch. Post procedure assessment: blood return through all ports, free fluid flow and no air  Patient tolerated the procedure well with no immediate complications.

## 2017-05-05 NOTE — Anesthesia Procedure Notes (Signed)
Procedure Name: Intubation Date/Time: 05/05/2017 7:50 AM Performed by: Leonor Liv, CRNA Pre-anesthesia Checklist: Patient identified, Emergency Drugs available, Suction available and Patient being monitored Patient Re-evaluated:Patient Re-evaluated prior to induction Oxygen Delivery Method: Circle System Utilized Preoxygenation: Pre-oxygenation with 100% oxygen Induction Type: IV induction Ventilation: Mask ventilation without difficulty Laryngoscope Size: Mac and 4 Grade View: Grade I Tube type: Subglottic suction tube Tube size: 8.0 mm Number of attempts: 1 Airway Equipment and Method: Stylet and Oral airway Placement Confirmation: ETT inserted through vocal cords under direct vision,  positive ETCO2 and breath sounds checked- equal and bilateral Secured at: 23 cm Tube secured with: Tape Dental Injury: Teeth and Oropharynx as per pre-operative assessment

## 2017-05-05 NOTE — Progress Notes (Signed)
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  Supplement potassium  Rexene Alberts, MD 05/05/2017 7:18 PM

## 2017-05-06 ENCOUNTER — Inpatient Hospital Stay (HOSPITAL_COMMUNITY): Payer: PPO

## 2017-05-06 ENCOUNTER — Encounter (HOSPITAL_COMMUNITY): Payer: Self-pay | Admitting: Surgery

## 2017-05-06 LAB — GLUCOSE, CAPILLARY
GLUCOSE-CAPILLARY: 125 mg/dL — AB (ref 65–99)
GLUCOSE-CAPILLARY: 116 mg/dL — AB (ref 65–99)
GLUCOSE-CAPILLARY: 114 mg/dL — AB (ref 65–99)
GLUCOSE-CAPILLARY: 154 mg/dL — AB (ref 65–99)
GLUCOSE-CAPILLARY: 127 mg/dL — AB (ref 65–99)
GLUCOSE-CAPILLARY: 117 mg/dL — AB (ref 65–99)
Glucose-Capillary: 140 mg/dL — ABNORMAL HIGH (ref 65–99)
Glucose-Capillary: 130 mg/dL — ABNORMAL HIGH (ref 65–99)
Glucose-Capillary: 100 mg/dL — ABNORMAL HIGH (ref 65–99)
Glucose-Capillary: 116 mg/dL — ABNORMAL HIGH (ref 65–99)
Glucose-Capillary: 124 mg/dL — ABNORMAL HIGH (ref 65–99)
Glucose-Capillary: 132 mg/dL — ABNORMAL HIGH (ref 65–99)
Glucose-Capillary: 119 mg/dL — ABNORMAL HIGH (ref 65–99)
Glucose-Capillary: 117 mg/dL — ABNORMAL HIGH (ref 65–99)
Glucose-Capillary: 218 mg/dL — ABNORMAL HIGH (ref 65–99)
Glucose-Capillary: 141 mg/dL — ABNORMAL HIGH (ref 65–99)
Glucose-Capillary: 266 mg/dL — ABNORMAL HIGH (ref 65–99)

## 2017-05-06 LAB — POCT I-STAT, CHEM 8
BUN: 24 mg/dL — ABNORMAL HIGH (ref 6–20)
CHLORIDE: 99 mmol/L — AB (ref 101–111)
CREATININE: 1.2 mg/dL (ref 0.61–1.24)
Calcium, Ion: 1.06 mmol/L — ABNORMAL LOW (ref 1.15–1.40)
GLUCOSE: 138 mg/dL — AB (ref 65–99)
HCT: 28 % — ABNORMAL LOW (ref 39.0–52.0)
HEMOGLOBIN: 9.5 g/dL — AB (ref 13.0–17.0)
POTASSIUM: 4.1 mmol/L (ref 3.5–5.1)
Sodium: 139 mmol/L (ref 135–145)
TCO2: 27 mmol/L (ref 22–32)

## 2017-05-06 LAB — BASIC METABOLIC PANEL
ANION GAP: 10 (ref 5–15)
BUN: 20 mg/dL (ref 6–20)
CHLORIDE: 106 mmol/L (ref 101–111)
CO2: 25 mmol/L (ref 22–32)
Calcium: 7.6 mg/dL — ABNORMAL LOW (ref 8.9–10.3)
Creatinine, Ser: 1.03 mg/dL (ref 0.61–1.24)
GFR calc non Af Amer: >60 mL/min (ref >60)
Glucose, Bld: 110 mg/dL — ABNORMAL HIGH (ref 65–99)
Potassium: 3.9 mmol/L (ref 3.5–5.1)
Sodium: 141 mmol/L (ref 135–145)

## 2017-05-06 LAB — CBC
HEMATOCRIT: 27.3 % — AB (ref 39.0–52.0)
HEMOGLOBIN: 8.2 g/dL — AB (ref 13.0–17.0)
MCH: 24.9 pg — ABNORMAL LOW (ref 26.0–34.0)
MCHC: 30 g/dL (ref 30.0–36.0)
MCV: 83 fL (ref 78.0–100.0)
Platelets: 165 10*3/uL (ref 150–400)
RBC: 3.29 MIL/uL — ABNORMAL LOW (ref 4.22–5.81)
RDW: 15.5 % (ref 11.5–15.5)
WBC: 15 10*3/uL — AB (ref 4.0–10.5)

## 2017-05-06 LAB — MAGNESIUM: Magnesium: 2.8 mg/dL — ABNORMAL HIGH (ref 1.7–2.4)

## 2017-05-06 MED ORDER — INSULIN DETEMIR 100 UNIT/ML ~~LOC~~ SOLN
30.0000 [IU] | Freq: Every day | SUBCUTANEOUS | Status: DC
  Administered 2017-05-07 – 2017-05-08 (×2): 30 [IU] via SUBCUTANEOUS
  Filled 2017-05-06 (×3): qty 0.3

## 2017-05-06 MED ORDER — INSULIN ASPART 100 UNIT/ML ~~LOC~~ SOLN
0.0000 [IU] | SUBCUTANEOUS | Status: DC
  Administered 2017-05-06: 2 [IU] via SUBCUTANEOUS
  Administered 2017-05-06: 8 [IU] via SUBCUTANEOUS
  Administered 2017-05-06: 12 [IU] via SUBCUTANEOUS
  Administered 2017-05-07: 2 [IU] via SUBCUTANEOUS

## 2017-05-06 MED ORDER — INSULIN DETEMIR 100 UNIT/ML ~~LOC~~ SOLN
30.0000 [IU] | Freq: Every day | SUBCUTANEOUS | Status: DC
  Filled 2017-05-06: qty 0.3

## 2017-05-06 MED ORDER — POTASSIUM CHLORIDE CRYS ER 20 MEQ PO TBCR
20.0000 meq | EXTENDED_RELEASE_TABLET | Freq: Two times a day (BID) | ORAL | Status: AC
  Administered 2017-05-06 (×2): 20 meq via ORAL
  Filled 2017-05-06 (×2): qty 1

## 2017-05-06 MED ORDER — ASPIRIN 81 MG PO CHEW
81.0000 mg | CHEWABLE_TABLET | Freq: Every day | ORAL | Status: DC
  Filled 2017-05-06: qty 1

## 2017-05-06 MED ORDER — ASPIRIN EC 81 MG PO TBEC
81.0000 mg | DELAYED_RELEASE_TABLET | Freq: Every day | ORAL | Status: DC
  Administered 2017-05-07: 81 mg via ORAL
  Filled 2017-05-06: qty 1

## 2017-05-06 MED ORDER — INSULIN DETEMIR 100 UNIT/ML ~~LOC~~ SOLN
30.0000 [IU] | Freq: Every day | SUBCUTANEOUS | Status: AC
  Administered 2017-05-06: 30 [IU] via SUBCUTANEOUS
  Filled 2017-05-06: qty 0.3

## 2017-05-06 MED ORDER — FUROSEMIDE 10 MG/ML IJ SOLN
40.0000 mg | Freq: Two times a day (BID) | INTRAMUSCULAR | Status: AC
  Administered 2017-05-06 (×2): 40 mg via INTRAVENOUS
  Filled 2017-05-06 (×2): qty 4

## 2017-05-06 MED FILL — Thrombin For Soln 20000 Unit: CUTANEOUS | Qty: 1 | Status: AC

## 2017-05-06 NOTE — Progress Notes (Signed)
Patient ID: John [name], male   DOB: August 18, 1940, 77 y.o.   MRN: [id number] [facility] Evening Rounds:  Hemodynamically stable.  Diuresing well  Up in chair.  BMET    Component Value Date/Time   NA 139 05/06/2017 1738   NA 144 12/07/2016 0836   NA 139 12/13/2012 1425   K 4.1 05/06/2017 1738   K 3.4 (L) 12/13/2012 1425   CL 99 (L) 05/06/2017 1738   CL 103 12/13/2012 1425   CO2 25 05/06/2017 0402   CO2 33 (H) 12/13/2012 1425   GLUCOSE 138 (H) 05/06/2017 1738   GLUCOSE 151 (H) 12/13/2012 1425   BUN 24 (H) 05/06/2017 1738   BUN 14 12/07/2016 0836   BUN 17 12/13/2012 1425   CREATININE 1.20 05/06/2017 1738   CREATININE 0.98 12/13/2012 1425   CALCIUM 7.6 (L) 05/06/2017 0402   CALCIUM 9.0 12/13/2012 1425   GFRNONAA >60 05/06/2017 0402   GFRNONAA >60 12/13/2012 1425   GFRAA >60 05/06/2017 0402   GFRAA >60 12/13/2012 1425   CBC    Component Value Date/Time   WBC 15.0 (H) 05/06/2017 0402   RBC 3.29 (L) 05/06/2017 0402   HGB 9.5 (L) 05/06/2017 1738   HGB 11.0 (L) 12/07/2016 0836   HCT 28.0 (L) 05/06/2017 1738   HCT 35.4 (L) 12/07/2016 0836   PLT 165 05/06/2017 0402   PLT 238 12/07/2016 0836   MCV 83.0 05/06/2017 0402   MCV 80 12/07/2016 0836   MCH 24.9 (L) 05/06/2017 0402   MCHC 30.0 05/06/2017 0402   RDW 15.5 05/06/2017 0402   RDW 15.1 12/07/2016 0836   LYMPHSABS 1.5 12/07/2016 0836   MONOABS 0.5 01/20/2010 1340   EOSABS 0.3 12/07/2016 0836   BASOSABS 0.0 12/07/2016 0836

## 2017-05-06 NOTE — Progress Notes (Signed)
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 - AORTIC VALVE MODEL 3300TFX, SIZE 23 MM (N/A) CORONARY ARTERY BYPASS GRAFTING (CABG) x 4 WITH ENDOSCOPIC HARVESTING OF RIGHT SAPHENOUS VEIN (N/A) TRANSESOPHAGEAL ECHOCARDIOGRAM (TEE) (N/A) Subjective: No complaints  Objective: Vital signs in last 24 hours: Temp:  [98.4 F (36.9 C)-101.1 F (38.4 C)] 98.4 F (36.9 C) (03/08 0900) Pulse Rate:  [85-98] 90 (03/08 0900) Cardiac Rhythm: Normal sinus rhythm (03/08 0800) Resp:  [12-22] 21 (03/08 0900) BP: (75-139)/(52-69) 126/65 (03/08 0900) SpO2:  [96 %-100 %] 98 % (03/08 0900) Arterial Line BP: (76-180)/(42-59) 128/49 (03/08 0900) FiO2 (%):  [40 %-50 %] 40 % (03/07 2000) Weight:  [98 kg (216 lb)] 98 kg (216 lb) (03/08 0500)  Hemodynamic parameters for last 24 hours: PAP: (21-38)/(11-22) 31/13 CO:  [3.8 L/min-6.2 L/min] 5.1 L/min CI:  [1.8 L/min/m2-2.9 L/min/m2] 2.4 L/min/m2  Intake/Output from previous day: 03/07 0701 - 03/08 0700 In: 4962.2 [I.V.:2767.2; Blood:365; NG/GT:30; IV Piggyback:1800] Out: 2905 [Urine:1410; Blood:975; Chest Tube:520] Intake/Output this shift: Total I/O In: 80 [I.V.:80] Out: 0   General appearance: alert and cooperative Neurologic: intact Heart: regular rate and rhythm, S1, S2 normal, no murmur, click, rub or gallop Lungs: clear to auscultation bilaterally Extremities: edema mild  Lab Results: Recent Labs    05/05/17 2030 05/06/17 0402  WBC 14.9* 15.0*  HGB 8.8*  8.6* 8.2*  HCT 26.0*  26.7* 27.3*  PLT 173 165   BMET:  Recent Labs    05/03/17 1634  05/05/17 2030 05/06/17 0402  NA 140   < > 142 141  K 3.1*   < > 4.0 3.9  CL 101   < > 104 106  CO2 25  --   --  25  GLUCOSE 146*   < > 127* 110*  BUN 24*   < > 18 20  CREATININE 0.92   < > 0.90  0.94 1.03  CALCIUM 9.0  --   --  7.6*   < > = values in this interval not displayed.    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

## 2017-05-06 NOTE — Anesthesia Postprocedure Evaluation (Signed)
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 - AORTIC VALVE MODEL 3300TFX, SIZE 23 MM (N/A Chest) CORONARY ARTERY BYPASS GRAFTING (CABG) x 4 WITH ENDOSCOPIC HARVESTING OF RIGHT SAPHENOUS VEIN (N/A Chest) TRANSESOPHAGEAL ECHOCARDIOGRAM (TEE) (N/A )     Patient location during evaluation: SICU Anesthesia Type: General Level of consciousness: sedated Pain management: pain level controlled Vital Signs Assessment: post-procedure vital signs reviewed and stable Respiratory status: patient remains intubated per anesthesia plan Cardiovascular status: stable Postop Assessment: no apparent nausea or vomiting Anesthetic complications: no    Last Vitals:  Vitals:   05/06/17 0800 05/06/17 0900  BP: 121/60 126/65  Pulse: 90 90  Resp: 13 (!) 21  Temp: 37 C 36.9 C  SpO2: 96% 98%    Last Pain:  Vitals:   05/06/17 0800  TempSrc: Core (Comment)  PainSc:                  Iyari Hagner

## 2017-05-07 ENCOUNTER — Encounter (HOSPITAL_COMMUNITY): Payer: Self-pay | Admitting: *Deleted

## 2017-05-07 ENCOUNTER — Other Ambulatory Visit: Payer: Self-pay

## 2017-05-07 ENCOUNTER — Inpatient Hospital Stay (HOSPITAL_COMMUNITY): Payer: PPO

## 2017-05-07 LAB — BASIC METABOLIC PANEL
ANION GAP: 9 (ref 5–15)
BUN: 26 mg/dL — ABNORMAL HIGH (ref 6–20)
CALCIUM: 7.7 mg/dL — AB (ref 8.9–10.3)
CHLORIDE: 101 mmol/L (ref 101–111)
CO2: 27 mmol/L (ref 22–32)
Creatinine, Ser: 1.07 mg/dL (ref 0.61–1.24)
GFR calc non Af Amer: >60 mL/min (ref >60)
Glucose, Bld: 118 mg/dL — ABNORMAL HIGH (ref 65–99)
Potassium: 4 mmol/L (ref 3.5–5.1)
Sodium: 137 mmol/L (ref 135–145)

## 2017-05-07 LAB — GLUCOSE, CAPILLARY
GLUCOSE-CAPILLARY: 158 mg/dL — AB (ref 65–99)
GLUCOSE-CAPILLARY: 152 mg/dL — AB (ref 65–99)
GLUCOSE-CAPILLARY: 150 mg/dL — AB (ref 65–99)
Glucose-Capillary: 131 mg/dL — ABNORMAL HIGH (ref 65–99)
Glucose-Capillary: 128 mg/dL — ABNORMAL HIGH (ref 65–99)

## 2017-05-07 LAB — CBC
HCT: 25.4 % — ABNORMAL LOW (ref 39.0–52.0)
HEMOGLOBIN: 7.9 g/dL — AB (ref 13.0–17.0)
MCH: 25.9 pg — AB (ref 26.0–34.0)
MCHC: 31.1 g/dL (ref 30.0–36.0)
MCV: 83.3 fL (ref 78.0–100.0)
Platelets: 148 10*3/uL — ABNORMAL LOW (ref 150–400)
RBC: 3.05 MIL/uL — ABNORMAL LOW (ref 4.22–5.81)
RDW: 15.6 % — ABNORMAL HIGH (ref 11.5–15.5)
WBC: 14.9 10*3/uL — ABNORMAL HIGH (ref 4.0–10.5)

## 2017-05-07 LAB — MAGNESIUM: MAGNESIUM: 2.5 mg/dL — AB (ref 1.7–2.4)

## 2017-05-07 MED ORDER — MOVING RIGHT ALONG BOOK
Freq: Once | Status: DC
  Filled 2017-05-07: qty 1

## 2017-05-07 MED ORDER — ATORVASTATIN CALCIUM 40 MG PO TABS
40.0000 mg | ORAL_TABLET | Freq: Every day | ORAL | Status: DC
  Administered 2017-05-07 – 2017-05-09 (×3): 40 mg via ORAL
  Filled 2017-05-07 (×3): qty 1

## 2017-05-07 MED ORDER — INSULIN ASPART 100 UNIT/ML ~~LOC~~ SOLN
0.0000 [IU] | Freq: Three times a day (TID) | SUBCUTANEOUS | Status: DC
Start: 2017-05-07 — End: 2017-05-10
  Administered 2017-05-07 – 2017-05-08 (×3): 2 [IU] via SUBCUTANEOUS
  Administered 2017-05-08: 4 [IU] via SUBCUTANEOUS
  Administered 2017-05-08 – 2017-05-09 (×4): 2 [IU] via SUBCUTANEOUS
  Administered 2017-05-09: 4 [IU] via SUBCUTANEOUS
  Administered 2017-05-10: 2 [IU] via SUBCUTANEOUS

## 2017-05-07 MED ORDER — TRAMADOL HCL 50 MG PO TABS
50.0000 mg | ORAL_TABLET | Freq: Four times a day (QID) | ORAL | Status: DC | PRN
  Administered 2017-05-07 – 2017-05-09 (×3): 50 mg via ORAL
  Filled 2017-05-07 (×3): qty 1

## 2017-05-07 MED ORDER — METOPROLOL TARTRATE 25 MG/10 ML ORAL SUSPENSION: 25.0000 mg | Freq: Two times a day (BID) | ORAL | Status: DC

## 2017-05-07 MED ORDER — SODIUM CHLORIDE 0.9% FLUSH
3.0000 mL | Freq: Two times a day (BID) | INTRAVENOUS | Status: DC
  Administered 2017-05-07 – 2017-05-10 (×6): 3 mL via INTRAVENOUS

## 2017-05-07 MED ORDER — BISACODYL 5 MG PO TBEC: 10.0000 mg | DELAYED_RELEASE_TABLET | Freq: Every day | ORAL | Status: DC | PRN

## 2017-05-07 MED ORDER — BISACODYL 10 MG RE SUPP: 10.0000 mg | Freq: Every day | RECTAL | Status: DC | PRN

## 2017-05-07 MED ORDER — ACETAMINOPHEN 325 MG PO TABS: 650.0000 mg | ORAL_TABLET | Freq: Four times a day (QID) | ORAL | Status: DC | PRN

## 2017-05-07 MED ORDER — OXYCODONE HCL 5 MG PO TABS
5.0000 mg | ORAL_TABLET | ORAL | Status: DC | PRN
  Administered 2017-05-09: 5 mg via ORAL
  Filled 2017-05-07: qty 1

## 2017-05-07 MED ORDER — SODIUM CHLORIDE 0.9% FLUSH: 3.0000 mL | INTRAVENOUS | Status: DC | PRN

## 2017-05-07 MED ORDER — SODIUM CHLORIDE 0.9 % IV SOLN: 250.0000 mL | INTRAVENOUS | Status: DC | PRN

## 2017-05-07 MED ORDER — ONDANSETRON HCL 4 MG PO TABS: 4.0000 mg | ORAL_TABLET | Freq: Four times a day (QID) | ORAL | Status: DC | PRN

## 2017-05-07 MED ORDER — FUROSEMIDE 40 MG PO TABS
40.0000 mg | ORAL_TABLET | Freq: Every day | ORAL | Status: DC
  Administered 2017-05-08 – 2017-05-09 (×2): 40 mg via ORAL
  Filled 2017-05-07 (×2): qty 1

## 2017-05-07 MED ORDER — DOCUSATE SODIUM 100 MG PO CAPS
200.0000 mg | ORAL_CAPSULE | Freq: Every day | ORAL | Status: DC
  Administered 2017-05-08: 200 mg via ORAL
  Filled 2017-05-07: qty 2

## 2017-05-07 MED ORDER — ONDANSETRON HCL 4 MG/2ML IJ SOLN: 4.0000 mg | Freq: Four times a day (QID) | INTRAMUSCULAR | Status: DC | PRN

## 2017-05-07 MED ORDER — FERROUS GLUCONATE 324 (38 FE) MG PO TABS
324.0000 mg | ORAL_TABLET | Freq: Two times a day (BID) | ORAL | Status: DC
  Administered 2017-05-07 – 2017-05-10 (×6): 324 mg via ORAL
  Filled 2017-05-07 (×6): qty 1

## 2017-05-07 MED ORDER — POTASSIUM CHLORIDE CRYS ER 20 MEQ PO TBCR
20.0000 meq | EXTENDED_RELEASE_TABLET | Freq: Two times a day (BID) | ORAL | Status: DC
  Administered 2017-05-07 – 2017-05-09 (×4): 20 meq via ORAL
  Filled 2017-05-07 (×4): qty 1

## 2017-05-07 MED ORDER — ASPIRIN EC 81 MG PO TBEC
81.0000 mg | DELAYED_RELEASE_TABLET | Freq: Every day | ORAL | Status: DC
  Administered 2017-05-08 – 2017-05-10 (×3): 81 mg via ORAL
  Filled 2017-05-07 (×3): qty 1

## 2017-05-07 MED ORDER — INSULIN ASPART 100 UNIT/ML ~~LOC~~ SOLN
0.0000 [IU] | Freq: Three times a day (TID) | SUBCUTANEOUS | Status: DC
  Administered 2017-05-07: 2 [IU] via SUBCUTANEOUS

## 2017-05-07 MED ORDER — FUROSEMIDE 10 MG/ML IJ SOLN
40.0000 mg | Freq: Once | INTRAMUSCULAR | Status: AC
  Administered 2017-05-07: 40 mg via INTRAVENOUS
  Filled 2017-05-07: qty 4

## 2017-05-07 MED ORDER — PANTOPRAZOLE SODIUM 40 MG PO TBEC
40.0000 mg | DELAYED_RELEASE_TABLET | Freq: Every day | ORAL | Status: DC
  Administered 2017-05-08 – 2017-05-10 (×3): 40 mg via ORAL
  Filled 2017-05-07 (×3): qty 1

## 2017-05-07 MED ORDER — METOPROLOL TARTRATE 25 MG PO TABS
25.0000 mg | ORAL_TABLET | Freq: Two times a day (BID) | ORAL | Status: DC
  Administered 2017-05-07 – 2017-05-10 (×6): 25 mg via ORAL
  Filled 2017-05-07 (×6): qty 1

## 2017-05-07 MED ORDER — METOPROLOL TARTRATE 25 MG PO TABS
25.0000 mg | ORAL_TABLET | Freq: Two times a day (BID) | ORAL | Status: DC
  Administered 2017-05-07: 25 mg via ORAL
  Filled 2017-05-07: qty 1

## 2017-05-07 NOTE — Progress Notes (Signed)
Report called to 4E RN 

## 2017-05-07 NOTE — Progress Notes (Signed)
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 Ambulated this am  Objective: Vital signs in last 24 hours: Temp:  [98 F (36.7 C)-98.4 F (36.9 C)] 98.1 F (36.7 C) (03/09 0323) Pulse Rate:  [74-92] 80 (03/09 0800) Cardiac Rhythm: Normal sinus rhythm (03/09 0800) Resp:  [10-21] 12 (03/09 0800) BP: (113-156)/(56-76) 125/59 (03/09 0800) SpO2:  [91 %-100 %] 98 % (03/09 0800) Arterial Line BP: (128-176)/(49-62) 176/62 (03/08 1300) Weight:  [98 kg (216 lb 0.8 oz)] 98 kg (216 lb 0.8 oz) (03/09 0500)  Hemodynamic parameters for last 24 hours: PAP: (31)/(13) 31/13  Intake/Output from previous day: 03/08 0701 - 03/09 0700 In: 754 [P.O.:400; I.V.:154; IV Piggyback:200] Out: 7017 [Urine:1800; Chest Tube:40] Intake/Output this shift: No intake/output data recorded.  General appearance: alert and cooperative Neurologic: intact Heart: regular rate and rhythm, S1, S2 normal, no murmur, click, rub or gallop Lungs: clear to auscultation bilaterally Extremities: edema mild Wound: dressings dry  Lab Results: Recent Labs    05/06/17 0402 05/06/17 1738 05/07/17 0347  WBC 15.0*  --  14.9*  HGB 8.2* 9.5* 7.9*  HCT 27.3* 28.0* 25.4*  PLT 165  --  148*   BMET:  Recent Labs    05/06/17 0402 05/06/17 1738 05/07/17 0347  NA 141 139 137  K 3.9 4.1 4.0  CL 106 99* 101  CO2 25  --  27  GLUCOSE 110* 138* 118*  BUN 20 24* 26*  CREATININE 1.03 1.20 1.07  CALCIUM 7.6*  --  7.7*    PT/INR:  Recent Labs    05/05/17 1504  LABPROT 17.9*  INR 1.49   ABG    Component Value Date/Time   PHART 7.324 (L) 05/05/2017 2205   HCO3 25.4 05/05/2017 2205   TCO2 27 05/06/2017 1738   ACIDBASEDEF 1.0 05/05/2017 2026   O2SAT 97.0 05/05/2017 2205   CBG (last 3)  Recent Labs    05/06/17 1945 05/06/17 2318 05/07/17 0254  GLUCAP 266* 117* 131*   CXR: ok  Assessment/Plan: S/P Procedure(s) (LRB): 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 Continue Lopressor  Volume excess: Diuresed yesterday but still puffy. Continue Lasix and KCL  DM: glucose under control on levemir and SSI. Resume oral meds when taking po well  DC foley and central line  Transfer to 4E and continue mobilization   LOS: 2 days    Gaye Pollack 05/07/2017

## 2017-05-07 NOTE — Plan of Care (Signed)
Patient progressing in goals. Up in chair, using Is, lines out. Foley d/c today. Transferring to stepdown unit.

## 2017-05-07 NOTE — Progress Notes (Signed)
Transferred to room 16 on 4 E. Via wheelchair with telmetry Updated Ronalee Belts RN on patient.

## 2017-05-08 LAB — BASIC METABOLIC PANEL
Anion gap: 9 (ref 5–15)
BUN: 35 mg/dL — ABNORMAL HIGH (ref 6–20)
CO2: 29 mmol/L (ref 22–32)
Calcium: 8 mg/dL — ABNORMAL LOW (ref 8.9–10.3)
Chloride: 98 mmol/L — ABNORMAL LOW (ref 101–111)
Creatinine, Ser: 1.06 mg/dL (ref 0.61–1.24)
Glucose, Bld: 158 mg/dL — ABNORMAL HIGH (ref 65–99)
POTASSIUM: 3.8 mmol/L (ref 3.5–5.1)
SODIUM: 136 mmol/L (ref 135–145)

## 2017-05-08 LAB — CBC
HCT: 25 % — ABNORMAL LOW (ref 39.0–52.0)
HEMOGLOBIN: 7.8 g/dL — AB (ref 13.0–17.0)
MCH: 26.1 pg (ref 26.0–34.0)
MCHC: 31.2 g/dL (ref 30.0–36.0)
MCV: 83.6 fL (ref 78.0–100.0)
PLATELETS: 169 10*3/uL (ref 150–400)
RBC: 2.99 MIL/uL — AB (ref 4.22–5.81)
RDW: 15.8 % — ABNORMAL HIGH (ref 11.5–15.5)
WBC: 12.1 10*3/uL — AB (ref 4.0–10.5)

## 2017-05-08 LAB — GLUCOSE, CAPILLARY
GLUCOSE-CAPILLARY: 158 mg/dL — AB (ref 65–99)
GLUCOSE-CAPILLARY: 144 mg/dL — AB (ref 65–99)
GLUCOSE-CAPILLARY: 146 mg/dL — AB (ref 65–99)

## 2017-05-08 MED ORDER — AMIODARONE HCL IN DEXTROSE 360-4.14 MG/200ML-% IV SOLN
60.0000 mg/h | INTRAVENOUS | Status: DC
  Administered 2017-05-08 – 2017-05-09 (×2): 60 mg/h via INTRAVENOUS
  Filled 2017-05-08: qty 200

## 2017-05-08 MED ORDER — AMIODARONE HCL IN DEXTROSE 360-4.14 MG/200ML-% IV SOLN
30.0000 mg/h | INTRAVENOUS | Status: DC
  Administered 2017-05-09 (×2): 30 mg/h via INTRAVENOUS
  Filled 2017-05-08 (×2): qty 200

## 2017-05-08 MED ORDER — AMIODARONE LOAD VIA INFUSION
150.0000 mg | Freq: Once | INTRAVENOUS | Status: AC
  Administered 2017-05-08: 150 mg via INTRAVENOUS
  Filled 2017-05-08: qty 83.34

## 2017-05-08 NOTE — Progress Notes (Signed)
Patient up in the chair for most of the day. Did ambulate a good distance in hall using tall walker. Medicated for pain x1 with positive resujlts. BLE swelling encouaged to  Elevate legs but refuse due to increase gout pain in toes when they are elevated.

## 2017-05-08 NOTE — Progress Notes (Signed)
Pt got up and walked to the bathroom then converted to Afib with RVR. HR as high as 170's. VSS. Pt states he felt fine just a little tired. EKG obtained. Dr. Cyndia Bent notified. Verbal order for IV amiodarone protocol with bolus. Will continue to monitor pt. Alysia Penna

## 2017-05-08 NOTE — Progress Notes (Addendum)
      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 Does not like the food here. Having some incisional soreness this morning.   Objective: Vital signs in last 24 hours: Temp:  [98.2 F (36.8 C)-98.8 F (37.1 C)] 98.8 F (37.1 C) (03/10 0605) Pulse Rate:  [81-95] 94 (03/10 0837) Cardiac Rhythm: Normal sinus rhythm (03/10 0700) Resp:  [12-20] 20 (03/10 0605) BP: (129-154)/(66-114) 129/68 (03/10 0837) SpO2:  [90 %-98 %] 96 % (03/10 0605) Weight:  [214 lb 11.2 oz (97.4 kg)] 214 lb 11.2 oz (97.4 kg) (03/10 0605)     Intake/Output from previous day: 03/09 0701 - 03/10 0700 In: 100 [IV Piggyback:100] Out: 1100 [Urine:1100] Intake/Output this shift: Total I/O In: 240 [P.O.:240] Out: -   General appearance: alert, cooperative and no distress Heart: regular rate and rhythm, S1, S2 normal, no murmur, click, rub or gallop Lungs: clear to auscultation bilaterally Abdomen: soft, non-tender; bowel sounds normal; no masses,  no organomegaly Extremities:+ pedal edema bilaterally  Wound: clean and dry  Lab Results: Recent Labs    05/07/17 0347 05/08/17 0558  WBC 14.9* 12.1*  HGB 7.9* 7.8*  HCT 25.4* 25.0*  PLT 148* 169   BMET:  Recent Labs    05/07/17 0347 05/08/17 0558  NA 137 136  K 4.0 3.8  CL 101 98*  CO2 27 29  GLUCOSE 118* 158*  BUN 26* 35*  CREATININE 1.07 1.06  CALCIUM 7.7* 8.0*    PT/INR:  Recent Labs    05/05/17 1504  LABPROT 17.9*  INR 1.49   ABG    Component Value Date/Time   PHART 7.324 (L) 05/05/2017 2205   HCO3 25.4 05/05/2017 2205   TCO2 27 05/06/2017 1738   ACIDBASEDEF 1.0 05/05/2017 2026   O2SAT 97.0 05/05/2017 2205   CBG (last 3)  Recent Labs    05/07/17 1627 05/07/17 2117 05/08/17 0603  GLUCAP 152* 150* 158*    Assessment/Plan: S/P Procedure(s) (LRB): 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 CV-NSR in the 80s. BP well controlled. On ASA, statin, and Lopressor.  2. Pulm-tolerating room air with good oxygen saturation. CXR yesterday showed no pneumothorax, normal post-operative changes. EPW remain in place. 3. Renal-creatinine 1.06. Electrolytes okay. Continue Lasix 40mg  daily for fluid overload. Weight continues to trend down 4. H and H 7.8/25.0 has remained stable. Will trend-expected acute blood loss anemia.  5. Thrombocytopenia- platelets 169k today 6. Endo-blood glucose has been well controlled on SSI and Levemir. On Janumet XR at home. Will hold off one more day since still not taking in much PO.  Plan: Ambulate TID. Continue to work on pain control. Continue diuretic regimen for fluid overload.    LOS: 3 days    Elgie Collard 05/08/2017   Chart reviewed, patient examined, agree with above.

## 2017-05-09 LAB — GLUCOSE, CAPILLARY
GLUCOSE-CAPILLARY: 178 mg/dL — AB (ref 65–99)
GLUCOSE-CAPILLARY: 134 mg/dL — AB (ref 65–99)
Glucose-Capillary: 124 mg/dL — ABNORMAL HIGH (ref 65–99)
Glucose-Capillary: 158 mg/dL — ABNORMAL HIGH (ref 65–99)
Glucose-Capillary: 117 mg/dL — ABNORMAL HIGH (ref 65–99)

## 2017-05-09 MED ORDER — METOPROLOL TARTRATE 5 MG/5ML IV SOLN
5.0000 mg | Freq: Once | INTRAVENOUS | Status: AC
  Administered 2017-05-09: 5 mg via INTRAVENOUS
  Filled 2017-05-09: qty 5

## 2017-05-09 MED ORDER — METFORMIN HCL 500 MG PO TABS
500.0000 mg | ORAL_TABLET | Freq: Two times a day (BID) | ORAL | Status: DC
  Administered 2017-05-09 – 2017-05-10 (×3): 500 mg via ORAL
  Filled 2017-05-09 (×3): qty 1

## 2017-05-09 MED ORDER — AMIODARONE HCL 200 MG PO TABS
400.0000 mg | ORAL_TABLET | Freq: Two times a day (BID) | ORAL | Status: DC
  Administered 2017-05-09 – 2017-05-10 (×3): 400 mg via ORAL
  Filled 2017-05-09 (×3): qty 2

## 2017-05-09 MED ORDER — LINAGLIPTIN 5 MG PO TABS
5.0000 mg | ORAL_TABLET | Freq: Every day | ORAL | Status: DC
  Administered 2017-05-09 – 2017-05-10 (×2): 5 mg via ORAL
  Filled 2017-05-09 (×2): qty 1

## 2017-05-09 NOTE — Progress Notes (Signed)
CARDIAC REHAB PHASE I   PRE:  Rate/Rhythm: 68 SR    BP: sitting 136/71    SaO2: 94 RA  MODE:  Ambulation: 470 ft   POST:  Rate/Rhythm: 84 SR    BP: sitting 137/64     SaO2: 96 RA  Pt had not been up today because of AFib. Now in NSR. Verbals cues and assist x1 to get to EOB. Stood independently after I raised bed higher. Pt needed reminders of sternal precautions. Able to walk with RW, slow steady pace. C/o SOB and needed to rest x3. SaO2 good, NSR entire walk. To recliner after walk, SOB upon sitting. Pt practiced IS, 500 mL, good mechanics. Encouraged more walking today and IS. Will f/u tomorrow. He is eager to go home tomorrow. His daughter will be with him and his wife (she is in w/c right now).  [phone]   [place], ACSM 05/09/2017 10:43 AM

## 2017-05-09 NOTE — Progress Notes (Signed)
  Amiodarone Drug - Drug Interaction Consult Note  Recommendations:  Patient is on Atorvastin, have patient report any muscle pains immediately. Patient has order for metoprolol which could increase risk of bradycardia. Be sure to monitor electrolytes closely as the patient is taking furosemide.  Amiodarone is metabolized by the cytochrome P450 system and therefore has the potential to cause many drug interactions. Amiodarone has an average plasma half-life of 50 days (range 20 to 100 days).   There is potential for drug interactions to occur several weeks or months after stopping treatment and the onset of drug interactions may be slow after initiating amiodarone.   [x]  Statins: Increased risk of myopathy. Simvastatin- restrict dose to 20mg  daily. Other statins: counsel patients to report any muscle pain or weakness immediately.  []  Anticoagulants: Amiodarone can increase anticoagulant effect. Consider warfarin dose reduction. Patients should be monitored closely and the dose of anticoagulant altered accordingly, remembering that amiodarone levels take several weeks to stabilize.  []  Antiepileptics: Amiodarone can increase plasma concentration of phenytoin, the dose should be reduced. Note that small changes in phenytoin dose can result in large changes in levels. Monitor patient and counsel on signs of toxicity.  [x]  Beta blockers: increased risk of bradycardia, AV block and myocardial depression. Sotalol - avoid concomitant use.  []   Calcium channel blockers (diltiazem and verapamil): increased risk of bradycardia, AV block and myocardial depression.  []   Cyclosporine: Amiodarone increases levels of cyclosporine. Reduced dose of cyclosporine is recommended.  []  Digoxin dose should be halved when amiodarone is started.  [x]  Diuretics: increased risk of cardiotoxicity if hypokalemia occurs.  []  Oral hypoglycemic agents (glyburide, glipizide, glimepiride): increased risk of hypoglycemia.  Patient's glucose levels should be monitored closely when initiating amiodarone therapy.   []  Drugs that prolong the QT interval:  Torsades de pointes risk may be increased with concurrent use - avoid if possible.  Monitor QTc, also keep magnesium/potassium WNL if concurrent therapy can't be avoided. Marland Kitchen Antibiotics: e.g. fluoroquinolones, erythromycin. . Antiarrhythmics: e.g. quinidine, procainamide, disopyramide, sotalol. . Antipsychotics: e.g. phenothiazines, haloperidol.  . Lithium, tricyclic antidepressants, and methadone. Thank Michelene Heady Georgios Kina  05/09/2017 11:43 AM

## 2017-05-09 NOTE — Progress Notes (Addendum)
      [address]       [place] [address]             [phone]        4 Days Post-Op Procedure(s) (LRB): AORTIC VALVE REPLACEMENT (AVR) USING MAGNA EASE PERICARDIAL BIOPROSTHESIS - AORTIC VALVE MODEL 3300TFX, SIZE 23 MM (N/A) CORONARY ARTERY BYPASS GRAFTING (CABG) x 4 WITH ENDOSCOPIC HARVESTING OF RIGHT SAPHENOUS VEIN (N/A) TRANSESOPHAGEAL ECHOCARDIOGRAM (TEE) (N/A)  Subjective: Patient states had loose stools yesterday.  Objective: Vital signs in last 24 hours: Temp:  [98.4 F (36.9 C)-99.4 F (37.4 C)] 98.7 F (37.1 C) (03/11 0346) Pulse Rate:  [37-121] 116 (03/11 0346) Cardiac Rhythm: Atrial fibrillation (03/11 0411) Resp:  [18-25] 18 (03/11 0346) BP: (95-154)/(62-98) 113/98 (03/11 0346) SpO2:  [94 %-100 %] 96 % (03/11 0346) Weight:  [213 lb 10 oz (96.9 kg)] 213 lb 10 oz (96.9 kg) (03/11 0346)  Pre op weight 98 kg Current Weight  05/09/17 213 lb 10 oz (96.9 kg)       Intake/Output from previous day: 03/10 0701 - 03/11 0700 In: 545.1 [P.O.:240; I.V.:305.1] Out: 150 [Urine:150]   Physical Exam:  Cardiovascular: IRRR IRRR Pulmonary: Slightly diminished at bases Abdomen: Soft, non tender, bowel sounds present. Extremities: ++ bilateral lower extremity edema. Wounds: Clean and dry.  No erythema or signs of infection.  Lab Results: CBC: Recent Labs    05/07/17 0347 05/08/17 0558  WBC 14.9* 12.1*  HGB 7.9* 7.8*  HCT 25.4* 25.0*  PLT 148* 169   BMET:  Recent Labs    05/07/17 0347 05/08/17 0558  NA 137 136  K 4.0 3.8  CL 101 98*  CO2 27 29  GLUCOSE 118* 158*  BUN 26* 35*  CREATININE 1.07 1.06  CALCIUM 7.7* 8.0*    PT/INR:  Lab Results  Component Value Date   INR 1.49 05/05/2017   INR 1.02 05/03/2017   INR 1.65 (H) 02/02/2010   ABG:  INR: Will add last result for INR, ABG once components are confirmed Will add last 4 CBG results once components are confirmed  Assessment/Plan:  1. CV - Went into a fib with RVR  last evening. Remains in a fib with RVR this am. On Amiodarone drip, Lopressor 25 mg bid. Will give IV Lopressor. If rate not controlled with above, may consider Digoxin. 2.  Pulmonary - On room air. Encourage incentive spirometer. 3. Volume Overload - On Lasix 40 mg daily 4.  Acute blood loss anemia - H and H yesterday stable at 7.8 and 25. Continue Fergon. 5. DM-CBGs 144/146/124. On Janumet pre op but only on Insulin now. Will stop scheduled Insulin and start reduced dose Janumet. Pre op HGA1C 6.7 6. Remove EPW in am 7. Stop stool softeners  Donielle M ZimmermanPA-C 05/09/2017,7:07 AM   Chart reviewed, patient examined, agree with above. He is back in sinus rhythm and only has a peripheral IV so will switch amiodarone to po and stop drip. Continue Lopressor. Will remove pacing wires. Weight is below preop so stop lasix and KCL.

## 2017-05-09 NOTE — Plan of Care (Signed)
  Progressing Education: Knowledge of General Education information will improve 05/09/2017 1530 - Progressing by Earney Hamburg, RN Health Behavior/Discharge Planning: Ability to manage health-related needs will improve 05/09/2017 1530 - Progressing by Earney Hamburg, RN Activity: Risk for activity intolerance will decrease 05/09/2017 1530 - Progressing by Earney Hamburg, RN Note Pt has been able to walk with PT and has sat in recliner since lunch during my shift.  Pain Managment: General experience of comfort will improve 05/09/2017 1530 - Progressing by Earney Hamburg, RN Note Pt has had no complaints of pain during my care.  Skin Integrity: Risk for impaired skin integrity will decrease 05/09/2017 1530 - Progressing by Earney Hamburg, RN Note All incision sites seem to be healing well. Abdominal sites have some drainage where pacer wires were, but vertical incision looks good.

## 2017-05-09 NOTE — Care Management Important Message (Signed)
Important Message  Patient Details  Name: John [name] MRN: [id number] Date of Birth: [birth year]   Medicare Important Message Given:  Yes    [name] P [name] 05/09/2017, 12:12 PM

## 2017-05-10 LAB — GLUCOSE, CAPILLARY: Glucose-Capillary: 140 mg/dL — ABNORMAL HIGH (ref 65–99)

## 2017-05-10 MED ORDER — METOPROLOL TARTRATE 25 MG PO TABS: 25.0000 mg | ORAL_TABLET | Freq: Two times a day (BID) | ORAL | 1 refills | Status: DC

## 2017-05-10 MED ORDER — ASPIRIN 81 MG PO TBEC: 81.0000 mg | DELAYED_RELEASE_TABLET | Freq: Every day | ORAL | 0 refills | Status: DC

## 2017-05-10 MED ORDER — AMIODARONE HCL 200 MG PO TABS: ORAL_TABLET | ORAL | 1 refills | Status: DC

## 2017-05-10 MED ORDER — ATORVASTATIN CALCIUM 40 MG PO TABS: 40.0000 mg | ORAL_TABLET | Freq: Every day | ORAL | 1 refills | Status: DC

## 2017-05-10 MED ORDER — OXYCODONE HCL 5 MG PO TABS: 5.0000 mg | ORAL_TABLET | Freq: Four times a day (QID) | ORAL | 0 refills | Status: DC | PRN

## 2017-05-10 MED FILL — Electrolyte-R (PH 7.4) Solution: INTRAVENOUS | Qty: 4000 | Status: AC

## 2017-05-10 MED FILL — Lidocaine HCl IV Inj 20 MG/ML: INTRAVENOUS | Qty: 5 | Status: AC

## 2017-05-10 MED FILL — Heparin Sodium (Porcine) Inj 1000 Unit/ML: INTRAMUSCULAR | Qty: 30 | Status: AC

## 2017-05-10 MED FILL — Heparin Sodium (Porcine) Inj 1000 Unit/ML: INTRAMUSCULAR | Qty: 20 | Status: AC

## 2017-05-10 MED FILL — Mannitol IV Soln 20%: INTRAVENOUS | Qty: 500 | Status: AC

## 2017-05-10 MED FILL — Sodium Chloride IV Soln 0.9%: INTRAVENOUS | Qty: 3000 | Status: AC

## 2017-05-10 MED FILL — Magnesium Sulfate Inj 50%: INTRAMUSCULAR | Qty: 10 | Status: AC

## 2017-05-10 MED FILL — Sodium Bicarbonate IV Soln 8.4%: INTRAVENOUS | Qty: 50 | Status: AC

## 2017-05-10 MED FILL — Potassium Chloride Inj 2 mEq/ML: INTRAVENOUS | Qty: 40 | Status: AC

## 2017-05-10 MED FILL — Dexmedetomidine HCl in NaCl 0.9% IV Soln 400 MCG/100ML: INTRAVENOUS | Qty: 100 | Status: AC

## 2017-05-10 NOTE — Discharge Summary (Signed)
[address]       [place] [address]             [phone]      Physician Discharge Summary  Patient ID: John [name] MRN: [id number] DOB/AGE: 1940/11/22 77 y.o.  Admit date: 05/05/2017 Discharge date: 05/10/2017  Admission Diagnoses: Patient Active Problem List   Diagnosis Date Noted  . Chest pain 04/06/2017  . Advance care planning 12/07/2016  . DNR (do not resuscitate) 12/07/2016  . Status post right knee replacement 02/06/2016  . Osteoarthritis of right knee 01/12/2016  . Aortic valve stenosis, moderate 05/30/2015  . Bladder cancer ([facility]) 11/15/2014  . Hyperlipidemia 11/15/2014  . ED (erectile dysfunction) 11/15/2014  . Localized osteoarthritis of right shoulder 11/15/2014  . Hypertension 11/15/2014  . Heart murmur 11/15/2014  . 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 Anemia 11/15/2014    Discharge Diagnoses:  Active Problems:   S/P CABG x 4   Discharged Condition: good  HPI:  The patient is a 77 year old active gentleman with history of diabetes, hyperlipidemia, hypertension, and aortic stenosis that has been followed by John [name]. His last echocardiogram on 12/28/2016 showed a mean aortic transvalvular gradient of 37 mmHg with a peak gradient of 63.4 mmHg. Left ventricular ejection fraction was 55%. There is mild tricuspid and mild mitral valve insufficiency. He now presents with a 92-month history of exertional chest discomfort and shortness of breath which has been occurring with activities such as walking up a hill or up the steps. He has not had these symptoms with normal daily activities around the house or walking on level ground. He has had no symptoms at rest. He has had no dizziness or syncope. He denies any orthopnea or PND. He has had no peripheral edema. He underwent cardiac catheterization on 04/21/2017 which showed severe three-vessel coronary disease with 70% proximal LAD stenosis, 80%  ostial left circumflex stenosis, 90% ostial ramus stenosis, and 75% proximal PDA stenosis. Aortic valve could not be crossed. There is mild pulmonary hypertension at [facility] Course:  On May 05, 2017 John [name], 77, underwent a coronary bypass grafting x4 and aortic valve replacement with Dr. Cyndia [name].  He tolerated the procedure well and was transferred to the surgical ICU in stable condition.  He was extubated in a timely manner.  Postop day 1 we initiated a diuretic regimen for fluid overload.  We discontinued his Deberah Pelton catheter and arterial line.  He remained hemodynamically stable.  We initiated Levemir and sliding scale insulin for blood glucose control.  We discontinued his chest tubes.  Postop day 2 he began ambulating in the hall.  We continued his diuretic regimen.  Discontinued his Foley catheter and central line.  He was stable to transfer to 4 E. for continued care.  Postop day 3 he remained in normal sinus rhythm.  He was tolerating room air at this time with good oxygen saturation.  He continued to have expected acute blood loss anemia which we trended.  His blood glucose level remained controlled.  Postop day 4 we restarted his Janumet discontinued his insulin.  We removed his epicardial pacing wires.  He did go into atrial fibrillation with RVR the night before and was placed on amiodarone drip.  We transitioned to oral amiodarone and continued on his oral Lopressor.  His rhythm remained stable.  His weight return to below his preop weight therefore Lasix and potassium supplementation  was stopped.  Postop day 5 he continued to progress.  He was tolerating room air with excellent oxygen saturation.  He was ambulating the hallways with limited assistance.  He remained in normal sinus rhythm in the 90s.  At this time he was deemed appropriate for discharge.  Consults: None  Significant Diagnostic Studies:  CLINICAL DATA:  CABG  EXAM: PORTABLE CHEST 1 VIEW  COMPARISON:   05/06/2017  FINDINGS: Prior CABG. Mild cardiomegaly with vascular congestion. Bibasilar atelectasis. Interval removal of Swan-Ganz catheter and left chest tube. No pneumothorax.  IMPRESSION: Interval removal of left chest tube.  No pneumothorax.  Cardiomegaly, vascular congestion and bibasilar atelectasis.   Electronically Signed   By: John [name] M.D.   On: 05/07/2017 07:23  Treatments:   CARDIOVASCULAR SURGERY OPERATIVE NOTE  05/05/2017  Surgeon:  John Pollack, MD  First Assistant: John Rough,  PA-C   Preoperative Diagnosis:  Severe multi-vessel coronary artery disease and severe aortic stenosis.   Postoperative Diagnosis:  Same   Procedure:  1. Median Sternotomy 2. Extracorporeal circulation 3.   Coronary artery bypass grafting x 4   Left internal mammary graft to the LAD  SVG to Ramus  SVG to OM  SVG to PDA 4.   Endoscopic vein harvest from the right leg 5.   Aortic valve replacement using a 23 mm Edwards Magna-Ease pericardial valve   Anesthesia:  General Endotracheal    Discharge Exam: Blood pressure 139/68, pulse 83, temperature 98.3 F (36.8 C), temperature source Oral, resp. rate (!) 25, height 5\' 11"  (1.803 m), weight 210 lb 9.6 oz (95.5 kg), SpO2 97 %.   General appearance: alert, cooperative and no distress Heart: regular rate and rhythm, S1, S2 normal, no murmur, click, rub or gallop Lungs: clear to auscultation bilaterally Abdomen: soft, non-tender; bowel sounds normal; no masses,  no organomegaly Extremities: 2+ pitting pedal edema Wound: clean and dry   Disposition: 01-Home or Self Care   Allergies as of 05/10/2017      Reactions   Aspirin Other (See Comments)   bruising      Medication List    STOP taking these medications   amLODipine 5 MG tablet Commonly known as:  NORVASC   losartan 50 MG tablet Commonly known as:  COZAAR   lovastatin 40 MG tablet Commonly known as:  MEVACOR   naproxen  sodium 220 MG tablet Commonly known as:  ALEVE     TAKE these medications   amiodarone 200 MG tablet Commonly known as:  PACERONE Take 400mg  (2 tabs) twice a day for 4 days then 200mg  (1 tab) twice a day until we see you in follow-up.   aspirin 81 MG EC tablet Take 1 tablet (81 mg total) by mouth daily. Start taking on:  05/11/2017   atorvastatin 40 MG tablet Commonly known as:  LIPITOR Take 1 tablet (40 mg total) by mouth daily at 6 PM.   cholecalciferol 1000 units tablet Commonly known as:  VITAMIN D Take 2,000 Units by mouth daily.   hydrochlorothiazide 12.5 MG capsule Commonly known as:  MICROZIDE Take 1 capsule (12.5 mg total) by mouth daily.   metoprolol tartrate 25 MG tablet Commonly known as:  LOPRESSOR Take 1 tablet (25 mg total) by mouth 2 (two) times daily.   oxyCODONE 5 MG immediate release tablet Commonly known as:  Oxy IR/ROXICODONE Take 1 tablet (5 mg total) by mouth every 6 (six) hours as needed for severe pain.   RA VITAMIN B-12 TR 1000  MCG Tbcr Generic drug:  Cyanocobalamin Take 1,000 mcg by mouth daily.   SitaGLIPtin-MetFORMIN HCl 50-1000 MG Tb24 Commonly known as:  JANUMET XR Take 1 tablet by mouth 2 (two) times daily. What changed:  when to take this   vitamin C 1000 MG tablet Take 1,000 mg by mouth daily.      Follow-up Information    Kathrine Haddock, NP. Call in 1 day(s).   Specialties:  Nurse Practitioner, Family Medicine Contact information: [address] [address] [phone]        John Pollack, MD Follow up.   Specialty:  Cardiothoracic Surgery Why:  Your appointment is on June 08, 2017 at 1:00 PM.  Please arrive at 12:30 PM for a chest x-ray located at [facility] imaging which is on the first floor of our building. Contact information: [address] [place] [address] [phone]        Nursing suture removal appointment Follow up.   Why:  Your suture removal appointment is on May 17, 2017 at  10:30 AM. Contact information: Dr. Vivi Martens office       Saralyn Pilar, Sheppard Coil, MD Follow up.   Specialty:  Cardiology Why:  Please call for an appointment for 2 weeks after discharge date.  Contact information: [facility] [place] [address] [phone]         The patient has been discharged on:   1.Beta Blocker:  Yes [ x  ]                              No   [   ]                              If No, reason:  2.Ace Inhibitor/ARB: Yes [   ]                                     No  [  x  ]                                     If No, reason: titration of BB  3.Statin:   Yes [ x  ]                  No  [   ]                  If No, reason:  4.Ecasa:  Yes  [ x  ]                  No   [   ]                  If No, reason:    Signed: Elgie Collard 05/10/2017, 9:31 AM

## 2017-05-10 NOTE — Discharge Instructions (Signed)
Aortic Valve Replacement, Care After °Refer to this sheet in the next few weeks. These instructions provide you with information about caring for yourself after your procedure. Your health care provider may also give you more specific instructions. Your treatment has been planned according to current medical practices, but problems sometimes occur. Call your health care provider if you have any problems or questions after your procedure. °What can I expect after the procedure? °After the procedure, it is common to have: °· Pain around your incision area. °· A small amount of blood or clear fluid coming from your incision. ° °Follow these instructions at home: °Eating and drinking ° °· Follow instructions from your health care provider about eating or drinking restrictions. °? Limit alcohol intake to no more than 1 drink per day for nonpregnant women and 2 drinks per day for men. One drink equals 12 oz of beer, 5 oz of wine, or 1½ oz of hard liquor. °? Limit how much caffeine you drink. Caffeine can affect your heart's rate and rhythm. °· Drink enough fluid to keep your urine clear or pale yellow. °· Eat a heart-healthy diet. This should include plenty of fresh fruits and vegetables. If you eat meat, it should be lean cuts. Avoid foods that are: °? High in salt, saturated fat, or sugar. °? Canned or highly processed. °? Fried. °Activity °· Return to your normal activities as told by your health care provider. Ask your health care provider what activities are safe for you. °· Exercise regularly once you have recovered, as told by your health care provider. °· Avoid sitting for more than 2 hours at a time without moving. Get up and move around at least once every 1-2 hours. This helps to prevent blood clots in the legs. °· Do not lift anything that is heavier than 10 lb (4.5 kg) until your health care provider approves. °· Avoid pushing or pulling things with your arms until your health care provider approves. This  includes pulling on handrails to help you climb stairs. °Incision care ° °· Follow instructions from your health care provider about how to take care of your incision. Make sure you: °? Wash your hands with soap and water before you change your bandage (dressing). If soap and water are not available, use hand sanitizer. °? Change your dressing as told by your health care provider. °? Leave stitches (sutures), skin glue, or adhesive strips in place. These skin closures may need to stay in place for 2 weeks or longer. If adhesive strip edges start to loosen and curl up, you may trim the loose edges. Do not remove adhesive strips completely unless your health care provider tells you to do that. °· Check your incision area every day for signs of infection. Check for: °? More redness, swelling, or pain. °? More fluid or blood. °? Warmth. °? Pus or a bad smell. °Medicines °· Take over-the-counter and prescription medicines only as told by your health care provider. °· If you were prescribed an antibiotic medicine, take it as told by your health care provider. Do not stop taking the antibiotic even if you start to feel better. °Travel °· Avoid airplane travel for as long as told by your health care provider. °· When you travel, bring a list of your medicines and a record of your medical history with you. Carry your medicines with you. °Driving °· Ask your health care provider when it is safe for you to drive. Do not drive until your health   care provider approves. °· Do not drive or operate heavy machinery while taking prescription pain medicine. °Lifestyle ° °· Do not use any tobacco products, such as cigarettes, chewing tobacco, or e-cigarettes. If you need help quitting, ask your health care provider. °· Resume sexual activity as told by your health care provider. Do not use medicines for erectile dysfunction unless your health care provider approves, if this applies. °· Work with your health care provider to keep your  blood pressure and cholesterol under control, and to manage any other heart conditions that you have. °· Maintain a healthy weight. °General instructions °· Do not take baths, swim, or use a hot tub until your health care provider approves. °· Do not strain to have a bowel movement. °· Avoid crossing your legs while sitting down. °· Check your temperature every day for a fever. A fever may be a sign of infection. °· If you are a woman and you plan to become pregnant, talk with your health care provider before you become pregnant. °· Wear compression stockings if your health care provider instructs you to do this. These stockings help to prevent blood clots and reduce swelling in your legs. °· Tell all health care providers who care for you that you have an artificial (prosthetic) aortic valve. If you have or have had heart disease or endocarditis, tell all health care providers about these conditions as well. °· Keep all follow-up visits as told by your health care provider. This is important. °Contact a health care provider if: °· You develop a skin rash. °· You experience sudden, unexplained changes in your weight. °· You have more redness, swelling, or pain around your incision. °· You have more fluid or blood coming from your incision. °· Your incision feels warm to the touch. °· You have pus or a bad smell coming from your incision. °· You have a fever. °Get help right away if: °· You develop chest pain that is different from the pain coming from your incision. °· You develop shortness of breath or difficulty breathing. °· You start to feel light-headed. °These symptoms may represent a serious problem that is an emergency. Do not wait to see if the symptoms will go away. Get medical help right away. Call your local emergency services (911 in the [place] Do not drive yourself to the hospital. °This information is not intended to replace advice given to you by your health care provider. Make sure you discuss any  questions you have with your health care provider. °Document Released: 09/03/2004 Document Revised: 07/24/2015 Document Reviewed: 01/19/2015 °Elsevier Interactive Patient Education © 2017 [employer] ° °

## 2017-05-10 NOTE — Progress Notes (Addendum)
      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 Excited to go home today.   Objective: Vital signs in last 24 hours: Temp:  [98.3 F (36.8 C)-98.8 F (37.1 C)] 98.3 F (36.8 C) (03/12 0414) Pulse Rate:  [76-108] 83 (03/12 0416) Cardiac Rhythm: Normal sinus rhythm;Bundle branch block (03/12 0545) Resp:  [18-25] 25 (03/12 0416) BP: (113-169)/(54-72) 139/68 (03/12 0416) SpO2:  [93 %-97 %] 97 % (03/12 0416) Weight:  [210 lb 9.6 oz (95.5 kg)] 210 lb 9.6 oz (95.5 kg) (03/12 0416)     Intake/Output from previous day: 03/11 0701 - 03/12 0700 In: 454.8 [I.V.:454.8] Out: 675 [Urine:675] Intake/Output this shift: No intake/output data recorded.  General appearance: alert, cooperative and no distress Heart: regular rate and rhythm, S1, S2 normal, no murmur, click, rub or gallop Lungs: clear to auscultation bilaterally Abdomen: soft, non-tender; bowel sounds normal; no masses,  no organomegaly Extremities: 2+ pitting pedal edema Wound: clean and dry  Lab Results: Recent Labs    05/08/17 0558  WBC 12.1*  HGB 7.8*  HCT 25.0*  PLT 169   BMET:  Recent Labs    05/08/17 0558  NA 136  K 3.8  CL 98*  CO2 29  GLUCOSE 158*  BUN 35*  CREATININE 1.06  CALCIUM 8.0*    PT/INR: No results for input(s): LABPROT, INR in the last 72 hours. 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 CV- hx of afib with RVR. On Amio PO 400mg  BID, Lopressor 25mg  BID. He is currently in NSR in the 90s. BP well controlled. EPW have been removed.  2. Pulm-On room air. Encourage incentive spirometer.  3. Renal-Lasix was stopped yesterday however per the patient he is still about 3lbs fluid overloaded. He still has pitting pedal edema.  4. Last H and H 7.8/25.0, platelets trending up 5. Endo-blood glucose level well controlled. Back on oral home medication. 6. Diarrhea-resolved.   Plan: home today. Still remains about 3lbs fluid overloaded. I will place the patient back on home dose of HCTZ. Otherwise he is doing very well.    LOS: 5 days    Elgie Collard 05/10/2017

## 2017-05-10 NOTE — Progress Notes (Signed)
Ed completed with pt and family. Good reception. Will refer to [place]. Set up d/c video. [place] 11:58 AM 05/10/2017

## 2017-05-10 NOTE — Progress Notes (Signed)
Pt d/c'd to home w/ family. Instructions and prescriptions provided to patient and family at bedside. Verbalized understanding. Escorted w/ family by volunteer services

## 2017-05-10 NOTE — Care Management Note (Signed)
Case Management Note Marvetta Gibbons RN, BSN Unit 4E-Case Manager [phone]  Patient Details  Name: John [name] MRN: [id number] Date of Birth: 09/25/1940  Subjective/Objective:  Pt admitted s/p CABG x4                  Action/Plan: PTA pt lived at home with spouse - pt to return home- no CM needs noted for transition home  Expected Discharge Date:  05/10/17               Expected Discharge Plan:  Home/Self Care  In-House Referral:  NA  Discharge planning Services  CM Consult  Post Acute Care Choice:  NA Choice offered to:  NA  DME Arranged:    DME Agency:     HH Arranged:    [place] Agency:     Status of Service:  Completed, signed off  If discussed at [place] of Stay Meetings, dates discussed:    Discharge Disposition: home/self care   Additional Comments:  Dawayne Patricia, RN 05/10/2017, 10:41 AM

## 2017-05-11 LAB — TYPE AND SCREEN
ABO/RH(D): A NEG
ANTIBODY SCREEN: NEGATIVE
UNIT DIVISION: 0
Unit division: 0
Unit division: 0
Unit division: 0

## 2017-05-11 LAB — BPAM RBC
BLOOD PRODUCT EXPIRATION DATE: 201903292359
Blood Product Expiration Date: 201903282359
Blood Product Expiration Date: 201903302359
Blood Product Expiration Date: 201904012359
ISSUE DATE / TIME: 201903070855
ISSUE DATE / TIME: 201903070855
UNIT TYPE AND RH: 600
UNIT TYPE AND RH: 600
Unit Type and Rh: 600
Unit Type and Rh: 600

## 2017-05-13 ENCOUNTER — Telehealth: Payer: Self-pay

## 2017-05-13 NOTE — Telephone Encounter (Signed)
John [name] called to say John [name] blood pressure has been running a bit high.  He is s/p AVR/ CABG 05/05/2017 and was discharged from the hospital 05/10/2017/ She stated that he has been in some pain and taking his pain medication as he should.  She also stated that the blood pressure did decrease after BP medications given.  She stated that his BP has been ranging from 329'V systolic to BTY-606'Y.  Diastolic high 04'H- mid 99'H.  I advised her to make sure he is taking his blood pressure medications.  And to make sure he makes an appointment with his Cardiologist after discharge, which she was not aware of.  I also advised her to make sure the Cardiologist was aware of his current blood pressure's as well.  She acknowledged receipt.

## 2017-05-13 NOTE — Consult Note (Signed)
            [facility] CM Primary Care Navigator  05/13/2017  John [name] 1940-12-13 [id number]  Attempt to see patient at the bedside to identify possible discharge needs but he was alreadydischarged home.  Patient was seen and treated for severe multi-vessel coronary artery disease and severe aortic stenosis, status post coronary artery bypass grafting x 4.  Patient has discharge instruction to follow-up with primary care provider Kathrine Haddock, NP) on 3/13 and cardiology follow-up in 2 weeks.   For additional questions please contact:  Edwena Felty A. Bobbye Petti, BSN, RN-BC [facility] PRIMARY CARE Navigator Cell: [phone]

## 2017-05-16 ENCOUNTER — Ambulatory Visit (INDEPENDENT_AMBULATORY_CARE_PROVIDER_SITE_OTHER): Payer: Self-pay

## 2017-05-16 VITALS — BP 130/59 | HR 66 | Resp 20

## 2017-05-16 DIAGNOSIS — Z4802 Encounter for removal of sutures: Secondary | ICD-10-CM

## 2017-05-16 DIAGNOSIS — R600 Localized edema: Secondary | ICD-10-CM

## 2017-05-16 MED ORDER — POTASSIUM CHLORIDE ER 10 MEQ PO TBCR: 10.0000 meq | EXTENDED_RELEASE_TABLET | Freq: Once | ORAL | 0 refills | Status: DC

## 2017-05-16 MED ORDER — FUROSEMIDE 40 MG PO TABS: 40.0000 mg | ORAL_TABLET | Freq: Every day | ORAL | 0 refills | Status: DC

## 2017-05-16 NOTE — Progress Notes (Signed)
Removed 3 sutures from chest tube sites, with no signs of infection and patient tolerated well. He is C/O bi-lat edema in feet and ankles x 1 week with some shortness of breath with exertion.  He said that he has not been elevating his feet. But will start now.  Dr Cyndia Bent ok'ed Lasix 40 mg po daily with Potassium 10 meq daily. RX's were sent to pharm.

## 2017-05-24 ENCOUNTER — Encounter (HOSPITAL_COMMUNITY): Payer: Self-pay | Admitting: Surgery

## 2017-05-24 DIAGNOSIS — R079 Chest pain, unspecified: Secondary | ICD-10-CM | POA: Diagnosis not present

## 2017-05-24 DIAGNOSIS — I350 Nonrheumatic aortic (valve) stenosis: Secondary | ICD-10-CM | POA: Diagnosis not present

## 2017-05-24 DIAGNOSIS — I10 Essential (primary) hypertension: Secondary | ICD-10-CM | POA: Diagnosis not present

## 2017-05-24 DIAGNOSIS — E119 Type 2 diabetes mellitus without complications: Secondary | ICD-10-CM | POA: Diagnosis not present

## 2017-05-24 DIAGNOSIS — E785 Hyperlipidemia, unspecified: Secondary | ICD-10-CM | POA: Diagnosis not present

## 2017-06-07 ENCOUNTER — Other Ambulatory Visit: Payer: Self-pay

## 2017-06-07 ENCOUNTER — Other Ambulatory Visit: Payer: Self-pay | Admitting: Surgery

## 2017-06-07 ENCOUNTER — Encounter: Payer: Self-pay | Admitting: Unknown Physician Specialty

## 2017-06-07 ENCOUNTER — Ambulatory Visit (INDEPENDENT_AMBULATORY_CARE_PROVIDER_SITE_OTHER): Payer: PPO | Admitting: Unknown Physician Specialty

## 2017-06-07 VITALS — BP 137/73 | HR 76 | Temp 97.4°F | Ht 71.0 in | Wt 199.9 lb

## 2017-06-07 DIAGNOSIS — E785 Hyperlipidemia, unspecified: Secondary | ICD-10-CM | POA: Diagnosis not present

## 2017-06-07 DIAGNOSIS — I10 Essential (primary) hypertension: Secondary | ICD-10-CM

## 2017-06-07 DIAGNOSIS — I499 Cardiac arrhythmia, unspecified: Secondary | ICD-10-CM | POA: Diagnosis not present

## 2017-06-07 DIAGNOSIS — E1122 Type 2 diabetes mellitus with diabetic chronic kidney disease: Secondary | ICD-10-CM | POA: Diagnosis not present

## 2017-06-07 DIAGNOSIS — Z952 Presence of prosthetic heart valve: Secondary | ICD-10-CM | POA: Diagnosis not present

## 2017-06-07 DIAGNOSIS — D649 Anemia, unspecified: Secondary | ICD-10-CM

## 2017-06-07 DIAGNOSIS — N181 Chronic kidney disease, stage 1: Secondary | ICD-10-CM

## 2017-06-07 DIAGNOSIS — I4891 Unspecified atrial fibrillation: Secondary | ICD-10-CM | POA: Diagnosis not present

## 2017-06-07 DIAGNOSIS — I350 Nonrheumatic aortic (valve) stenosis: Secondary | ICD-10-CM | POA: Diagnosis not present

## 2017-06-07 DIAGNOSIS — Z951 Presence of aortocoronary bypass graft: Secondary | ICD-10-CM

## 2017-06-07 DIAGNOSIS — R011 Cardiac murmur, unspecified: Secondary | ICD-10-CM | POA: Diagnosis not present

## 2017-06-07 DIAGNOSIS — R079 Chest pain, unspecified: Secondary | ICD-10-CM | POA: Diagnosis not present

## 2017-06-07 LAB — CBC WITH DIFFERENTIAL/PLATELET
Hematocrit: 31 % — ABNORMAL LOW (ref 37.5–51.0)
Hemoglobin: 9.3 g/dL — ABNORMAL LOW (ref 13.0–17.7)
LYMPHS: 17 %
Lymphocytes Absolute: 1.3 10*3/uL (ref 0.7–3.1)
MCH: 24.3 pg — AB (ref 26.6–33.0)
MCHC: 30 g/dL — ABNORMAL LOW (ref 31.5–35.7)
MCV: 81 fL (ref 79–97)
MID (Absolute): 1.2 10*3/uL (ref 0.1–1.6)
MID: 15 %
Neutrophils Absolute: 5.2 10*3/uL (ref 1.4–7.0)
Neutrophils: 68 %
PLATELETS: 227 10*3/uL (ref 150–379)
RBC: 3.82 x10E6/uL — AB (ref 4.14–5.80)
RDW: 14.8 % (ref 12.3–15.4)
WBC: 7.7 10*3/uL (ref 3.4–10.8)

## 2017-06-07 LAB — BAYER DCA HB A1C WAIVED: HB A1C: 6.4 % (ref <7.0)

## 2017-06-07 NOTE — Assessment & Plan Note (Addendum)
In the hospital following CABG.  Had a transfusion for Hgb of 7.3%.  H/H 9.3/33 now. Recheck in 1 months

## 2017-06-07 NOTE — Assessment & Plan Note (Signed)
Hgb A1C is 6.4%  This is stable.  Continue present medications.  Takes Januvia just once a day

## 2017-06-07 NOTE — Assessment & Plan Note (Signed)
Stable.  On post CABG meds.  Per heart surgeon and cardiology

## 2017-06-07 NOTE — Assessment & Plan Note (Signed)
On Atorvastatin 40 mg.  Continue present

## 2017-06-07 NOTE — Progress Notes (Signed)
/  BP 137/73   Pulse 76   Temp (!) 97.4 F (36.3 C) (Oral)   Ht 5\' 11"  (1.803 m)   Wt 199 lb 14.4 oz (90.7 kg)   SpO2 95%   BMI 27.88 kg/m    Subjective:    Patient [name]: John [name], male    DOB: Jul 23, 1940, 77 y.o.   MRN: [id number]  HPI: John [name] is a 77 y.o. male  Chief Complaint  Patient presents with  . Diabetes  . Hyperlipidemia  . Hypertension   Diabetes: Using medications without difficulties No hypoglycemic episodes No hyperglycemic episodes Feet problems: Blood Sugars averaging: eye exam within last year Last Hgb A1C: 6.4  Hypertension  Using medications without difficulty Average home BPs   Using medication without problems or lightheadedness No chest pain with exertion or shortness of breath No Edema  Elevated Cholesterol Using medications without problems No Muscle aches  Diet:  Exercise:  CAD Heart bypass x 4 3/12.  Follows with Dr. Josefa Half  Anemia  Following surgery.  Had a transfusion.       Relevant past medical, surgical, family and social history reviewed and updated as indicated. Interim medical history since our last visit reviewed. Allergies and medications reviewed and updated.  Review of Systems  Constitutional: Negative.   HENT: Negative.   Eyes: Negative.   Respiratory: Negative.   Cardiovascular: Negative.   Gastrointestinal: Negative.   Endocrine: Negative.   Genitourinary: Negative.   Musculoskeletal: Negative.   Skin: Negative.   Allergic/Immunologic: Negative.   Neurological: Negative.   Hematological: Negative.   Psychiatric/Behavioral: The patient is nervous/anxious.     Per HPI unless specifically indicated above     Objective:    BP 137/73   Pulse 76   Temp (!) 97.4 F (36.3 C) (Oral)   Ht 5\' 11"  (1.803 m)   Wt 199 lb 14.4 oz (90.7 kg)   SpO2 95%   BMI 27.88 kg/m   Wt Readings from Last 3 Encounters:  06/07/17 199 lb 14.4 oz (90.7 kg)  05/10/17 210 lb 9.6 oz (95.5 kg)  05/03/17 207  lb (93.9 kg)    Physical Exam  Constitutional: He is oriented to person, place, and time. He appears well-developed and well-nourished. No distress.  HENT:  Head: Normocephalic and atraumatic.  Eyes: Conjunctivae and lids are normal. Right eye exhibits no discharge. Left eye exhibits no discharge. No scleral icterus.  Neck: Normal range of motion. Neck supple. No JVD present. Carotid bruit is John present.  Cardiovascular: An irregular rhythm present.  Pulmonary/Chest: Effort normal and breath sounds normal. No respiratory distress.  Abdominal: Normal appearance. There is no splenomegaly or hepatomegaly.  Musculoskeletal: Normal range of motion.  Neurological: He is alert and oriented to person, place, and time.  Skin: Skin is warm, dry and intact. No rash noted. No pallor.  Psychiatric: He has a normal mood and affect. His behavior is normal. Judgment and thought content normal.   EKG: Variable heart rate.  A-Fib.      Assessment & Plan:   Problem List Items Addressed This Visit      Unprioritized   Anemia    In the hospital following CABG.  Had a transfusion for Hgb of 7.3%.  H/H 9.3/33 now. Recheck in 1 months      Relevant Orders   CBC With Differential/Platelet   Hyperlipidemia    On Atorvastatin 40 mg.  Continue present      Hypertension    Stable.  On post CABG meds.  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  Continue present medications.  Takes Januvia just once a day      Relevant Orders   Lipid Panel w/o Chol/HDL Ratio   Bayer DCA Hb A1c Waived    Other Visit Diagnoses    Irregular heart rate       New onset a-fib by EKG.  S/P bypass last month.  Cardiology will see him at 2p for assessment   Relevant Orders   EKG 12-Lead (Completed)       Follow up plan: Return in about 1 month (around  07/05/2017).

## 2017-06-08 ENCOUNTER — Encounter: Payer: Self-pay | Admitting: Surgery

## 2017-06-08 ENCOUNTER — Ambulatory Visit
Admission: RE | Admit: 2017-06-08 | Discharge: 2017-06-08 | Disposition: A | Discharge status: Home / Self Care | Payer: PPO | Source: Ambulatory Visit | Attending: Surgery | Admitting: Surgery

## 2017-06-08 ENCOUNTER — Ambulatory Visit (INDEPENDENT_AMBULATORY_CARE_PROVIDER_SITE_OTHER): Payer: Self-pay | Admitting: Surgery

## 2017-06-08 VITALS — BP 135/70 | HR 75 | Resp 20 | Ht 71.0 in | Wt 199.0 lb

## 2017-06-08 DIAGNOSIS — Z952 Presence of prosthetic heart valve: Secondary | ICD-10-CM

## 2017-06-08 DIAGNOSIS — Z951 Presence of aortocoronary bypass graft: Secondary | ICD-10-CM

## 2017-06-08 DIAGNOSIS — I350 Nonrheumatic aortic (valve) stenosis: Secondary | ICD-10-CM

## 2017-06-08 DIAGNOSIS — R0602 Shortness of breath: Secondary | ICD-10-CM | POA: Diagnosis not present

## 2017-06-08 DIAGNOSIS — I2510 Atherosclerotic heart disease of native coronary artery without angina pectoris: Secondary | ICD-10-CM

## 2017-06-08 NOTE — Progress Notes (Signed)
HPI: Patient returns for routine postoperative follow-up having undergone coronary artery bypass graft surgery x4 and aortic valve replacement using a 23 mm Edwards pericardial valve on 05/05/2017. The patient's early postoperative recovery while in the hospital was notable for development of postoperative atrial fibrillation.  He was started on amiodarone with conversion to sinus rhythm.. Since hospital discharge the patient reports that he developed lower extremity edema after going home.  He called our office and was placed on Lasix and potassium which has improved that.  He has been outside walking short distances but not very fast according to his wife.  He still reports some exertional shortness of breath.  He denies any chest discomfort.  He also notes development of bilateral upper extremity tremor particularly in his hands but he has had no pain or numbness.  He is anxious to return to riding his lawnmower.  He was seen by his primary physician yesterday and felt to be in atrial fibrillation.  He was referred back to Dr. Saralyn Pilar yesterday afternoon and a 24-hour Holter monitor was placed.  The patient said that Dr. Saralyn Pilar did not feel that he was in atrial fibrillation when he saw him in the office.   Current Outpatient Medications  Medication Sig Dispense Refill  . amiodarone (PACERONE) 200 MG tablet Take 400mg  (2 tabs) twice a day for 4 days then 200mg  (1 tab) twice a day until we see you in follow-up. 80 tablet 1  . atorvastatin (LIPITOR) 40 MG tablet Take 1 tablet (40 mg total) by mouth daily at 6 PM. 30 tablet 1  . furosemide (LASIX) 40 MG tablet Take 1 tablet (40 mg total) by mouth daily. 30 tablet 0  . KLOR-CON M10 10 MEQ tablet Take 1 tablet by mouth daily.    . metoprolol tartrate (LOPRESSOR) 25 MG tablet Take 1 tablet (25 mg total) by mouth 2 (two) times daily. 60 tablet 1  . oxyCODONE (OXY IR/ROXICODONE) 5 MG immediate release tablet Take 1 tablet (5 mg total) by mouth  every 6 (six) hours as needed for severe pain. 30 tablet 0  . SitaGLIPtin-MetFORMIN HCl (JANUMET XR) 50-1000 MG TB24 Take 1 tablet by mouth 2 (two) times daily. (Patient taking differently: Take 1 tablet by mouth daily. ) 180 tablet 3  . Ascorbic Acid (VITAMIN C) 1000 MG tablet Take 1,000 mg by mouth daily.    Marland Kitchen aspirin 81 MG EC tablet Take 1 tablet (81 mg total) by mouth daily. (Patient not taking: Reported on 06/08/2017) 30 tablet 0  . cholecalciferol (VITAMIN D) 1000 units tablet Take 2,000 Units by mouth daily.    . Cyanocobalamin (RA VITAMIN B-12 TR) 1000 MCG TBCR Take 1,000 mcg by mouth daily.     . hydrochlorothiazide (MICROZIDE) 12.5 MG capsule Take 1 capsule (12.5 mg total) by mouth daily. (Patient not taking: Reported on 06/08/2017) 90 capsule 1   No current facility-administered medications for this visit.     Physical Exam: BP 135/70   Pulse 75   Resp 20   Ht 5\' 11"  (1.803 m)   Wt 199 lb (90.3 kg)   SpO2 96% Comment: RA  BMI 27.75 kg/m  He looks well Lungs are clear Cardiac exam shows a regular rate and rhythm with normal heart sounds. The chest incision is healing well and the sternum is stable. The leg incisions are healing well and there is mild ankle edema bilaterally.   Diagnostic Tests:  CLINICAL DATA:  Shortness of Breath  EXAM: CHEST - 2 VIEW  COMPARISON:  May 07, 2017  FINDINGS: There is no edema or consolidation. Heart size and pulmonary vascularity are normal. Patient is status post coronary artery bypass grafting. Patient is status post aortic valve replacement. No adenopathy. No bone lesions.  IMPRESSION: Postoperative changes.  No edema or consolidation.   Electronically Signed   By: Lowella Grip III M.D.   On: 06/08/2017 13:22   Impression:  Overall I think Mr. Berthelot is recovering well following his surgery.  I encouraged him to get out walking more and he is planning on participating in cardiac rehab in the near future.  I  think this will help him to build up some stamina and should help his exertional shortness of breath.  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  This combined with postoperative deconditioning and some diastolic dysfunction are likely contributing to his exertional dyspnea.  He has normal left ventricular systolic function.  His rhythm today appears to be sinus with frequent extra beats.  If his Holter monitor shows no atrial fibrillation that I think he could be taken off of amiodarone since it was only started for postoperative atrial fibrillation.  His bilateral upper extremity tremor could be related to the amiodarone so it is probably best to try to get him off of that.  He asked if he should remain on the diuretic and I think for now he should remain on Lasix and potassium since he still has some mild lower extremity edema and his preoperative echo did show some diastolic dysfunction.  Plan:  The patient has a follow-up appointment with Dr. Saralyn Pilar and will return to see me if he has any problems with his incisions.   Gaye Pollack, MD [facility] [phone]

## 2017-06-10 ENCOUNTER — Telehealth: Payer: Self-pay

## 2017-06-10 LAB — CBC WITH DIFFERENTIAL/PLATELET

## 2017-06-10 LAB — COMPREHENSIVE METABOLIC PANEL
A/G RATIO: 1.4 (ref 1.2–2.2)
ALK PHOS: 92 IU/L (ref 39–117)
ALT: 13 IU/L (ref 0–44)
AST: 21 IU/L (ref 0–40)
Albumin: 4.2 g/dL (ref 3.5–4.8)
BILIRUBIN TOTAL: 0.5 mg/dL (ref 0.0–1.2)
BUN / CREAT RATIO: 14 (ref 10–24)
BUN: 18 mg/dL (ref 8–27)
CHLORIDE: 101 mmol/L (ref 96–106)
CO2: 29 mmol/L (ref 20–29)
Calcium: 8.9 mg/dL (ref 8.6–10.2)
Creatinine, Ser: 1.27 mg/dL (ref 0.76–1.27)
GFR calc non Af Amer: 53 mL/min/{1.73_m2} — ABNORMAL LOW (ref >59)
GFR, EST AFRICAN AMERICAN: 62 mL/min/{1.73_m2} (ref >59)
GLUCOSE: 124 mg/dL — AB (ref 65–99)
Globulin, Total: 2.9 g/dL (ref 1.5–4.5)
POTASSIUM: 3.8 mmol/L (ref 3.5–5.2)
Sodium: 147 mmol/L — ABNORMAL HIGH (ref 134–144)
TOTAL PROTEIN: 7.1 g/dL (ref 6.0–8.5)

## 2017-06-10 LAB — LIPID PANEL W/O CHOL/HDL RATIO
CHOLESTEROL TOTAL: 113 mg/dL (ref 100–199)
HDL: 54 mg/dL (ref >39)
LDL Calculated: 43 mg/dL (ref 0–99)
Triglycerides: 81 mg/dL (ref 0–149)
VLDL Cholesterol Cal: 16 mg/dL (ref 5–40)

## 2017-06-10 NOTE — Telephone Encounter (Signed)
Faxed prescription for Lasix x 1 refill per Dr. Vivi Martens last note to stay on medication for now.  For future prescriptions to be faxed to Cardiologist office.

## 2017-06-13 ENCOUNTER — Encounter: Payer: PPO | Attending: Cardiology | Admitting: *Deleted

## 2017-06-13 ENCOUNTER — Encounter: Payer: Self-pay | Admitting: *Deleted

## 2017-06-13 VITALS — Ht 72.1 in | Wt 200.4 lb

## 2017-06-13 DIAGNOSIS — I493 Ventricular premature depolarization: Secondary | ICD-10-CM | POA: Diagnosis not present

## 2017-06-13 DIAGNOSIS — Z48812 Encounter for surgical aftercare following surgery on the circulatory system: Secondary | ICD-10-CM | POA: Insufficient documentation

## 2017-06-13 DIAGNOSIS — Z952 Presence of prosthetic heart valve: Secondary | ICD-10-CM | POA: Diagnosis not present

## 2017-06-13 DIAGNOSIS — Z951 Presence of aortocoronary bypass graft: Secondary | ICD-10-CM | POA: Insufficient documentation

## 2017-06-13 NOTE — Patient Instructions (Signed)
Patient Instructions  Patient Details  Name: John [name] MRN: [id number] Date of Birth: Mar 24, 1940 Referring Provider:  Isaias Cowman, MD  Below are your personal goals for exercise, nutrition, and risk factors. Our goal is to help you stay on track towards obtaining and maintaining these goals. We will be discussing your progress on these goals with you throughout the program.  Initial Exercise Prescription: Initial Exercise Prescription - 06/13/17 1500      Date of Initial Exercise RX and Referring Provider   Date  06/13/17    Referring Provider  Paraschos, Alexander MD      Treadmill   MPH  2    Grade  0    Minutes  15    METs  2.53      Recumbant Bike   Level  1    RPM  50    Watts  14    Minutes  15    METs  2.5      NuStep   Level  1    SPM  80    Minutes  15    METs  2.5      Prescription Details   Frequency (times per week)  3    Duration  Progress to 45 minutes of aerobic exercise without signs/symptoms of physical distress      Intensity   THRR 40-80% of Max Heartrate  103-128    Ratings of Perceived Exertion  11-13    Perceived Dyspnea  0-4      Progression   Progression  Continue to progress workloads to maintain intensity without signs/symptoms of physical distress.      Resistance Training   Training Prescription  Yes    Weight  3 lbs    Reps  10-15       Exercise Goals: Frequency: Be able to perform aerobic exercise two to three times per week in program working toward 2-5 days per week of home exercise.  Intensity: Work with a perceived exertion of 11 (fairly light) - 15 (hard) while following your exercise prescription.  We will make changes to your prescription with you as you progress through the program.   Duration: Be able to do 30 to 45 minutes of continuous aerobic exercise in addition to a 5 minute warm-up and a 5 minute cool-down routine.   Nutrition Goals: Your personal nutrition goals will be established when you do  your nutrition analysis with the dietician.  The following are general nutrition guidelines to follow: Cholesterol < 200mg /day Sodium < 1500mg /day Fiber: Men over 50 yrs - 30 grams per day  Personal Goals: Personal Goals and Risk Factors at Admission - 06/13/17 1420      Core Components/Risk Factors/Patient Goals on Admission    Weight Management  Yes;Weight Maintenance    Intervention  Weight Management: Develop a combined nutrition and exercise program designed to reach desired caloric intake, while maintaining appropriate intake of nutrient and fiber, sodium and fats, and appropriate energy expenditure required for the weight goal.;Weight Management: Provide education and appropriate resources to help participant work on and attain dietary goals.    Admit Weight  200 lb (90.7 kg) maintain 200 lb    Expected Outcomes  Short Term: Continue to assess and modify interventions until short term weight is achieved;Long Term: Adherence to nutrition and physical activity/exercise program aimed toward attainment of established weight goal;Weight Maintenance: Understanding of the daily nutrition guidelines, which includes 25-35% calories from fat, 7% or less cal from saturated  fats, less than 200mg  cholesterol, less than 1.5gm of sodium, & 5 or more servings of fruits and vegetables daily;Understanding recommendations for meals to include 15-35% energy as protein, 25-35% energy from fat, 35-60% energy from carbohydrates, less than 200mg  of dietary cholesterol, 20-35 gm of total fiber daily;Understanding of distribution of calorie intake throughout the day with the consumption of 4-5 meals/snacks    Diabetes  Yes    Intervention  Provide education about signs/symptoms and action to take for hypo/hyperglycemia.;Provide education about proper nutrition, including hydration, and aerobic/resistive exercise prescription along with prescribed medications to achieve blood glucose in normal ranges: Fasting glucose  65-99 mg/dL    Expected Outcomes  Short Term: Participant verbalizes understanding of the signs/symptoms and immediate care of hyper/hypoglycemia, proper foot care and importance of medication, aerobic/resistive exercise and nutrition plan for blood glucose control.;Long Term: Attainment of HbA1C < 7%.    Hypertension  Yes    Intervention  Provide education on lifestyle modifcations including regular physical activity/exercise, weight management, moderate sodium restriction and increased consumption of fresh fruit, vegetables, and low fat dairy, alcohol moderation, and smoking cessation.;Monitor prescription use compliance.    Expected Outcomes  Short Term: Continued assessment and intervention until BP is < 140/43mm HG in hypertensive participants. < 130/24mm HG in hypertensive participants with diabetes, heart failure or chronic kidney disease.;Long Term: Maintenance of blood pressure at goal levels.    Lipids  Yes    Intervention  Provide education and support for participant on nutrition & aerobic/resistive exercise along with prescribed medications to achieve LDL 70mg , HDL >40mg .    Expected Outcomes  Short Term: Participant states understanding of desired cholesterol values and is compliant with medications prescribed. Participant is following exercise prescription and nutrition guidelines.;Long Term: Cholesterol controlled with medications as prescribed, with individualized exercise RX and with personalized nutrition plan. Value goals: LDL < 70mg , HDL > 40 mg.       Tobacco Use Initial Evaluation: Social History   Tobacco Use  Smoking Status Former Smoker  . Last attempt to quit: 12/15/1980  . Years since quitting: 36.5  Smokeless Tobacco Never Used    Exercise Goals and Review: Exercise Goals    Row Name 06/13/17 1554             Exercise Goals   Increase Physical Activity  Yes       Intervention  Provide advice, education, support and counseling about physical  activity/exercise needs.;Develop an individualized exercise prescription for aerobic and resistive training based on initial evaluation findings, risk stratification, comorbidities and participant's personal goals.       Expected Outcomes  Short Term: Attend rehab on a regular basis to increase amount of physical activity.;Long Term: Add in home exercise to make exercise part of routine and to increase amount of physical activity.;Long Term: Exercising regularly at least 3-5 days a week.       Increase Strength and Stamina  Yes       Intervention  Provide advice, education, support and counseling about physical activity/exercise needs.;Develop an individualized exercise prescription for aerobic and resistive training based on initial evaluation findings, risk stratification, comorbidities and participant's personal goals.       Expected Outcomes  Short Term: Increase workloads from initial exercise prescription for resistance, speed, and METs.;Short Term: Perform resistance training exercises routinely during rehab and add in resistance training at home;Long Term: Improve cardiorespiratory fitness, muscular endurance and strength as measured by increased METs and functional capacity (6MWT)  Able to understand and use rate of perceived exertion (RPE) scale  Yes       Intervention  Provide education and explanation on how to use RPE scale       Expected Outcomes  Short Term: Able to use RPE daily in rehab to express subjective intensity level;Long Term:  Able to use RPE to guide intensity level when exercising independently       Able to understand and use Dyspnea scale  Yes       Intervention  Provide education and explanation on how to use Dyspnea scale       Expected Outcomes  Short Term: Able to use Dyspnea scale daily in rehab to express subjective sense of shortness of breath during exertion;Long Term: Able to use Dyspnea scale to guide intensity level when exercising independently        Knowledge and understanding of Target Heart Rate Range (THRR)  Yes       Intervention  Provide education and explanation of THRR including how the numbers were predicted and where they are located for reference       Expected Outcomes  Short Term: Able to state/look up THRR;Short Term: Able to use daily as guideline for intensity in rehab;Long Term: Able to use THRR to govern intensity when exercising independently       Able to check pulse independently  Yes       Intervention  Review the importance of being able to check your own pulse for safety during independent exercise;Provide education and demonstration on how to check pulse in carotid and radial arteries.       Expected Outcomes  Short Term: Able to explain why pulse checking is important during independent exercise;Long Term: Able to check pulse independently and accurately       Understanding of Exercise Prescription  Yes       Intervention  Provide education, explanation, and written materials on patient's individual exercise prescription       Expected Outcomes  Short Term: Able to explain program exercise prescription;Long Term: Able to explain home exercise prescription to exercise independently          Copy of goals given to participant.

## 2017-06-13 NOTE — Progress Notes (Signed)
Cardiac Individual Treatment Plan  Patient Details  Name: TENNYSON WACHA MRN: [id number] Date of Birth: [birth year] Referring Provider:     Cardiac Rehab from 06/13/2017 in [facility] Cardiac and Pulmonary Rehab  Referring Provider  Isaias Cowman MD      Initial Encounter Date:    Cardiac Rehab from 06/13/2017 in [facility] Cardiac and Pulmonary Rehab  Date  06/13/17  Referring Provider  Isaias Cowman MD      Visit Diagnosis: S/P CABG x 4  Patient's Home Medications on Admission:  Current Outpatient Medications:  .  amiodarone (PACERONE) 200 MG tablet, Take '400mg'$  (2 tabs) twice a day for 4 days then '200mg'$  (1 tab) twice a day until we see you in follow-up., Disp: 80 tablet, Rfl: 1 .  Ascorbic Acid (VITAMIN C) 1000 MG tablet, Take 1,000 mg by mouth daily., Disp: , Rfl:  .  aspirin 81 MG EC tablet, Take 1 tablet (81 mg total) by mouth daily., Disp: 30 tablet, Rfl: 0 .  atorvastatin (LIPITOR) 40 MG tablet, Take 1 tablet (40 mg total) by mouth daily at 6 PM., Disp: 30 tablet, Rfl: 1 .  cholecalciferol (VITAMIN D) 1000 units tablet, Take 2,000 Units by mouth daily., Disp: , Rfl:  .  Cyanocobalamin (RA VITAMIN B-12 TR) 1000 MCG TBCR, Take 1,000 mcg by mouth daily. , Disp: , Rfl:  .  furosemide (LASIX) 40 MG tablet, Take 1 tablet (40 mg total) by mouth daily., Disp: 30 tablet, Rfl: 0 .  KLOR-CON M10 10 MEQ tablet, Take 1 tablet by mouth daily., Disp: , Rfl:  .  metoprolol tartrate (LOPRESSOR) 25 MG tablet, Take 1 tablet (25 mg total) by mouth 2 (two) times daily., Disp: 60 tablet, Rfl: 1 .  SitaGLIPtin-MetFORMIN HCl (JANUMET XR) 50-1000 MG TB24, Take 1 tablet by mouth 2 (two) times daily. (Patient taking differently: Take 1 tablet by mouth daily. ), Disp: 180 tablet, Rfl: 3 .  hydrochlorothiazide (MICROZIDE) 12.5 MG capsule, Take 1 capsule (12.5 mg total) by mouth daily. (Patient not taking: Reported on 06/13/2017), Disp: 90 capsule, Rfl: 1 .  oxyCODONE (OXY IR/ROXICODONE) 5 MG immediate  release tablet, Take 1 tablet (5 mg total) by mouth every 6 (six) hours as needed for severe pain., Disp: 30 tablet, Rfl: 0  Past Medical History: Past Medical History:  Diagnosis Date  . Anemia   . Anginal pain ([place])   . Aortic stenosis   . Cancer ([place])    bladder  . Cataracts, bilateral   . CHF (congestive heart failure) ([place])   . Chronic kidney disease   . Coronary artery disease   . Diabetes mellitus without complication ([place])    takes Janumet daily...dx approx. 2009  . Dyspnea   . Dysrhythmia   . Heart murmur   . History of colon polyps    benign  . Hyperlipidemia    takes Lovastatin daily  . Hypertension    takes Hyzaar and Amlodipine daily  . Joint pain   . Medical history non-contributory   . Muscle cramps    occasionally in legs   . Osteoarthritis   . Pre-diabetes   . Urinary urgency     Tobacco Use: Social History   Tobacco Use  Smoking Status Former Smoker  . Last attempt to quit: 12/15/1980  . Years since quitting: 36.5  Smokeless Tobacco Never Used    Labs: Recent Chemical engineer    Labs for ITP Cardiac and Pulmonary Rehab Latest Ref Rng & Units 05/05/2017 05/05/2017 05/05/2017  05/06/2017 06/07/2017   Cholestrol 100 - 199 mg/dL - - - - 113   LDLCALC 0 - 99 mg/dL - - - - 43   HDL >39 mg/dL - - - - 54   Trlycerides 0 - 149 mg/dL - - - - 81   Hemoglobin A1c 4.8 - 5.6 % - - - - -   PHART 7.350 - 7.450 7.349(L) - 7.324(L) - -   PCO2ART 32.0 - 48.0 mmHg 44.4 - 49.6(H) - -   HCO3 20.0 - 28.0 mmol/L 24.1 - 25.4 - -   TCO2 22 - 32 mmol/L '25 25 27 27 '$ -   ACIDBASEDEF 0.0 - 2.0 mmol/L 1.0 - - - -   O2SAT % 97.0 - 97.0 - -       Exercise Target Goals: Date: 06/13/17  Exercise Program Goal: Individual exercise prescription set using results from initial 6 min walk test and THRR while considering  patient's activity barriers and safety.   Exercise Prescription Goal: Initial exercise prescription builds to 30-45 minutes a day of aerobic activity, 2-3 days  per week.  Home exercise guidelines will be given to patient during program as part of exercise prescription that the participant will acknowledge.  Activity Barriers & Risk Stratification: Activity Barriers & Cardiac Risk Stratification - 06/13/17 1440      Activity Barriers & Cardiac Risk Stratification   Activity Barriers  Arthritis;Right Knee Replacement;Left Hip Replacement;Shortness of Breath;Joint Problems;Balance Concerns shoulders need surgery    Cardiac Risk Stratification  Moderate       6 Minute Walk: 6 Minute Walk    Row Name 06/13/17 1551         6 Minute Walk   Phase  Initial     Distance  1220 feet     Walk Time  6 minutes     # of Rest Breaks  0     MPH  2.31     METS  2.5     RPE  12     Perceived Dyspnea   2     VO2 Peak  8.76     Symptoms  Yes (comment)     Comments  left hip pain 6/10, SOB     Resting HR  77 bpm     Resting BP  130/64     Resting Oxygen Saturation   96 %     Exercise Oxygen Saturation  during 6 min walk  98 %     Max Ex. HR  103 bpm     Max Ex. BP  144/74     2 Minute Post BP  136/70        Oxygen Initial Assessment:   Oxygen Re-Evaluation:   Oxygen Discharge (Final Oxygen Re-Evaluation):   Initial Exercise Prescription: Initial Exercise Prescription - 06/13/17 1500      Date of Initial Exercise RX and Referring Provider   Date  06/13/17    Referring Provider  Isaias Cowman MD      Treadmill   MPH  2    Grade  0    Minutes  15    METs  2.53      Recumbant Bike   Level  1    RPM  50    Watts  14    Minutes  15    METs  2.5      NuStep   Level  1    SPM  80    Minutes  15  METs  2.5      Prescription Details   Frequency (times per week)  3    Duration  Progress to 45 minutes of aerobic exercise without signs/symptoms of physical distress      Intensity   THRR 40-80% of Max Heartrate  103-128    Ratings of Perceived Exertion  11-13    Perceived Dyspnea  0-4      Progression   Progression   Continue to progress workloads to maintain intensity without signs/symptoms of physical distress.      Resistance Training   Training Prescription  Yes    Weight  3 lbs    Reps  10-15       Perform Capillary Blood Glucose checks as needed.  Exercise Prescription Changes: Exercise Prescription Changes    Row Name 06/13/17 1400             Response to Exercise   Blood Pressure (Admit)  130/64       Blood Pressure (Exercise)  144/74       Blood Pressure (Exit)  136/70       Heart Rate (Admit)  77 bpm       Heart Rate (Exercise)  103 bpm       Heart Rate (Exit)  87 bpm       Oxygen Saturation (Admit)  96 %       Oxygen Saturation (Exercise)  98 %       Rating of Perceived Exertion (Exercise)  12       Perceived Dyspnea (Exercise)  2       Symptoms  left hip pain 6/10, SOB       Comments  walk test results          Exercise Comments:   Exercise Goals and Review: Exercise Goals    Row Name 06/13/17 1554             Exercise Goals   Increase Physical Activity  Yes       Intervention  Provide advice, education, support and counseling about physical activity/exercise needs.;Develop an individualized exercise prescription for aerobic and resistive training based on initial evaluation findings, risk stratification, comorbidities and participant's personal goals.       Expected Outcomes  Short Term: Attend rehab on a regular basis to increase amount of physical activity.;Long Term: Add in home exercise to make exercise part of routine and to increase amount of physical activity.;Long Term: Exercising regularly at least 3-5 days a week.       Increase Strength and Stamina  Yes       Intervention  Provide advice, education, support and counseling about physical activity/exercise needs.;Develop an individualized exercise prescription for aerobic and resistive training based on initial evaluation findings, risk stratification, comorbidities and participant's personal goals.        Expected Outcomes  Short Term: Increase workloads from initial exercise prescription for resistance, speed, and METs.;Short Term: Perform resistance training exercises routinely during rehab and add in resistance training at home;Long Term: Improve cardiorespiratory fitness, muscular endurance and strength as measured by increased METs and functional capacity (6MWT)       Able to understand and use rate of perceived exertion (RPE) scale  Yes       Intervention  Provide education and explanation on how to use RPE scale       Expected Outcomes  Short Term: Able to use RPE daily in rehab to express subjective intensity level;Long Term:  Able to  use RPE to guide intensity level when exercising independently       Able to understand and use Dyspnea scale  Yes       Intervention  Provide education and explanation on how to use Dyspnea scale       Expected Outcomes  Short Term: Able to use Dyspnea scale daily in rehab to express subjective sense of shortness of breath during exertion;Long Term: Able to use Dyspnea scale to guide intensity level when exercising independently       Knowledge and understanding of Target Heart Rate Range (THRR)  Yes       Intervention  Provide education and explanation of THRR including how the numbers were predicted and where they are located for reference       Expected Outcomes  Short Term: Able to state/look up THRR;Short Term: Able to use daily as guideline for intensity in rehab;Long Term: Able to use THRR to govern intensity when exercising independently       Able to check pulse independently  Yes       Intervention  Review the importance of being able to check your own pulse for safety during independent exercise;Provide education and demonstration on how to check pulse in carotid and radial arteries.       Expected Outcomes  Short Term: Able to explain why pulse checking is important during independent exercise;Long Term: Able to check pulse independently and accurately        Understanding of Exercise Prescription  Yes       Intervention  Provide education, explanation, and written materials on patient's individual exercise prescription       Expected Outcomes  Short Term: Able to explain program exercise prescription;Long Term: Able to explain home exercise prescription to exercise independently          Exercise Goals Re-Evaluation :   Discharge Exercise Prescription (Final Exercise Prescription Changes): Exercise Prescription Changes - 06/13/17 1400      Response to Exercise   Blood Pressure (Admit)  130/64    Blood Pressure (Exercise)  144/74    Blood Pressure (Exit)  136/70    Heart Rate (Admit)  77 bpm    Heart Rate (Exercise)  103 bpm    Heart Rate (Exit)  87 bpm    Oxygen Saturation (Admit)  96 %    Oxygen Saturation (Exercise)  98 %    Rating of Perceived Exertion (Exercise)  12    Perceived Dyspnea (Exercise)  2    Symptoms  left hip pain 6/10, SOB    Comments  walk test results       Nutrition:  Target Goals: Understanding of nutrition guidelines, daily intake of sodium '1500mg'$ , cholesterol '200mg'$ , calories 30% from fat and 7% or less from saturated fats, daily to have 5 or more servings of fruits and vegetables.  Biometrics: Pre Biometrics - 06/13/17 1555      Pre Biometrics   Height  6' 0.1" (1.831 m)    Weight  200 lb 6.4 oz (90.9 kg)    Waist Circumference  40 inches    Hip Circumference  37 inches    Waist to Hip Ratio  1.08 %    BMI (Calculated)  27.11    Single Leg Stand  0.82 seconds        Nutrition Therapy Plan and Nutrition Goals: Nutrition Therapy & Goals - 06/13/17 1430      Intervention Plan   Intervention  Prescribe, educate and counsel regarding individualized  specific dietary modifications aiming towards targeted core components such as weight, hypertension, lipid management, diabetes, heart failure and other comorbidities.;Nutrition handout(s) given to patient.    Expected Outcomes  Short Term Goal:  Understand basic principles of dietary content, such as calories, fat, sodium, cholesterol and nutrients.;Short Term Goal: A plan has been developed with personal nutrition goals set during dietitian appointment.;Long Term Goal: Adherence to prescribed nutrition plan.       Nutrition Assessments: Nutrition Assessments - 06/13/17 1431      MEDFICTS Scores   Pre Score  68       Nutrition Goals Re-Evaluation:   Nutrition Goals Discharge (Final Nutrition Goals Re-Evaluation):   Psychosocial: Target Goals: Acknowledge presence or absence of significant depression and/or stress, maximize coping skills, provide positive support system. Participant is able to verbalize types and ability to use techniques and skills needed for reducing stress and depression.   Initial Review & Psychosocial Screening: Initial Psych Review & Screening - 06/13/17 1438      Initial Review   Current issues with  Current Sleep Concerns      Family Dynamics   Good Support System?  Yes      Barriers   Psychosocial barriers to participate in program  There are no identifiable barriers or psychosocial needs.;The patient should benefit from training in stress management and relaxation.      Screening Interventions   Interventions  To provide support and resources with identified psychosocial needs;Program counselor consult;Encouraged to exercise;Provide feedback about the scores to participant    Expected Outcomes  Short Term goal: Utilizing psychosocial counselor, staff and physician to assist with identification of specific Stressors or current issues interfering with healing process. Setting desired goal for each stressor or current issue identified.;Long Term Goal: Stressors or current issues are controlled or eliminated.;Short Term goal: Identification and review with participant of any Quality of Life or Depression concerns found by scoring the questionnaire.;Long Term goal: The participant improves quality of  Life and PHQ9 Scores as seen by post scores and/or verbalization of changes       Quality of Life Scores:  Quality of Life - 06/13/17 1439      Quality of Life Scores   Health/Function Pre  15.1 %    Socioeconomic Pre  29 %    Psych/Spiritual Pre  23.5 %    Family Pre  27.6 %    GLOBAL Pre  21.23 %      Scores of 19 and below usually indicate a poorer quality of life in these areas.  A difference of  2-3 points is a clinically meaningful difference.  A difference of 2-3 points in the total score of the Quality of Life Index has been associated with significant improvement in overall quality of life, self-image, physical symptoms, and general health in studies assessing change in quality of life.  PHQ-9: Recent Review Flowsheet Data    Depression screen [facility] 2/9 06/13/2017 11/03/2016 11/25/2015 05/19/2015   Decreased Interest 1 0 2 0   Down, Depressed, Hopeless 1 0 0 0   PHQ - 2 Score 2 0 2 0   Altered sleeping 3 - 1 -   Tired, decreased energy 3 - 1 -   Change in appetite 0 - 0 -   Feeling bad or failure about yourself  0 - 0 -   Trouble concentrating 0 - 0 -   Moving slowly or fidgety/restless 0 - 0 -   Suicidal thoughts 0 - 0 -  PHQ-9 Score 8 - 4 -   Difficult doing work/chores Not difficult at all - - -     Interpretation of Total Score  Total Score Depression Severity:  1-4 = Minimal depression, 5-9 = Mild depression, 10-14 = Moderate depression, 15-19 = Moderately severe depression, 20-27 = Severe depression   Psychosocial Evaluation and Intervention:   Psychosocial Re-Evaluation:   Psychosocial Discharge (Final Psychosocial Re-Evaluation):   Vocational Rehabilitation: Provide vocational rehab assistance to qualifying candidates.   Vocational Rehab Evaluation & Intervention: Vocational Rehab - 06/13/17 1440      Initial Vocational Rehab Evaluation & Intervention   Assessment shows need for Vocational Rehabilitation  No       Education: Education Goals:  Education classes will be provided on a variety of topics geared toward better understanding of heart health and risk factor modification. Participant will state understanding/return demonstration of topics presented as noted by education test scores.  Learning Barriers/Preferences: Learning Barriers/Preferences - 06/13/17 1439      Learning Barriers/Preferences   Learning Barriers  Hearing    Learning Preferences  Verbal Instruction       Education Topics:  AED/CPR: - Group verbal and written instruction with the use of models to demonstrate the basic use of the AED with the basic ABC's of resuscitation.   General Nutrition Guidelines/Fats and Fiber: -Group instruction provided by verbal, written material, models and posters to present the general guidelines for heart healthy nutrition. Gives an explanation and review of dietary fats and fiber.   Controlling Sodium/Reading Food Labels: -Group verbal and written material supporting the discussion of sodium use in heart healthy nutrition. Review and explanation with models, verbal and written materials for utilization of the food label.   Exercise Physiology & General Exercise Guidelines: - Group verbal and written instruction with models to review the exercise physiology of the cardiovascular system and associated critical values. Provides general exercise guidelines with specific guidelines to those with heart or lung disease.    Aerobic Exercise & Resistance Training: - Gives group verbal and written instruction on the various components of exercise. Focuses on aerobic and resistive training programs and the benefits of this training and how to safely progress through these programs..   Flexibility, Balance, Mind/Body Relaxation: Provides group verbal/written instruction on the benefits of flexibility and balance training, including mind/body exercise modes such as yoga, pilates and tai chi.  Demonstration and skill practice  provided.   Stress and Anxiety: - Provides group verbal and written instruction about the health risks of elevated stress and causes of high stress.  Discuss the correlation between heart/lung disease and anxiety and treatment options. Review healthy ways to manage with stress and anxiety.   Depression: - Provides group verbal and written instruction on the correlation between heart/lung disease and depressed mood, treatment options, and the stigmas associated with seeking treatment.   Anatomy & Physiology of the Heart: - Group verbal and written instruction and models provide basic cardiac anatomy and physiology, with the coronary electrical and arterial systems. Review of Valvular disease and Heart Failure   Cardiac Procedures: - Group verbal and written instruction to review commonly prescribed medications for heart disease. Reviews the medication, class of the drug, and side effects. Includes the steps to properly store meds and maintain the prescription regimen. (beta blockers and nitrates)   Cardiac Medications I: - Group verbal and written instruction to review commonly prescribed medications for heart disease. Reviews the medication, class of the drug, and side effects. Includes the  steps to properly store meds and maintain the prescription regimen.   Cardiac Medications II: -Group verbal and written instruction to review commonly prescribed medications for heart disease. Reviews the medication, class of the drug, and side effects. (all other drug classes)    Go Sex-Intimacy & Heart Disease, Get SMART - Goal Setting: - Group verbal and written instruction through game format to discuss heart disease and the return to sexual intimacy. Provides group verbal and written material to discuss and apply goal setting through the application of the S.M.A.R.T. Method.   Other Matters of the Heart: - Provides group verbal, written materials and models to describe Stable Angina and  Peripheral Artery. Includes description of the disease process and treatment options available to the cardiac patient.   Exercise & Equipment Safety: - Individual verbal instruction and demonstration of equipment use and safety with use of the equipment.   Cardiac Rehab from 06/13/2017 in [facility] Cardiac and Pulmonary Rehab  Date  06/13/17  Educator  [facility]  Instruction Review Code  1- Verbalizes Understanding      Infection Prevention: - Provides verbal and written material to individual with discussion of infection control including proper hand washing and proper equipment cleaning during exercise session.   Cardiac Rehab from 06/13/2017 in [facility] Cardiac and Pulmonary Rehab  Date  06/13/17  Educator  [facility]  Instruction Review Code  1- Verbalizes Understanding      Falls Prevention: - Provides verbal and written material to individual with discussion of falls prevention and safety.   Cardiac Rehab from 06/13/2017 in [facility] Cardiac and Pulmonary Rehab  Date  06/13/17  Educator  [facility]  Instruction Review Code  1- Verbalizes Understanding      Diabetes: - Individual verbal and written instruction to review signs/symptoms of diabetes, desired ranges of glucose level fasting, after meals and with exercise. Acknowledge that pre and post exercise glucose checks will be done for 3 sessions at entry of program.   Cardiac Rehab from 06/13/2017 in [facility] Cardiac and Pulmonary Rehab  Date  06/13/17  Educator  [facility]  Instruction Review Code  1- Verbalizes Understanding      Know Your Numbers and Risk Factors: -Group verbal and written instruction about important numbers in your health.  Discussion of what are risk factors and how they play a role in the disease process.  Review of Cholesterol, Blood Pressure, Diabetes, and BMI and the role they play in your overall health.   Sleep Hygiene: -Provides group verbal and written instruction about how sleep can affect your health.  Define sleep hygiene, discuss  sleep cycles and impact of sleep habits. Review good sleep hygiene tips.    Other: -Provides group and verbal instruction on various topics (see comments)   Knowledge Questionnaire Score: Knowledge Questionnaire Score - 06/13/17 1439      Knowledge Questionnaire Score   Pre Score  18/28 correct answers reviewed with Lynnae Sandhoff       Core Components/Risk Factors/Patient Goals at Admission: Personal Goals and Risk Factors at Admission - 06/13/17 1420      Core Components/Risk Factors/Patient Goals on Admission    Weight Management  Yes;Weight Maintenance    Intervention  Weight Management: Develop a combined nutrition and exercise program designed to reach desired caloric intake, while maintaining appropriate intake of nutrient and fiber, sodium and fats, and appropriate energy expenditure required for the weight goal.;Weight Management: Provide education and appropriate resources to help participant work on and attain dietary goals.    Admit Weight  200 lb (90.7 kg) maintain 200 lb    Expected Outcomes  Short Term: Continue to assess and modify interventions until short term weight is achieved;Long Term: Adherence to nutrition and physical activity/exercise program aimed toward attainment of established weight goal;Weight Maintenance: Understanding of the daily nutrition guidelines, which includes 25-35% calories from fat, 7% or less cal from saturated fats, less than '200mg'$  cholesterol, less than 1.5gm of sodium, & 5 or more servings of fruits and vegetables daily;Understanding recommendations for meals to include 15-35% energy as protein, 25-35% energy from fat, 35-60% energy from carbohydrates, less than '200mg'$  of dietary cholesterol, 20-35 gm of total fiber daily;Understanding of distribution of calorie intake throughout the day with the consumption of 4-5 meals/snacks    Diabetes  Yes    Intervention  Provide education about signs/symptoms and action to take for hypo/hyperglycemia.;Provide  education about proper nutrition, including hydration, and aerobic/resistive exercise prescription along with prescribed medications to achieve blood glucose in normal ranges: Fasting glucose 65-99 mg/dL    Expected Outcomes  Short Term: Participant verbalizes understanding of the signs/symptoms and immediate care of hyper/hypoglycemia, proper foot care and importance of medication, aerobic/resistive exercise and nutrition plan for blood glucose control.;Long Term: Attainment of HbA1C < 7%.    Hypertension  Yes    Intervention  Provide education on lifestyle modifcations including regular physical activity/exercise, weight management, moderate sodium restriction and increased consumption of fresh fruit, vegetables, and low fat dairy, alcohol moderation, and smoking cessation.;Monitor prescription use compliance.    Expected Outcomes  Short Term: Continued assessment and intervention until BP is < 140/36m HG in hypertensive participants. < 130/872mHG in hypertensive participants with diabetes, heart failure or chronic kidney disease.;Long Term: Maintenance of blood pressure at goal levels.    Lipids  Yes    Intervention  Provide education and support for participant on nutrition & aerobic/resistive exercise along with prescribed medications to achieve LDL '70mg'$ , HDL >'40mg'$ .    Expected Outcomes  Short Term: Participant states understanding of desired cholesterol values and is compliant with medications prescribed. Participant is following exercise prescription and nutrition guidelines.;Long Term: Cholesterol controlled with medications as prescribed, with individualized exercise RX and with personalized nutrition plan. Value goals: LDL < '70mg'$ , HDL > 40 mg.       Core Components/Risk Factors/Patient Goals Review:    Core Components/Risk Factors/Patient Goals at Discharge (Final Review):    ITP Comments: ITP Comments    Row Name 06/13/17 1411           ITP Comments  Med Review completed.  Initial IT[facility] created. [facility]          Comments: Initial ITP

## 2017-06-15 DIAGNOSIS — Z48812 Encounter for surgical aftercare following surgery on the circulatory system: Secondary | ICD-10-CM | POA: Diagnosis not present

## 2017-06-15 DIAGNOSIS — Z951 Presence of aortocoronary bypass graft: Secondary | ICD-10-CM

## 2017-06-15 LAB — GLUCOSE, CAPILLARY
GLUCOSE-CAPILLARY: 106 mg/dL — AB (ref 65–99)
Glucose-Capillary: 189 mg/dL — ABNORMAL HIGH (ref 65–99)

## 2017-06-15 NOTE — Progress Notes (Signed)
Daily Session Note  Patient Details  Name: John [name] MRN: [id number] Date of Birth: 1940/11/04 Referring Provider:     Cardiac Rehab from 06/13/2017 in [facility] Cardiac and Pulmonary Rehab  Referring Provider  Isaias Cowman MD      Encounter Date: 06/15/2017  Check In: Session Check In - 06/15/17 0830      Check-In   Location  [facility]-Cardiac & Pulmonary Rehab    Staff Present  Justin Mend RCP,RRT,BSRT;Heath Lark, RN, BSN, CCRP;Jessica Luan Pulling, MA, RCEP, CCRP, Exercise Physiologist    Supervising physician immediately available to respond to emergencies  See telemetry face sheet for immediately available ER MD    Medication changes reported      No    Fall or balance concerns reported     No    Tobacco Cessation  No Change    Warm-up and Cool-down  Performed on first and last piece of equipment    Resistance Training Performed  Yes    VAD Patient?  No      Pain Assessment   Currently in Pain?  No/denies          Social History   Tobacco Use  Smoking Status Former Smoker  . Last attempt to quit: 12/15/1980  . Years since quitting: 36.5  Smokeless Tobacco Never Used    Goals Met:  Exercise tolerated well Personal goals reviewed No report of cardiac concerns or symptoms Strength training completed today  Goals Unmet:  Not Applicable  Comments: First full day of exercise!  Patient was oriented to gym and equipment including functions, settings, policies, and procedures.  Patient's individual exercise prescription and treatment plan were reviewed.  All starting workloads were established based on the results of the 6 minute walk test done at initial orientation visit.  The plan for exercise progression was also introduced and progression will be customized based on patient's performance and goals.   Dr. Emily Filbert is Medical Director for [facility] and [facility].

## 2017-06-17 ENCOUNTER — Encounter: Payer: PPO | Admitting: *Deleted

## 2017-06-17 DIAGNOSIS — Z48812 Encounter for surgical aftercare following surgery on the circulatory system: Secondary | ICD-10-CM | POA: Diagnosis not present

## 2017-06-17 DIAGNOSIS — Z951 Presence of aortocoronary bypass graft: Secondary | ICD-10-CM

## 2017-06-17 LAB — GLUCOSE, CAPILLARY
Glucose-Capillary: 111 mg/dL — ABNORMAL HIGH (ref 65–99)
Glucose-Capillary: 113 mg/dL — ABNORMAL HIGH (ref 65–99)

## 2017-06-17 NOTE — Progress Notes (Signed)
Daily Session Note  Patient Details  Name: John [name] MRN: [id number] Date of Birth: [birth year] Referring Provider:     Cardiac Rehab from 06/13/2017 in [facility] Cardiac and Pulmonary Rehab  Referring Provider  Isaias Cowman MD      Encounter Date: 06/17/2017  Check In: Session Check In - 06/17/17 0852      Check-In   Location  [facility]-Cardiac & Pulmonary Rehab    Staff Present  Renita Papa, RN BSN;Philisha Weinel Luan Pulling, MA, RCEP, CCRP, Exercise Physiologist;Mandi Zachery Conch, BS, [facility]    Supervising physician immediately available to respond to emergencies  See telemetry face sheet for immediately available ER MD    Medication changes reported      No    Fall or balance concerns reported     No    Warm-up and Cool-down  Performed on first and last piece of equipment    Resistance Training Performed  Yes    VAD Patient?  No      Pain Assessment   Currently in Pain?  No/denies          Social History   Tobacco Use  Smoking Status Former Smoker  . Last attempt to quit: 12/15/1980  . Years since quitting: 36.5  Smokeless Tobacco Never Used    Goals Met:  Independence with exercise equipment Exercise tolerated well No report of cardiac concerns or symptoms Strength training completed today  Goals Unmet:  Not Applicable  Comments: Pt able to follow exercise prescription today without complaint.  Will continue to monitor for progression.    Dr. Emily Filbert is Medical Director for [place] and [facility].

## 2017-06-20 ENCOUNTER — Encounter: Payer: PPO | Admitting: *Deleted

## 2017-06-20 DIAGNOSIS — Z48812 Encounter for surgical aftercare following surgery on the circulatory system: Secondary | ICD-10-CM | POA: Diagnosis not present

## 2017-06-20 DIAGNOSIS — Z951 Presence of aortocoronary bypass graft: Secondary | ICD-10-CM

## 2017-06-20 LAB — GLUCOSE, CAPILLARY
Glucose-Capillary: 163 mg/dL — ABNORMAL HIGH (ref 65–99)
Glucose-Capillary: 138 mg/dL — ABNORMAL HIGH (ref 65–99)

## 2017-06-20 NOTE — Progress Notes (Signed)
Daily Session Note  Patient Details  Name: John [name] MRN: [id number] Date of Birth: 1940-08-26 Referring Provider:     Cardiac Rehab from 06/13/2017 in [facility] Cardiac and Pulmonary Rehab  Referring Provider  Isaias Cowman MD      Encounter Date: 06/20/2017  Check In: Session Check In - 06/20/17 0802      Check-In   Location  [facility]-Cardiac & Pulmonary Rehab    Staff Present  Heath Lark, RN, BSN, CCRP;Zylie Mumaw Luan Pulling, MA, RCEP, CCRP, Exercise Physiologist;Kelly Amedeo Plenty, BS, ACSM CEP, Exercise Physiologist    Supervising physician immediately available to respond to emergencies  See telemetry face sheet for immediately available ER MD    Medication changes reported      No    Fall or balance concerns reported     No    Warm-up and Cool-down  Performed on first and last piece of equipment    Resistance Training Performed  Yes    VAD Patient?  No      Pain Assessment   Currently in Pain?  No/denies          Social History   Tobacco Use  Smoking Status Former Smoker  . Last attempt to quit: 12/15/1980  . Years since quitting: 36.5  Smokeless Tobacco Never Used    Goals Met:  Independence with exercise equipment Exercise tolerated well Personal goals reviewed No report of cardiac concerns or symptoms Strength training completed today  Goals Unmet:  Not Applicable  Comments: Pt able to follow exercise prescription today without complaint.  Will continue to monitor for progression.    Dr. Emily Filbert is Medical Director for [place] and [facility].

## 2017-06-22 ENCOUNTER — Encounter: Payer: Self-pay | Admitting: *Deleted

## 2017-06-22 DIAGNOSIS — Z48812 Encounter for surgical aftercare following surgery on the circulatory system: Secondary | ICD-10-CM | POA: Diagnosis not present

## 2017-06-22 DIAGNOSIS — Z951 Presence of aortocoronary bypass graft: Secondary | ICD-10-CM

## 2017-06-22 NOTE — Progress Notes (Signed)
Daily Session Note  Patient Details  Name: John [name] MRN: [id number] Date of Birth: [birth year] Referring Provider:     Cardiac Rehab from 06/13/2017 in [facility] Cardiac and Pulmonary Rehab  Referring Provider  Isaias Cowman MD      Encounter Date: 06/22/2017  Check In: Session Check In - 06/22/17 0755      Check-In   Location  [facility]-Cardiac & Pulmonary Rehab    Staff Present  Justin Mend RCP,RRT,BSRT;Heath Lark, RN, BSN, CCRP;Jessica Luan Pulling, MA, RCEP, CCRP, Exercise Physiologist    Supervising physician immediately available to respond to emergencies  See telemetry face sheet for immediately available ER MD    Medication changes reported      No    Fall or balance concerns reported     No    Tobacco Cessation  No Change    Warm-up and Cool-down  Performed on first and last piece of equipment    Resistance Training Performed  Yes    VAD Patient?  No      Pain Assessment   Currently in Pain?  No/denies          Social History   Tobacco Use  Smoking Status Former Smoker  . Last attempt to quit: 12/15/1980  . Years since quitting: 36.5  Smokeless Tobacco Never Used    Goals Met:  Independence with exercise equipment Exercise tolerated well No report of cardiac concerns or symptoms Strength training completed today  Goals Unmet:  Not Applicable  Comments: Pt able to follow exercise prescription today without complaint.  Will continue to monitor for progression.   Dr. Emily Filbert is Medical Director for [place] and [facility].

## 2017-06-22 NOTE — Progress Notes (Signed)
Cardiac Individual Treatment Plan  Patient Details  Name: John [name] MRN: [id number] Date of Birth: 01-Jan-1941 Referring Provider:     Cardiac Rehab from 06/13/2017 in [facility] Cardiac and Pulmonary Rehab  Referring Provider  Isaias Cowman MD      Initial Encounter Date:    Cardiac Rehab from 06/13/2017 in [facility] Cardiac and Pulmonary Rehab  Date  06/13/17  Referring Provider  Isaias Cowman MD      Visit Diagnosis: S/P CABG x 4  Patient's Home Medications on Admission:  Current Outpatient Medications:  .  amiodarone (PACERONE) 200 MG tablet, Take 481m (2 tabs) twice a day for 4 days then 203m(1 tab) twice a day until we see you in follow-up., Disp: 80 tablet, Rfl: 1 .  Ascorbic Acid (VITAMIN C) 1000 MG tablet, Take 1,000 mg by mouth daily., Disp: , Rfl:  .  aspirin 81 MG EC tablet, Take 1 tablet (81 mg total) by mouth daily., Disp: 30 tablet, Rfl: 0 .  atorvastatin (LIPITOR) 40 MG tablet, Take 1 tablet (40 mg total) by mouth daily at 6 PM., Disp: 30 tablet, Rfl: 1 .  cholecalciferol (VITAMIN D) 1000 units tablet, Take 2,000 Units by mouth daily., Disp: , Rfl:  .  Cyanocobalamin (RA VITAMIN B-12 TR) 1000 MCG TBCR, Take 1,000 mcg by mouth daily. , Disp: , Rfl:  .  furosemide (LASIX) 40 MG tablet, Take 1 tablet (40 mg total) by mouth daily., Disp: 30 tablet, Rfl: 0 .  hydrochlorothiazide (MICROZIDE) 12.5 MG capsule, Take 1 capsule (12.5 mg total) by mouth daily. (Patient not taking: Reported on 06/13/2017), Disp: 90 capsule, Rfl: 1 .  KLOR-CON M10 10 MEQ tablet, Take 1 tablet by mouth daily., Disp: , Rfl:  .  metoprolol tartrate (LOPRESSOR) 25 MG tablet, Take 1 tablet (25 mg total) by mouth 2 (two) times daily., Disp: 60 tablet, Rfl: 1 .  oxyCODONE (OXY IR/ROXICODONE) 5 MG immediate release tablet, Take 1 tablet (5 mg total) by mouth every 6 (six) hours as needed for severe pain., Disp: 30 tablet, Rfl: 0 .  SitaGLIPtin-MetFORMIN HCl (JANUMET XR) 50-1000 MG TB24, Take 1  tablet by mouth 2 (two) times daily. (Patient taking differently: Take 1 tablet by mouth daily. ), Disp: 180 tablet, Rfl: 3  Past Medical History: Past Medical History:  Diagnosis Date  . Anemia   . Anginal pain ([facility]  . Aortic stenosis   . 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 Cataracts, bilateral   . CHF (congestive heart failure) ([facility]  . Chronic kidney disease   . Coronary artery disease   . Diabetes mellitus without complication ([facility]   takes Janumet daily...dx approx. 2009  . Dyspnea   . Dysrhythmia   . Heart murmur   . History of colon polyps    benign  . Hyperlipidemia    takes Lovastatin daily  . Hypertension    takes Hyzaar and Amlodipine daily  . Joint pain   . Medical history non-contributory   . Muscle cramps    occasionally in legs   . Osteoarthritis   . Pre-diabetes   . Urinary urgency     Tobacco Use: Social History   Tobacco Use  Smoking Status Former Smoker  . Last attempt to quit: 12/15/1980  . Years since quitting: 36.5  Smokeless Tobacco Never Used    Labs: Recent ReChemical engineer  Labs for ITP Cardiac and Pulmonary Rehab Latest Ref Rng & Units 05/05/2017 05/05/2017 05/05/2017  05/06/2017 06/07/2017   Cholestrol 100 - 199 mg/dL - - - - 113   LDLCALC 0 - 99 mg/dL - - - - 43   HDL >39 mg/dL - - - - 54   Trlycerides 0 - 149 mg/dL - - - - 81   Hemoglobin A1c 4.8 - 5.6 % - - - - -   PHART 7.350 - 7.450 7.349(L) - 7.324(L) - -   PCO2ART 32.0 - 48.0 mmHg 44.4 - 49.6(H) - -   HCO3 20.0 - 28.0 mmol/L 24.1 - 25.4 - -   TCO2 22 - 32 mmol/L _0 -   ACIDBASEDEF 0.0 - 2.0 mmol/L 1.0 - - - -   O2SAT % 97.0 - 97.0 - -       Exercise Target Goals:    Exercise Program Goal: Individual exercise prescription set using results from initial 6 min walk test and THRR while considering  patient's activity barriers and safety.   Exercise Prescription Goal: Initial exercise prescription builds to 30-45 minutes a day of aerobic activity, 2-3 days per week.   Home exercise guidelines will be given to patient during program as part of exercise prescription that the participant will acknowledge.  Activity Barriers & Risk Stratification: Activity Barriers & Cardiac Risk Stratification - 06/13/17 1440      Activity Barriers & Cardiac Risk Stratification   Activity Barriers  Arthritis;Right Knee Replacement;Left Hip Replacement;Shortness of Breath;Joint Problems;Balance Concerns shoulders need surgery    Cardiac Risk Stratification  Moderate       6 Minute Walk: 6 Minute Walk    Row Name 06/13/17 1551         6 Minute Walk   Phase  Initial     Distance  1220 feet     Walk Time  6 minutes     # of Rest Breaks  0     MPH  2.31     METS  2.5     RPE  12     Perceived Dyspnea   2     VO2 Peak  8.76     Symptoms  Yes (comment)     Comments  left hip pain 6/10, SOB     Resting HR  77 bpm     Resting BP  130/64     Resting Oxygen Saturation   96 %     Exercise Oxygen Saturation  during 6 min walk  98 %     Max Ex. HR  103 bpm     Max Ex. BP  144/74     2 Minute Post BP  136/70        Oxygen Initial Assessment:   Oxygen Re-Evaluation:   Oxygen Discharge (Final Oxygen Re-Evaluation):   Initial Exercise Prescription: Initial Exercise Prescription - 06/13/17 1500      Date of Initial Exercise RX and Referring Provider   Date  06/13/17    Referring Provider  Isaias Cowman MD      Treadmill   MPH  2    Grade  0    Minutes  15    METs  2.53      Recumbant Bike   Level  1    RPM  50    Watts  14    Minutes  15    METs  2.5      NuStep   Level  1    SPM  80    Minutes  15  METs  2.5      Prescription Details   Frequency (times per week)  3    Duration  Progress to 45 minutes of aerobic exercise without signs/symptoms of physical distress      Intensity   THRR 40-80% of Max Heartrate  103-128    Ratings of Perceived Exertion  11-13    Perceived Dyspnea  0-4      Progression   Progression  Continue to  progress workloads to maintain intensity without signs/symptoms of physical distress.      Resistance Training   Training Prescription  Yes    Weight  3 lbs    Reps  10-15       Perform Capillary Blood Glucose checks as needed.  Exercise Prescription Changes: Exercise Prescription Changes    Row Name 06/13/17 1400 06/15/17 1500           Response to Exercise   Blood Pressure (Admit)  130/64  124/74      Blood Pressure (Exercise)  144/74  130/56      Blood Pressure (Exit)  136/70  126/64      Heart Rate (Admit)  77 bpm  76 bpm      Heart Rate (Exercise)  103 bpm  110 bpm      Heart Rate (Exit)  87 bpm  68 bpm      Oxygen Saturation (Admit)  96 %  -      Oxygen Saturation (Exercise)  98 %  -      Rating of Perceived Exertion (Exercise)  12  13      Perceived Dyspnea (Exercise)  2  -      Symptoms  left hip pain 6/10, SOB  none      Comments  walk test results  first full day of exercise      Duration  -  Progress to 45 minutes of aerobic exercise without signs/symptoms of physical distress      Intensity  -  THRR unchanged        Progression   Progression  -  Continue to progress workloads to maintain intensity without signs/symptoms of physical distress.      Average METs  -  2.7        Resistance Training   Training Prescription  -  Yes      Weight  -  3 lbs      Reps  -  10-15        Recumbant Bike   Level  -  1      Watts  -  19      Minutes  -  15      METs  -  2.7        NuStep   Level  -  2      Minutes  -  15      METs  -  2.7         Exercise Comments: Exercise Comments    Row Name 06/15/17 0831           Exercise Comments  First full day of exercise!  Patient was oriented to gym and equipment including functions, settings, policies, and procedures.  Patient's individual exercise prescription and treatment plan were reviewed.  All starting workloads were established based on the results of the 6 minute walk test done at initial orientation visit.   The plan for exercise progression was also introduced and progression will be customized based  on patient's performance and goals.          Exercise Goals and Review: Exercise Goals    Row Name 06/13/17 1554             Exercise Goals   Increase Physical Activity  Yes       Intervention  Provide advice, education, support and counseling about physical activity/exercise needs.;Develop an individualized exercise prescription for aerobic and resistive training based on initial evaluation findings, risk stratification, comorbidities and participant's personal goals.       Expected Outcomes  Short Term: Attend rehab on a regular basis to increase amount of physical activity.;Long Term: Add in home exercise to make exercise part of routine and to increase amount of physical activity.;Long Term: Exercising regularly at least 3-5 days a week.       Increase Strength and Stamina  Yes       Intervention  Provide advice, education, support and counseling about physical activity/exercise needs.;Develop an individualized exercise prescription for aerobic and resistive training based on initial evaluation findings, risk stratification, comorbidities and participant's personal goals.       Expected Outcomes  Short Term: Increase workloads from initial exercise prescription for resistance, speed, and METs.;Short Term: Perform resistance training exercises routinely during rehab and add in resistance training at home;Long Term: Improve cardiorespiratory fitness, muscular endurance and strength as measured by increased METs and functional capacity (6MWT)       Able to understand and use rate of perceived exertion (RPE) scale  Yes       Intervention  Provide education and explanation on how to use RPE scale       Expected Outcomes  Short Term: Able to use RPE daily in rehab to express subjective intensity level;Long Term:  Able to use RPE to guide intensity level when exercising independently       Able to  understand and use Dyspnea scale  Yes       Intervention  Provide education and explanation on how to use Dyspnea scale       Expected Outcomes  Short Term: Able to use Dyspnea scale daily in rehab to express subjective sense of shortness of breath during exertion;Long Term: Able to use Dyspnea scale to guide intensity level when exercising independently       Knowledge and understanding of Target Heart Rate Range (THRR)  Yes       Intervention  Provide education and explanation of THRR including how the numbers were predicted and where they are located for reference       Expected Outcomes  Short Term: Able to state/look up THRR;Short Term: Able to use daily as guideline for intensity in rehab;Long Term: Able to use THRR to govern intensity when exercising independently       Able to check pulse independently  Yes       Intervention  Review the importance of being able to check your own pulse for safety during independent exercise;Provide education and demonstration on how to check pulse in carotid and radial arteries.       Expected Outcomes  Short Term: Able to explain why pulse checking is important during independent exercise;Long Term: Able to check pulse independently and accurately       Understanding of Exercise Prescription  Yes       Intervention  Provide education, explanation, and written materials on patient's individual exercise prescription       Expected Outcomes  Short Term: Able to explain program exercise  prescription;Long Term: Able to explain home exercise prescription to exercise independently          Exercise Goals Re-Evaluation : Exercise Goals Re-Evaluation    Row Name 06/15/17 0831 06/15/17 1542           Exercise Goal Re-Evaluation   Exercise Goals Review  Understanding of Exercise Prescription;Knowledge and understanding of Target Heart Rate Range (THRR);Able to understand and use rate of perceived exertion (RPE) scale  Increase Physical Activity;Understanding of  Exercise Prescription;Increase Strength and Stamina      Comments  Reviewed RPE scale, THR and program prescription with pt today.  Pt voiced understanding and was given a copy of goals to take home.   Jayko has completed his first full day of exercise.  He is off to a good start and we will continue to monitor his progression during the program.      Expected Outcomes  Short: Use RPE daily to regulate intensity.  Long: Follow program prescription in THR.  Short: Continue to attend rehab regularly.  Long: Continue to follow program prescription.          Discharge Exercise Prescription (Final Exercise Prescription Changes): Exercise Prescription Changes - 06/15/17 1500      Response to Exercise   Blood Pressure (Admit)  124/74    Blood Pressure (Exercise)  130/56    Blood Pressure (Exit)  126/64    Heart Rate (Admit)  76 bpm    Heart Rate (Exercise)  110 bpm    Heart Rate (Exit)  68 bpm    Rating of Perceived Exertion (Exercise)  13    Symptoms  none    Comments  first full day of exercise    Duration  Progress to 45 minutes of aerobic exercise without signs/symptoms of physical distress    Intensity  THRR unchanged      Progression   Progression  Continue to progress workloads to maintain intensity without signs/symptoms of physical distress.    Average METs  2.7      Resistance Training   Training Prescription  Yes    Weight  3 lbs    Reps  10-15      Recumbant Bike   Level  1    Watts  19    Minutes  15    METs  2.7      NuStep   Level  2    Minutes  15    METs  2.7       Nutrition:  Target Goals: Understanding of nutrition guidelines, daily intake of sodium <1544m, cholesterol <2081m calories 30% from fat and 7% or less from saturated fats, daily to have 5 or more servings of fruits and vegetables.  Biometrics: Pre Biometrics - 06/13/17 1555      Pre Biometrics   Height  6' 0.1" (1.831 m)    Weight  200 lb 6.4 oz (90.9 kg)    Waist Circumference  40 inches     Hip Circumference  37 inches    Waist to Hip Ratio  1.08 %    BMI (Calculated)  27.11    Single Leg Stand  0.82 seconds        Nutrition Therapy Plan and Nutrition Goals: Nutrition Therapy & Goals - 06/13/17 1430      Intervention Plan   Intervention  Prescribe, educate and counsel regarding individualized specific dietary modifications aiming towards targeted core components such as weight, hypertension, lipid management, diabetes, heart failure and other comorbidities.;Nutrition handout(s)  given to patient.    Expected Outcomes  Short Term Goal: Understand basic principles of dietary content, such as calories, fat, sodium, cholesterol and nutrients.;Short Term Goal: A plan has been developed with personal nutrition goals set during dietitian appointment.;Long Term Goal: Adherence to prescribed nutrition plan.       Nutrition Assessments: Nutrition Assessments - 06/13/17 1431      MEDFICTS Scores   Pre Score  68       Nutrition Goals Re-Evaluation:   Nutrition Goals Discharge (Final Nutrition Goals Re-Evaluation):   Psychosocial: Target Goals: Acknowledge presence or absence of significant depression and/or stress, maximize coping skills, provide positive support system. Participant is able to verbalize types and ability to use techniques and skills needed for reducing stress and depression.   Initial Review & Psychosocial Screening: Initial Psych Review & Screening - 06/13/17 1438      Initial Review   Current issues with  Current Sleep Concerns      Family Dynamics   Good Support System?  Yes      Barriers   Psychosocial barriers to participate in program  There are no identifiable barriers or psychosocial needs.;The patient should benefit from training in stress management and relaxation.      Screening Interventions   Interventions  To provide support and resources with identified psychosocial needs;Program counselor consult;Encouraged to exercise;Provide  feedback about the scores to participant    Expected Outcomes  Short Term goal: Utilizing psychosocial counselor, staff and physician to assist with identification of specific Stressors or current issues interfering with healing process. Setting desired goal for each stressor or current issue identified.;Long Term Goal: Stressors or current issues are controlled or eliminated.;Short Term goal: Identification and review with participant of any Quality of Life or Depression concerns found by scoring the questionnaire.;Long Term goal: The participant improves quality of Life and PHQ9 Scores as seen by post scores and/or verbalization of changes       Quality of Life Scores:  Quality of Life - 06/13/17 1439      Quality of Life Scores   Health/Function Pre  15.1 %    Socioeconomic Pre  29 %    Psych/Spiritual Pre  23.5 %    Family Pre  27.6 %    GLOBAL Pre  21.23 %      Scores of 19 and below usually indicate a poorer quality of life in these areas.  A difference of  2-3 points is a clinically meaningful difference.  A difference of 2-3 points in the total score of the Quality of Life Index has been associated with significant improvement in overall quality of life, self-image, physical symptoms, and general health in studies assessing change in quality of life.  PHQ-9: Recent Review Flowsheet Data    Depression screen [facility] 2/9 06/13/2017 11/03/2016 11/25/2015 05/19/2015   Decreased Interest 1 0 2 0   Down, Depressed, Hopeless 1 0 0 0   PHQ - 2 Score 2 0 2 0   Altered sleeping 3 - 1 -   Tired, decreased energy 3 - 1 -   Change in appetite 0 - 0 -   Feeling bad or failure about yourself  0 - 0 -   Trouble concentrating 0 - 0 -   Moving slowly or fidgety/restless 0 - 0 -   Suicidal thoughts 0 - 0 -   PHQ-9 Score 8 - 4 -   Difficult doing work/chores Not difficult at all - - -  Interpretation of Total Score  Total Score Depression Severity:  1-4 = Minimal depression, 5-9 = Mild depression,  10-14 = Moderate depression, 15-19 = Moderately severe depression, 20-27 = Severe depression   Psychosocial Evaluation and Intervention:   Psychosocial Re-Evaluation:   Psychosocial Discharge (Final Psychosocial Re-Evaluation):   Vocational Rehabilitation: Provide vocational rehab assistance to qualifying candidates.   Vocational Rehab Evaluation & Intervention: Vocational Rehab - 06/13/17 1440      Initial Vocational Rehab Evaluation & Intervention   Assessment shows need for Vocational Rehabilitation  No       Education: Education Goals: Education classes will be provided on a variety of topics geared toward better understanding of heart health and risk factor modification. Participant will state understanding/return demonstration of topics presented as noted by education test scores.  Learning Barriers/Preferences: Learning Barriers/Preferences - 06/13/17 1439      Learning Barriers/Preferences   Learning Barriers  Hearing    Learning Preferences  Verbal Instruction       Education Topics:  AED/CPR: - Group verbal and written instruction with the use of models to demonstrate the basic use of the AED with the basic ABC's of resuscitation.   General Nutrition Guidelines/Fats and Fiber: -Group instruction provided by verbal, written material, models and posters to present the general guidelines for heart healthy nutrition. Gives an explanation and review of dietary fats and fiber.   Controlling Sodium/Reading Food Labels: -Group verbal and written material supporting the discussion of sodium use in heart healthy nutrition. Review and explanation with models, verbal and written materials for utilization of the food label.   Exercise Physiology & General Exercise Guidelines: - Group verbal and written instruction with models to review the exercise physiology of the cardiovascular system and associated critical values. Provides general exercise guidelines with specific  guidelines to those with heart or lung disease.    Aerobic Exercise & Resistance Training: - Gives group verbal and written instruction on the various components of exercise. Focuses on aerobic and resistive training programs and the benefits of this training and how to safely progress through these programs..   Flexibility, Balance, Mind/Body Relaxation: Provides group verbal/written instruction on the benefits of flexibility and balance training, including mind/body exercise modes such as yoga, pilates and tai chi.  Demonstration and skill practice provided.   Cardiac Rehab from 06/20/2017 in [facility] Cardiac and Pulmonary Rehab  Date  06/20/17  Educator  [facility]  Instruction Review Code  1- Verbalizes Understanding      Stress and Anxiety: - Provides group verbal and written instruction about the health risks of elevated stress and causes of high stress.  Discuss the correlation between heart/lung disease and anxiety and treatment options. Review healthy ways to manage with stress and anxiety.   Depression: - Provides group verbal and written instruction on the correlation between heart/lung disease and depressed mood, treatment options, and the stigmas associated with seeking treatment.   Cardiac Rehab from 06/20/2017 in [facility] Cardiac and Pulmonary Rehab  Date  06/15/17  Educator  [facility]  Instruction Review Code  1- Verbalizes Understanding      Anatomy & Physiology of the Heart: - Group verbal and written instruction and models provide basic cardiac anatomy and physiology, with the coronary electrical and arterial systems. Review of Valvular disease and Heart Failure   Cardiac Procedures: - Group verbal and written instruction to review commonly prescribed medications for heart disease. Reviews the medication, class of the drug, and side effects. Includes the steps to properly store meds  and maintain the prescription regimen. (beta blockers and nitrates)   Cardiac Medications I: - Group  verbal and written instruction to review commonly prescribed medications for heart disease. Reviews the medication, class of the drug, and side effects. Includes the steps to properly store meds and maintain the prescription regimen.   Cardiac Medications II: -Group verbal and written instruction to review commonly prescribed medications for heart disease. Reviews the medication, class of the drug, and side effects. (all other drug classes)    Go Sex-Intimacy & Heart Disease, Get SMART - Goal Setting: - Group verbal and written instruction through game format to discuss heart disease and the return to sexual intimacy. Provides group verbal and written material to discuss and apply goal setting through the application of the S.M.A.R.T. Method.   Other Matters of the Heart: - Provides group verbal, written materials and models to describe Stable Angina and Peripheral Artery. Includes description of the disease process and treatment options available to the cardiac patient.   Exercise & Equipment Safety: - Individual verbal instruction and demonstration of equipment use and safety with use of the equipment.   Cardiac Rehab from 06/20/2017 in [facility] Cardiac and Pulmonary Rehab  Date  06/13/17  Educator  [facility]  Instruction Review Code  1- Verbalizes Understanding      Infection Prevention: - Provides verbal and written material to individual with discussion of infection control including proper hand washing and proper equipment cleaning during exercise session.   Cardiac Rehab from 06/20/2017 in [facility] Cardiac and Pulmonary Rehab  Date  06/13/17  Educator  [facility]  Instruction Review Code  1- Verbalizes Understanding      Falls Prevention: - Provides verbal and written material to individual with discussion of falls prevention and safety.   Cardiac Rehab from 06/20/2017 in [facility] Cardiac and Pulmonary Rehab  Date  06/13/17  Educator  [facility]  Instruction Review Code  1- Verbalizes Understanding       Diabetes: - Individual verbal and written instruction to review signs/symptoms of diabetes, desired ranges of glucose level fasting, after meals and with exercise. Acknowledge that pre and post exercise glucose checks will be done for 3 sessions at entry of program.   Cardiac Rehab from 06/20/2017 in [facility] Cardiac and Pulmonary Rehab  Date  06/13/17  Educator  [facility]  Instruction Review Code  1- Verbalizes Understanding      Know Your Numbers and Risk Factors: -Group verbal and written instruction about important numbers in your health.  Discussion of what are risk factors and how they play a role in the disease process.  Review of Cholesterol, Blood Pressure, Diabetes, and BMI and the role they play in your overall health.   Sleep Hygiene: -Provides group verbal and written instruction about how sleep can affect your health.  Define sleep hygiene, discuss sleep cycles and impact of sleep habits. Review good sleep hygiene tips.    Other: -Provides group and verbal instruction on various topics (see comments)   Knowledge Questionnaire Score: Knowledge Questionnaire Score - 06/13/17 1439      Knowledge Questionnaire Score   Pre Score  18/28 correct answers reviewed with Lynnae Sandhoff       Core Components/Risk Factors/Patient Goals at Admission: Personal Goals and Risk Factors at Admission - 06/13/17 1420      Core Components/Risk Factors/Patient Goals on Admission    Weight Management  Yes;Weight Maintenance    Intervention  Weight Management: Develop a combined nutrition and exercise program designed to reach desired caloric intake,  while maintaining appropriate intake of nutrient and fiber, sodium and fats, and appropriate energy expenditure required for the weight goal.;Weight Management: Provide education and appropriate resources to help participant work on and attain dietary goals.    Admit Weight  200 lb (90.7 kg) maintain 200 lb    Expected Outcomes  Short Term: Continue to assess  and modify interventions until short term weight is achieved;Long Term: Adherence to nutrition and physical activity/exercise program aimed toward attainment of established weight goal;Weight Maintenance: Understanding of the daily nutrition guidelines, which includes 25-35% calories from fat, 7% or less cal from saturated fats, less than 273m cholesterol, less than 1.5gm of sodium, & 5 or more servings of fruits and vegetables daily;Understanding recommendations for meals to include 15-35% energy as protein, 25-35% energy from fat, 35-60% energy from carbohydrates, less than 207mof dietary cholesterol, 20-35 gm of total fiber daily;Understanding of distribution of calorie intake throughout the day with the consumption of 4-5 meals/snacks    Diabetes  Yes    Intervention  Provide education about signs/symptoms and action to take for hypo/hyperglycemia.;Provide education about proper nutrition, including hydration, and aerobic/resistive exercise prescription along with prescribed medications to achieve blood glucose in normal ranges: Fasting glucose 65-99 mg/dL    Expected Outcomes  Short Term: Participant verbalizes understanding of the signs/symptoms and immediate care of hyper/hypoglycemia, proper foot care and importance of medication, aerobic/resistive exercise and nutrition plan for blood glucose control.;Long Term: Attainment of HbA1C < 7%.    Hypertension  Yes    Intervention  Provide education on lifestyle modifcations including regular physical activity/exercise, weight management, moderate sodium restriction and increased consumption of fresh fruit, vegetables, and low fat dairy, alcohol moderation, and smoking cessation.;Monitor prescription use compliance.    Expected Outcomes  Short Term: Continued assessment and intervention until BP is < 140/9090mG in hypertensive participants. < 130/66m51m in hypertensive participants with diabetes, heart failure or chronic kidney disease.;Long Term:  Maintenance of blood pressure at goal levels.    Lipids  Yes    Intervention  Provide education and support for participant on nutrition & aerobic/resistive exercise along with prescribed medications to achieve LDL <70mg27mL >40mg.83mExpected Outcomes  Short Term: Participant states understanding of desired cholesterol values and is compliant with medications prescribed. Participant is following exercise prescription and nutrition guidelines.;Long Term: Cholesterol controlled with medications as prescribed, with individualized exercise RX and with personalized nutrition plan. Value goals: LDL < 70mg, 53m> 40 mg.       Core Components/Risk Factors/Patient Goals Review:    Core Components/Risk Factors/Patient Goals at Discharge (Final Review):    ITP Comments: ITP Comments    Row Name 06/13/17 1411 06/22/17 0615         ITP Comments  Med Review completed. Initial ITP created. Diagnosis can be found in [facility]  30 day review. Continue with ITP unless directed changes per Medical Director   New to program         Comments:

## 2017-06-23 DIAGNOSIS — Z952 Presence of prosthetic heart valve: Secondary | ICD-10-CM | POA: Diagnosis not present

## 2017-06-23 DIAGNOSIS — Z951 Presence of aortocoronary bypass graft: Secondary | ICD-10-CM | POA: Diagnosis not present

## 2017-06-23 DIAGNOSIS — E785 Hyperlipidemia, unspecified: Secondary | ICD-10-CM | POA: Diagnosis not present

## 2017-06-23 DIAGNOSIS — E119 Type 2 diabetes mellitus without complications: Secondary | ICD-10-CM | POA: Diagnosis not present

## 2017-06-23 DIAGNOSIS — I10 Essential (primary) hypertension: Secondary | ICD-10-CM | POA: Diagnosis not present

## 2017-06-23 DIAGNOSIS — I350 Nonrheumatic aortic (valve) stenosis: Secondary | ICD-10-CM | POA: Diagnosis not present

## 2017-06-24 ENCOUNTER — Encounter: Payer: PPO | Admitting: *Deleted

## 2017-06-24 DIAGNOSIS — Z951 Presence of aortocoronary bypass graft: Secondary | ICD-10-CM

## 2017-06-24 DIAGNOSIS — Z48812 Encounter for surgical aftercare following surgery on the circulatory system: Secondary | ICD-10-CM | POA: Diagnosis not present

## 2017-06-24 NOTE — Progress Notes (Signed)
Daily Session Note  Patient Details  Name: John [name] MRN: [id number] Date of Birth: 07-20-1940 Referring Provider:     Cardiac Rehab from 06/13/2017 in [facility] Cardiac and Pulmonary Rehab  Referring Provider  Isaias Cowman MD      Encounter Date: 06/24/2017  Check In: Session Check In - 06/24/17 0823      Check-In   Location  [facility]-Cardiac & Pulmonary Rehab    Staff Present  Alberteen Sam, MA, RCEP, CCRP, Exercise Physiologist;Amanda Oletta Darter, BA, ACSM CEP, Exercise Physiologist;Meredith Sherryll Burger, RN BSN    Supervising physician immediately available to respond to emergencies  See telemetry face sheet for immediately available ER MD    Medication changes reported      No    Fall or balance concerns reported     No    Warm-up and Cool-down  Performed on first and last piece of equipment    Resistance Training Performed  Yes    VAD Patient?  No      Pain Assessment   Currently in Pain?  No/denies          Social History   Tobacco Use  Smoking Status Former Smoker  . Last attempt to quit: 12/15/1980  . Years since quitting: 36.5  Smokeless Tobacco Never Used    Goals Met:  Independence with exercise equipment Exercise tolerated well No report of cardiac concerns or symptoms Strength training completed today  Goals Unmet:  Not Applicable  Comments: Pt able to follow exercise prescription today without complaint.  Will continue to monitor for progression.    Dr. Emily Filbert is Medical Director for [name] and [facility].

## 2017-06-27 ENCOUNTER — Encounter: Payer: PPO | Admitting: *Deleted

## 2017-06-27 DIAGNOSIS — Z48812 Encounter for surgical aftercare following surgery on the circulatory system: Secondary | ICD-10-CM | POA: Diagnosis not present

## 2017-06-27 DIAGNOSIS — Z951 Presence of aortocoronary bypass graft: Secondary | ICD-10-CM

## 2017-06-27 NOTE — Progress Notes (Signed)
Daily Session Note  Patient Details  Name: John [name] MRN: [id number] Date of Birth: 1940-11-28 Referring Provider:     Cardiac Rehab from 06/13/2017 in [facility] Cardiac and Pulmonary Rehab  Referring Provider  Isaias Cowman MD      Encounter Date: 06/27/2017  Check In: Session Check In - 06/27/17 0744      Check-In   Location  [facility]-Cardiac & Pulmonary Rehab    Staff Present  Heath Lark, RN, BSN, CCRP;Bristal Steffy Luan Pulling, MA, RCEP, CCRP, Exercise Physiologist;Kelly Amedeo Plenty, BS, ACSM CEP, Exercise Physiologist    Supervising physician immediately available to respond to emergencies  See telemetry face sheet for immediately available ER MD    Medication changes reported      No    Fall or balance concerns reported     No    Warm-up and Cool-down  Performed on first and last piece of equipment    Resistance Training Performed  Yes    VAD Patient?  No      Pain Assessment   Currently in Pain?  No/denies          Social History   Tobacco Use  Smoking Status Former Smoker  . Last attempt to quit: 12/15/1980  . Years since quitting: 36.5  Smokeless Tobacco Never Used    Goals Met:  Independence with exercise equipment Exercise tolerated well No report of cardiac concerns or symptoms Strength training completed today  Goals Unmet:  Not Applicable  Comments: Pt able to follow exercise prescription today without complaint.  Will continue to monitor for progression.    Dr. Emily Filbert is Medical Director for [place] and [facility].

## 2017-06-29 ENCOUNTER — Encounter: Payer: PPO | Attending: Cardiology

## 2017-06-29 DIAGNOSIS — Z48812 Encounter for surgical aftercare following surgery on the circulatory system: Secondary | ICD-10-CM | POA: Diagnosis not present

## 2017-06-29 DIAGNOSIS — Z951 Presence of aortocoronary bypass graft: Secondary | ICD-10-CM | POA: Insufficient documentation

## 2017-06-29 DIAGNOSIS — Z952 Presence of prosthetic heart valve: Secondary | ICD-10-CM | POA: Diagnosis not present

## 2017-06-29 NOTE — Progress Notes (Signed)
Daily Session Note  Patient Details  Name: John [name] MRN: [id number] Date of Birth: Jan 09, 1941 Referring Provider:     Cardiac Rehab from 06/13/2017 in [facility] Cardiac and Pulmonary Rehab  Referring Provider  Isaias Cowman MD      Encounter Date: 06/29/2017  Check In: Session Check In - 06/29/17 0726      Check-In   Location  [facility]-Cardiac & Pulmonary Rehab    Staff Present  Justin Mend RCP,RRT,BSRT;Heath Lark, RN, BSN, CCRP;Jessica Luan Pulling, MA, RCEP, CCRP, Exercise Physiologist    Supervising physician immediately available to respond to emergencies  See telemetry face sheet for immediately available ER MD    Medication changes reported      No    Fall or balance concerns reported     No    Tobacco Cessation  No Change    Warm-up and Cool-down  Performed on first and last piece of equipment    Resistance Training Performed  Yes    VAD Patient?  No      Pain Assessment   Currently in Pain?  No/denies          Social History   Tobacco Use  Smoking Status Former Smoker  . Last attempt to quit: 12/15/1980  . Years since quitting: 36.5  Smokeless Tobacco Never Used    Goals Met:  Independence with exercise equipment Exercise tolerated well No report of cardiac concerns or symptoms Strength training completed today  Goals Unmet:  Not Applicable  Comments: Pt able to follow exercise prescription today without complaint.  Will continue to monitor for progression.   Dr. Emily Filbert is Medical Director for [place] and [facility].

## 2017-07-01 ENCOUNTER — Encounter: Payer: PPO | Admitting: *Deleted

## 2017-07-01 DIAGNOSIS — Z48812 Encounter for surgical aftercare following surgery on the circulatory system: Secondary | ICD-10-CM | POA: Diagnosis not present

## 2017-07-01 DIAGNOSIS — Z951 Presence of aortocoronary bypass graft: Secondary | ICD-10-CM

## 2017-07-01 NOTE — Progress Notes (Signed)
Daily Session Note  Patient Details  Name: John [name] MRN: [id number] Date of Birth: 1940-12-27 Referring Provider:     Cardiac Rehab from 06/13/2017 in [facility] Cardiac and Pulmonary Rehab  Referring Provider  Isaias Cowman MD      Encounter Date: 07/01/2017  Check In: Session Check In - 07/01/17 0950      Check-In   Location  [facility]-Cardiac & Pulmonary Rehab    Staff Present  Renita Papa, RN BSN;Vishwa Dais Luan Pulling, MA, RCEP, CCRP, Exercise Physiologist;Amanda Oletta Darter, [place], ACSM CEP, Exercise Physiologist    Supervising physician immediately available to respond to emergencies  See telemetry face sheet for immediately available ER MD    Medication changes reported      No    Fall or balance concerns reported     Yes    Warm-up and Cool-down  Performed on first and last piece of equipment    Resistance Training Performed  Yes    VAD Patient?  No      Pain Assessment   Currently in Pain?  No/denies          Social History   Tobacco Use  Smoking Status Former Smoker  . Last attempt to quit: 12/15/1980  . Years since quitting: 36.5  Smokeless Tobacco Never Used    Goals Met:  Exercise tolerated well No report of cardiac concerns or symptoms Strength training completed today  Goals Unmet:  Not Applicable  Comments: Pt able to follow exercise prescription today without complaint.  Will continue to monitor for progression. Reviewed home exercise with pt today.  Pt plans to use recumbent bike at home for exercise.  He is also considering joining [employer] with a friend.  Reviewed THR, pulse, RPE, sign and symptoms, NTG use, and when to call 911 or MD.  Also discussed weather considerations and indoor options.  Pt voiced understanding.    Dr. Emily Filbert is Medical Director for [place] and [facility].

## 2017-07-04 ENCOUNTER — Ambulatory Visit (INDEPENDENT_AMBULATORY_CARE_PROVIDER_SITE_OTHER): Payer: PPO | Admitting: Unknown Physician Specialty

## 2017-07-04 ENCOUNTER — Encounter: Payer: Self-pay | Admitting: Unknown Physician Specialty

## 2017-07-04 ENCOUNTER — Encounter: Payer: PPO | Admitting: *Deleted

## 2017-07-04 VITALS — BP 130/62 | HR 62 | Temp 98.0°F | Ht 71.0 in | Wt 201.8 lb

## 2017-07-04 DIAGNOSIS — D649 Anemia, unspecified: Secondary | ICD-10-CM

## 2017-07-04 DIAGNOSIS — G4700 Insomnia, unspecified: Secondary | ICD-10-CM | POA: Insufficient documentation

## 2017-07-04 DIAGNOSIS — I10 Essential (primary) hypertension: Secondary | ICD-10-CM

## 2017-07-04 DIAGNOSIS — Z79899 Other long term (current) drug therapy: Secondary | ICD-10-CM

## 2017-07-04 DIAGNOSIS — F5101 Primary insomnia: Secondary | ICD-10-CM | POA: Diagnosis not present

## 2017-07-04 DIAGNOSIS — Z951 Presence of aortocoronary bypass graft: Secondary | ICD-10-CM

## 2017-07-04 DIAGNOSIS — Z48812 Encounter for surgical aftercare following surgery on the circulatory system: Secondary | ICD-10-CM | POA: Diagnosis not present

## 2017-07-04 LAB — CBC WITH DIFFERENTIAL/PLATELET
HEMATOCRIT: 30.1 % — AB (ref 37.5–51.0)
HEMOGLOBIN: 9 g/dL — AB (ref 13.0–17.7)
Lymphocytes Absolute: 1.4 10*3/uL (ref 0.7–3.1)
Lymphs: 20 %
MCH: 23.6 pg — ABNORMAL LOW (ref 26.6–33.0)
MCHC: 29.9 g/dL — AB (ref 31.5–35.7)
MCV: 79 fL (ref 79–97)
MID (Absolute): 0.8 10*3/uL (ref 0.1–1.6)
MID: 12 %
NEUTROS PCT: 69 %
Neutrophils Absolute: 4.8 10*3/uL (ref 1.4–7.0)
Platelets: 258 10*3/uL (ref 150–379)
RBC: 3.81 x10E6/uL — ABNORMAL LOW (ref 4.14–5.80)
RDW: 15.8 % — AB (ref 12.3–15.4)
WBC: 7 10*3/uL (ref 3.4–10.8)

## 2017-07-04 MED ORDER — KLOR-CON M10 10 MEQ PO TBCR: 10.0000 meq | EXTENDED_RELEASE_TABLET | Freq: Every day | ORAL | 1 refills | Status: DC

## 2017-07-04 NOTE — Patient Instructions (Signed)
Iron supplement: Slow-FE

## 2017-07-04 NOTE — Progress Notes (Signed)
BP 130/62   Pulse 62   Temp 98 F (36.7 C) (Oral)   Ht 5\' 11"  (1.803 m)   Wt 201 lb 12.8 oz (91.5 kg)   SpO2 98%   BMI 28.15 kg/m    Subjective:    Patient ID: John [name], male    DOB: 11/28/1940, 77 y.o.   MRN: [id number]  HPI: John [name] is a 77 y.o. male  Chief Complaint  Patient presents with  . 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  He has since seen his heart surgeon and unsure about medication changes as apparently never received refills of his heart medication from Dr. Cyndia Bent.   I reviewed notes from Dr. Cyndia Bent and Dr. Josefa Half.  If was recommended by Dr. Cyndia Bent to continue Lasix and Potassium due to diastolic dysfunction and stopping Amiodarone.  Not changes suggested by Dr. Josefa Half.   Med list was reviewed, changes made, and on-current medications thrown away.    Anemia Noted anemia following hospitalization.  H/H improved on recheck but unresolved.    Insomnia Pt states he goes to bed at 10:30P and wide awake at 1:30.  Dozes for a short while after that time.  Admits to sleeping during the day as falls asleep every time he sits down.    Relevant past medical, surgical, family and social history reviewed and updated as indicated. Interim medical history since our last visit reviewed. Allergies and medications reviewed and updated.  Review of Systems  Per HPI unless specifically indicated above     Objective:    BP 130/62   Pulse 62   Temp 98 F (36.7 C) (Oral)   Ht 5\' 11"  (1.803 m)   Wt 201 lb 12.8 oz (91.5 kg)   SpO2 98%   BMI 28.15 kg/m   Wt Readings from Last 3 Encounters:  07/04/17 201 lb 12.8 oz (91.5 kg)  06/13/17 200 lb 6.4 oz (90.9 kg)  06/08/17 199 lb (90.3 kg)    Physical Exam  Constitutional: He is oriented to person, place, and time. He appears well-developed and well-nourished. No distress.  HENT:  Head: Normocephalic and atraumatic.    Eyes: Conjunctivae and lids are normal. Right eye exhibits no discharge. Left eye exhibits no discharge. No scleral icterus.  Neck: Normal range of motion. Neck supple. No JVD present. Carotid bruit is not present.  Cardiovascular: Normal rate and regular rhythm.  Murmur heard.  Systolic murmur is present with a grade of 3/6. Pulmonary/Chest: Effort normal and breath sounds normal. No respiratory distress.  Abdominal: Normal appearance. There is no splenomegaly or hepatomegaly.  Musculoskeletal: Normal range of motion.  Neurological: He is alert and oriented to person, place, and time.  Skin: Skin is warm, dry and intact. No rash noted. No pallor.  Psychiatric: He has a normal mood and affect. His behavior is normal. Judgment and thought content normal.    Results for orders placed or performed in visit on 07/04/17  CBC With Differential/Platelet  Result Value Ref Range   WBC 7.0 3.4 - 10.8 x10E3/uL   RBC 3.81 (L) 4.14 - 5.80 x10E6/uL   Hemoglobin 9.0 (L) 13.0 - 17.7 g/dL   Hematocrit 30.1 (L) 37.5 - 51.0 %   MCV 79 79 - 97 fL   MCH 23.6 (L) 26.6 - 33.0 pg   MCHC 29.9 (L) 31.5 - 35.7 g/dL   RDW 15.8 (  H) 12.3 - 15.4 %   Platelets 258 150 - 379 x10E3/uL   Neutrophils 69 Not Estab. %   Lymphs 20 Not Estab. %   MID 12 Not Estab. %   Neutrophils Absolute 4.8 1.4 - 7.0 x10E3/uL   Lymphocytes Absolute 1.4 0.7 - 3.1 x10E3/uL   MID (Absolute) 0.8 0.1 - 1.6 X10E3/uL      Assessment & Plan:   Problem List Items Addressed This Visit      Unprioritized   Anemia - Primary    H/H 9.0/30.1 and microcytic.  Suspect contributing to fatigue.  Recommended OTC iron supplement twice a day.  Recheck in 1 month.         Relevant Orders   CBC With Differential/Platelet (Completed)   Hypertension    Stable, continue present medications.        Relevant Medications   amLODipine (NORVASC) 5 MG tablet   losartan (COZAAR) 50 MG tablet   Insomnia    Encouraged to avoid napping during the day.   Refuses sleep study.  Encouraged to keep a sleep diary.         Other Visit Diagnoses    Benign hypertension       Relevant Medications   amLODipine (NORVASC) 5 MG tablet   losartan (COZAAR) 50 MG tablet   Other Relevant Orders   Comprehensive metabolic panel   Medication management       Reviewed medications and containers.  Updated med list and old prescriptions removed.  Check CMP   Relevant Orders   Comprehensive metabolic panel       Follow up plan: Return in about 1 month (around 08/01/2017).

## 2017-07-04 NOTE — Assessment & Plan Note (Signed)
Stable, continue present medications.   

## 2017-07-04 NOTE — Assessment & Plan Note (Addendum)
H/H 9.0/30.1 and microcytic.  Suspect contributing to fatigue.  Recommended OTC iron supplement twice a day.  Recheck in 1 month.

## 2017-07-04 NOTE — Progress Notes (Signed)
Daily Session Note  Patient Details  Name: John [name] MRN: [id number] Date of Birth: 05/09/1940 Referring Provider:     Cardiac Rehab from 06/13/2017 in [facility] Cardiac and Pulmonary Rehab  Referring Provider  Isaias Cowman MD      Encounter Date: 07/04/2017  Check In: Session Check In - 07/04/17 0757      Check-In   Location  [facility]-Cardiac & Pulmonary Rehab    Staff Present  Heath Lark, RN, BSN, CCRP;Jessica Luan Pulling, MA, RCEP, CCRP, Exercise Physiologist;Kelly Amedeo Plenty, BS, ACSM CEP, Exercise Physiologist    Supervising physician immediately available to respond to emergencies  See telemetry face sheet for immediately available ER MD    Medication changes reported      No    Fall or balance concerns reported     No    Warm-up and Cool-down  Performed on first and last piece of equipment    Resistance Training Performed  Yes    VAD Patient?  No      Pain Assessment   Currently in Pain?  No/denies          Social History   Tobacco Use  Smoking Status Former Smoker  . Last attempt to quit: 12/15/1980  . Years since quitting: 36.5  Smokeless Tobacco Never Used    Goals Met:  Independence with exercise equipment Exercise tolerated well No report of cardiac concerns or symptoms Strength training completed today  Goals Unmet:  Not Applicable  Comments: Pt able to follow exercise prescription today without complaint.  Will continue to monitor for progression.    Dr. Emily Filbert is Medical Director for [place] and [facility].

## 2017-07-04 NOTE — Assessment & Plan Note (Signed)
Encouraged to avoid napping during the day.  Refuses sleep study.  Encouraged to keep a sleep diary.

## 2017-07-05 LAB — COMPREHENSIVE METABOLIC PANEL
ALT: 17 IU/L (ref 0–44)
AST: 26 IU/L (ref 0–40)
Albumin/Globulin Ratio: 1.4 (ref 1.2–2.2)
Albumin: 3.9 g/dL (ref 3.5–4.8)
Alkaline Phosphatase: 65 IU/L (ref 39–117)
BUN/Creatinine Ratio: 15 (ref 10–24)
BUN: 19 mg/dL (ref 8–27)
Bilirubin Total: 0.4 mg/dL (ref 0.0–1.2)
CO2: 26 mmol/L (ref 20–29)
Calcium: 8.7 mg/dL (ref 8.6–10.2)
Chloride: 102 mmol/L (ref 96–106)
Creatinine, Ser: 1.24 mg/dL (ref 0.76–1.27)
GFR, EST AFRICAN AMERICAN: 64 mL/min/{1.73_m2} (ref >59)
GFR, EST NON AFRICAN AMERICAN: 55 mL/min/{1.73_m2} — AB (ref >59)
GLUCOSE: 114 mg/dL — AB (ref 65–99)
Globulin, Total: 2.8 g/dL (ref 1.5–4.5)
Potassium: 4.3 mmol/L (ref 3.5–5.2)
Sodium: 145 mmol/L — ABNORMAL HIGH (ref 134–144)
TOTAL PROTEIN: 6.7 g/dL (ref 6.0–8.5)

## 2017-07-06 DIAGNOSIS — Z48812 Encounter for surgical aftercare following surgery on the circulatory system: Secondary | ICD-10-CM | POA: Diagnosis not present

## 2017-07-06 DIAGNOSIS — Z951 Presence of aortocoronary bypass graft: Secondary | ICD-10-CM

## 2017-07-06 NOTE — Progress Notes (Signed)
Daily Session Note  Patient Details  Name: John [name] MRN: [id number] Date of Birth: [birth year] Referring Provider:     Cardiac Rehab from 06/13/2017 in [facility] Cardiac and Pulmonary Rehab  Referring Provider  Isaias Cowman MD      Encounter Date: 07/06/2017  Check In: Session Check In - 07/06/17 0756      Check-In   Location  [facility]-Cardiac & Pulmonary Rehab    Staff Present  Justin Mend Lorre Nick, MA, RCEP, CCRP, Exercise Physiologist;Susanne Bice, RN, BSN, CCRP    Supervising physician immediately available to respond to emergencies  See telemetry face sheet for immediately available ER MD    Medication changes reported      No    Fall or balance concerns reported     No    Tobacco Cessation  No Change    Warm-up and Cool-down  Performed on first and last piece of equipment    Resistance Training Performed  Yes    VAD Patient?  No      Pain Assessment   Currently in Pain?  No/denies          Social History   Tobacco Use  Smoking Status Former Smoker  . Last attempt to quit: 12/15/1980  . Years since quitting: 36.5  Smokeless Tobacco Never Used    Goals Met:  Independence with exercise equipment Exercise tolerated well No report of cardiac concerns or symptoms Strength training completed today  Goals Unmet:  Not Applicable  Comments: Pt able to follow exercise prescription today without complaint.  Will continue to monitor for progression.   Dr. Emily Filbert is Medical Director for [place] and [facility].

## 2017-07-12 ENCOUNTER — Encounter: Payer: Self-pay | Admitting: *Deleted

## 2017-07-12 ENCOUNTER — Telehealth: Payer: Self-pay | Admitting: *Deleted

## 2017-07-12 DIAGNOSIS — Z951 Presence of aortocoronary bypass graft: Secondary | ICD-10-CM

## 2017-07-12 NOTE — Telephone Encounter (Signed)
John [name] had called [place] after his class last week to cancel the rest of his classes.  He had said that he got a bill in the mail and could not afford the co-pays.  We called him back to verify that it was a bill and not just a statement of benefits.  We have not heard back from him.  Called again today and left message.

## 2017-07-14 ENCOUNTER — Telehealth: Payer: Self-pay | Admitting: *Deleted

## 2017-07-14 ENCOUNTER — Encounter: Payer: Self-pay | Admitting: *Deleted

## 2017-07-14 DIAGNOSIS — Z951 Presence of aortocoronary bypass graft: Secondary | ICD-10-CM

## 2017-07-14 NOTE — Telephone Encounter (Signed)
John [name] called to let us know that he would like to drop from the program due to his insurance co-pays.  He has joined [employer] and will go there to continue to exercise.

## 2017-07-14 NOTE — Progress Notes (Signed)
Cardiac Individual Treatment Plan  Patient Details  Name: John [name] MRN: [id number] Date of Birth: [birth year] Referring Provider:     Cardiac Rehab from 06/13/2017 in [facility] Cardiac and Pulmonary Rehab  Referring Provider  Isaias Cowman MD      Initial Encounter Date:    Cardiac Rehab from 06/13/2017 in [facility] Cardiac and Pulmonary Rehab  Date  06/13/17  Referring Provider  Isaias Cowman MD      Visit Diagnosis: S/P CABG x 4  Patient's Home Medications on Admission:  Current Outpatient Medications:  .  amLODipine (NORVASC) 5 MG tablet, Take 5 mg by mouth daily., Disp: , Rfl:  .  Ascorbic Acid (VITAMIN C) 1000 MG tablet, Take 1,000 mg by mouth daily., Disp: , Rfl:  .  aspirin 81 MG EC tablet, Take 1 tablet (81 mg total) by mouth daily., Disp: 30 tablet, Rfl: 0 .  atorvastatin (LIPITOR) 40 MG tablet, Take 1 tablet (40 mg total) by mouth daily at 6 PM., Disp: 30 tablet, Rfl: 1 .  cholecalciferol (VITAMIN D) 1000 units tablet, Take 2,000 Units by mouth daily., Disp: , Rfl:  .  Cyanocobalamin (RA VITAMIN B-12 TR) 1000 MCG TBCR, Take 1,000 mcg by mouth daily. , Disp: , Rfl:  .  furosemide (LASIX) 40 MG tablet, Take 1 tablet (40 mg total) by mouth daily., Disp: 30 tablet, Rfl: 0 .  KLOR-CON M10 10 MEQ tablet, Take 1 tablet (10 mEq total) by mouth daily., Disp: 90 tablet, Rfl: 1 .  losartan (COZAAR) 50 MG tablet, Take 50 mg by mouth daily., Disp: , Rfl:  .  metoprolol tartrate (LOPRESSOR) 25 MG tablet, Take 1 tablet (25 mg total) by mouth 2 (two) times daily., Disp: 60 tablet, Rfl: 1 .  SitaGLIPtin-MetFORMIN HCl (JANUMET XR) 50-1000 MG TB24, Take 1 tablet by mouth 2 (two) times daily. (Patient taking differently: Take 1 tablet by mouth daily. ), Disp: 180 tablet, Rfl: 3  Past Medical History: Past Medical History:  Diagnosis Date  . Anemia   . Anginal pain ([place])   . Aortic stenosis   . Cancer ([place])    bladder  . Cataracts, bilateral   . CHF (congestive heart failure)  ([place])   . Chronic kidney disease   . Coronary artery disease   . Diabetes mellitus without complication ([place])    takes Janumet daily...dx approx. 2009  . Dyspnea   . Dysrhythmia   . Heart murmur   . History of colon polyps    benign  . Hyperlipidemia    takes Lovastatin daily  . Hypertension    takes Hyzaar and Amlodipine daily  . Joint pain   . Medical history non-contributory   . Muscle cramps    occasionally in legs   . Osteoarthritis   . Pre-diabetes   . Urinary urgency     Tobacco Use: Social History   Tobacco Use  Smoking Status Former Smoker  . Last attempt to quit: 12/15/1980  . Years since quitting: 36.6  Smokeless Tobacco Never Used    Labs: Recent Chemical engineer    Labs for ITP Cardiac and Pulmonary Rehab Latest Ref Rng & Units 05/05/2017 05/05/2017 05/05/2017 05/06/2017 06/07/2017   Cholestrol 100 - 199 mg/dL - - - - 113   LDLCALC 0 - 99 mg/dL - - - - 43   HDL >39 mg/dL - - - - 54   Trlycerides 0 - 149 mg/dL - - - - 81   Hemoglobin A1c 4.8 - 5.6 % - - - - -  PHART 7.350 - 7.450 7.349(L) - 7.324(L) - -   PCO2ART 32.0 - 48.0 mmHg 44.4 - 49.6(H) - -   HCO3 20.0 - 28.0 mmol/L 24.1 - 25.4 - -   TCO2 22 - 32 mmol/L '25 25 27 27 ' -   ACIDBASEDEF 0.0 - 2.0 mmol/L 1.0 - - - -   O2SAT % 97.0 - 97.0 - -       Exercise Target Goals:    Exercise Program Goal: Individual exercise prescription set using results from initial 6 min walk test and THRR while considering  patient's activity barriers and safety.   Exercise Prescription Goal: Initial exercise prescription builds to 30-45 minutes a day of aerobic activity, 2-3 days per week.  Home exercise guidelines will be given to patient during program as part of exercise prescription that the participant will acknowledge.  Activity Barriers & Risk Stratification: Activity Barriers & Cardiac Risk Stratification - 06/13/17 1440      Activity Barriers & Cardiac Risk Stratification   Activity Barriers   Arthritis;Right Knee Replacement;Left Hip Replacement;Shortness of Breath;Joint Problems;Balance Concerns shoulders need surgery    Cardiac Risk Stratification  Moderate       6 Minute Walk: 6 Minute Walk    Row Name 06/13/17 1551         6 Minute Walk   Phase  Initial     Distance  1220 feet     Walk Time  6 minutes     # of Rest Breaks  0     MPH  2.31     METS  2.5     RPE  12     Perceived Dyspnea   2     VO2 Peak  8.76     Symptoms  Yes (comment)     Comments  left hip pain 6/10, SOB     Resting HR  77 bpm     Resting BP  130/64     Resting Oxygen Saturation   96 %     Exercise Oxygen Saturation  during 6 min walk  98 %     Max Ex. HR  103 bpm     Max Ex. BP  144/74     2 Minute Post BP  136/70        Oxygen Initial Assessment:   Oxygen Re-Evaluation:   Oxygen Discharge (Final Oxygen Re-Evaluation):   Initial Exercise Prescription: Initial Exercise Prescription - 06/13/17 1500      Date of Initial Exercise RX and Referring Provider   Date  06/13/17    Referring Provider  Isaias Cowman MD      Treadmill   MPH  2    Grade  0    Minutes  15    METs  2.53      Recumbant Bike   Level  1    RPM  50    Watts  14    Minutes  15    METs  2.5      NuStep   Level  1    SPM  80    Minutes  15    METs  2.5      Prescription Details   Frequency (times per week)  3    Duration  Progress to 45 minutes of aerobic exercise without signs/symptoms of physical distress      Intensity   THRR 40-80% of Max Heartrate  103-128    Ratings of Perceived Exertion  11-13  Perceived Dyspnea  0-4      Progression   Progression  Continue to progress workloads to maintain intensity without signs/symptoms of physical distress.      Resistance Training   Training Prescription  Yes    Weight  3 lbs    Reps  10-15       Perform Capillary Blood Glucose checks as needed.  Exercise Prescription Changes: Exercise Prescription Changes    Row Name 06/13/17  1400 06/15/17 1500 06/29/17 1500 07/01/17 0900       Response to Exercise   Blood Pressure (Admit)  130/64  124/74  182/62  -    Blood Pressure (Exercise)  144/74  130/56  126/66  -    Blood Pressure (Exit)  136/70  126/64  138/62  -    Heart Rate (Admit)  77 bpm  76 bpm  71 bpm  -    Heart Rate (Exercise)  103 bpm  110 bpm  94 bpm  -    Heart Rate (Exit)  87 bpm  68 bpm  71 bpm  -    Oxygen Saturation (Admit)  96 %  -  -  -    Oxygen Saturation (Exercise)  98 %  -  -  -    Rating of Perceived Exertion (Exercise)  '12  13  11  ' -    Perceived Dyspnea (Exercise)  2  -  -  -    Symptoms  left hip pain 6/10, SOB  none  none  -    Comments  walk test results  first full day of exercise  -  -    Duration  -  Progress to 45 minutes of aerobic exercise without signs/symptoms of physical distress  Continue with 45 min of aerobic exercise without signs/symptoms of physical distress.  -    Intensity  -  THRR unchanged  THRR unchanged  -      Progression   Progression  -  Continue to progress workloads to maintain intensity without signs/symptoms of physical distress.  Continue to progress workloads to maintain intensity without signs/symptoms of physical distress.  -    Average METs  -  2.7  2.84  -      Resistance Training   Training Prescription  -  Yes  Yes  -    Weight  -  3 lbs  3 lbs  -    Reps  -  10-15  10-15  -      Interval Training   Interval Training  -  -  No  -      Treadmill   MPH  -  -  2.5  -    Grade  -  -  1  -    Minutes  -  -  15  -    METs  -  -  3.26  -      Recumbant Bike   Level  -  1  4  -    Watts  -  19  29  -    Minutes  -  15  15  -    METs  -  2.7  3.08  -      NuStep   Level  -  2  3  -    Minutes  -  15  15  -    METs  -  2.7  2.2  -      Home Exercise  Plan   Plans to continue exercise at  -  -  -  Home (comment) recumbent bike, considering [employer]    Frequency  -  -  -  Add 2 additional days to program exercise sessions.    Initial Home  Exercises Provided  -  -  -  07/01/17       Exercise Comments: Exercise Comments    Row Name 06/15/17 0831           Exercise Comments  First full day of exercise!  Patient was oriented to gym and equipment including functions, settings, policies, and procedures.  Patient's individual exercise prescription and treatment plan were reviewed.  All starting workloads were established based on the results of the 6 minute walk test done at initial orientation visit.  The plan for exercise progression was also introduced and progression will be customized based on patient's performance and goals.          Exercise Goals and Review: Exercise Goals    Row Name 06/13/17 1554             Exercise Goals   Increase Physical Activity  Yes       Intervention  Provide advice, education, support and counseling about physical activity/exercise needs.;Develop an individualized exercise prescription for aerobic and resistive training based on initial evaluation findings, risk stratification, comorbidities and participant's personal goals.       Expected Outcomes  Short Term: Attend rehab on a regular basis to increase amount of physical activity.;Long Term: Add in home exercise to make exercise part of routine and to increase amount of physical activity.;Long Term: Exercising regularly at least 3-5 days a week.       Increase Strength and Stamina  Yes       Intervention  Provide advice, education, support and counseling about physical activity/exercise needs.;Develop an individualized exercise prescription for aerobic and resistive training based on initial evaluation findings, risk stratification, comorbidities and participant's personal goals.       Expected Outcomes  Short Term: Increase workloads from initial exercise prescription for resistance, speed, and METs.;Short Term: Perform resistance training exercises routinely during rehab and add in resistance training at home;Long Term: Improve  cardiorespiratory fitness, muscular endurance and strength as measured by increased METs and functional capacity (6MWT)       Able to understand and use rate of perceived exertion (RPE) scale  Yes       Intervention  Provide education and explanation on how to use RPE scale       Expected Outcomes  Short Term: Able to use RPE daily in rehab to express subjective intensity level;Long Term:  Able to use RPE to guide intensity level when exercising independently       Able to understand and use Dyspnea scale  Yes       Intervention  Provide education and explanation on how to use Dyspnea scale       Expected Outcomes  Short Term: Able to use Dyspnea scale daily in rehab to express subjective sense of shortness of breath during exertion;Long Term: Able to use Dyspnea scale to guide intensity level when exercising independently       Knowledge and understanding of Target Heart Rate Range (THRR)  Yes       Intervention  Provide education and explanation of THRR including how the numbers were predicted and where they are located for reference       Expected Outcomes  Short Term: Able to  state/look up THRR;Short Term: Able to use daily as guideline for intensity in rehab;Long Term: Able to use THRR to govern intensity when exercising independently       Able to check pulse independently  Yes       Intervention  Review the importance of being able to check your own pulse for safety during independent exercise;Provide education and demonstration on how to check pulse in carotid and radial arteries.       Expected Outcomes  Short Term: Able to explain why pulse checking is important during independent exercise;Long Term: Able to check pulse independently and accurately       Understanding of Exercise Prescription  Yes       Intervention  Provide education, explanation, and written materials on patient's individual exercise prescription       Expected Outcomes  Short Term: Able to explain program exercise  prescription;Long Term: Able to explain home exercise prescription to exercise independently          Exercise Goals Re-Evaluation : Exercise Goals Re-Evaluation    Row Name 06/15/17 0831 06/15/17 1542 06/29/17 1516 07/01/17 0951       Exercise Goal Re-Evaluation   Exercise Goals Review  Understanding of Exercise Prescription;Knowledge and understanding of Target Heart Rate Range (THRR);Able to understand and use rate of perceived exertion (RPE) scale  Increase Physical Activity;Understanding of Exercise Prescription;Increase Strength and Stamina  Increase Physical Activity;Understanding of Exercise Prescription;Increase Strength and Stamina  Increase Physical Activity;Able to understand and use rate of perceived exertion (RPE) scale;Knowledge and understanding of Target Heart Rate Range (THRR);Understanding of Exercise Prescription    Comments  Reviewed RPE scale, THR and program prescription with pt today.  Pt voiced understanding and was given a copy of goals to take home.   Zac has completed his first full day of exercise.  He is off to a good start and we will continue to monitor his progression during the program.  Durrel is doing well in rehab.  He is already up to 2.5 mph on the treadmill!  We will contnue to monitor his progress.   Reviewed home exercise with pt today.  Pt plans to use recumbent bike at home for exercise.  He is also considering joining [employer] with a friend.  Reviewed THR, pulse, RPE, sign and symptoms, NTG use, and when to call 911 or MD.  Also discussed weather considerations and indoor options.  Pt voiced understanding.    Expected Outcomes  Short: Use RPE daily to regulate intensity.  Long: Follow program prescription in THR.  Short: Continue to attend rehab regularly.  Long: Continue to follow program prescription.   Short: Review home exercise guidelines.  Long: Continue to attend rehab regularly.   Short: Add in home exercise at least two days a week.  Long:  Continue to work on [employer].        Discharge Exercise Prescription (Final Exercise Prescription Changes): Exercise Prescription Changes - 07/01/17 0900      Home Exercise Plan   Plans to continue exercise at  Home (comment) recumbent bike, considering [employer]    Frequency  Add 2 additional days to program exercise sessions.    Initial Home Exercises Provided  07/01/17       Nutrition:  Target Goals: Understanding of nutrition guidelines, daily intake of sodium <1569m, cholesterol <2052m calories 30% from fat and 7% or less from saturated fats, daily to have 5 or more servings of fruits and vegetables.  Biometrics: Pre Biometrics - 06/13/17 1555      Pre Biometrics   Height  6' 0.1" (1.831 m)    Weight  200 lb 6.4 oz (90.9 kg)    Waist Circumference  40 inches    Hip Circumference  37 inches    Waist to Hip Ratio  1.08 %    BMI (Calculated)  27.11    Single Leg Stand  0.82 seconds        Nutrition Therapy Plan and Nutrition Goals: Nutrition Therapy & Goals - 06/27/17 0935      Nutrition Therapy   Diet  DASH/ TLC    Drug/Food Interactions  Statins/Certain Fruits    Protein (specify units)  12oz    Fiber  30 grams    Whole Grain Foods  2 servings    Saturated Fats  15 max. grams    Fruits and Vegetables  5 servings/day 8 ideal    Sodium  2000 grams      Personal Nutrition Goals   Nutrition Goal  Continue to reduce intake of sweets such as Klondike bars, choose these types of foods as occasional treats.    Personal Goal #2  Look to include more sources of complex carbohydrates in the diet such as whole grains and vegetables to best manage your blood glucose levels.    Comments  He reports wt loss of 40-50# since retirement when he and his wife were better able to focus on a healthy eating plan. He eats a variety of proteins, fruits and vegetables. Does not follow a strict diabetic diet      Intervention Plan   Intervention  Prescribe,  educate and counsel regarding individualized specific dietary modifications aiming towards targeted core components such as weight, hypertension, lipid management, diabetes, heart failure and other comorbidities.    Expected Outcomes  Short Term Goal: Understand basic principles of dietary content, such as calories, fat, sodium, cholesterol and nutrients.;Short Term Goal: A plan has been developed with personal nutrition goals set during dietitian appointment.;Long Term Goal: Adherence to prescribed nutrition plan.       Nutrition Assessments: Nutrition Assessments - 06/13/17 1431      MEDFICTS Scores   Pre Score  68       Nutrition Goals Re-Evaluation: Nutrition Goals Re-Evaluation    Row Name 06/27/17 1028             Goals   Nutrition Goal  Look to include more sources of complex carbohydrates in the diet such as whole grains and vegetables to best manage your blood glucose levels.       Comment  Up until recently he was choosing Klondike bars daily along with other sweets       Expected Outcome  He will select sources of complex carbohydrates 80% of the time, and allow for less nutritious / simple sugars like Klondike bars 20% of the time or "occasionally"         Personal Goal #2 Re-Evaluation   Personal Goal #2  Continue to reduce intake of sweets like Klondike bars, choose these as occasional treats. Good job for starting to do this!          Nutrition Goals Discharge (Final Nutrition Goals Re-Evaluation): Nutrition Goals Re-Evaluation - 06/27/17 1028      Goals   Nutrition Goal  Look to include more sources of complex carbohydrates in the diet such as whole grains and vegetables to best manage your blood glucose levels.    Comment  Up until recently he was choosing Klondike bars daily along with other sweets    Expected Outcome  He will select sources of complex carbohydrates 80% of the time, and allow for less nutritious / simple sugars like Klondike bars 20% of the time  or "occasionally"      Personal Goal #2 Re-Evaluation   Personal Goal #2  Continue to reduce intake of sweets like Klondike bars, choose these as occasional treats. Good job for starting to do this!       Psychosocial: Target Goals: Acknowledge presence or absence of significant depression and/or stress, maximize coping skills, provide positive support system. Participant is able to verbalize types and ability to use techniques and skills needed for reducing stress and depression.   Initial Review & Psychosocial Screening: Initial Psych Review & Screening - 06/13/17 1438      Initial Review   Current issues with  Current Sleep Concerns      Family Dynamics   Good Support System?  Yes      Barriers   Psychosocial barriers to participate in program  There are no identifiable barriers or psychosocial needs.;The patient should benefit from training in stress management and relaxation.      Screening Interventions   Interventions  To provide support and resources with identified psychosocial needs;Program counselor consult;Encouraged to exercise;Provide feedback about the scores to participant    Expected Outcomes  Short Term goal: Utilizing psychosocial counselor, staff and physician to assist with identification of specific Stressors or current issues interfering with healing process. Setting desired goal for each stressor or current issue identified.;Long Term Goal: Stressors or current issues are controlled or eliminated.;Short Term goal: Identification and review with participant of any Quality of Life or Depression concerns found by scoring the questionnaire.;Long Term goal: The participant improves quality of Life and PHQ9 Scores as seen by post scores and/or verbalization of changes       Quality of Life Scores:  Quality of Life - 06/13/17 1439      Quality of Life Scores   Health/Function Pre  15.1 %    Socioeconomic Pre  29 %    Psych/Spiritual Pre  23.5 %    Family Pre  27.6  %    GLOBAL Pre  21.23 %      Scores of 19 and below usually indicate a poorer quality of life in these areas.  A difference of  2-3 points is a clinically meaningful difference.  A difference of 2-3 points in the total score of the Quality of Life Index has been associated with significant improvement in overall quality of life, self-image, physical symptoms, and general health in studies assessing change in quality of life.  PHQ-9: Recent Review Flowsheet Data    Depression screen [facility] 2/9 06/13/2017 11/03/2016 11/25/2015 05/19/2015   Decreased Interest 1 0 2 0   Down, Depressed, Hopeless 1 0 0 0   PHQ - 2 Score 2 0 2 0   Altered sleeping 3 - 1 -   Tired, decreased energy 3 - 1 -   Change in appetite 0 - 0 -   Feeling bad or failure about yourself  0 - 0 -   Trouble concentrating 0 - 0 -   Moving slowly or fidgety/restless 0 - 0 -   Suicidal thoughts 0 - 0 -   PHQ-9 Score 8 - 4 -   Difficult doing work/chores Not difficult at all - - -     Interpretation of Total  Score  Total Score Depression Severity:  1-4 = Minimal depression, 5-9 = Mild depression, 10-14 = Moderate depression, 15-19 = Moderately severe depression, 20-27 = Severe depression   Psychosocial Evaluation and Intervention: Psychosocial Evaluation - 06/22/17 0952      Psychosocial Evaluation & Interventions   Interventions  Encouraged to exercise with the program and follow exercise prescription    Comments  Counselor met with Mr. Lott [facility]) for initial psychosocial evaluation.  He is a 77 year old who had a CABGx4 and valve replaced almost 2 months ago.  He has a strong support system with a spouse of 51 years; (2) adult sons; spouse's daughter who lives locally; good friends and active involvement in his local church.  He reports sleeping poorly (~3 hours/night) and napping several hours during the day.  Counselor discussed cutting the naps to 20 minutes and putting more of his sleep for bedtime.  He has a good appetite  and denies a history of depression or anxiety or any current symptoms.  Cullan states he is typically in a positive mood most of the time and has minimal stress in his life other than his current limitations about mowing grass, etc that his Dr. has prohibited currently.  Kieffer has goals to breathe better and return to normal activities.  Staff will follow with him.    Expected Outcomes  Short:  Cardell will begin to cut back on his 1-2 hour naps during the day to 20 minutes/day in order to improve his quality of sleep at night beyond 3 hours.   Long:  [place] will exercise consistently to improve his overall health and be able to return to normal activities in his life.     Continue Psychosocial Services   Follow up required by staff       Psychosocial Re-Evaluation:   Psychosocial Discharge (Final Psychosocial Re-Evaluation):   Vocational Rehabilitation: Provide vocational rehab assistance to qualifying candidates.   Vocational Rehab Evaluation & Intervention: Vocational Rehab - 06/13/17 1440      Initial Vocational Rehab Evaluation & Intervention   Assessment shows need for Vocational Rehabilitation  No       Education: Education Goals: Education classes will be provided on a variety of topics geared toward better understanding of heart health and risk factor modification. Participant will state understanding/return demonstration of topics presented as noted by education test scores.  Learning Barriers/Preferences: Learning Barriers/Preferences - 06/13/17 1439      Learning Barriers/Preferences   Learning Barriers  Hearing    Learning Preferences  Verbal Instruction       Education Topics:  AED/CPR: - Group verbal and written instruction with the use of models to demonstrate the basic use of the AED with the basic ABC's of resuscitation.   General Nutrition Guidelines/Fats and Fiber: -Group instruction provided by verbal, written material, models and posters to present the  general guidelines for heart healthy nutrition. Gives an explanation and review of dietary fats and fiber.   Controlling Sodium/Reading Food Labels: -Group verbal and written material supporting the discussion of sodium use in heart healthy nutrition. Review and explanation with models, verbal and written materials for utilization of the food label.   Exercise Physiology & General Exercise Guidelines: - Group verbal and written instruction with models to review the exercise physiology of the cardiovascular system and associated critical values. Provides general exercise guidelines with specific guidelines to those with heart or lung disease.    Aerobic Exercise & Resistance Training: - Gives group verbal  and written instruction on the various components of exercise. Focuses on aerobic and resistive training programs and the benefits of this training and how to safely progress through these programs..   Flexibility, Balance, Mind/Body Relaxation: Provides group verbal/written instruction on the benefits of flexibility and balance training, including mind/body exercise modes such as yoga, pilates and tai chi.  Demonstration and skill practice provided.   Cardiac Rehab from 07/06/2017 in [facility] Cardiac and Pulmonary Rehab  Date  06/20/17  Educator  [facility]  Instruction Review Code  1- Verbalizes Understanding      Stress and Anxiety: - Provides group verbal and written instruction about the health risks of elevated stress and causes of high stress.  Discuss the correlation between heart/lung disease and anxiety and treatment options. Review healthy ways to manage with stress and anxiety.   Cardiac Rehab from 07/06/2017 in [facility] Cardiac and Pulmonary Rehab  Date  06/29/17  Educator  [facility]  Instruction Review Code  1- Verbalizes Understanding      Depression: - Provides group verbal and written instruction on the correlation between heart/lung disease and depressed mood, treatment options, and the  stigmas associated with seeking treatment.   Cardiac Rehab from 07/06/2017 in [facility] Cardiac and Pulmonary Rehab  Date  06/15/17  Educator  [facility]  Instruction Review Code  1- Verbalizes Understanding      Anatomy & Physiology of the Heart: - Group verbal and written instruction and models provide basic cardiac anatomy and physiology, with the coronary electrical and arterial systems. Review of Valvular disease and Heart Failure   Cardiac Rehab from 07/06/2017 in [facility] Cardiac and Pulmonary Rehab  Date  06/27/17  Educator  [facility]  Instruction Review Code  1- Verbalizes Understanding      Cardiac Procedures: - Group verbal and written instruction to review commonly prescribed medications for heart disease. Reviews the medication, class of the drug, and side effects. Includes the steps to properly store meds and maintain the prescription regimen. (beta blockers and nitrates)   Cardiac Medications I: - Group verbal and written instruction to review commonly prescribed medications for heart disease. Reviews the medication, class of the drug, and side effects. Includes the steps to properly store meds and maintain the prescription regimen.   Cardiac Rehab from 07/06/2017 in [facility] Cardiac and Pulmonary Rehab  Date  07/04/17  Educator  SB  Instruction Review Code  1- Verbalizes Understanding      Cardiac Medications II: -Group verbal and written instruction to review commonly prescribed medications for heart disease. Reviews the medication, class of the drug, and side effects. (all other drug classes)   Cardiac Rehab from 07/06/2017 in [facility] Cardiac and Pulmonary Rehab  Date  06/22/17  Educator  [facility]  Instruction Review Code  1- Verbalizes Understanding       Go Sex-Intimacy & Heart Disease, Get SMART - Goal Setting: - Group verbal and written instruction through game format to discuss heart disease and the return to sexual intimacy. Provides group verbal and written material to discuss and apply goal  setting through the application of the S.M.A.R.T. Method.   Other Matters of the Heart: - Provides group verbal, written materials and models to describe Stable Angina and Peripheral Artery. Includes description of the disease process and treatment options available to the cardiac patient.   Cardiac Rehab from 07/06/2017 in [facility] Cardiac and Pulmonary Rehab  Date  06/27/17  Educator  SB  Instruction Review Code  1- Verbalizes Understanding      Exercise &  Equipment Safety: - Individual verbal instruction and demonstration of equipment use and safety with use of the equipment.   Cardiac Rehab from 07/06/2017 in [facility] Cardiac and Pulmonary Rehab  Date  06/13/17  Educator  [facility]  Instruction Review Code  1- Verbalizes Understanding      Infection Prevention: - Provides verbal and written material to individual with discussion of infection control including proper hand washing and proper equipment cleaning during exercise session.   Cardiac Rehab from 07/06/2017 in [facility] Cardiac and Pulmonary Rehab  Date  06/13/17  Educator  [facility]  Instruction Review Code  1- Verbalizes Understanding      Falls Prevention: - Provides verbal and written material to individual with discussion of falls prevention and safety.   Cardiac Rehab from 07/06/2017 in [facility] Cardiac and Pulmonary Rehab  Date  06/13/17  Educator  [facility]  Instruction Review Code  1- Verbalizes Understanding      Diabetes: - Individual verbal and written instruction to review signs/symptoms of diabetes, desired ranges of glucose level fasting, after meals and with exercise. Acknowledge that pre and post exercise glucose checks will be done for 3 sessions at entry of program.   Cardiac Rehab from 07/06/2017 in [facility] Cardiac and Pulmonary Rehab  Date  06/13/17  Educator  [facility]  Instruction Review Code  1- Verbalizes Understanding      Know Your Numbers and Risk Factors: -Group verbal and written instruction about important numbers in your health.   Discussion of what are risk factors and how they play a role in the disease process.  Review of Cholesterol, Blood Pressure, Diabetes, and BMI and the role they play in your overall health.   Cardiac Rehab from 07/06/2017 in [facility] Cardiac and Pulmonary Rehab  Date  06/22/17  Educator  [facility]  Instruction Review Code  1- Verbalizes Understanding      Sleep Hygiene: -Provides group verbal and written instruction about how sleep can affect your health.  Define sleep hygiene, discuss sleep cycles and impact of sleep habits. Review good sleep hygiene tips.    Other: -Provides group and verbal instruction on various topics (see comments)   Knowledge Questionnaire Score: Knowledge Questionnaire Score - 06/13/17 1439      Knowledge Questionnaire Score   Pre Score  18/28 correct answers reviewed with Lynnae Sandhoff       Core Components/Risk Factors/Patient Goals at Admission: Personal Goals and Risk Factors at Admission - 06/13/17 1420      Core Components/Risk Factors/Patient Goals on Admission    Weight Management  Yes;Weight Maintenance    Intervention  Weight Management: Develop a combined nutrition and exercise program designed to reach desired caloric intake, while maintaining appropriate intake of nutrient and fiber, sodium and fats, and appropriate energy expenditure required for the weight goal.;Weight Management: Provide education and appropriate resources to help participant work on and attain dietary goals.    Admit Weight  200 lb (90.7 kg) maintain 200 lb    Expected Outcomes  Short Term: Continue to assess and modify interventions until short term weight is achieved;Long Term: Adherence to nutrition and physical activity/exercise program aimed toward attainment of established weight goal;Weight Maintenance: Understanding of the daily nutrition guidelines, which includes 25-35% calories from fat, 7% or less cal from saturated fats, less than 29m cholesterol, less than 1.5gm of sodium, & 5 or  more servings of fruits and vegetables daily;Understanding recommendations for meals to include 15-35% energy as protein, 25-35% energy from fat, 35-60% energy from carbohydrates, less than 2033m  of dietary cholesterol, 20-35 gm of total fiber daily;Understanding of distribution of calorie intake throughout the day with the consumption of 4-5 meals/snacks    Diabetes  Yes    Intervention  Provide education about signs/symptoms and action to take for hypo/hyperglycemia.;Provide education about proper nutrition, including hydration, and aerobic/resistive exercise prescription along with prescribed medications to achieve blood glucose in normal ranges: Fasting glucose 65-99 mg/dL    Expected Outcomes  Short Term: Participant verbalizes understanding of the signs/symptoms and immediate care of hyper/hypoglycemia, proper foot care and importance of medication, aerobic/resistive exercise and nutrition plan for blood glucose control.;Long Term: Attainment of HbA1C < 7%.    Hypertension  Yes    Intervention  Provide education on lifestyle modifcations including regular physical activity/exercise, weight management, moderate sodium restriction and increased consumption of fresh fruit, vegetables, and low fat dairy, alcohol moderation, and smoking cessation.;Monitor prescription use compliance.    Expected Outcomes  Short Term: Continued assessment and intervention until BP is < 140/52m HG in hypertensive participants. < 130/827mHG in hypertensive participants with diabetes, heart failure or chronic kidney disease.;Long Term: Maintenance of blood pressure at goal levels.    Lipids  Yes    Intervention  Provide education and support for participant on nutrition & aerobic/resistive exercise along with prescribed medications to achieve LDL <7068mHDL >27m73m  Expected Outcomes  Short Term: Participant states understanding of desired cholesterol values and is compliant with medications prescribed. Participant is  following exercise prescription and nutrition guidelines.;Long Term: Cholesterol controlled with medications as prescribed, with individualized exercise RX and with personalized nutrition plan. Value goals: LDL < 70mg29mL > 40 mg.       Core Components/Risk Factors/Patient Goals Review:    Core Components/Risk Factors/Patient Goals at Discharge (Final Review):    ITP Comments: ITP Comments    Row Name 06/13/17 1411 06/22/17 0615 07/12/17 1454 07/14/17 1052 07/14/17 1053   ITP Comments  Med Review completed. Initial ITP created. Diagnosis can be found in [facility] [place]  30 day review. Continue with ITP unless directed changes per Medical Director   New to program  VernoGraysencalled us af[place]orear his class last week to cancel the rest of his classes.  He had said that he got a bill in the mail and could not afford the co-pays.  We called him back to verify that it was a bill and not just a statement of benefits.  We have not heard back from him.  Called again today and left message.   VernoRoarked to let us kn[place] that he would like to drop from the program due to his insurance co-pays.  [facility] [employer] [facility].   Discharge ITP sent and signed by Dr. MilleSabra Heckscharge Summary routed to PCP and cardiologist.      Comments: Discharge ITP

## 2017-07-14 NOTE — Progress Notes (Addendum)
Discharge Progress Report  Patient Details  Name: John [name] MRN: [id number] Date of Birth: May 20, 1940 Referring Provider:     Cardiac Rehab from 06/13/2017 in [facility] Cardiac and Pulmonary Rehab  Referring Provider  Isaias Cowman MD       Number of Visits: 12/36  Reason for Discharge:  Early Exit:  Insurance  Smoking History:  Social History   Tobacco Use  Smoking Status Former Smoker  . Last attempt to quit: 12/15/1980  . Years since quitting: 36.6  Smokeless Tobacco Never Used    Diagnosis:  S/P CABG x 4  ADL UCSD:   Initial Exercise Prescription: Initial Exercise Prescription - 06/13/17 1500      Date of Initial Exercise RX and Referring Provider   Date  06/13/17    Referring Provider  Isaias Cowman MD      Treadmill   MPH  2    Grade  0    Minutes  15    METs  2.53      Recumbant Bike   Level  1    RPM  50    Watts  14    Minutes  15    METs  2.5      NuStep   Level  1    SPM  80    Minutes  15    METs  2.5      Prescription Details   Frequency (times per week)  3    Duration  Progress to 45 minutes of aerobic exercise without signs/symptoms of physical distress      Intensity   THRR 40-80% of Max Heartrate  103-128    Ratings of Perceived Exertion  11-13    Perceived Dyspnea  0-4      Progression   Progression  Continue to progress workloads to maintain intensity without signs/symptoms of physical distress.      Resistance Training   Training Prescription  Yes    Weight  3 lbs    Reps  10-15       Discharge Exercise Prescription (Final Exercise Prescription Changes): Exercise Prescription Changes - 07/01/17 0900      Home Exercise Plan   Plans to continue exercise at  Home (comment) recumbent bike, considering [employer]    Frequency  Add 2 additional days to program exercise sessions.    Initial Home Exercises Provided  07/01/17       Functional Capacity: 6 Minute Walk    Row Name 06/13/17 1551         6 Minute Walk   Phase  Initial     Distance  1220 feet     Walk Time  6 minutes     # of Rest Breaks  0     MPH  2.31     METS  2.5     RPE  12     Perceived Dyspnea   2     VO2 Peak  8.76     Symptoms  Yes (comment)     Comments  left hip pain 6/10, SOB     Resting HR  77 bpm     Resting BP  130/64     Resting Oxygen Saturation   96 %     Exercise Oxygen Saturation  during 6 min walk  98 %     Max Ex. HR  103 bpm     Max Ex. BP  144/74     2 Minute Post BP  136/70        Psychological, QOL, Others - Outcomes: PHQ 2/9: Depression screen [facility] 2/9 06/13/2017 11/03/2016 11/25/2015 05/19/2015  Decreased Interest 1 0 2 0  Down, Depressed, Hopeless 1 0 0 0  PHQ - 2 Score 2 0 2 0  Altered sleeping 3 - 1 -  Tired, decreased energy 3 - 1 -  Change in appetite 0 - 0 -  Feeling bad or failure about yourself  0 - 0 -  Trouble concentrating 0 - 0 -  Moving slowly or fidgety/restless 0 - 0 -  Suicidal thoughts 0 - 0 -  PHQ-9 Score 8 - 4 -  Difficult doing work/chores Not difficult at all - - -    Quality of Life: Quality of Life - 06/13/17 1439      Quality of Life Scores   Health/Function Pre  15.1 %    Socioeconomic Pre  29 %    Psych/Spiritual Pre  23.5 %    Family Pre  27.6 %    GLOBAL Pre  21.23 %       Personal Goals: Goals established at orientation with interventions provided to work toward goal. Personal Goals and Risk Factors at Admission - 06/13/17 1420      Core Components/Risk Factors/Patient Goals on Admission    Weight Management  Yes;Weight Maintenance    Intervention  Weight Management: Develop a combined nutrition and exercise program designed to reach desired caloric intake, while maintaining appropriate intake of nutrient and fiber, sodium and fats, and appropriate energy expenditure required for the weight goal.;Weight Management: Provide education and appropriate resources to help participant work on and attain dietary goals.    Admit Weight  200 lb  (90.7 kg) maintain 200 lb    Expected Outcomes  Short Term: Continue to assess and modify interventions until short term weight is achieved;Long Term: Adherence to nutrition and physical activity/exercise program aimed toward attainment of established weight goal;Weight Maintenance: Understanding of the daily nutrition guidelines, which includes 25-35% calories from fat, 7% or less cal from saturated fats, less than 200mg  cholesterol, less than 1.5gm of sodium, & 5 or more servings of fruits and vegetables daily;Understanding recommendations for meals to include 15-35% energy as protein, 25-35% energy from fat, 35-60% energy from carbohydrates, less than 200mg  of dietary cholesterol, 20-35 gm of total fiber daily;Understanding of distribution of calorie intake throughout the day with the consumption of 4-5 meals/snacks    Diabetes  Yes    Intervention  Provide education about signs/symptoms and action to take for hypo/hyperglycemia.;Provide education about proper nutrition, including hydration, and aerobic/resistive exercise prescription along with prescribed medications to achieve blood glucose in normal ranges: Fasting glucose 65-99 mg/dL    Expected Outcomes  Short Term: Participant verbalizes understanding of the signs/symptoms and immediate care of hyper/hypoglycemia, proper foot care and importance of medication, aerobic/resistive exercise and nutrition plan for blood glucose control.;Long Term: Attainment of HbA1C < 7%.    Hypertension  Yes    Intervention  Provide education on lifestyle modifcations including regular physical activity/exercise, weight management, moderate sodium restriction and increased consumption of fresh fruit, vegetables, and low fat dairy, alcohol moderation, and smoking cessation.;Monitor prescription use compliance.    Expected Outcomes  Short Term: Continued assessment and intervention until BP is < 140/36mm HG in hypertensive participants. < 130/55mm HG in hypertensive  participants with diabetes, heart failure or chronic kidney disease.;Long Term: Maintenance of blood pressure at goal levels.    Lipids  Yes  Intervention  Provide education and support for participant on nutrition & aerobic/resistive exercise along with prescribed medications to achieve LDL 70mg , HDL >40mg .    Expected Outcomes  Short Term: Participant states understanding of desired cholesterol values and is compliant with medications prescribed. Participant is following exercise prescription and nutrition guidelines.;Long Term: Cholesterol controlled with medications as prescribed, with individualized exercise RX and with personalized nutrition plan. Value goals: LDL < 70mg , HDL > 40 mg.        Personal Goals Discharge:   Exercise Goals and Review: Exercise Goals    Row Name 06/13/17 1554             Exercise Goals   Increase Physical Activity  Yes       Intervention  Provide advice, education, support and counseling about physical activity/exercise needs.;Develop an individualized exercise prescription for aerobic and resistive training based on initial evaluation findings, risk stratification, comorbidities and participant's personal goals.       Expected Outcomes  Short Term: Attend rehab on a regular basis to increase amount of physical activity.;Long Term: Add in home exercise to make exercise part of routine and to increase amount of physical activity.;Long Term: Exercising regularly at least 3-5 days a week.       Increase Strength and Stamina  Yes       Intervention  Provide advice, education, support and counseling about physical activity/exercise needs.;Develop an individualized exercise prescription for aerobic and resistive training based on initial evaluation findings, risk stratification, comorbidities and participant's personal goals.       Expected Outcomes  Short Term: Increase workloads from initial exercise prescription for resistance, speed, and METs.;Short Term:  Perform resistance training exercises routinely during rehab and add in resistance training at home;Long Term: Improve cardiorespiratory fitness, muscular endurance and strength as measured by increased METs and functional capacity (6MWT)       Able to understand and use rate of perceived exertion (RPE) scale  Yes       Intervention  Provide education and explanation on how to use RPE scale       Expected Outcomes  Short Term: Able to use RPE daily in rehab to express subjective intensity level;Long Term:  Able to use RPE to guide intensity level when exercising independently       Able to understand and use Dyspnea scale  Yes       Intervention  Provide education and explanation on how to use Dyspnea scale       Expected Outcomes  Short Term: Able to use Dyspnea scale daily in rehab to express subjective sense of shortness of breath during exertion;Long Term: Able to use Dyspnea scale to guide intensity level when exercising independently       Knowledge and understanding of Target Heart Rate Range (THRR)  Yes       Intervention  Provide education and explanation of THRR including how the numbers were predicted and where they are located for reference       Expected Outcomes  Short Term: Able to state/look up THRR;Short Term: Able to use daily as guideline for intensity in rehab;Long Term: Able to use THRR to govern intensity when exercising independently       Able to check pulse independently  Yes       Intervention  Review the importance of being able to check your own pulse for safety during independent exercise;Provide education and demonstration on how to check pulse in carotid and radial arteries.  Expected Outcomes  Short Term: Able to explain why pulse checking is important during independent exercise;Long Term: Able to check pulse independently and accurately       Understanding of Exercise Prescription  Yes       Intervention  Provide education, explanation, and written materials on  patient's individual exercise prescription       Expected Outcomes  Short Term: Able to explain program exercise prescription;Long Term: Able to explain home exercise prescription to exercise independently          Nutrition & Weight - Outcomes: Pre Biometrics - 06/13/17 1555      Pre Biometrics   Height  6' 0.1" (1.831 m)    Weight  200 lb 6.4 oz (90.9 kg)    Waist Circumference  40 inches    Hip Circumference  37 inches    Waist to Hip Ratio  1.08 %    BMI (Calculated)  27.11    Single Leg Stand  0.82 seconds        Nutrition: Nutrition Therapy & Goals - 06/27/17 0935      Nutrition Therapy   Diet  DASH/ TLC    Drug/Food Interactions  Statins/Certain Fruits    Protein (specify units)  12oz    Fiber  30 grams    Whole Grain Foods  2 servings    Saturated Fats  15 max. grams    Fruits and Vegetables  5 servings/day 8 ideal    Sodium  2000 grams      Personal Nutrition Goals   Nutrition Goal  Continue to reduce intake of sweets such as Klondike bars, choose these types of foods as occasional treats.    Personal Goal #2  Look to include more sources of complex carbohydrates in the diet such as whole grains and vegetables to best manage your blood glucose levels.    Comments  He reports wt loss of 40-50# since retirement when he and his wife were better able to focus on a healthy eating plan. He eats a variety of proteins, fruits and vegetables. Does not follow a strict diabetic diet      Intervention Plan   Intervention  Prescribe, educate and counsel regarding individualized specific dietary modifications aiming towards targeted core components such as weight, hypertension, lipid management, diabetes, heart failure and other comorbidities.    Expected Outcomes  Short Term Goal: Understand basic principles of dietary content, such as calories, fat, sodium, cholesterol and nutrients.;Short Term Goal: A plan has been developed with personal nutrition goals set during dietitian  appointment.;Long Term Goal: Adherence to prescribed nutrition plan.       Nutrition Discharge: Nutrition Assessments - 06/13/17 1431      MEDFICTS Scores   Pre Score  68       Education Questionnaire Score: Knowledge Questionnaire Score - 06/13/17 1439      Knowledge Questionnaire Score   Pre Score  18/28 correct answers reviewed with Lynnae Sandhoff       Goals reviewed with patient; copy given to patient.

## 2017-08-05 ENCOUNTER — Ambulatory Visit: Payer: PPO | Admitting: Unknown Physician Specialty

## 2017-08-24 ENCOUNTER — Ambulatory Visit: Payer: PPO | Admitting: Urology

## 2017-08-24 ENCOUNTER — Encounter: Payer: Self-pay | Admitting: Urology

## 2017-08-24 VITALS — BP 128/67 | HR 66 | Ht 71.0 in | Wt 205.4 lb

## 2017-08-24 DIAGNOSIS — Z8551 Personal history of malignant neoplasm of bladder: Secondary | ICD-10-CM | POA: Diagnosis not present

## 2017-08-24 NOTE — Progress Notes (Signed)
08/24/2017 3:01 PM   [name] Jun 29, 1940 [id number]  Referring provider: Kathrine Haddock, NP [address], [place] [address]  Chief Complaint  Patient presents with  . Bladder Cancer    HPI: John [name] is a 77 year old male who presents to establish urologic care for a history of bladder cancer.  He has previously been followed by Dr. Sherral Hammers and Edd Arbour with a summary of care as follows:  Urologic cancer history: 03/2013: distal right ureterectomy and reimplant for low grade UTUC 03/2014: TURBT, LGTa 04/2016: TURBT, PUNLMP 06/2016: surveillance cysto normal 10/2016: surveillance cysto normal  His last surveillance cystoscopy was in January 2019 and showed a small patchy area on the right lateral wall that was felt suspicious for a low-grade Ta recurrence.  After discussing options he elected continue surveillance and was due for a follow-up cystoscopy in May 2019.  He elected local care due to the distance to [facility].  He has no bothersome lower urinary tract symptoms.  Denies gross hematuria.   PMH: Past Medical History:  Diagnosis Date  . Anemia   . Anginal pain ([place])   . Aortic stenosis   . Cancer ([place])    bladder  . Cataracts, bilateral   . CHF (congestive heart failure) ([place])   . Chronic kidney disease   . Coronary artery disease   . Diabetes mellitus without complication ([place])    takes Janumet daily...dx approx. 2009  . Dyspnea   . Dysrhythmia   . Heart murmur   . History of colon polyps    benign  . Hyperlipidemia    takes Lovastatin daily  . Hypertension    takes Hyzaar and Amlodipine daily  . Joint pain   . Medical history non-contributory   . Muscle cramps    occasionally in legs   . Osteoarthritis   . Pre-diabetes   . Urinary urgency     Surgical History: Past Surgical History:  Procedure Laterality Date  . AORTIC VALVE REPLACEMENT N/A 05/05/2017   Procedure: AORTIC VALVE REPLACEMENT (AVR) USING MAGNA EASE PERICARDIAL BIOPROSTHESIS - AORTIC  VALVE MODEL 3300TFX, SIZE 23 MM;  Surgeon: Gaye Pollack, MD;  Location: [facility];  Service: Open Heart Surgery;  Laterality: N/A;  . BLADDER SURGERY    . CARDIAC VALVE REPLACEMENT    . COLONOSCOPY    . CORONARY ARTERY BYPASS GRAFT N/A 05/05/2017   Procedure: CORONARY ARTERY BYPASS GRAFTING (CABG) x 4 WITH ENDOSCOPIC HARVESTING OF RIGHT SAPHENOUS VEIN;  Surgeon: Gaye Pollack, MD;  Location: [facility];  Service: Open Heart Surgery;  Laterality: N/A;  . CYSTOURETHROSCOPY    . JOINT REPLACEMENT Left    hip  . KNEE ARTHROPLASTY Right 01/12/2016   Procedure: RIGHT  TOTAL KNEE ARTHROPLASTY WITH COMPUTER NAVIGATION;  Surgeon: Rod Can, MD;  Location: [facility];  Service: Orthopedics;  Laterality: Right;  Needs RNFA  . NO PAST SURGERIES    . RIGHT/LEFT HEART CATH AND CORONARY ANGIOGRAPHY N/A 04/21/2017   Procedure: RIGHT/LEFT HEART CATH AND CORONARY ANGIOGRAPHY;  Surgeon: Isaias Cowman, MD;  Location: [facility];  Service: Cardiovascular;  Laterality: N/A;  . SKIN SURGERY Left    arm  . TEE WITHOUT CARDIOVERSION N/A 05/05/2017   Procedure: TRANSESOPHAGEAL ECHOCARDIOGRAM (TEE);  Surgeon: Gaye Pollack, MD;  Location: [facility];  Service: Open Heart Surgery;  Laterality: N/A;    Home Medications:  Allergies as of 08/24/2017      Reactions   Aspirin Other (See Comments)   bruising  Medication List        Accurate as of 08/24/17  3:01 PM. Always use your most recent med list.          amLODipine 5 MG tablet Commonly known as:  NORVASC Take 5 mg by mouth daily.   aspirin 81 MG EC tablet Take 1 tablet (81 mg total) by mouth daily.   atorvastatin 40 MG tablet Commonly known as:  LIPITOR Take 1 tablet (40 mg total) by mouth daily at 6 PM.   cholecalciferol 1000 units tablet Commonly known as:  VITAMIN D Take 2,000 Units by mouth daily.   furosemide 40 MG tablet Commonly known as:  LASIX Take 1 tablet (40 mg total) by mouth daily.   KLOR-CON M10 10 MEQ tablet Generic  drug:  potassium chloride Take 1 tablet (10 mEq total) by mouth daily.   losartan 50 MG tablet Commonly known as:  COZAAR Take 50 mg by mouth daily.   metoprolol tartrate 25 MG tablet Commonly known as:  LOPRESSOR Take 1 tablet (25 mg total) by mouth 2 (two) times daily.   RA VITAMIN B-12 TR 1000 MCG Tbcr Generic drug:  Cyanocobalamin Take 1,000 mcg by mouth daily.   SitaGLIPtin-MetFORMIN HCl 50-1000 MG Tb24 Commonly known as:  JANUMET XR Take 1 tablet by mouth 2 (two) times daily.   vitamin C 1000 MG tablet Take 1,000 mg by mouth daily.       Allergies:  Allergies  Allergen Reactions  . Aspirin Other (See Comments)    bruising    Family History: Family History  Problem Relation Age of Onset  . Dementia Mother   . Stroke Mother   . Stroke Father   . Diabetes Father   . Heart disease Father   . Cancer Sister   . Hypertension Brother   . Cancer Sister     Social History:  reports that he quit smoking about 36 years ago. He quit after 25.00 years of use. He has never used smokeless tobacco. He reports that he does not drink alcohol or use drugs.  ROS: UROLOGY Frequent Urination?: No Hard to postpone urination?: No Burning/pain with urination?: No Get up at night to urinate?: Yes Leakage of urine?: No Urine stream starts and stops?: Yes Trouble starting stream?: No Do you have to strain to urinate?: No Blood in urine?: No Urinary tract infection?: No Sexually transmitted disease?: No Injury to kidneys or bladder?: No Painful intercourse?: No Weak stream?: Yes Erection problems?: Yes Penile pain?: No  Gastrointestinal Nausea?: No Vomiting?: No Indigestion/heartburn?: No Diarrhea?: No Constipation?: No  Constitutional Fever: No Night sweats?: No Weight loss?: No Fatigue?: No  Skin Skin rash/lesions?: Yes Itching?: Yes  Eyes Blurred vision?: No Double vision?: No  Ears/Nose/Throat Sore throat?: No Sinus problems?:  No  Hematologic/Lymphatic Swollen glands?: No Easy bruising?: Yes  Cardiovascular Leg swelling?: Yes Chest pain?: No  Respiratory Cough?: No Shortness of breath?: Yes  Endocrine Excessive thirst?: No  Musculoskeletal Back pain?: Yes Joint pain?: Yes  Neurological Headaches?: No Dizziness?: No  Psychologic Depression?: No Anxiety?: No  Physical Exam: BP 128/67 (BP Location: Left Arm, Patient Position: Sitting, Cuff Size: Large)   Pulse 66   Ht 5\' 11"  (1.803 m)   Wt 205 lb 6.4 oz (93.2 kg)   SpO2 99%   BMI 28.65 kg/m   Constitutional:  Alert and oriented, No acute distress. HEENT: [place] AT, moist mucus membranes.  Trachea midline, no masses. Cardiovascular: No clubbing, cyanosis, or edema. Respiratory: Normal  respiratory effort, no increased work of breathing. GI: Abdomen is soft, nontender, nondistended, no abdominal masses GU: No CVA tenderness Lymph: No cervical or inguinal lymphadenopathy. Skin: No rashes, bruises or suspicious lesions. Neurologic: Grossly intact, no focal deficits, moving all 4 extremities. Psychiatric: Normal mood and affect.   Assessment & Plan:   77 year old male with history of low-grade urothelial carcinoma.  Will schedule follow-up cystoscopy ASAP.  Abbie Sons, [place] [address], [place], [place] [address] [phone]

## 2017-08-25 ENCOUNTER — Encounter: Payer: Self-pay | Admitting: Urology

## 2017-08-25 ENCOUNTER — Ambulatory Visit: Payer: PPO | Admitting: Urology

## 2017-08-25 VITALS — BP 113/56 | HR 71 | Ht 71.0 in | Wt 206.9 lb

## 2017-08-25 DIAGNOSIS — Z8551 Personal history of malignant neoplasm of bladder: Secondary | ICD-10-CM

## 2017-08-25 MED ORDER — CIPROFLOXACIN HCL 500 MG PO TABS
500.0000 mg | ORAL_TABLET | Freq: Once | ORAL | Status: AC
  Administered 2017-08-25: 500 mg via ORAL

## 2017-08-25 MED ORDER — LIDOCAINE HCL URETHRAL/MUCOSAL 2 % EX GEL
1.0000 "application " | Freq: Once | CUTANEOUS | Status: AC
  Administered 2017-08-25: 1 via URETHRAL

## 2017-08-25 NOTE — Progress Notes (Signed)
   08/25/17  CC:  Chief Complaint  Patient presents with  . Cysto    HPI: F/u for cystoscopy. His last surveillance cystoscopy was in January 2019 with Dr. Edd Arbour in [facility] and showed a small patchy area on the right lateral wall that was felt suspicious for a low-grade Ta recurrence.  After discussing options he elected continue surveillance and was due for a follow-up cystoscopy in May 2019. Dr. Edd Arbour was also planning upper tract surveillance with CT. Last bun was 19, Cr 1.24 with gfr 55-64.   Urologic cancer history: 03/2013: distal right ureterectomy and reimplant for low grade UTUC 03/2014: TURBT, LGTa 04/2016: TURBT, PUNLMP 06/2016: surveillance cysto normal 10/2016: surveillance cysto normal  Symptoms: No recent hematuria, bone pain, weight loss or somatic complaints.   Upper tract imaging:  07/05/2016: No definite evidence of recurrent or metastatic disease noted in the abdomen or pelvis, though urinary bladder obscured by metallic streak artifact from left hip arthroplasty. Mild urinary bladder wall thickening noted superolaterally to the right, similar to prior and possibly postsurgical.  There were no vitals taken for this visit. NED. A&Ox3.   No respiratory distress   Abd soft, NT, ND Normal phallus with bilateral descended testicles  Cystoscopy Procedure Note  Patient identification was confirmed, informed consent was obtained, and patient was prepped using Betadine solution.  Lidocaine jelly was administered per urethral meatus.    Preoperative abx where received prior to procedure.     Pre-Procedure: - Inspection reveals a normal caliber ureteral meatus.  Procedure: The flexible cystoscope was introduced without difficulty - No urethral strictures/lesions are present. - prostate mild enlargment, partially occlusive - bladder neck normal  - Bilateral ureteral orifices identified -- right UO reimplanted right superior. I could get into it for a couple of cm's and  it appeared normal.  - Bladder mucosa  reveals no ulcers, tumors, or lesions - No bladder stones - No trabeculation  Retroflexion shows normal bladder neck     Post-Procedure: - Patient tolerated the procedure well  Assessment/ Plan:  LGTa in right distal ureter in 2015, LGTa in 2016 and PUNLMP in 2018 -- cysto really looks good today. No worrisome findings. Discussed cysto in 6 mo vs 1 year. Will go 6 mo and then could probably go yearly. Discussed CT and we will hold off for now.

## 2017-08-26 LAB — URINALYSIS, COMPLETE
Bilirubin, UA: NEGATIVE
Ketones, UA: NEGATIVE
Leukocytes, UA: NEGATIVE
NITRITE UA: NEGATIVE
PH UA: 5.5 (ref 5.0–7.5)
Protein, UA: NEGATIVE
RBC, UA: NEGATIVE
Specific Gravity, UA: 1.02 (ref 1.005–1.030)
UUROB: 0.2 mg/dL (ref 0.2–1.0)

## 2017-08-26 LAB — MICROSCOPIC EXAMINATION

## 2017-08-31 DIAGNOSIS — E119 Type 2 diabetes mellitus without complications: Secondary | ICD-10-CM | POA: Diagnosis not present

## 2017-08-31 LAB — HM DIABETES EYE EXAM

## 2017-09-14 ENCOUNTER — Other Ambulatory Visit: Payer: Self-pay | Admitting: Unknown Physician Specialty

## 2017-09-14 NOTE — Telephone Encounter (Signed)
Copied from [place] [id number]. Topic: Quick Communication - Rx Refill/Question >> Sep 14, 2017  4:18 PM Bea Graff, NT wrote: Medication: SitaGLIPtin-MetFORMIN HCl (JANUMET XR) 50-1000 MG TB24  Has the patient contacted their pharmacy? Yes.   (Agent: If no, request that the patient contact the pharmacy for the refill.) (Agent: If yes, when and what did the pharmacy advise?)  Preferred Pharmacy (with phone number or street name): [facility] ([facility] [place] [phone] (Phone) [phone] (Fax)      Agent: Please be advised that RX refills may take up to 3 business days. We ask that you follow-up with your pharmacy.

## 2017-09-15 ENCOUNTER — Other Ambulatory Visit: Payer: Self-pay | Admitting: *Deleted

## 2017-09-15 NOTE — Telephone Encounter (Signed)
Rx request: Janumet XR 50-1000 mg    Sig needs to be corrected and medication count needs to be corrected  LOV: 06/07/17  PCP: [place]: verified

## 2017-09-16 MED ORDER — SITAGLIP PHOS-METFORMIN HCL ER 50-1000 MG PO TB24: 1.0000 | ORAL_TABLET | Freq: Two times a day (BID) | ORAL | 3 refills | Status: DC

## 2017-09-16 NOTE — Telephone Encounter (Signed)
Pt needs appt

## 2017-09-21 DIAGNOSIS — R079 Chest pain, unspecified: Secondary | ICD-10-CM | POA: Diagnosis not present

## 2017-09-21 DIAGNOSIS — E119 Type 2 diabetes mellitus without complications: Secondary | ICD-10-CM | POA: Diagnosis not present

## 2017-09-21 DIAGNOSIS — I10 Essential (primary) hypertension: Secondary | ICD-10-CM | POA: Diagnosis not present

## 2017-09-21 DIAGNOSIS — I350 Nonrheumatic aortic (valve) stenosis: Secondary | ICD-10-CM | POA: Diagnosis not present

## 2017-09-21 DIAGNOSIS — Z951 Presence of aortocoronary bypass graft: Secondary | ICD-10-CM | POA: Diagnosis not present

## 2017-09-21 DIAGNOSIS — E785 Hyperlipidemia, unspecified: Secondary | ICD-10-CM | POA: Diagnosis not present

## 2017-09-21 DIAGNOSIS — Z952 Presence of prosthetic heart valve: Secondary | ICD-10-CM | POA: Diagnosis not present

## 2017-10-13 ENCOUNTER — Other Ambulatory Visit: Payer: Self-pay | Admitting: Surgery

## 2017-10-13 DIAGNOSIS — R600 Localized edema: Secondary | ICD-10-CM

## 2017-10-13 DIAGNOSIS — C44222 Squamous cell carcinoma of skin of right ear and external auricular canal: Secondary | ICD-10-CM | POA: Diagnosis not present

## 2017-10-13 DIAGNOSIS — L570 Actinic keratosis: Secondary | ICD-10-CM | POA: Diagnosis not present

## 2017-10-13 DIAGNOSIS — D485 Neoplasm of uncertain behavior of skin: Secondary | ICD-10-CM | POA: Diagnosis not present

## 2017-10-13 DIAGNOSIS — C44519 Basal cell carcinoma of skin of other part of trunk: Secondary | ICD-10-CM | POA: Diagnosis not present

## 2017-10-14 ENCOUNTER — Other Ambulatory Visit: Payer: Self-pay | Admitting: Surgery

## 2017-10-14 DIAGNOSIS — R600 Localized edema: Secondary | ICD-10-CM

## 2017-11-03 ENCOUNTER — Ambulatory Visit (INDEPENDENT_AMBULATORY_CARE_PROVIDER_SITE_OTHER): Payer: PPO

## 2017-11-03 VITALS — BP 126/64 | HR 60 | Temp 98.1°F | Resp 16 | Ht 71.0 in | Wt 203.8 lb

## 2017-11-03 DIAGNOSIS — Z0000 Encounter for general adult medical examination without abnormal findings: Secondary | ICD-10-CM

## 2017-11-03 DIAGNOSIS — Z23 Encounter for immunization: Secondary | ICD-10-CM

## 2017-11-03 NOTE — Patient Instructions (Addendum)
John [name] , Thank you for taking time to come for your Medicare Wellness Visit. I appreciate your ongoing commitment to your health goals. Please review the following plan we discussed and let me know if I can assist you in the future.   Screening recommendations/referrals: Colonoscopy: no longer required  Recommended yearly ophthalmology/optometry visit for glaucoma screening and checkup Recommended yearly dental visit for hygiene and checkup  Vaccinations: Influenza vaccine: up to date  Pneumococcal vaccine: completed series Tdap vaccine: due, check with your insurance company for coverage  Shingles vaccine: shingrix  Eligible, check with your insurance company for coverage   Advanced directives: Please bring a copy of your health care power of attorney and living will to the office at your convenience.  Conditions/risks identified: Recommend drinking at least 6-8 glasses of water a day   Next appointment: Follow up in one year for your annual wellness exam.   Preventive Care 65 Years and Older, Male Preventive care refers to lifestyle choices and visits with your health care provider that can promote health and wellness. What does preventive care include?  A yearly physical exam. This is also called an annual well check.  Dental exams once or twice a year.  Routine eye exams. Ask your health care provider how often you should have your eyes checked.  Personal lifestyle choices, including:  Daily care of your teeth and gums.  Regular physical activity.  Eating a healthy diet.  Avoiding tobacco and drug use.  Limiting alcohol use.  Practicing safe sex.  Taking low doses of aspirin every day.  Taking vitamin and mineral supplements as recommended by your health care provider. What happens during an annual well check? The services and screenings done by your health care provider during your annual well check will depend on your age, overall health, lifestyle risk  factors, and family history of disease. Counseling  Your health care provider may ask you questions about your:  Alcohol use.  Tobacco use.  Drug use.  Emotional well-being.  Home and relationship well-being.  Sexual activity.  Eating habits.  History of falls.  Memory and ability to understand (cognition).  Work and work Statistician. Screening  You may have the following tests or measurements:  Height, weight, and BMI.  Blood pressure.  Lipid and cholesterol levels. These may be checked every 5 years, or more frequently if you are over 6 years old.  Skin check.  Lung cancer screening. You may have this screening every year starting at age 28 if you have a 30-pack-year history of smoking and currently smoke or have quit within the past 15 years.  Fecal occult blood test (FOBT) of the stool. You may have this test every year starting at age 49.  Flexible sigmoidoscopy or colonoscopy. You may have a sigmoidoscopy every 5 years or a colonoscopy every 10 years starting at age 29.  Prostate cancer screening. Recommendations will vary depending on your family history and other risks.  Hepatitis C blood test.  Hepatitis B blood test.  Sexually transmitted disease (STD) testing.  Diabetes screening. This is done by checking your blood sugar (glucose) after you have not eaten for a while (fasting). You may have this done every 1-3 years.  Abdominal aortic aneurysm (AAA) screening. You may need this if you are a current or former smoker.  Osteoporosis. You may be screened starting at age 75 if you are at high risk. Talk with your health care provider about your test results, treatment options, and if  necessary, the need for more tests. Vaccines  Your health care provider may recommend certain vaccines, such as:  Influenza vaccine. This is recommended every year.  Tetanus, diphtheria, and acellular pertussis (Tdap, Td) vaccine. You may need a Td booster every 10  years.  Zoster vaccine. You may need this after age 20.  Pneumococcal 13-valent conjugate (PCV13) vaccine. One dose is recommended after age 62.  Pneumococcal polysaccharide (PPSV23) vaccine. One dose is recommended after age 70. Talk to your health care provider about which screenings and vaccines you need and how often you need them. This information is not intended to replace advice given to you by your health care provider. Make sure you discuss any questions you have with your health care provider. Document Released: 03/14/2015 Document Revised: 11/05/2015 Document Reviewed: 12/17/2014 Elsevier Interactive Patient Education  2017 [place] Prevention in the Home Falls can cause injuries. They can happen to people of all ages. There are many things you can do to make your home safe and to help prevent falls. What can I do on the outside of my home?  Regularly fix the edges of walkways and driveways and fix any cracks.  Remove anything that might make you trip as you walk through a door, such as a raised step or threshold.  Trim any bushes or trees on the path to your home.  Use bright outdoor lighting.  Clear any walking paths of anything that might make someone trip, such as rocks or tools.  Regularly check to see if handrails are loose or broken. Make sure that both sides of any steps have handrails.  Any raised decks and porches should have guardrails on the edges.  Have any leaves, snow, or ice cleared regularly.  Use sand or salt on walking paths during winter.  Clean up any spills in your garage right away. This includes oil or grease spills. What can I do in the bathroom?  Use night lights.  Install grab bars by the toilet and in the tub and shower. Do not use towel bars as grab bars.  Use non-skid mats or decals in the tub or shower.  If you need to sit down in the shower, use a plastic, non-slip stool.  Keep the floor dry. Clean up any water that  spills on the floor as soon as it happens.  Remove soap buildup in the tub or shower regularly.  Attach bath mats securely with double-sided non-slip rug tape.  Do not have throw rugs and other things on the floor that can make you trip. What can I do in the bedroom?  Use night lights.  Make sure that you have a light by your bed that is easy to reach.  Do not use any sheets or blankets that are too big for your bed. They should not hang down onto the floor.  Have a firm chair that has side arms. You can use this for support while you get dressed.  Do not have throw rugs and other things on the floor that can make you trip. What can I do in the kitchen?  Clean up any spills right away.  Avoid walking on wet floors.  Keep items that you use a lot in easy-to-reach places.  If you need to reach something above you, use a strong step stool that has a grab bar.  Keep electrical cords out of the way.  Do not use floor polish or wax that makes floors slippery. If you must  use wax, use non-skid floor wax.  Do not have throw rugs and other things on the floor that can make you trip. What can I do with my stairs?  Do not leave any items on the stairs.  Make sure that there are handrails on both sides of the stairs and use them. Fix handrails that are broken or loose. Make sure that handrails are as long as the stairways.  Check any carpeting to make sure that it is firmly attached to the stairs. Fix any carpet that is loose or worn.  Avoid having throw rugs at the top or bottom of the stairs. If you do have throw rugs, attach them to the floor with carpet tape.  Make sure that you have a light switch at the top of the stairs and the bottom of the stairs. If you do not have them, ask someone to add them for you. What else can I do to help prevent falls?  Wear shoes that:  Do not have high heels.  Have rubber bottoms.  Are comfortable and fit you well.  Are closed at the  toe. Do not wear sandals.  If you use a stepladder:  Make sure that it is fully opened. Do not climb a closed stepladder.  Make sure that both sides of the stepladder are locked into place.  Ask someone to hold it for you, if possible.  Clearly mark and make sure that you can see:  Any grab bars or handrails.  First and last steps.  Where the edge of each step is.  Use tools that help you move around (mobility aids) if they are needed. These include:  Canes.  Walkers.  Scooters.  Crutches.  Turn on the lights when you go into a dark area. Replace any light bulbs as soon as they burn out.  Set up your furniture so you have a clear path. Avoid moving your furniture around.  If any of your floors are uneven, fix them.  If there are any pets around you, be aware of where they are.  Review your medicines with your doctor. Some medicines can make you feel dizzy. This can increase your chance of falling. Ask your doctor what other things that you can do to help prevent falls. This information is not intended to replace advice given to you by your health care provider. Make sure you discuss any questions you have with your health care provider. Document Released: 12/12/2008 Document Revised: 07/24/2015 Document Reviewed: 03/22/2014 Elsevier Interactive Patient Education  2017 [place].  Influenza (Flu) Vaccine (Inactivated or Recombinant): What You Need to Know 1. Why get vaccinated? Influenza ("flu") is a contagious disease that spreads around the [place] every year, usually between October and May. Flu is caused by influenza viruses, and is spread mainly by coughing, sneezing, and close contact. Anyone can get flu. Flu strikes suddenly and can last several days. Symptoms vary by age, but can include:  fever/chills  sore throat  muscle aches  fatigue  cough  headache  runny or stuffy nose  Flu can also lead to pneumonia and blood infections, and cause  diarrhea and seizures in children. If you have a medical condition, such as heart or lung disease, flu can make it worse. Flu is more dangerous for some people. Infants and young children, people 25 years of age and older, pregnant women, and people with certain health conditions or a weakened immune system are at greatest risk. Each year thousands of people  in the [place] die from flu, and many more are hospitalized. Flu vaccine can:  keep you from getting flu,  make flu less severe if you do get it, and  keep you from spreading flu to your family and other people. 2. Inactivated and recombinant flu vaccines A dose of flu vaccine is recommended every flu season. Children 6 months through 65 years of age may need two doses during the same flu season. Everyone else needs only one dose each flu season. Some inactivated flu vaccines contain a very small amount of a mercury-based preservative called thimerosal. Studies have not shown thimerosal in vaccines to be harmful, but flu vaccines that do not contain thimerosal are available. There is no live flu virus in flu shots. They cannot cause the flu. There are many flu viruses, and they are always changing. Each year a new flu vaccine is made to protect against three or four viruses that are likely to cause disease in the upcoming flu season. But even when the vaccine doesn't exactly match these viruses, it may still provide some protection. Flu vaccine cannot prevent:  flu that is caused by a virus not covered by the vaccine, or  illnesses that look like flu but are not.  It takes about 2 weeks for protection to develop after vaccination, and protection lasts through the flu season. 3. Some people should not get this vaccine Tell the person who is giving you the vaccine:  If you have any severe, life-threatening allergies. If you ever had a life-threatening allergic reaction after a dose of flu vaccine, or have a severe allergy to any part  of this vaccine, you may be advised not to get vaccinated. Most, but not all, types of flu vaccine contain a small amount of egg protein.  If you ever had Guillain-Barr Syndrome (also called GBS). Some people with a history of GBS should not get this vaccine. This should be discussed with your doctor.  If you are not feeling well. It is usually okay to get flu vaccine when you have a mild illness, but you might be asked to come back when you feel better.  4. Risks of a vaccine reaction With any medicine, including vaccines, there is a chance of reactions. These are usually mild and go away on their own, but serious reactions are also possible. Most people who get a flu shot do not have any problems with it. Minor problems following a flu shot include:  soreness, redness, or swelling where the shot was given  hoarseness  sore, red or itchy eyes  cough  fever  aches  headache  itching  fatigue  If these problems occur, they usually begin soon after the shot and last 1 or 2 days. More serious problems following a flu shot can include the following:  There may be a small increased risk of Guillain-Barre Syndrome (GBS) after inactivated flu vaccine. This risk has been estimated at 1 or 2 additional cases per million people vaccinated. This is much lower than the risk of severe complications from flu, which can be prevented by flu vaccine.  Young children who get the flu shot along with pneumococcal vaccine (PCV13) and/or DTaP vaccine at the same time might be slightly more likely to have a seizure caused by fever. Ask your doctor for more information. Tell your doctor if a child who is getting flu vaccine has ever had a seizure.  Problems that could happen after any injected vaccine:  People sometimes faint  after a medical procedure, including vaccination. Sitting or lying down for about 15 minutes can help prevent fainting, and injuries caused by a fall. Tell your doctor if you  feel dizzy, or have vision changes or ringing in the ears.  Some people get severe pain in the shoulder and have difficulty moving the arm where a shot was given. This happens very rarely.  Any medication can cause a severe allergic reaction. Such reactions from a vaccine are very rare, estimated at about 1 in a million doses, and would happen within a few minutes to a few hours after the vaccination. As with any medicine, there is a very remote chance of a vaccine causing a serious injury or death. The safety of vaccines is always being monitored. For more information, visit: http://www.aguilar.org/ 5. What if there is a serious reaction? What should I look for? Look for anything that concerns you, such as signs of a severe allergic reaction, very high fever, or unusual behavior. Signs of a severe allergic reaction can include hives, swelling of the face and throat, difficulty breathing, a fast heartbeat, dizziness, and weakness. These would start a few minutes to a few hours after the vaccination. What should I do?  If you think it is a severe allergic reaction or other emergency that can't wait, call 9-1-1 and get the person to the nearest hospital. Otherwise, call your doctor.  Reactions should be reported to the Vaccine Adverse Event Reporting System (VAERS). Your doctor should file this report, or you can do it yourself through the VAERS web site at www.vaers.SamedayNews.es, or by calling 8201899477. ? VAERS does not give medical advice. 6. The National Vaccine Injury Compensation Program The [employer] Vaccine Injury Compensation Program (VICP) is a federal program that was created to compensate people who may have been injured by certain vaccines. Persons who believe they may have been injured by a vaccine can learn about the program and about filing a claim by calling (747)240-6363 or visiting the [employer] website at GoldCloset.com.ee. There is a time limit to file a claim for  compensation. 7. How can I learn more?  Ask your healthcare provider. He or she can give you the vaccine package insert or suggest other sources of information.  Call your local or state health department.  Contact the Centers for Disease Control and Prevention (CDC): ? Call (475)073-5244 (1-800-CDC-INFO) or ? Visit CDC's website at https://gibson.com/ Vaccine Information Statement, Inactivated Influenza Vaccine (10/05/2013) This information is not intended to replace advice given to you by your health care provider. Make sure you discuss any questions you have with your health care provider. Document Released: 12/10/2005 Document Revised: 11/06/2015 Document Reviewed: 11/06/2015 Elsevier Interactive Patient Education  2017 [employer].

## 2017-11-03 NOTE — Progress Notes (Signed)
Subjective:   John [name] is a 77 y.o. male who presents for Medicare Annual/Subsequent preventive examination.  Review of Systems:   Cardiac Risk Factors include: male gender;hypertension;advanced age (>28men, >37 women);dyslipidemia;smoking/ tobacco exposure     Objective:    Vitals: BP 126/64 (BP Location: Left Arm, Patient Position: Sitting)   Pulse 60   Temp 98.1 F (36.7 C) (Temporal)   Resp 16   Ht 5\' 11"  (1.803 m)   Wt 203 lb 12.8 oz (92.4 kg)   BMI 28.42 kg/m   Body mass index is 28.42 kg/m.  Advanced Directives 11/03/2017 06/13/2017 05/07/2017 05/03/2017 11/03/2016 01/28/2016 01/05/2016  Does Patient Have a Medical Advance Directive? Yes No No Yes Yes Yes No  Type of Paramedic of [place];Living will - - - [place];Living will - -  Does patient want to make changes to medical advance directive? - - No - Patient declined - - - -  Copy of [place] in Chart? No - copy requested - - - No - copy requested - -  Would patient like information on creating a medical advance directive? - No - Patient declined No - Patient declined - - - No - patient declined information    Tobacco Social History   Tobacco Use  Smoking Status Former Smoker  . Years: 25.00  . Last attempt to quit: 12/15/1980  . Years since quitting: 36.9  Smokeless Tobacco Never Used     Counseling given: Not Answered   Clinical Intake:  Pre-visit preparation completed: Yes  Pain : No/denies pain     Nutritional Status: BMI 25 -29 Overweight Nutritional Risks: None Diabetes: No  How often do you need to have someone help you when you read instructions, pamphlets, or other written materials from your doctor or pharmacy?: 1 - Never What is the last grade level you completed in school?: 10th grade  Interpreter Needed?: No  Information entered by :: Tiffany Hill,LPN   Past Medical History:  Diagnosis Date  . Anemia   . Anginal  pain ([place])   . Aortic stenosis   . Cancer ([place])    bladder  . Cataracts, bilateral   . CHF (congestive heart failure) ([place])   . Chronic kidney disease   . Coronary artery disease   . Diabetes mellitus without complication ([place])    takes Janumet daily...dx approx. 2009  . Dyspnea   . Dysrhythmia   . Heart murmur   . History of colon polyps    benign  . Hyperlipidemia    takes Lovastatin daily  . Hypertension    takes Hyzaar and Amlodipine daily  . Joint pain   . Medical history non-contributory   . Muscle cramps    occasionally in legs   . Osteoarthritis   . Pre-diabetes   . Skin cancer of face   . Urinary urgency    Past Surgical History:  Procedure Laterality Date  . AORTIC VALVE REPLACEMENT N/A 05/05/2017   Procedure: AORTIC VALVE REPLACEMENT (AVR) USING MAGNA EASE PERICARDIAL BIOPROSTHESIS - AORTIC VALVE MODEL 3300TFX, SIZE 23 MM;  Surgeon: Gaye Pollack, MD;  Location: [facility];  Service: Open Heart Surgery;  Laterality: N/A;  . BLADDER SURGERY    . CARDIAC VALVE REPLACEMENT    . COLONOSCOPY    . CORONARY ARTERY BYPASS GRAFT N/A 05/05/2017   Procedure: CORONARY ARTERY BYPASS GRAFTING (CABG) x 4 WITH ENDOSCOPIC HARVESTING OF RIGHT SAPHENOUS VEIN;  Surgeon: Gaye Pollack,  MD;  Location: [facility];  Service: Open Heart Surgery;  Laterality: N/A;  . CYSTOURETHROSCOPY    . JOINT REPLACEMENT Left    hip  . KNEE ARTHROPLASTY Right 01/12/2016   Procedure: RIGHT  TOTAL KNEE ARTHROPLASTY WITH COMPUTER NAVIGATION;  Surgeon: Rod Can, MD;  Location: [facility];  Service: Orthopedics;  Laterality: Right;  Needs RNFA  . NO PAST SURGERIES    . RIGHT/LEFT HEART CATH AND CORONARY ANGIOGRAPHY N/A 04/21/2017   Procedure: RIGHT/LEFT HEART CATH AND CORONARY ANGIOGRAPHY;  Surgeon: Isaias Cowman, MD;  Location: [facility];  Service: Cardiovascular;  Laterality: N/A;  . SKIN SURGERY Left    arm  . TEE WITHOUT CARDIOVERSION N/A 05/05/2017   Procedure: TRANSESOPHAGEAL ECHOCARDIOGRAM  (TEE);  Surgeon: Gaye Pollack, MD;  Location: [facility];  Service: Open Heart Surgery;  Laterality: N/A;   Family History  Problem Relation Age of Onset  . Dementia Mother   . Stroke Mother   . Stroke Father   . Diabetes Father   . Heart disease Father   . Cancer Sister   . Hypertension Brother   . Cancer Sister    Social History   Socioeconomic History  . Marital status: Married    Spouse name: Not on file  . Number of children: Not on file  . Years of education: Not on file  . Highest education level: 10th grade  Occupational History  . Not on file  Social Needs  . Financial resource strain: Not hard at all  . Food insecurity:    Worry: Never true    Inability: Never true  . Transportation needs:    Medical: No    Non-medical: No  Tobacco Use  . Smoking status: Former Smoker    Years: 25.00    Last attempt to quit: 12/15/1980    Years since quitting: 36.9  . Smokeless tobacco: Never Used  Substance and Sexual Activity  . Alcohol use: No    Alcohol/week: 0.0 standard drinks  . Drug use: No  . Sexual activity: Yes  Lifestyle  . Physical activity:    Days per week: 3 days    Minutes per session: 60 min  . Stress: Not at all  Relationships  . Social connections:    Talks on phone: Not on file    Gets together: Not on file    Attends religious service: More than 4 times per year    Active member of club or organization: No    Attends meetings of clubs or organizations: Never    Relationship status: Married  Other Topics Concern  . Not on file  Social History Narrative  . Not on file    Outpatient Encounter Medications as of 11/03/2017  Medication Sig  . amLODipine (NORVASC) 5 MG tablet Take 5 mg by mouth daily.  . Ascorbic Acid (VITAMIN C) 1000 MG tablet Take 1,000 mg by mouth daily.  Marland Kitchen aspirin 81 MG EC tablet Take 1 tablet (81 mg total) by mouth daily.  Marland Kitchen atorvastatin (LIPITOR) 40 MG tablet Take 1 tablet (40 mg total) by mouth daily at 6 PM.  .  cholecalciferol (VITAMIN D) 1000 units tablet Take 2,000 Units by mouth daily.  . furosemide (LASIX) 40 MG tablet Take 1 tablet (40 mg total) by mouth daily.  Marland Kitchen KLOR-CON M10 10 MEQ tablet Take 1 tablet (10 mEq total) by mouth daily.  Marland Kitchen losartan (COZAAR) 50 MG tablet Take 50 mg by mouth daily.  . metoprolol tartrate (LOPRESSOR)  25 MG tablet Take 1 tablet (25 mg total) by mouth 2 (two) times daily.  . SitaGLIPtin-MetFORMIN HCl (JANUMET XR) 50-1000 MG TB24 Take 1 tablet by mouth 2 (two) times daily.  . Cyanocobalamin (RA VITAMIN B-12 TR) 1000 MCG TBCR Take 1,000 mcg by mouth daily.    No facility-administered encounter medications on file as of 11/03/2017.     Activities of Daily Living In your present state of health, do you have any difficulty performing the following activities: 11/03/2017 05/07/2017  Hearing? [place]? Y -  Comment having cataract surgery soon  -  Difficulty concentrating or making decisions? N -  Walking or climbing stairs? N -  Comment - -  Dressing or bathing? N -  Doing errands, shopping? N N  Preparing Food and eating ? N -  Using the Toilet? N -  In the past six months, have you accidently leaked urine? N -  Do you have problems with loss of bowel control? N -  Managing your Medications? N -  Managing your Finances? N -  Housekeeping or managing your Housekeeping? N -  Some recent data might be hidden    Patient Care Team: Kathrine Haddock, NP as PCP - General (Nurse Practitioner) Kathrine Haddock, NP as PCP - Family Medicine (Nurse Practitioner) Rod Can, MD as Consulting Physician (Orthopedic Surgery) Isaias Cowman, MD as Consulting Physician (Cardiology) Gaye Pollack, MD as Consulting Physician (Cardiothoracic Surgery) Jannet Mantis, MD (Dermatology)   Assessment:   This is a routine wellness examination for [place].  Exercise Activities and Dietary recommendations Current Exercise Habits: Structured exercise class,  Type of exercise: strength training/weights(stationary bike), Time (Minutes): 60, Frequency (Times/Week): 3, Weekly Exercise (Minutes/Week): 180, Intensity: Mild, Exercise limited by: None identified  Goals    . DIET - INCREASE WATER INTAKE     Recommend drinking at least 6-8 glasses of water a day        Fall Risk Fall Risk  11/03/2017 06/13/2017 11/03/2016 11/25/2015 05/19/2015  Falls in the past year? No No No No No   Is the patient's home free of loose throw rugs in walkways, pet beds, electrical cords, etc?   yes      Grab bars in the bathroom? no      Handrails on the stairs?   yes      Adequate lighting?   yes  Timed Get Up and Go Performed: Completed in 8 seconds with no use of assistive devices, steady gait. No intervention needed at this time.   Depression Screen PHQ 2/9 Scores 11/03/2017 06/13/2017 11/03/2016 11/25/2015  PHQ - 2 Score 0 2 0 2  PHQ- 9 Score - 8 - 4    Cognitive Function     6CIT Screen 11/03/2017 11/03/2016  What Year? 0 points 0 points  What month? 0 points 0 points  What time? 0 points 0 points  Count back from 20 0 points 0 points  Months in reverse 0 points 0 points  Repeat phrase 0 points 2 points  Total Score 0 2    Immunization History  Administered Date(s) Administered  . Influenza, High Dose Seasonal PF 11/25/2015, 11/03/2016, 11/03/2017  . Influenza,inj,Quad PF,6+ Mos 11/18/2014  . Pneumococcal Conjugate-13 11/23/2013  . Pneumococcal Polysaccharide-23 05/24/2014  . Zoster 05/19/1957    Qualifies for Shingles Vaccine? Yes, discussed shingrix vaccine   Screening Tests Health Maintenance  Topic Date Due  . FOOT EXAM  05/24/2017  . INFLUENZA VACCINE  09/29/2017  . TETANUS/TDAP  06/08/2018 (Originally 03/25/1959)  . COLONOSCOPY  10/31/2018 (Originally 03/01/2010)  . HEMOGLOBIN A1C  12/07/2017  . OPHTHALMOLOGY EXAM  09/01/2018  . PNA vac Low Risk Adult  Completed   Cancer Screenings: Lung: Low Dose CT Chest recommended if Age 46-80 years, 30  pack-year currently smoking OR have quit w/in 15years. Patient does not qualify. Colorectal: no longer required  Additional Screenings: Hepatitis C Screening: not indicated       Plan:    I have personally reviewed and addressed the Medicare Annual Wellness questionnaire and have noted the following in the patient's chart:  A. Medical and social history B. Use of alcohol, tobacco or illicit drugs  C. Current medications and supplements D. Functional ability and status E.  Nutritional status F.  Physical activity G. Advance directives H. List of other physicians I.  Hospitalizations, surgeries, and ER visits in previous 12 months J.  [place] such as hearing and vision if needed, cognitive and depression L. Referrals and appointments   In addition, I have reviewed and discussed with patient certain preventive protocols, quality metrics, and best practice recommendations. A written personalized care plan for preventive services as well as general preventive health recommendations were provided to patient.   Signed,  Tyler Aas, LPN Nurse Health Advisor   Nurse Notes: due for diabetic foot exam- will make cpe with Jolene Cannady,NP in October.

## 2017-11-21 DIAGNOSIS — C44222 Squamous cell carcinoma of skin of right ear and external auricular canal: Secondary | ICD-10-CM | POA: Diagnosis not present

## 2017-11-21 DIAGNOSIS — C44221 Squamous cell carcinoma of skin of unspecified ear and external auricular canal: Secondary | ICD-10-CM | POA: Diagnosis not present

## 2017-12-05 DIAGNOSIS — C44519 Basal cell carcinoma of skin of other part of trunk: Secondary | ICD-10-CM | POA: Diagnosis not present

## 2017-12-05 DIAGNOSIS — L905 Scar conditions and fibrosis of skin: Secondary | ICD-10-CM | POA: Diagnosis not present

## 2017-12-05 DIAGNOSIS — C4491 Basal cell carcinoma of skin, unspecified: Secondary | ICD-10-CM | POA: Diagnosis not present

## 2017-12-08 ENCOUNTER — Encounter: Payer: Self-pay | Admitting: Nurse Practitioner

## 2017-12-08 ENCOUNTER — Ambulatory Visit (INDEPENDENT_AMBULATORY_CARE_PROVIDER_SITE_OTHER): Payer: PPO | Admitting: Nurse Practitioner

## 2017-12-08 DIAGNOSIS — D519 Vitamin B12 deficiency anemia, unspecified: Secondary | ICD-10-CM

## 2017-12-08 DIAGNOSIS — N183 Chronic kidney disease, stage 3 unspecified: Secondary | ICD-10-CM | POA: Insufficient documentation

## 2017-12-08 DIAGNOSIS — I10 Essential (primary) hypertension: Secondary | ICD-10-CM | POA: Diagnosis not present

## 2017-12-08 DIAGNOSIS — E1122 Type 2 diabetes mellitus with diabetic chronic kidney disease: Secondary | ICD-10-CM

## 2017-12-08 LAB — BAYER DCA HB A1C WAIVED: HB A1C (BAYER DCA - WAIVED): 6.1 % (ref <7.0)

## 2017-12-08 NOTE — Patient Instructions (Signed)
Anemia Anemia is a condition in which you do not have enough red blood cells or hemoglobin. Hemoglobin is a substance in red blood cells that carries oxygen. When you do not have enough red blood cells or hemoglobin (are anemic), your body cannot get enough oxygen and your organs may not work properly. As a result, you may feel very tired or have other problems. What are the causes? Common causes of anemia include:  Excessive bleeding. Anemia can be caused by excessive bleeding inside or outside the body, including bleeding from the intestine or from periods in women.  Poor nutrition.  Long-lasting (chronic) kidney, thyroid, and liver disease.  Bone marrow disorders.  Cancer and treatments for cancer.  HIV (human immunodeficiency virus) and AIDS (acquired immunodeficiency syndrome).  Treatments for HIV and AIDS.  Spleen problems.  Blood disorders.  Infections, medicines, and autoimmune disorders that destroy red blood cells.  What are the signs or symptoms? Symptoms of this condition include:  Minor weakness.  Dizziness.  Headache.  Feeling heartbeats that are irregular or faster than normal (palpitations).  Shortness of breath, especially with exercise.  Paleness.  Cold sensitivity.  Indigestion.  Nausea.  Difficulty sleeping.  Difficulty concentrating.  Symptoms may occur suddenly or develop slowly. If your anemia is mild, you may not have symptoms. How is this diagnosed? This condition is diagnosed based on:  Blood tests.  Your medical history.  A physical exam.  Bone marrow biopsy.  Your health care provider may also check your stool (feces) for blood and may do additional testing to look for the cause of your bleeding. You may also have other tests, including:  Imaging tests, such as a CT scan or MRI.  Endoscopy.  Colonoscopy.  How is this treated? Treatment for this condition depends on the cause. If you continue to lose a lot of blood,  you may need to be treated at a hospital. Treatment may include:  Taking supplements of iron, vitamin B12, or folic acid.  Taking a hormone medicine (erythropoietin) that can help to stimulate red blood cell growth.  Having a blood transfusion. This may be needed if you lose a lot of blood.  Making changes to your diet.  Having surgery to remove your spleen.  Follow these instructions at home:  Take over-the-counter and prescription medicines only as told by your health care provider.  Take supplements only as told by your health care provider.  Follow any diet instructions that you were given.  Keep all follow-up visits as told by your health care provider. This is important. Contact a health care provider if:  You develop new bleeding anywhere in the body. Get help right away if:  You are very weak.  You are short of breath.  You have pain in your abdomen or chest.  You are dizzy or feel faint.  You have trouble concentrating.  You have bloody or black, tarry stools.  You vomit repeatedly or you vomit up blood. Summary  Anemia is a condition in which you do not have enough red blood cells or enough of a substance in your red blood cells that carries oxygen (hemoglobin).  Symptoms may occur suddenly or develop slowly.  If your anemia is mild, you may not have symptoms.  This condition is diagnosed with blood tests as well as a medical history and physical exam. Other tests may be needed.  Treatment for this condition depends on the cause of the anemia. This information is not intended to replace advice   given to you by your health care provider. Make sure you discuss any questions you have with your health care provider. °Document Released: 03/25/2004 Document Revised: 03/19/2016 Document Reviewed: 03/19/2016 °Elsevier Interactive Patient Education © 2018 Elsevier Inc. ° ° ° ° °Diabetes Mellitus and Nutrition °When you have diabetes (diabetes mellitus), it is very  important to have healthy eating habits because your blood sugar (glucose) levels are greatly affected by what you eat and drink. Eating healthy foods in the appropriate amounts, at about the same times every day, can help you: °· Control your blood glucose. °· Lower your risk of heart disease. °· Improve your blood pressure. °· Reach or maintain a healthy weight. ° °Every person with diabetes is different, and each person has different needs for a meal plan. Your health care provider may recommend that you work with a diet and nutrition specialist (dietitian) to make a meal plan that is best for you. Your meal plan may vary depending on factors such as: °· The calories you need. °· The medicines you take. °· Your weight. °· Your blood glucose, blood pressure, and cholesterol levels. °· Your activity level. °· Other health conditions you have, such as heart or kidney disease. ° °How do carbohydrates affect me? °Carbohydrates affect your blood glucose level more than any other type of food. Eating carbohydrates naturally increases the amount of glucose in your blood. Carbohydrate counting is a method for keeping track of how many carbohydrates you eat. Counting carbohydrates is important to keep your blood glucose at a healthy level, especially if you use insulin or take certain oral diabetes medicines. °It is important to know how many carbohydrates you can safely have in each meal. This is different for every person. Your dietitian can help you calculate how many carbohydrates you should have at each meal and for snack. °Foods that contain carbohydrates include: °· Bread, cereal, rice, pasta, and crackers. °· Potatoes and corn. °· Peas, beans, and lentils. °· Milk and yogurt. °· Fruit and juice. °· Desserts, such as cakes, cookies, ice cream, and candy. ° °How does alcohol affect me? °Alcohol can cause a sudden decrease in blood glucose (hypoglycemia), especially if you use insulin or take certain oral diabetes  medicines. Hypoglycemia can be a life-threatening condition. Symptoms of hypoglycemia (sleepiness, dizziness, and confusion) are similar to symptoms of having too much alcohol. °If your health care provider says that alcohol is safe for you, follow these guidelines: °· Limit alcohol intake to no more than 1 drink per day for nonpregnant women and 2 drinks per day for men. One drink equals 12 oz of beer, 5 oz of wine, or 1½ oz of hard liquor. °· Do not drink on an empty stomach. °· Keep yourself hydrated with water, diet soda, or unsweetened iced tea. °· Keep in mind that regular soda, juice, and other mixers may contain a lot of sugar and must be counted as carbohydrates. ° °What are tips for following this plan? °Reading food labels °· Start by checking the serving size on the label. The amount of calories, carbohydrates, fats, and other nutrients listed on the label are based on one serving of the food. Many foods contain more than one serving per package. °· Check the total grams (g) of carbohydrates in one serving. You can calculate the number of servings of carbohydrates in one serving by dividing the total carbohydrates by 15. For example, if a food has 30 g of total carbohydrates, it would be equal to 2   servings of carbohydrates. °· Check the number of grams (g) of saturated and trans fats in one serving. Choose foods that have low or no amount of these fats. °· Check the number of milligrams (mg) of sodium in one serving. Most people should limit total sodium intake to less than 2,300 mg per day. °· Always check the nutrition information of foods labeled as "low-fat" or "nonfat". These foods may be higher in added sugar or refined carbohydrates and should be avoided. °· Talk to your dietitian to identify your daily goals for nutrients listed on the label. °Shopping °· Avoid buying canned, premade, or processed foods. These foods tend to be high in fat, sodium, and added sugar. °· Shop around the outside edge  of the grocery store. This includes fresh fruits and vegetables, bulk grains, fresh meats, and fresh dairy. °Cooking °· Use low-heat cooking methods, such as baking, instead of high-heat cooking methods like deep frying. °· Cook using healthy oils, such as olive, canola, or sunflower oil. °· Avoid cooking with butter, cream, or high-fat meats. °Meal planning °· Eat meals and snacks regularly, preferably at the same times every day. Avoid going long periods of time without eating. °· Eat foods high in fiber, such as fresh fruits, vegetables, beans, and whole grains. Talk to your dietitian about how many servings of carbohydrates you can eat at each meal. °· Eat 4-6 ounces of lean protein each day, such as lean meat, chicken, fish, eggs, or tofu. 1 ounce is equal to 1 ounce of meat, chicken, or fish, 1 egg, or 1/4 cup of tofu. °· Eat some foods each day that contain healthy fats, such as avocado, nuts, seeds, and fish. °Lifestyle ° °· Check your blood glucose regularly. °· Exercise at least 30 minutes 5 or more days each week, or as told by your health care provider. °· Take medicines as told by your health care provider. °· Do not use any products that contain nicotine or tobacco, such as cigarettes and e-cigarettes. If you need help quitting, ask your health care provider. °· Work with a counselor or diabetes educator to identify strategies to manage stress and any emotional and social challenges. °What are some questions to ask my health care provider? °· Do I need to meet with a diabetes educator? °· Do I need to meet with a dietitian? °· What number can I call if I have questions? °· When are the best times to check my blood glucose? °Where to find more information: °· American Diabetes Association: diabetes.org/food-and-fitness/food °· Academy of Nutrition and Dietetics: www.eatright.org/resources/health/diseases-and-conditions/diabetes °· National Institute of Diabetes and Digestive and Kidney Diseases (NIH):  www.niddk.nih.gov/health-information/diabetes/overview/diet-eating-physical-activity °Summary °· A healthy meal plan will help you control your blood glucose and maintain a healthy lifestyle. °· Working with a diet and nutrition specialist (dietitian) can help you make a meal plan that is best for you. °· Keep in mind that carbohydrates and alcohol have immediate effects on your blood glucose levels. It is important to count carbohydrates and to use alcohol carefully. °This information is not intended to replace advice given to you by your health care provider. Make sure you discuss any questions you have with your health care provider. °Document Released: 11/12/2004 Document Revised: 03/22/2016 Document Reviewed: 03/22/2016 °Elsevier Interactive Patient Education © 2018 Elsevier Inc. ° °

## 2017-12-08 NOTE — Assessment & Plan Note (Signed)
Chronic, stable at this time, asymptomatic.  No iron supplement, continues on B12 daily.  Will obtain CBC and B12 level today.  If CBC continues to be on lower side will consider obtaining iron panel at next visit in three months.  Educated patient on s/s to monitor for and report.

## 2017-12-08 NOTE — Assessment & Plan Note (Signed)
Chronic, ongoing with GFR in May 55.  In presence of T2DM and HTN.  Continue on Losartan for BP and kidney protection.  BMP today.  Consider nephrology consult if progression in CKD.

## 2017-12-08 NOTE — Assessment & Plan Note (Signed)
Chronic, ongoing.  GFR 07/04/17 55 = CKD 3.  Continues on Losartan which offers kidney protection.  A1C today 6.1% and previous in April 2019 6.4%.  Continue on current diabetic medication regimen.  Encourage patient to monitor FSBS at home daily and document + eat diabetic diet.

## 2017-12-08 NOTE — Progress Notes (Signed)
BP (!) 132/58 (BP Location: Left Arm, Patient Position: Sitting)   Pulse 60   Temp 97.9 F (36.6 C) (Oral)   Ht 5\' 11"  (1.803 m)   Wt 207 lb 6.4 oz (94.1 kg)   SpO2 97%   BMI 28.93 kg/m    Subjective:    Patient ID: John [name], male    DOB: 05/21/1940, 77 y.o.   MRN: [id number]  HPI: John [name] is a 77 y.o. male [employer] veteran here for annual physical.  Chief Complaint  Patient presents with  . Annual Exam    pt had wellness exam 11/03/17  . Diabetes   DIABETES Hypoglycemic episodes:no Polydipsia/polyuria: no Visual disturbance: no Chest pain: no Paresthesias: no Glucose Monitoring: no  Accucheck frequency: Not Checking  Fasting glucose:  Post prandial:  Evening:  Before meals: Taking Insulin?: no  Long acting insulin:  Short acting insulin: Blood Pressure Monitoring: not checking Retinal Examination: Up to Date Foot Exam: Up to Date Diabetic Education: Completed Pneumovax: Up to Date Influenza: Up to Date Aspirin: yes  He endorses eating "lots of sweets at home, stating  "I am 77 and I figure when I go, I go".  Does not check blood sugar or blood pressure at home.  Denies polydipsia, polyuria, polyphagia.  Reports no episodes of diaphoresis or light headed.  Reports some numbness at night to toes, which he reports resolves without treatment and does not cause issues with sleep.  HTN/CKD 3: He reports he is going to gym every Monday and Wednesday.  He rides the bicycle and the "walking machine", does some leg and arm weights.  He is also using treadmill and "is up to 7 minutes".  He denies any episodes of SOB or CP with exercise.  States he is able to perform all ADL's without difficulty.  Continues to take his cardiac medicine as ordered daily without report of ADR.  Has a "little" swelling in left foot, but none In right per his report.  Has been present since he was discharged after heart surgery in May.  Sees cardiology on November 12th next.  He report he  had been on Lasix, but "someone stopped that", he is going to discuss with cardiologist on 12th.  ANEMIA: Denies SOB, cold intolerance, syncope episodes.  Denies any increased fatigue, reports "I don't have the energy I once had, but I am doing okay".  Continues on B12 daily.  Does not take iron supplement at this time.  Denies blood in stool or abdominal pain.    Relevant past medical, surgical, family and social history reviewed and updated as indicated. Interim medical history since our last visit reviewed. Allergies and medications reviewed and updated.  Review of Systems  Constitutional: Negative for activity change, fatigue and fever.  HENT: Negative for congestion, ear pain, rhinorrhea, sinus pressure, sinus pain and sore throat.   Eyes: Negative for pain, discharge and redness.  Respiratory: Negative for cough, chest tightness, shortness of breath and wheezing.   Cardiovascular: Positive for leg swelling. Negative for chest pain and palpitations.  Gastrointestinal: Negative for abdominal distention and abdominal pain.  Endocrine: Negative for cold intolerance, polydipsia, polyphagia and polyuria.  Genitourinary: Negative for difficulty urinating.  Musculoskeletal: Negative for back pain, myalgias and neck pain.  Skin: Negative.   Allergic/Immunologic: Negative.   Neurological: Positive for numbness. Negative for dizziness, syncope and headaches.  Hematological: Negative.   Psychiatric/Behavioral: Negative for behavioral problems.      Per HPI unless specifically indicated  above     Objective:    BP (!) 132/58 (BP Location: Left Arm, Patient Position: Sitting)   Pulse 60   Temp 97.9 F (36.6 C) (Oral)   Ht 5\' 11"  (1.803 m)   Wt 207 lb 6.4 oz (94.1 kg)   SpO2 97%   BMI 28.93 kg/m   Wt Readings from Last 3 Encounters:  12/08/17 207 lb 6.4 oz (94.1 kg)  11/03/17 203 lb 12.8 oz (92.4 kg)  08/25/17 206 lb 14.4 oz (93.8 kg)    Physical Exam  Constitutional: He is  oriented to person, place, and time. He appears well-developed and well-nourished.  HENT:  Head: Normocephalic and atraumatic.  Eyes: Pupils are equal, round, and reactive to light. EOM are normal.  Neck: Trachea normal and normal range of motion. Neck supple. No JVD present. Carotid bruit is not present.  Cardiovascular: Normal rate and regular rhythm.  Murmur heard.  Systolic murmur is present with a grade of 2/6. Pulmonary/Chest: Effort normal and breath sounds normal.  Abdominal: Soft. Bowel sounds are normal.  Genitourinary: Rectum normal and penis normal.  Musculoskeletal: Normal range of motion.  Neurological: He is alert and oriented to person, place, and time.  Skin: Skin is warm and dry.  Psychiatric: He has a normal mood and affect. His behavior is normal.  Very pleasant gentleman with no SOB with talking.  Able to follow all simple directions without difficulty.    Diabetic Foot Exam - Simple   Simple Foot Form Visual Inspection No deformities, no ulcerations, no other skin breakdown bilaterally:  Yes Sensation Testing See comments:  Yes Pulse Check See comments:  Yes Comments Decreased T and D pulses bilateral feet with trace edema bilaterally, equal.  Decreased sensation on monofilament bilaterally.  Thick toenails.    Results for orders placed or performed in visit on 09/05/17  HM DIABETES EYE EXAM  Result Value Ref Range   HM Diabetic Eye Exam No Retinopathy No Retinopathy      Assessment & Plan:   Problem List Items Addressed This Visit      Cardiovascular and Mediastinum   Hypertension    Stable on current medication regimen.  Continue on current doses and continue follow-ups with cardiology. Encourage BP monitoring at home.      Relevant Orders   B12   CBC   Basic Metabolic Panel (BMET)   TSH   Bayer DCA Hb A1c Waived (STAT)   Vitamin D (25 hydroxy)     Endocrine   Type 2 diabetes mellitus with chronic kidney disease (HCC)    Chronic, ongoing.   GFR 07/04/17 55 = CKD 3.  Continues on Losartan which offers kidney protection.  A1C today 6.1% and previous in April 2019 6.4%.  Continue on current diabetic medication regimen.  Encourage patient to monitor FSBS at home daily and document + eat diabetic diet.        Genitourinary   CKD (chronic kidney disease) stage 3, GFR 30-59 ml/min (HCC)    Chronic, ongoing with GFR in May 55.  In presence of T2DM and HTN.  Continue on Losartan for BP and kidney protection.  BMP today.  Consider nephrology consult if progression in CKD.        Other   Anemia    Chronic, stable at this time, asymptomatic.  No iron supplement, continues on B12 daily.  Will obtain CBC and B12 level today.  If CBC continues to be on lower side will consider obtaining iron panel  at next visit in three months.  Educated patient on s/s to monitor for and report.          Follow up plan: Return in about 3 months (around 03/10/2018), or if symptoms worsen or fail to improve, for For T2DM and HTN.

## 2017-12-08 NOTE — Assessment & Plan Note (Addendum)
Stable on current medication regimen.  Continue on current doses and continue follow-ups with cardiology. Encourage BP monitoring at home.

## 2017-12-09 LAB — BASIC METABOLIC PANEL
BUN / CREAT RATIO: 18 (ref 10–24)
BUN: 23 mg/dL (ref 8–27)
CO2: 25 mmol/L (ref 20–29)
CREATININE: 1.26 mg/dL (ref 0.76–1.27)
Calcium: 9.1 mg/dL (ref 8.6–10.2)
Chloride: 106 mmol/L (ref 96–106)
GFR calc non Af Amer: 54 mL/min/{1.73_m2} — ABNORMAL LOW (ref >59)
GFR, EST AFRICAN AMERICAN: 62 mL/min/{1.73_m2} (ref >59)
Glucose: 99 mg/dL (ref 65–99)
Potassium: 4.5 mmol/L (ref 3.5–5.2)
Sodium: 146 mmol/L — ABNORMAL HIGH (ref 134–144)

## 2017-12-09 LAB — CBC
HEMOGLOBIN: 9.1 g/dL — AB (ref 13.0–17.7)
Hematocrit: 30.1 % — ABNORMAL LOW (ref 37.5–51.0)
MCH: 25.1 pg — AB (ref 26.6–33.0)
MCHC: 30.2 g/dL — ABNORMAL LOW (ref 31.5–35.7)
MCV: 83 fL (ref 79–97)
Platelets: 188 10*3/uL (ref 150–450)
RBC: 3.63 x10E6/uL — ABNORMAL LOW (ref 4.14–5.80)
RDW: 14.3 % (ref 12.3–15.4)
WBC: 5.3 10*3/uL (ref 3.4–10.8)

## 2017-12-09 LAB — TSH: TSH: 5.23 u[IU]/mL — AB (ref 0.450–4.500)

## 2017-12-09 LAB — VITAMIN B12: Vitamin B-12: 311 pg/mL (ref 232–1245)

## 2017-12-09 LAB — VITAMIN D 25 HYDROXY (VIT D DEFICIENCY, FRACTURES): Vit D, 25-Hydroxy: 32.5 ng/mL (ref 30.0–100.0)

## 2018-01-02 ENCOUNTER — Other Ambulatory Visit: Payer: Self-pay | Admitting: Unknown Physician Specialty

## 2018-01-03 NOTE — Telephone Encounter (Signed)
Requested Prescriptions  Pending Prescriptions Disp Refills  . KLOR-CON M10 10 MEQ tablet [Pharmacy Med Name: KLOR-CON M10 ER 10MEQ TAB] 90 tablet 1    Sig: TAKE 1 TABLET BY MOUTH ONCE DAILY     Endocrinology:  Minerals - Potassium Supplementation Passed - 01/02/2018  9:48 AM      Passed - K in normal range and within 360 days    Potassium  Date Value Ref Range Status  12/08/2017 4.5 3.5 - 5.2 mmol/L Final  12/13/2012 3.4 (L) 3.5 - 5.1 mmol/L Final         Passed - Cr in normal range and within 360 days    Creatinine  Date Value Ref Range Status  12/13/2012 0.98 0.60 - 1.30 mg/dL Final   Creatinine, Ser  Date Value Ref Range Status  12/08/2017 1.26 0.76 - 1.27 mg/dL Final         Passed - Valid encounter within last 12 months    Recent Outpatient Visits          3 weeks ago Essential hypertension   [place], [place] T, NP   6 months ago Anemia, unspecified type   [facility] Kathrine Haddock, NP   7 months ago Type 2 diabetes mellitus with stage 1 chronic kidney disease, without long-term current use of insulin ([place])   [facility] Kathrine Haddock, NP   9 months ago Chest pain, unspecified type   [facility] [place], Jeannette How, MD   1 year ago Pure hypercholesterolemia   [facility] Kathrine Haddock, NP      Future Appointments            In 2 months Cannady, Barbaraann Faster, NP [employer]

## 2018-01-06 ENCOUNTER — Other Ambulatory Visit: Payer: Self-pay | Admitting: Pharmacist

## 2018-01-06 NOTE — Patient Outreach (Signed)
[facility]) Care Management  01/06/2018  JAMMIE CLINK 1940/05/13 [id number]   Outreach call to [name] regarding his request for follow up from the [facility] Medication Adherence Campaign. Left a HIPAA compliant message on the patient's voicemail.  Harlow Asa, PharmD, [place] [facility] [phone]

## 2018-01-23 DIAGNOSIS — I10 Essential (primary) hypertension: Secondary | ICD-10-CM | POA: Diagnosis not present

## 2018-01-23 DIAGNOSIS — E785 Hyperlipidemia, unspecified: Secondary | ICD-10-CM | POA: Diagnosis not present

## 2018-01-23 DIAGNOSIS — E119 Type 2 diabetes mellitus without complications: Secondary | ICD-10-CM | POA: Diagnosis not present

## 2018-01-23 DIAGNOSIS — Z951 Presence of aortocoronary bypass graft: Secondary | ICD-10-CM | POA: Diagnosis not present

## 2018-01-23 DIAGNOSIS — Z952 Presence of prosthetic heart valve: Secondary | ICD-10-CM | POA: Diagnosis not present

## 2018-01-23 DIAGNOSIS — R079 Chest pain, unspecified: Secondary | ICD-10-CM | POA: Diagnosis not present

## 2018-01-23 DIAGNOSIS — I350 Nonrheumatic aortic (valve) stenosis: Secondary | ICD-10-CM | POA: Diagnosis not present

## 2018-02-03 ENCOUNTER — Encounter: Payer: Self-pay | Admitting: Urology

## 2018-02-03 ENCOUNTER — Ambulatory Visit: Payer: PPO | Admitting: Urology

## 2018-02-03 VITALS — BP 132/65 | HR 82 | Ht 71.0 in | Wt 213.6 lb

## 2018-02-03 DIAGNOSIS — Z8551 Personal history of malignant neoplasm of bladder: Secondary | ICD-10-CM

## 2018-02-03 LAB — MICROSCOPIC EXAMINATION
EPITHELIAL CELLS (NON RENAL): NONE SEEN /HPF (ref 0–10)
WBC UA: NONE SEEN /HPF (ref 0–5)

## 2018-02-03 LAB — URINALYSIS, COMPLETE
BILIRUBIN UA: NEGATIVE
Glucose, UA: NEGATIVE
KETONES UA: NEGATIVE
LEUKOCYTES UA: NEGATIVE
NITRITE UA: NEGATIVE
PH UA: 6.5 (ref 5.0–7.5)
Protein, UA: NEGATIVE
RBC, UA: NEGATIVE
Specific Gravity, UA: 1.02 (ref 1.005–1.030)
Urobilinogen, Ur: 0.2 mg/dL (ref 0.2–1.0)

## 2018-02-03 MED ORDER — LIDOCAINE HCL URETHRAL/MUCOSAL 2 % EX GEL: 1.0000 "application " | Freq: Once | CUTANEOUS | Status: DC

## 2018-02-03 NOTE — Progress Notes (Signed)
° °  02/03/2018   CC:  Chief Complaint  Patient presents with   Cysto    HPI: John [name] is a 77 y.o. male who presents today for a cystoscopy. He has a history of bladder cancer.  Urologic cancer history: 03/2013: distal right ureterectomy and reimplant for low grade UTUC 03/2014: TURBT, LGTa 04/2016: TURBT, PUNLMP 06/2016: surveillance cysto normal 10/2016: surveillance cysto normal  08/25/2017: surveillance cysto normal  Blood pressure 132/65, pulse 82, height 5\' 11"  (1.803 m), weight 213 lb 9.6 oz (96.9 kg). NED. A&Ox3.   No respiratory distress   Abd soft, NT, ND Normal phallus with bilateral descended testicles  Cystoscopy Procedure Note  Patient identification was confirmed, informed consent was obtained, and patient was prepped using Betadine solution.  Lidocaine jelly was administered per urethral meatus.     Pre-Procedure: - Inspection reveals a normal caliber ureteral meatus.  Procedure: The flexible cystoscope was introduced without difficulty - No urethral strictures/lesions are present. - Mildly Enlarged prostate  - Normal bladder neck - Bilateral ureteral orifices identified -- right UO reimplanted right superior. I could get into it for a couple of cm's and it appeared normal. - Bladder mucosa reveals broad based papillary tumor on right posterior wall. Approx 2 cm aggregate.  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 The indications and nature of the planned procedure were discussed as well as the potential benefits and expected outcome.  Alternatives have been discussed.  Potential complications were discussed including but not limited to bleeding, infection and bladder perforation. The postoperative care and followup was reviewed.  All of his questions were  answered and his desires to proceed. - Will plan post treatment gencitabine   Stoioff, Ronda Fairly, MD  I, Temidayo Atanda-[place] , am acting as a scribe for [employer], MD  I, Abbie Sons, MD, have reviewed all documentation for this visit. The documentation on 02/05/18 for the exam, diagnosis, procedures, and orders are all accurate and complete.   John Giovanni, MD

## 2018-02-07 ENCOUNTER — Emergency Department
Admission: EM | Admit: 2018-02-07 | Discharge: 2018-02-07 | Disposition: A | Discharge status: Home / Self Care | Payer: PPO | Source: Home / Self Care | Attending: Emergency Medicine | Admitting: Emergency Medicine

## 2018-02-07 ENCOUNTER — Other Ambulatory Visit: Payer: Self-pay

## 2018-02-07 DIAGNOSIS — N3001 Acute cystitis with hematuria: Secondary | ICD-10-CM | POA: Diagnosis not present

## 2018-02-07 DIAGNOSIS — Z8249 Family history of ischemic heart disease and other diseases of the circulatory system: Secondary | ICD-10-CM | POA: Diagnosis not present

## 2018-02-07 DIAGNOSIS — M1990 Unspecified osteoarthritis, unspecified site: Secondary | ICD-10-CM | POA: Diagnosis not present

## 2018-02-07 DIAGNOSIS — N183 Chronic kidney disease, stage 3 (moderate): Secondary | ICD-10-CM | POA: Diagnosis not present

## 2018-02-07 DIAGNOSIS — G9341 Metabolic encephalopathy: Secondary | ICD-10-CM | POA: Diagnosis not present

## 2018-02-07 DIAGNOSIS — I2510 Atherosclerotic heart disease of native coronary artery without angina pectoris: Secondary | ICD-10-CM | POA: Diagnosis not present

## 2018-02-07 DIAGNOSIS — Z952 Presence of prosthetic heart valve: Secondary | ICD-10-CM | POA: Diagnosis not present

## 2018-02-07 DIAGNOSIS — Z86010 Personal history of colonic polyps: Secondary | ICD-10-CM | POA: Diagnosis not present

## 2018-02-07 DIAGNOSIS — R0689 Other abnormalities of breathing: Secondary | ICD-10-CM | POA: Diagnosis not present

## 2018-02-07 DIAGNOSIS — Z66 Do not resuscitate: Secondary | ICD-10-CM | POA: Diagnosis not present

## 2018-02-07 DIAGNOSIS — I10 Essential (primary) hypertension: Secondary | ICD-10-CM | POA: Diagnosis not present

## 2018-02-07 DIAGNOSIS — Z951 Presence of aortocoronary bypass graft: Secondary | ICD-10-CM | POA: Diagnosis not present

## 2018-02-07 DIAGNOSIS — R410 Disorientation, unspecified: Secondary | ICD-10-CM | POA: Diagnosis not present

## 2018-02-07 DIAGNOSIS — A419 Sepsis, unspecified organism: Secondary | ICD-10-CM | POA: Diagnosis not present

## 2018-02-07 DIAGNOSIS — R0902 Hypoxemia: Secondary | ICD-10-CM | POA: Diagnosis not present

## 2018-02-07 DIAGNOSIS — Z85828 Personal history of other malignant neoplasm of skin: Secondary | ICD-10-CM | POA: Diagnosis not present

## 2018-02-07 DIAGNOSIS — R531 Weakness: Secondary | ICD-10-CM | POA: Diagnosis not present

## 2018-02-07 DIAGNOSIS — I248 Other forms of acute ischemic heart disease: Secondary | ICD-10-CM | POA: Diagnosis not present

## 2018-02-07 DIAGNOSIS — R0602 Shortness of breath: Secondary | ICD-10-CM | POA: Diagnosis not present

## 2018-02-07 DIAGNOSIS — Z809 Family history of malignant neoplasm, unspecified: Secondary | ICD-10-CM | POA: Diagnosis not present

## 2018-02-07 DIAGNOSIS — R000 Tachycardia, unspecified: Secondary | ICD-10-CM | POA: Diagnosis not present

## 2018-02-07 DIAGNOSIS — D631 Anemia in chronic kidney disease: Secondary | ICD-10-CM | POA: Diagnosis not present

## 2018-02-07 DIAGNOSIS — I5032 Chronic diastolic (congestive) heart failure: Secondary | ICD-10-CM | POA: Diagnosis not present

## 2018-02-07 DIAGNOSIS — E785 Hyperlipidemia, unspecified: Secondary | ICD-10-CM | POA: Diagnosis not present

## 2018-02-07 DIAGNOSIS — I959 Hypotension, unspecified: Secondary | ICD-10-CM | POA: Diagnosis not present

## 2018-02-07 DIAGNOSIS — E1122 Type 2 diabetes mellitus with diabetic chronic kidney disease: Secondary | ICD-10-CM | POA: Diagnosis not present

## 2018-02-07 DIAGNOSIS — B952 Enterococcus as the cause of diseases classified elsewhere: Secondary | ICD-10-CM | POA: Diagnosis not present

## 2018-02-07 DIAGNOSIS — D649 Anemia, unspecified: Secondary | ICD-10-CM | POA: Diagnosis not present

## 2018-02-07 DIAGNOSIS — I130 Hypertensive heart and chronic kidney disease with heart failure and stage 1 through stage 4 chronic kidney disease, or unspecified chronic kidney disease: Secondary | ICD-10-CM | POA: Diagnosis not present

## 2018-02-07 DIAGNOSIS — R061 Stridor: Secondary | ICD-10-CM | POA: Diagnosis not present

## 2018-02-07 DIAGNOSIS — I447 Left bundle-branch block, unspecified: Secondary | ICD-10-CM | POA: Diagnosis not present

## 2018-02-07 DIAGNOSIS — R6520 Severe sepsis without septic shock: Secondary | ICD-10-CM | POA: Diagnosis not present

## 2018-02-07 DIAGNOSIS — R7989 Other specified abnormal findings of blood chemistry: Secondary | ICD-10-CM | POA: Diagnosis not present

## 2018-02-07 DIAGNOSIS — R012 Other cardiac sounds: Secondary | ICD-10-CM | POA: Diagnosis not present

## 2018-02-07 DIAGNOSIS — Z87891 Personal history of nicotine dependence: Secondary | ICD-10-CM | POA: Diagnosis not present

## 2018-02-07 DIAGNOSIS — Z8551 Personal history of malignant neoplasm of bladder: Secondary | ICD-10-CM | POA: Diagnosis not present

## 2018-02-07 DIAGNOSIS — N3000 Acute cystitis without hematuria: Secondary | ICD-10-CM | POA: Diagnosis not present

## 2018-02-07 LAB — URINALYSIS, COMPLETE (UACMP) WITH MICROSCOPIC
Bacteria, UA: NONE SEEN
Bilirubin Urine: NEGATIVE
Glucose, UA: 50 mg/dL — AB
Hgb urine dipstick: NEGATIVE
Ketones, ur: 5 mg/dL — AB
Nitrite: NEGATIVE
Protein, ur: 30 mg/dL — AB
SPECIFIC GRAVITY, URINE: 1.019 (ref 1.005–1.030)
Squamous Epithelial / HPF: NONE SEEN (ref 0–5)
pH: 6 (ref 5.0–8.0)

## 2018-02-07 LAB — CBC WITH DIFFERENTIAL/PLATELET
Abs Immature Granulocytes: 0.04 10*3/uL (ref 0.00–0.07)
Basophils Absolute: 0 10*3/uL (ref 0.0–0.1)
Basophils Relative: 0 %
Eosinophils Absolute: 0 10*3/uL (ref 0.0–0.5)
Eosinophils Relative: 0 %
HCT: 29.7 % — ABNORMAL LOW (ref 39.0–52.0)
Hemoglobin: 9 g/dL — ABNORMAL LOW (ref 13.0–17.0)
Immature Granulocytes: 1 %
LYMPHS ABS: 0.5 10*3/uL — AB (ref 0.7–4.0)
LYMPHS PCT: 6 %
MCH: 24.6 pg — AB (ref 26.0–34.0)
MCHC: 30.3 g/dL (ref 30.0–36.0)
MCV: 81.1 fL (ref 80.0–100.0)
MONO ABS: 0.6 10*3/uL (ref 0.1–1.0)
Monocytes Relative: 7 %
Neutro Abs: 7.2 10*3/uL (ref 1.7–7.7)
Neutrophils Relative %: 86 %
Platelets: 174 10*3/uL (ref 150–400)
RBC: 3.66 MIL/uL — ABNORMAL LOW (ref 4.22–5.81)
RDW: 15.1 % (ref 11.5–15.5)
WBC: 8.4 10*3/uL (ref 4.0–10.5)
nRBC: 0 % (ref 0.0–0.2)

## 2018-02-07 LAB — COMPREHENSIVE METABOLIC PANEL
ALT: 22 U/L (ref 0–44)
AST: 33 U/L (ref 15–41)
Albumin: 4 g/dL (ref 3.5–5.0)
Alkaline Phosphatase: 50 U/L (ref 38–126)
Anion gap: 10 (ref 5–15)
BUN: 22 mg/dL (ref 8–23)
CO2: 23 mmol/L (ref 22–32)
Calcium: 8.4 mg/dL — ABNORMAL LOW (ref 8.9–10.3)
Chloride: 105 mmol/L (ref 98–111)
Creatinine, Ser: 1.3 mg/dL — ABNORMAL HIGH (ref 0.61–1.24)
GFR calc Af Amer: >60 mL/min (ref >60)
GFR calc non Af Amer: 52 mL/min — ABNORMAL LOW (ref >60)
Glucose, Bld: 189 mg/dL — ABNORMAL HIGH (ref 70–99)
Potassium: 3.7 mmol/L (ref 3.5–5.1)
Sodium: 138 mmol/L (ref 135–145)
Total Bilirubin: 1 mg/dL (ref 0.3–1.2)
Total Protein: 7.1 g/dL (ref 6.5–8.1)

## 2018-02-07 LAB — TROPONIN I: Troponin I: <0.03 ng/mL (ref <0.03)

## 2018-02-07 LAB — AMMONIA: Ammonia: 15 umol/L (ref 9–35)

## 2018-02-07 MED ORDER — CEPHALEXIN 500 MG PO CAPS: 500.0000 mg | ORAL_CAPSULE | Freq: Three times a day (TID) | ORAL | 0 refills | Status: DC

## 2018-02-07 MED ORDER — CEPHALEXIN 500 MG PO CAPS
500.0000 mg | ORAL_CAPSULE | Freq: Once | ORAL | Status: AC
  Administered 2018-02-07: 500 mg via ORAL
  Filled 2018-02-07: qty 1

## 2018-02-07 NOTE — ED Triage Notes (Signed)
Pt arrives via ems from home. EMS reports they were called out for weakness. Wife reported to ems that pt was acting abnormal last few days with increase in weakness, and unsteadiness on feet, pt has been forgetful over the last week. Pt a&o x 4 on arrival. No acute distress noted at this time. Pt able to slide self from ems stretcher onto ed stretcher.

## 2018-02-07 NOTE — ED Provider Notes (Signed)
[facility] Emergency Department Provider Note  ____________________________________________  Time seen: Approximately 8:38 PM  I have reviewed the triage vital signs and the nursing notes.   HISTORY  Chief Complaint Fatigue   HPI John [name] is a 77 y.o. male with a history of bladder cancer not currently undergoing any treatment, diabetes, chronic kidney disease, CHF, CAD, hypertension, hyperlipidemia who presents for evaluation of confusion.  According to the wife patient has had several intermittent episodes of confusion, generalized weakness and looked unsteady on his feet.  She noticed mild symptoms this morning.  She had cataract surgery today and when she woke up from her nap this afternoon she noticed that the patient was slightly worse which prompted the visit to the emergency room.  He had similar presentation in the past in the setting of a UTI which she was concerned about.  Patient does report urgency and increased urinary frequency but no dysuria, no abdominal pain, nausea, vomiting, flank pain, fever or chills.  No chest pain or shortness of breath, no URI symptoms.  No neurological deficits, no headache.  Past Medical History:  Diagnosis Date  . Anemia   . Anginal pain ([place])   . Aortic stenosis   . Cancer ([place])    bladder  . Cataracts, bilateral   . CHF (congestive heart failure) ([place])   . Chronic kidney disease   . Coronary artery disease   . Diabetes mellitus without complication ([place])    takes Janumet daily...dx approx. 2009  . Dyspnea   . Dysrhythmia   . Heart murmur   . History of colon polyps    benign  . Hyperlipidemia    takes Lovastatin daily  . Hypertension    takes Hyzaar and Amlodipine daily  . Joint pain   . Medical history non-contributory   . Muscle cramps    occasionally in legs   . Osteoarthritis   . Pre-diabetes   . Skin cancer of face   . Urinary urgency     Patient Active Problem List   Diagnosis  Date Noted  . CKD (chronic kidney disease) stage 3, GFR 30-59 ml/min (HCC) 12/08/2017  . Insomnia 07/04/2017  . 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 Advance care planning 12/07/2016  . DNR (do not resuscitate) 12/07/2016  . Status post right knee replacement 02/06/2016  . Osteoarthritis of right knee 01/12/2016  . Aortic valve stenosis, moderate 05/30/2015  . Bladder cancer ([place]) 11/15/2014  . Hyperlipidemia 11/15/2014  . ED (erectile dysfunction) 11/15/2014  . Localized osteoarthritis of right shoulder 11/15/2014  . Hypertension 11/15/2014  . Heart murmur 11/15/2014  . Type 2 diabetes mellitus with chronic kidney disease ([place]) 11/15/2014  . Anemia 11/15/2014    Past Surgical History:  Procedure Laterality Date  . AORTIC VALVE REPLACEMENT N/A 05/05/2017   Procedure: AORTIC VALVE REPLACEMENT (AVR) USING MAGNA EASE PERICARDIAL BIOPROSTHESIS - AORTIC VALVE MODEL 3300TFX, SIZE 23 MM;  Surgeon: Gaye Pollack, MD;  Location: [place];  Service: Open Heart Surgery;  Laterality: N/A;  . BLADDER SURGERY    . CARDIAC VALVE REPLACEMENT    . COLONOSCOPY    . CORONARY ARTERY BYPASS GRAFT N/A 05/05/2017   Procedure: CORONARY ARTERY BYPASS GRAFTING (CABG) x 4 WITH ENDOSCOPIC HARVESTING OF RIGHT SAPHENOUS VEIN;  Surgeon: Gaye Pollack, MD;  Location: [place];  Service: Open Heart Surgery;  Laterality: N/A;  . CYSTOURETHROSCOPY    . JOINT REPLACEMENT Left    hip  . KNEE ARTHROPLASTY  Right 01/12/2016   Procedure: RIGHT  TOTAL KNEE ARTHROPLASTY WITH COMPUTER NAVIGATION;  Surgeon: Rod Can, MD;  Location: [place];  Service: Orthopedics;  Laterality: Right;  Needs RNFA  . NO PAST SURGERIES    . RIGHT/LEFT HEART CATH AND CORONARY ANGIOGRAPHY N/A 04/21/2017   Procedure: RIGHT/LEFT HEART CATH AND CORONARY ANGIOGRAPHY;  Surgeon: Isaias Cowman, MD;  Location: [place] [facility];  Service: Cardiovascular;  Laterality: N/A;  . SKIN SURGERY Left    arm  . TEE WITHOUT CARDIOVERSION N/A 05/05/2017   Procedure:  TRANSESOPHAGEAL ECHOCARDIOGRAM (TEE);  Surgeon: Gaye Pollack, MD;  Location: [place];  Service: Open Heart Surgery;  Laterality: N/A;    Prior to Admission medications   Medication Sig Start Date End Date Taking? Authorizing Provider  amLODipine (NORVASC) 5 MG tablet Take 5 mg by mouth daily. 11/22/12   [provider]  Ascorbic Acid (VITAMIN C) 1000 MG tablet Take 1,000 mg by mouth daily.    [provider]  aspirin 81 MG EC tablet Take 1 tablet (81 mg total) by mouth daily. 05/11/17   Elgie Collard, PA-C  atorvastatin (LIPITOR) 40 MG tablet Take 1 tablet (40 mg total) by mouth daily at 6 PM. 05/10/17   Elgie Collard, PA-C  cephALEXin (KEFLEX) 500 MG capsule Take 1 capsule (500 mg total) by mouth 3 (three) times daily for 7 days. 02/07/18 02/14/18  Rudene Re, MD  cholecalciferol (VITAMIN D) 1000 units tablet Take 2,000 Units by mouth daily.    [provider]  Cyanocobalamin (RA VITAMIN B-12 TR) 1000 MCG TBCR Take 1,000 mcg by mouth daily.     [provider]  furosemide (LASIX) 40 MG tablet Take 1 tablet (40 mg total) by mouth daily. 05/16/17   Bartle, Fernande Boyden, MD  KLOR-CON M10 10 MEQ tablet TAKE 1 TABLET BY MOUTH ONCE DAILY 01/03/18   Cannady, Henrine Screws T, NP  losartan (COZAAR) 50 MG tablet Take 50 mg by mouth daily.    [provider]  metoprolol tartrate (LOPRESSOR) 25 MG tablet Take 1 tablet (25 mg total) by mouth 2 (two) times daily. 05/10/17   Elgie Collard, PA-C  SitaGLIPtin-MetFORMIN HCl (JANUMET XR) 50-1000 MG TB24 Take 1 tablet by mouth 2 (two) times daily. 09/16/17   Kathrine Haddock, NP    Allergies Aspirin  Family History  Problem Relation Age of Onset  . Dementia Mother   . Stroke Mother   . Stroke Father   . Diabetes Father   . Heart disease Father   . Cancer Sister   . Hypertension Brother   . Cancer Sister     Social History Social History   Tobacco Use  . Smoking status: Former Smoker    Years: 25.00    Last  attempt to quit: 12/15/1980    Years since quitting: 37.1  . Smokeless tobacco: Never Used  Substance Use Topics  . Alcohol use: No    Alcohol/week: 0.0 standard drinks  . Drug use: No    Review of Systems  Constitutional: Negative for fever. + Weakness and dizziness Eyes: Negative for visual changes. ENT: Negative for sore throat. Neck: No neck pain  Cardiovascular: Negative for chest pain. Respiratory: Negative for shortness of breath. Gastrointestinal: Negative for abdominal pain, vomiting or diarrhea. Genitourinary: Negative for dysuria. + Frequency and urgency Musculoskeletal: Negative for back pain. Skin: Negative for rash. Neurological: Negative for headaches, weakness or numbness. Psych: No SI or HI  ____________________________________________   PHYSICAL EXAM:  VITAL SIGNS: ED Triage Vitals  Enc Vitals Group     BP 02/07/18 1836 (!) 164/63     Pulse Rate 02/07/18 1836 97     Resp 02/07/18 1836 16     Temp 02/07/18 1836 99.5 F (37.5 C)     Temp Source 02/07/18 1836 Oral     SpO2 02/07/18 1836 100 %     Weight 02/07/18 1839 211 lb (95.7 kg)     Height 02/07/18 1839 5\' 11"  (1.803 m)     Head Circumference --      Peak Flow --      Pain Score 02/07/18 1839 0     Pain Loc --      Pain Edu? --      Excl. in [place]? --     Constitutional: Alert and oriented. Well appearing and in no apparent distress. HEENT:      Head: Normocephalic and atraumatic.         Eyes: Conjunctivae are normal. Sclera is non-icteric.       Mouth/Throat: Mucous membranes are moist.       Neck: Supple with no signs of meningismus. Cardiovascular: Regular rate and rhythm. No murmurs, gallops, or rubs. 2+ symmetrical distal pulses are present in all extremities. No JVD. Respiratory: Normal respiratory effort. Lungs are clear to auscultation bilaterally. No wheezes, crackles, or rhonchi.  Gastrointestinal: Soft, non tender, and non distended with positive bowel sounds. No rebound or  guarding. Genitourinary: No CVA tenderness. Musculoskeletal: Nontender with normal range of motion in all extremities. No edema, cyanosis, or erythema of extremities. Neurologic: Normal speech and language.  Pupils are equal round and reactive to light, extraocular movements are intact, face symmetric, sensation and strength are normal x4, no pronator drift, no dysmetria on finger-to-nose finger, normal gait. Skin: Skin is warm, dry and intact. No rash noted. Psychiatric: Mood and affect are normal. Speech and behavior are normal.  ____________________________________________   LABS (all labs ordered are listed, but only abnormal results are displayed)  Labs Reviewed  URINALYSIS, COMPLETE (UACMP) WITH MICROSCOPIC - Abnormal; Notable for the following components:      Result Value   Color, Urine YELLOW (*)    APPearance CLEAR (*)    Glucose, UA 50 (*)    Ketones, ur 5 (*)    Protein, ur 30 (*)    Leukocytes, UA TRACE (*)    All other components within normal limits  CBC WITH DIFFERENTIAL/PLATELET - Abnormal; Notable for the following components:   RBC 3.66 (*)    Hemoglobin 9.0 (*)    HCT 29.7 (*)    MCH 24.6 (*)    Lymphs Abs 0.5 (*)    All other components within normal limits  COMPREHENSIVE METABOLIC PANEL - Abnormal; Notable for the following components:   Glucose, Bld 189 (*)    Creatinine, Ser 1.30 (*)    Calcium 8.4 (*)    GFR calc non Af Amer 52 (*)    All other components within normal limits  URINE CULTURE  TROPONIN I  AMMONIA   ____________________________________________  EKG  ED ECG REPORT I, Rudene Re, the attending physician, personally viewed and interpreted this ECG.  Normal sinus rhythm, rate of 100, LBBB, prolonged QTC, normal axis, no ST elevations or depressions, ST depressions on inferior and lateral leads.  EKG is unchanged from prior. ____________________________________________  RADIOLOGY  none    ____________________________________________   PROCEDURES  Procedure(s) performed: None Procedures Critical Care performed:  None ____________________________________________  INITIAL IMPRESSION / ASSESSMENT AND PLAN / ED COURSE  77 y.o. male with a history of bladder cancer not currently undergoing any treatment, diabetes, chronic kidney disease, CHF, CAD, hypertension, hyperlipidemia who presents for evaluation of confusion.  Wife concerned the patient might have a UTI as he had similar presentation in the past.  Did have a cystoscopy last week and he is complaining of frequency and urgency.  UA showing trace leuks and 21-50 WBCs but no nitrites or bacteria.  However since patient is symptomatic we will treat with Keflex.  No sepsis.  Normal white count.  No other acute findings on exam or labs.  Patient is neurologically intact with no evidence of stroke.  EKG and troponin with no evidence of ischemia.  Patient remains at baseline in the emergency room with no confusion, alert and oriented x3.  Discussed return precautions and close follow-up with primary care doctor with patient and his wife.      As part of my medical decision making, I reviewed the following data within the [place] History obtained from family, Nursing notes reviewed and incorporated, Labs reviewed , EKG interpreted , Old EKG reviewed, Old chart reviewed, Notes from prior ED visits and [place] Controlled Substance Database    Pertinent labs & imaging results that were available during my care of the patient were reviewed by me and considered in my medical decision making (see chart for details).    ____________________________________________   FINAL CLINICAL IMPRESSION(S) / ED DIAGNOSES  Final diagnoses:  Confusion  Acute cystitis with hematuria      NEW MEDICATIONS STARTED DURING THIS VISIT:  ED Discharge Orders         Ordered    cephALEXin (KEFLEX) 500 MG capsule  3 times daily      02/07/18 2037           Note:  This document was prepared using Dragon voice recognition software and may include unintentional dictation errors.    Rudene Re, MD 02/07/18 [phone]

## 2018-02-07 NOTE — ED Notes (Signed)
Pt able to ambulate to restroom, appears unsteady on feet. Clothing soiled from episode of incontinents prior to using restroom. Pt placed in blue scrubs.

## 2018-02-08 ENCOUNTER — Encounter: Payer: Self-pay | Admitting: Emergency Medicine

## 2018-02-08 ENCOUNTER — Emergency Department: Payer: PPO

## 2018-02-08 ENCOUNTER — Inpatient Hospital Stay
Admit: 2018-02-08 | Discharge: 2018-02-08 | Disposition: A | Discharge status: Home / Self Care | Payer: PPO | Attending: Internal Medicine | Admitting: Internal Medicine

## 2018-02-08 ENCOUNTER — Other Ambulatory Visit: Payer: Self-pay

## 2018-02-08 ENCOUNTER — Inpatient Hospital Stay
Admission: EM | Admit: 2018-02-08 | Discharge: 2018-02-10 | DRG: 871 | Disposition: A | Discharge status: Home / Self Care | Payer: PPO | Attending: Internal Medicine | Admitting: Internal Medicine

## 2018-02-08 ENCOUNTER — Inpatient Hospital Stay: Payer: PPO

## 2018-02-08 DIAGNOSIS — A419 Sepsis, unspecified organism: Secondary | ICD-10-CM | POA: Diagnosis present

## 2018-02-08 DIAGNOSIS — I130 Hypertensive heart and chronic kidney disease with heart failure and stage 1 through stage 4 chronic kidney disease, or unspecified chronic kidney disease: Secondary | ICD-10-CM | POA: Diagnosis present

## 2018-02-08 DIAGNOSIS — M1990 Unspecified osteoarthritis, unspecified site: Secondary | ICD-10-CM | POA: Diagnosis present

## 2018-02-08 DIAGNOSIS — I5032 Chronic diastolic (congestive) heart failure: Secondary | ICD-10-CM | POA: Diagnosis present

## 2018-02-08 DIAGNOSIS — Z7982 Long term (current) use of aspirin: Secondary | ICD-10-CM

## 2018-02-08 DIAGNOSIS — Z951 Presence of aortocoronary bypass graft: Secondary | ICD-10-CM

## 2018-02-08 DIAGNOSIS — G9341 Metabolic encephalopathy: Secondary | ICD-10-CM | POA: Diagnosis present

## 2018-02-08 DIAGNOSIS — R061 Stridor: Secondary | ICD-10-CM | POA: Diagnosis not present

## 2018-02-08 DIAGNOSIS — Z86010 Personal history of colonic polyps: Secondary | ICD-10-CM

## 2018-02-08 DIAGNOSIS — R778 Other specified abnormalities of plasma proteins: Secondary | ICD-10-CM

## 2018-02-08 DIAGNOSIS — I248 Other forms of acute ischemic heart disease: Secondary | ICD-10-CM | POA: Diagnosis present

## 2018-02-08 DIAGNOSIS — R531 Weakness: Secondary | ICD-10-CM | POA: Diagnosis present

## 2018-02-08 DIAGNOSIS — E1122 Type 2 diabetes mellitus with diabetic chronic kidney disease: Secondary | ICD-10-CM | POA: Diagnosis present

## 2018-02-08 DIAGNOSIS — B952 Enterococcus as the cause of diseases classified elsewhere: Secondary | ICD-10-CM | POA: Diagnosis present

## 2018-02-08 DIAGNOSIS — Z952 Presence of prosthetic heart valve: Secondary | ICD-10-CM

## 2018-02-08 DIAGNOSIS — I2510 Atherosclerotic heart disease of native coronary artery without angina pectoris: Secondary | ICD-10-CM | POA: Diagnosis present

## 2018-02-08 DIAGNOSIS — Z8551 Personal history of malignant neoplasm of bladder: Secondary | ICD-10-CM

## 2018-02-08 DIAGNOSIS — E785 Hyperlipidemia, unspecified: Secondary | ICD-10-CM | POA: Diagnosis present

## 2018-02-08 DIAGNOSIS — Z8249 Family history of ischemic heart disease and other diseases of the circulatory system: Secondary | ICD-10-CM | POA: Diagnosis not present

## 2018-02-08 DIAGNOSIS — Z833 Family history of diabetes mellitus: Secondary | ICD-10-CM

## 2018-02-08 DIAGNOSIS — Z87891 Personal history of nicotine dependence: Secondary | ICD-10-CM

## 2018-02-08 DIAGNOSIS — R0902 Hypoxemia: Secondary | ICD-10-CM | POA: Diagnosis present

## 2018-02-08 DIAGNOSIS — D631 Anemia in chronic kidney disease: Secondary | ICD-10-CM | POA: Diagnosis present

## 2018-02-08 DIAGNOSIS — Z66 Do not resuscitate: Secondary | ICD-10-CM | POA: Diagnosis present

## 2018-02-08 DIAGNOSIS — Z809 Family history of malignant neoplasm, unspecified: Secondary | ICD-10-CM

## 2018-02-08 DIAGNOSIS — N3000 Acute cystitis without hematuria: Secondary | ICD-10-CM

## 2018-02-08 DIAGNOSIS — R600 Localized edema: Secondary | ICD-10-CM

## 2018-02-08 DIAGNOSIS — R6520 Severe sepsis without septic shock: Secondary | ICD-10-CM

## 2018-02-08 DIAGNOSIS — R7989 Other specified abnormal findings of blood chemistry: Secondary | ICD-10-CM

## 2018-02-08 DIAGNOSIS — Z79899 Other long term (current) drug therapy: Secondary | ICD-10-CM

## 2018-02-08 DIAGNOSIS — Z886 Allergy status to analgesic agent status: Secondary | ICD-10-CM

## 2018-02-08 DIAGNOSIS — N183 Chronic kidney disease, stage 3 (moderate): Secondary | ICD-10-CM | POA: Diagnosis present

## 2018-02-08 DIAGNOSIS — Z85828 Personal history of other malignant neoplasm of skin: Secondary | ICD-10-CM | POA: Diagnosis not present

## 2018-02-08 LAB — CBC WITH DIFFERENTIAL/PLATELET
Abs Immature Granulocytes: 0.07 10*3/uL (ref 0.00–0.07)
Basophils Absolute: 0 10*3/uL (ref 0.0–0.1)
Basophils Relative: 0 %
Eosinophils Absolute: 0 10*3/uL (ref 0.0–0.5)
Eosinophils Relative: 0 %
HCT: 30.4 % — ABNORMAL LOW (ref 39.0–52.0)
Hemoglobin: 9.2 g/dL — ABNORMAL LOW (ref 13.0–17.0)
Immature Granulocytes: 1 %
Lymphocytes Relative: 5 %
Lymphs Abs: 0.6 10*3/uL — ABNORMAL LOW (ref 0.7–4.0)
MCH: 24.5 pg — ABNORMAL LOW (ref 26.0–34.0)
MCHC: 30.3 g/dL (ref 30.0–36.0)
MCV: 81.1 fL (ref 80.0–100.0)
MONOS PCT: 8 %
Monocytes Absolute: 1 10*3/uL (ref 0.1–1.0)
NEUTROS PCT: 86 %
Neutro Abs: 10.6 10*3/uL — ABNORMAL HIGH (ref 1.7–7.7)
PLATELETS: 160 10*3/uL (ref 150–400)
RBC: 3.75 MIL/uL — ABNORMAL LOW (ref 4.22–5.81)
RDW: 15.4 % (ref 11.5–15.5)
WBC: 12.2 10*3/uL — ABNORMAL HIGH (ref 4.0–10.5)
nRBC: 0 % (ref 0.0–0.2)

## 2018-02-08 LAB — PROTIME-INR
INR: 1.37
Prothrombin Time: 16.7 seconds — ABNORMAL HIGH (ref 11.4–15.2)

## 2018-02-08 LAB — HEMOGLOBIN A1C
Hgb A1c MFr Bld: 6.4 % — ABNORMAL HIGH (ref 4.8–5.6)
Mean Plasma Glucose: 136.98 mg/dL

## 2018-02-08 LAB — URINALYSIS, ROUTINE W REFLEX MICROSCOPIC
Bilirubin Urine: NEGATIVE
Glucose, UA: 50 mg/dL — AB
Ketones, ur: 20 mg/dL — AB
Leukocytes, UA: NEGATIVE
NITRITE: NEGATIVE
PH: 6 (ref 5.0–8.0)
Protein, ur: 30 mg/dL — AB
Specific Gravity, Urine: 1.023 (ref 1.005–1.030)

## 2018-02-08 LAB — COMPREHENSIVE METABOLIC PANEL
ALT: 26 U/L (ref 0–44)
AST: 47 U/L — ABNORMAL HIGH (ref 15–41)
Albumin: 3.9 g/dL (ref 3.5–5.0)
Alkaline Phosphatase: 54 U/L (ref 38–126)
Anion gap: 11 (ref 5–15)
BUN: 22 mg/dL (ref 8–23)
CO2: 23 mmol/L (ref 22–32)
Calcium: 8.6 mg/dL — ABNORMAL LOW (ref 8.9–10.3)
Chloride: 103 mmol/L (ref 98–111)
Creatinine, Ser: 1.27 mg/dL — ABNORMAL HIGH (ref 0.61–1.24)
GFR calc Af Amer: >60 mL/min (ref >60)
GFR calc non Af Amer: 53 mL/min — ABNORMAL LOW (ref >60)
Glucose, Bld: 198 mg/dL — ABNORMAL HIGH (ref 70–99)
Potassium: 3.4 mmol/L — ABNORMAL LOW (ref 3.5–5.1)
Sodium: 137 mmol/L (ref 135–145)
Total Bilirubin: 0.9 mg/dL (ref 0.3–1.2)
Total Protein: 7.2 g/dL (ref 6.5–8.1)

## 2018-02-08 LAB — GLUCOSE, CAPILLARY
Glucose-Capillary: 152 mg/dL — ABNORMAL HIGH (ref 70–99)
Glucose-Capillary: 255 mg/dL — ABNORMAL HIGH (ref 70–99)
Glucose-Capillary: 256 mg/dL — ABNORMAL HIGH (ref 70–99)

## 2018-02-08 LAB — INFLUENZA PANEL BY PCR (TYPE A & B)
Influenza A By PCR: NEGATIVE
Influenza B By PCR: NEGATIVE

## 2018-02-08 LAB — CG4 I-STAT (LACTIC ACID)
LACTIC ACID, VENOUS: 2.4 mmol/L — AB (ref 0.5–1.9)
Lactic Acid, Venous: 1.55 mmol/L (ref 0.5–1.9)

## 2018-02-08 LAB — TROPONIN I
Troponin I: 0.48 ng/mL (ref <0.03)
Troponin I: 0.67 ng/mL (ref <0.03)
Troponin I: 0.58 ng/mL (ref <0.03)

## 2018-02-08 LAB — PROCALCITONIN: Procalcitonin: 0.51 ng/mL

## 2018-02-08 LAB — FERRITIN: Ferritin: 36 ng/mL (ref 24–336)

## 2018-02-08 LAB — APTT: aPTT: 34 seconds (ref 24–36)

## 2018-02-08 MED ORDER — ENOXAPARIN SODIUM 40 MG/0.4ML ~~LOC~~ SOLN
40.0000 mg | SUBCUTANEOUS | Status: DC
  Administered 2018-02-09: 40 mg via SUBCUTANEOUS
  Filled 2018-02-08 (×2): qty 0.4

## 2018-02-08 MED ORDER — ONDANSETRON HCL 4 MG/2ML IJ SOLN: 4.0000 mg | Freq: Four times a day (QID) | INTRAMUSCULAR | Status: DC | PRN

## 2018-02-08 MED ORDER — SODIUM CHLORIDE 0.9 % IV SOLN
1.0000 g | INTRAVENOUS | Status: DC
  Administered 2018-02-08: 1 g via INTRAVENOUS
  Filled 2018-02-08 (×2): qty 10

## 2018-02-08 MED ORDER — HEPARIN BOLUS VIA INFUSION
4000.0000 [IU] | Freq: Once | INTRAVENOUS | Status: DC
  Filled 2018-02-08: qty 4000

## 2018-02-08 MED ORDER — SODIUM CHLORIDE 0.9 % IV BOLUS (SEPSIS): 1000.0000 mL | Freq: Once | INTRAVENOUS | Status: DC

## 2018-02-08 MED ORDER — VITAMIN C 500 MG PO TABS
1000.0000 mg | ORAL_TABLET | Freq: Every day | ORAL | Status: DC
  Administered 2018-02-09 – 2018-02-10 (×2): 1000 mg via ORAL
  Filled 2018-02-08 (×2): qty 2

## 2018-02-08 MED ORDER — POTASSIUM CHLORIDE CRYS ER 10 MEQ PO TBCR
10.0000 meq | EXTENDED_RELEASE_TABLET | Freq: Every day | ORAL | Status: DC
  Administered 2018-02-09 – 2018-02-10 (×2): 10 meq via ORAL
  Filled 2018-02-08 (×2): qty 1

## 2018-02-08 MED ORDER — ONDANSETRON HCL 4 MG PO TABS: 4.0000 mg | ORAL_TABLET | Freq: Four times a day (QID) | ORAL | Status: DC | PRN

## 2018-02-08 MED ORDER — ACETAMINOPHEN 325 MG PO TABS: 650.0000 mg | ORAL_TABLET | Freq: Four times a day (QID) | ORAL | Status: DC | PRN

## 2018-02-08 MED ORDER — METOPROLOL TARTRATE 25 MG PO TABS
12.5000 mg | ORAL_TABLET | Freq: Two times a day (BID) | ORAL | Status: DC
  Administered 2018-02-08: 12.5 mg via ORAL
  Filled 2018-02-08: qty 1

## 2018-02-08 MED ORDER — ASPIRIN EC 81 MG PO TBEC
81.0000 mg | DELAYED_RELEASE_TABLET | Freq: Every day | ORAL | Status: DC
  Filled 2018-02-08: qty 1

## 2018-02-08 MED ORDER — METOPROLOL TARTRATE 25 MG PO TABS
25.0000 mg | ORAL_TABLET | Freq: Two times a day (BID) | ORAL | Status: DC
  Administered 2018-02-09 – 2018-02-10 (×3): 25 mg via ORAL
  Filled 2018-02-08 (×3): qty 1

## 2018-02-08 MED ORDER — INSULIN ASPART 100 UNIT/ML ~~LOC~~ SOLN
0.0000 [IU] | Freq: Three times a day (TID) | SUBCUTANEOUS | Status: DC
  Administered 2018-02-09: 3 [IU] via SUBCUTANEOUS
  Administered 2018-02-09: 08:00:00 2 [IU] via SUBCUTANEOUS
  Administered 2018-02-09 – 2018-02-10 (×3): 3 [IU] via SUBCUTANEOUS
  Filled 2018-02-08 (×6): qty 1

## 2018-02-08 MED ORDER — IPRATROPIUM-ALBUTEROL 0.5-2.5 (3) MG/3ML IN SOLN
3.0000 mL | Freq: Four times a day (QID) | RESPIRATORY_TRACT | Status: DC
  Administered 2018-02-08 (×2): 3 mL via RESPIRATORY_TRACT
  Filled 2018-02-08 (×2): qty 3

## 2018-02-08 MED ORDER — VITAMIN D 25 MCG (1000 UNIT) PO TABS
2000.0000 [IU] | ORAL_TABLET | Freq: Every day | ORAL | Status: DC
  Administered 2018-02-09 – 2018-02-10 (×2): 2000 [IU] via ORAL
  Filled 2018-02-08 (×3): qty 2

## 2018-02-08 MED ORDER — ACETAMINOPHEN 650 MG RE SUPP: 650.0000 mg | Freq: Four times a day (QID) | RECTAL | Status: DC | PRN

## 2018-02-08 MED ORDER — METOPROLOL TARTRATE 25 MG PO TABS
25.0000 mg | ORAL_TABLET | Freq: Once | ORAL | Status: DC
  Filled 2018-02-08: qty 1

## 2018-02-08 MED ORDER — HEPARIN (PORCINE) 25000 UT/250ML-% IV SOLN
1100.0000 [IU]/h | INTRAVENOUS | Status: DC
  Filled 2018-02-08: qty 250

## 2018-02-08 MED ORDER — INSULIN ASPART 100 UNIT/ML ~~LOC~~ SOLN
0.0000 [IU] | Freq: Every day | SUBCUTANEOUS | Status: DC
  Administered 2018-02-08 – 2018-02-09 (×2): 3 [IU] via SUBCUTANEOUS
  Filled 2018-02-08 (×2): qty 1

## 2018-02-08 MED ORDER — ATORVASTATIN CALCIUM 20 MG PO TABS
40.0000 mg | ORAL_TABLET | Freq: Every day | ORAL | Status: DC
  Administered 2018-02-08 – 2018-02-09 (×2): 40 mg via ORAL
  Filled 2018-02-08 (×2): qty 2

## 2018-02-08 MED ORDER — SODIUM CHLORIDE 0.9 % IV BOLUS (SEPSIS)
1000.0000 mL | Freq: Once | INTRAVENOUS | Status: AC
  Administered 2018-02-08: 1000 mL via INTRAVENOUS

## 2018-02-08 MED ORDER — IOHEXOL 350 MG/ML SOLN
75.0000 mL | Freq: Once | INTRAVENOUS | Status: AC | PRN
  Administered 2018-02-08: 75 mL via INTRAVENOUS

## 2018-02-08 MED ORDER — PERFLUTREN LIPID MICROSPHERE
1.0000 mL | INTRAVENOUS | Status: AC | PRN
  Administered 2018-02-08: 2 mL via INTRAVENOUS
  Filled 2018-02-08: qty 10

## 2018-02-08 MED ORDER — ACETAMINOPHEN 500 MG PO TABS
500.0000 mg | ORAL_TABLET | Freq: Once | ORAL | Status: AC
  Administered 2018-02-08: 500 mg via ORAL

## 2018-02-08 MED ORDER — VITAMIN B-12 1000 MCG PO TABS
1000.0000 ug | ORAL_TABLET | Freq: Every day | ORAL | Status: DC
  Administered 2018-02-09 – 2018-02-10 (×2): 1000 ug via ORAL
  Filled 2018-02-08 (×3): qty 1

## 2018-02-08 MED ORDER — SODIUM CHLORIDE 0.9 % IV SOLN
INTRAVENOUS | Status: DC
  Administered 2018-02-08 – 2018-02-09 (×2): via INTRAVENOUS

## 2018-02-08 MED ORDER — ACETAMINOPHEN 500 MG PO TABS
1000.0000 mg | ORAL_TABLET | Freq: Once | ORAL | Status: DC
  Filled 2018-02-08: qty 2

## 2018-02-08 MED ORDER — METHYLPREDNISOLONE SODIUM SUCC 125 MG IJ SOLR
60.0000 mg | Freq: Two times a day (BID) | INTRAMUSCULAR | Status: DC
  Administered 2018-02-08 – 2018-02-09 (×2): 60 mg via INTRAVENOUS
  Filled 2018-02-08: qty 2

## 2018-02-08 MED ORDER — LEVOFLOXACIN IN D5W 500 MG/100ML IV SOLN
500.0000 mg | INTRAVENOUS | Status: DC
  Administered 2018-02-09: 500 mg via INTRAVENOUS
  Filled 2018-02-08 (×2): qty 100

## 2018-02-08 MED ORDER — DIPHENHYDRAMINE HCL 50 MG/ML IJ SOLN
12.5000 mg | Freq: Once | INTRAMUSCULAR | Status: DC
  Filled 2018-02-08: qty 0.25

## 2018-02-08 NOTE — Progress Notes (Signed)
ANTICOAGULATION CONSULT NOTE - Initial Consult  Pharmacy Consult for Heparin drip  Indication: chest pain/ACS  Allergies  Allergen Reactions  . Aspirin Other (See Comments)    bruising    Patient Measurements: Height: 5\' 11"  (180.3 cm) Weight: 211 lb (95.7 kg) IBW/kg (Calculated) : 75.3 Heparin Dosing Weight: 83.5 kg  Vital Signs: Temp: 102.8 F (39.3 C) (12/11 0911) Temp Source: Oral (12/11 0911) BP: 130/55 (12/11 1000) Pulse Rate: 99 (12/11 1000)  Labs: Recent Labs    02/07/18 1858 02/08/18 0913  HGB 9.0* 9.2*  HCT 29.7* 30.4*  PLT 174 160  CREATININE 1.30* 1.27*  TROPONINI <0.03 0.48*    Estimated Creatinine Clearance: 55.7 mL/min (A) (by C-G formula based on SCr of 1.27 mg/dL (H)).   Medical History: Past Medical History:  Diagnosis Date  . Anemia   . Anginal pain ([place])   . Aortic stenosis   . Cancer ([place])    bladder  . Cataracts, bilateral   . CHF (congestive heart failure) ([place])   . Chronic kidney disease   . Coronary artery disease   . Diabetes mellitus without complication ([place])    takes Janumet daily...dx approx. 2009  . Dyspnea   . Dysrhythmia   . Heart murmur   . History of colon polyps    benign  . Hyperlipidemia    takes Lovastatin daily  . Hypertension    takes Hyzaar and Amlodipine daily  . Joint pain   . Medical history non-contributory   . Muscle cramps    occasionally in legs   . Osteoarthritis   . Pre-diabetes   . Skin cancer of face   . Urinary urgency     Medications:  Scheduled:  . heparin  4,000 Units Intravenous Once  . lidocaine  1 application Urethral Once   Infusions:  . cefTRIAXone (ROCEPHIN)  IV Stopped (02/08/18 1040)  . heparin    . sodium chloride      Assessment: Pharmacy consulted to dose and monitor heparin drip for ACS/STEMI for this 77 yo male.   Goal of Therapy:  Heparin level 0.3-0.7 units/ml Monitor platelets by anticoagulation protocol: Yes   Plan:  Give 4000 units bolus x 1 Start  heparin infusion at 1100 units/hr Check anti-Xa level in 8 hours and daily while on heparin Continue to monitor H&H and platelets  Rory Montel K, RPH 02/08/2018,10:47 AM

## 2018-02-08 NOTE — ED Notes (Signed)
Tylenol order modified to 500 mg aas ems gave 640 mg en route

## 2018-02-08 NOTE — ED Provider Notes (Signed)
[facility] EMERGENCY DEPARTMENT Provider Note   CSN: [id number] Arrival date & time: 02/08/18  2297     History   Chief Complaint Chief Complaint  Patient presents with  . Weakness    HPI John [name] is a 77 y.o. male hx CKD, CAD, DM, HTN, here presenting with fever.  Patient was seen in the ED yesterday for weakness and was diagnosed with a possible UTI.  He was given a dose of Keflex and labs were unremarkable and sent home.  Patient states that he has diffuse weakness today and had a hard time getting out of bed.  He denies any productive cough or vomiting.  He does have some subjective chills though.  He called EMS and was noted to be febrile and hypoxic and tachycardic.  Patient denies any sick contacts.  Was given Tylenol prior to arrival.  The history is provided by the patient.    Past Medical History:  Diagnosis Date  . Anemia   . Anginal pain ([place])   . Aortic stenosis   . Cancer ([place])    bladder  . Cataracts, bilateral   . CHF (congestive heart failure) ([place])   . Chronic kidney disease   . Coronary artery disease   . Diabetes mellitus without complication ([place])    takes Janumet daily...dx approx. 2009  . Dyspnea   . Dysrhythmia   . Heart murmur   . History of colon polyps    benign  . Hyperlipidemia    takes Lovastatin daily  . Hypertension    takes Hyzaar and Amlodipine daily  . Joint pain   . Medical history non-contributory   . Muscle cramps    occasionally in legs   . Osteoarthritis   . Pre-diabetes   . Skin cancer of face   . Urinary urgency     Patient Active Problem List   Diagnosis Date Noted  . CKD (chronic kidney disease) stage 3, GFR 30-59 ml/min (HCC) 12/08/2017  . Insomnia 07/04/2017  . 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 Advance care planning 12/07/2016  . DNR (do not resuscitate) 12/07/2016  . Status post right knee replacement 02/06/2016  . Osteoarthritis of right knee 01/12/2016  . Aortic valve stenosis,  moderate 05/30/2015  . Bladder cancer ([place]) 11/15/2014  . Hyperlipidemia 11/15/2014  . ED (erectile dysfunction) 11/15/2014  . Localized osteoarthritis of right shoulder 11/15/2014  . Hypertension 11/15/2014  . Heart murmur 11/15/2014  . Type 2 diabetes mellitus with chronic kidney disease ([place]) 11/15/2014  . Anemia 11/15/2014    Past Surgical History:  Procedure Laterality Date  . AORTIC VALVE REPLACEMENT N/A 05/05/2017   Procedure: AORTIC VALVE REPLACEMENT (AVR) USING MAGNA EASE PERICARDIAL BIOPROSTHESIS - AORTIC VALVE MODEL 3300TFX, SIZE 23 MM;  Surgeon: Gaye Pollack, MD;  Location: [facility];  Service: Open Heart Surgery;  Laterality: N/A;  . BLADDER SURGERY    . CARDIAC VALVE REPLACEMENT    . COLONOSCOPY    . CORONARY ARTERY BYPASS GRAFT N/A 05/05/2017   Procedure: CORONARY ARTERY BYPASS GRAFTING (CABG) x 4 WITH ENDOSCOPIC HARVESTING OF RIGHT SAPHENOUS VEIN;  Surgeon: Gaye Pollack, MD;  Location: [facility];  Service: Open Heart Surgery;  Laterality: N/A;  . CYSTOURETHROSCOPY    . JOINT REPLACEMENT Left    hip  . KNEE ARTHROPLASTY Right 01/12/2016   Procedure: RIGHT  TOTAL KNEE ARTHROPLASTY WITH COMPUTER NAVIGATION;  Surgeon: Rod Can, MD;  Location: [facility];  Service: Orthopedics;  Laterality: Right;  Needs RNFA  . NO PAST SURGERIES    . RIGHT/LEFT HEART CATH AND CORONARY ANGIOGRAPHY N/A 04/21/2017   Procedure: RIGHT/LEFT HEART CATH AND CORONARY ANGIOGRAPHY;  Surgeon: Isaias Cowman, MD;  Location: [facility];  Service: Cardiovascular;  Laterality: N/A;  . SKIN SURGERY Left    arm  . TEE WITHOUT CARDIOVERSION N/A 05/05/2017   Procedure: TRANSESOPHAGEAL ECHOCARDIOGRAM (TEE);  Surgeon: Gaye Pollack, MD;  Location: [facility];  Service: Open Heart Surgery;  Laterality: N/A;        Home Medications    Prior to Admission medications   Medication Sig Start Date End Date Taking? Authorizing Provider  amLODipine (NORVASC) 5 MG tablet Take 5 mg by mouth daily. 11/22/12   Yes [provider]  Ascorbic Acid (VITAMIN C) 1000 MG tablet Take 1,000 mg by mouth daily.   Yes [provider]  aspirin 81 MG EC tablet Take 1 tablet (81 mg total) by mouth daily. 05/11/17  Yes Conte, Tessa N, PA-C  atorvastatin (LIPITOR) 40 MG tablet Take 1 tablet (40 mg total) by mouth daily at 6 PM. 05/10/17  Yes Harriet Pho, Tessa N, PA-C  cholecalciferol (VITAMIN D) 1000 units tablet Take 2,000 Units by mouth daily.   Yes [provider]  Cyanocobalamin (RA VITAMIN B-12 TR) 1000 MCG TBCR Take 1,000 mcg by mouth daily.    Yes [provider]  furosemide (LASIX) 40 MG tablet Take 1 tablet (40 mg total) by mouth daily. 05/16/17  Yes Bartle, Fernande Boyden, MD  KLOR-CON M10 10 MEQ tablet TAKE 1 TABLET BY MOUTH ONCE DAILY 01/03/18  Yes Cannady, Jolene T, NP  losartan (COZAAR) 50 MG tablet Take 50 mg by mouth daily.   Yes [provider]  metoprolol tartrate (LOPRESSOR) 25 MG tablet Take 1 tablet (25 mg total) by mouth 2 (two) times daily. 05/10/17  Yes Conte, Tessa N, PA-C  SitaGLIPtin-MetFORMIN HCl (JANUMET XR) 50-1000 MG TB24 Take 1 tablet by mouth 2 (two) times daily. 09/16/17  Yes Kathrine Haddock, NP  cephALEXin (KEFLEX) 500 MG capsule Take 1 capsule (500 mg total) by mouth 3 (three) times daily for 7 days. Patient not taking: Reported on 02/08/2018 02/07/18 02/14/18  Rudene Re, MD    Family History Family History  Problem Relation Age of Onset  . Dementia Mother   . Stroke Mother   . Stroke Father   . Diabetes Father   . Heart disease Father   . Cancer Sister   . Hypertension Brother   . Cancer Sister     Social History Social History   Tobacco Use  . Smoking status: Former Smoker    Years: 25.00    Last attempt to quit: 12/15/1980    Years since quitting: 37.1  . Smokeless tobacco: Never Used  Substance Use Topics  . Alcohol use: No    Alcohol/week: 0.0 standard drinks  . Drug use: No     Allergies   Aspirin   Review of  Systems Review of Systems  Constitutional: Positive for chills, fatigue and fever.  Neurological: Positive for weakness.  All other systems reviewed and are negative.    Physical Exam Updated Vital Signs BP (!) 160/72 (BP Location: Right Arm)   Pulse (!) 115   Temp (!) 102.8 F (39.3 C) (Oral)   Resp (!) 27   Ht 5\' 11"  (1.803 m)   Wt 95.7 kg   SpO2 (!) 88%   BMI 29.43 kg/m   Physical Exam  Constitutional: He  is oriented to person, place, and time.  Uncomfortable, chronically ill, dehydrated   HENT:  Head: Normocephalic.  MM dry   Eyes: Pupils are equal, round, and reactive to light. Conjunctivae and EOM are normal.  Neck: Normal range of motion. Neck supple.  Cardiovascular: Regular rhythm and normal heart sounds.  Tachycardic   Pulmonary/Chest:  Tachypneic, crackles L base   Abdominal: Soft. Bowel sounds are normal. He exhibits no distension. There is no tenderness.  Musculoskeletal: Normal range of motion.  Neurological: He is alert and oriented to person, place, and time. No cranial nerve deficit. Coordination normal.  Skin: Skin is warm.  Psychiatric: He has a normal mood and affect.  Nursing note and vitals reviewed.    ED Treatments / Results  Labs (all labs ordered are listed, but only abnormal results are displayed) Labs Reviewed  CBC WITH DIFFERENTIAL/PLATELET - Abnormal; Notable for the following components:      Result Value   WBC 12.2 (*)    RBC 3.75 (*)    Hemoglobin 9.2 (*)    HCT 30.4 (*)    MCH 24.5 (*)    Neutro Abs 10.6 (*)    Lymphs Abs 0.6 (*)    All other components within normal limits  CG4 I-STAT (LACTIC ACID) - Abnormal; Notable for the following components:   Lactic Acid, Venous 2.40 (*)    All other components within normal limits  CULTURE, BLOOD (ROUTINE X 2)  CULTURE, BLOOD (ROUTINE X 2)  URINE CULTURE  COMPREHENSIVE METABOLIC PANEL  URINALYSIS, ROUTINE W REFLEX MICROSCOPIC  TROPONIN I  I-STAT CG4 LACTIC ACID, ED  I-STAT  CG4 LACTIC ACID, ED    EKG None  Radiology Dg Chest Port 1 View  Result Date: 02/08/2018 CLINICAL DATA:  Shortness of breath and fever. Diagnosed with urinary tract infection yesterday but is not begun on antibiotics. History of CHF, diabetes, former smoker. EXAM: PORTABLE CHEST 1 VIEW COMPARISON:  PA and lateral chest x-ray of June 08, 2017 FINDINGS: The lungs are mildly hypoinflated especially on the left. There is no focal infiltrate nor pleural effusion. The heart and pulmonary vascularity are normal. The pulmonary vascularity is not engorged. There is calcification in the wall of the aortic arch. The patient has undergone previous CABG. IMPRESSION: Mild hypoinflation results in some vascular crowding. No pneumonia nor CHF. Electronically Signed   By: David  Martinique M.D.   On: 02/08/2018 09:46    Procedures Procedures (including critical care time)  CRITICAL CARE Performed by: Wandra Arthurs   Total critical care time: 30 minutes  Critical care time was exclusive of separately billable procedures and treating other patients.  Critical care was necessary to treat or prevent imminent or life-threatening deterioration.  Critical care was time spent personally by me on the following activities: development of treatment plan with patient and/or surrogate as well as nursing, discussions with consultants, evaluation of patient's response to treatment, examination of patient, obtaining history from patient or surrogate, ordering and performing treatments and interventions, ordering and review of laboratory studies, ordering and review of radiographic studies, pulse oximetry and re-evaluation of patient's condition.   Medications Ordered in ED Medications  sodium chloride 0.9 % bolus 1,000 mL (1,000 mLs Intravenous New Bag/Given 02/08/18 0937)    And  sodium chloride 0.9 % bolus 1,000 mL (has no administration in time range)    And  sodium chloride 0.9 % bolus 1,000 mL (1,000 mLs  Intravenous New Bag/Given 02/08/18 0920)  cefTRIAXone (ROCEPHIN) 1 g  in sodium chloride 0.9 % 100 mL IVPB (1 g Intravenous New Bag/Given 02/08/18 0932)  acetaminophen (TYLENOL) tablet 500 mg (500 mg Oral Given 02/08/18 0939)     Initial Impression / Assessment and Plan / ED Course  I have reviewed the triage vital signs and the nursing notes.  Pertinent labs & imaging results that were available during my care of the patient were reviewed by me and considered in my medical decision making (see chart for details).    RAYDER SULLENGER is a 77 y.o. male here with fever, hypoxia, weakness. Consider pneumonia vs flu vs UTI. I am concerned that patient is septic. Code sepsis initiated. Since he was diagnosed with UTI yesterday, I think that is the likely source. Will get labs, lactate, cultures, UA, CXR. Will give 30 cc/kg bolus, rocephin empirically. Will need admission.   10:46 AM Lactate 2.4. WBC 12. CXR clear but patient persistently hypoxic so CTA performed and showed no pulmonary infarct or pneumonia. UA yesterday showed + leuk and 21-50 WBC. Ordered rocephin. HR down to 90s after IVF. Patient's trop is positive at 0.48. EKG showed no STEMI. He has known CAD and is hypoxic so I think likely demand ischemia. Patient is allergic to ASA and has no active chest pain. Will give heparin for now and trend troponin inpatient. Hospitalist to admit for sepsis from UTI, elevated trop likely demand ischemia.    Final Clinical Impressions(s) / ED Diagnoses   Final diagnoses:  None    ED Discharge Orders    None       Drenda Freeze, MD 02/08/18 1052

## 2018-02-08 NOTE — Progress Notes (Signed)
Patient ID: John [name], male   DOB: May 04, 1940, 77 y.o.   MRN: [id number]  ACP note  Patient, wife and daughter at the bedside  Diagnosis: Clinical sepsis, acute cystitis, borderline elevated troponin, chronic kidney disease stage III, chronic anemia, history of bladder cancer, type 2 diabetes mellitus, history of chronic diastolic congestive heart failure, weakness and acute encephalopathy  CODE STATUS discussed and patient is a DNR.  Plan.  IV fluid hydration IV antibiotics follow-up cultures and continue to monitor.  Serial cardiac enzymes.  Time spent on ACP discussion 17 minutes Dr. Loletha Grayer

## 2018-02-08 NOTE — ED Triage Notes (Signed)
Pt via ems from home with fever, weakness. Pt seen here yesterday and diagnosed with UTI, has not had prescription filled. Pt arrives with O2 [place] at 3L because he was at 89% upon EMS arrival. Pt alert, answers questions appropriately; wife states he has been altered this morning.

## 2018-02-08 NOTE — H&P (Signed)
John [name]    MR#:  [id number]  DATE OF BIRTH:  1940-09-10  DATE OF ADMISSION:  02/08/2018  PRIMARY CARE PHYSICIAN: Venita Lick, NP   REQUESTING/REFERRING PHYSICIAN: Dr Shirlyn Goltz  CHIEF COMPLAINT:   Chief Complaint  Patient presents with  . Weakness    HISTORY OF PRESENT ILLNESS:  John [name]  is a 77 y.o. male with a known history of bladder cancer with recent cystoscopy.  Today brought him into the emergency room last night because he was staggering around and staring at the floor.  They diagnosed him with a urinary tract infection and sent him home.  Last night he could not get to the bathroom and back into the bed without a lot of help.  And this morning they could not get him up.  At home had a fever of 103 and this morning in the ER 102.  Urinalysis from last night was positive.  This morning he did have some shortness of breath.  Yesterday some nausea vomiting.  No chest pain. Hospitalist services were contacted for further evaluation for sepsis.  PAST MEDICAL HISTORY:   Past Medical History:  Diagnosis Date  . Anemia   . Anginal pain ([place])   . Aortic stenosis   . Cancer ([place])    bladder  . Cataracts, bilateral   . CHF (congestive heart failure) ([place])   . Chronic kidney disease   . Coronary artery disease   . Diabetes mellitus without complication ([place])    takes Janumet daily...dx approx. 2009  . Dyspnea   . Dysrhythmia   . Heart murmur   . History of colon polyps    benign  . Hyperlipidemia    takes Lovastatin daily  . Hypertension    takes Hyzaar and Amlodipine daily  . Joint pain   . Medical history non-contributory   . Muscle cramps    occasionally in legs   . Osteoarthritis   . Pre-diabetes   . Skin cancer of face   . Urinary urgency     PAST SURGICAL HISTORY:   Past Surgical History:  Procedure Laterality Date  . AORTIC VALVE REPLACEMENT N/A 05/05/2017    Procedure: AORTIC VALVE REPLACEMENT (AVR) USING MAGNA EASE PERICARDIAL BIOPROSTHESIS - AORTIC VALVE MODEL 3300TFX, SIZE 23 MM;  Surgeon: Gaye Pollack, MD;  Location: [place];  Service: Open Heart Surgery;  Laterality: N/A;  . BLADDER SURGERY    . CARDIAC VALVE REPLACEMENT    . COLONOSCOPY    . CORONARY ARTERY BYPASS GRAFT N/A 05/05/2017   Procedure: CORONARY ARTERY BYPASS GRAFTING (CABG) x 4 WITH ENDOSCOPIC HARVESTING OF RIGHT SAPHENOUS VEIN;  Surgeon: Gaye Pollack, MD;  Location: [facility];  Service: Open Heart Surgery;  Laterality: N/A;  . CYSTOURETHROSCOPY    . JOINT REPLACEMENT Left    hip  . KNEE ARTHROPLASTY Right 01/12/2016   Procedure: RIGHT  TOTAL KNEE ARTHROPLASTY WITH COMPUTER NAVIGATION;  Surgeon: Rod Can, MD;  Location: [place] [place];  Service: Orthopedics;  Laterality: Right;  Needs RNFA  . NO PAST SURGERIES    . RIGHT/LEFT HEART CATH AND CORONARY ANGIOGRAPHY N/A 04/21/2017   Procedure: RIGHT/LEFT HEART CATH AND CORONARY ANGIOGRAPHY;  Surgeon: Isaias Cowman, MD;  Location: [facility];  Service: Cardiovascular;  Laterality: N/A;  . SKIN SURGERY Left    arm  . TEE WITHOUT CARDIOVERSION N/A 05/05/2017   Procedure: TRANSESOPHAGEAL ECHOCARDIOGRAM (TEE);  Surgeon: Gilford Raid  K, MD;  Location: [place];  Service: Open Heart Surgery;  Laterality: N/A;    SOCIAL HISTORY:   Social History   Tobacco Use  . Smoking status: Former Smoker    Years: 25.00    Last attempt to quit: 12/15/1980    Years since quitting: 37.1  . Smokeless tobacco: Never Used  Substance Use Topics  . Alcohol use: No    Alcohol/week: 0.0 standard drinks    FAMILY HISTORY:   Family History  Problem Relation Age of Onset  . Dementia Mother   . Stroke Mother   . Stroke Father   . Diabetes Father   . Heart disease Father   . Cancer Sister   . Hypertension Brother   . Cancer Sister     DRUG ALLERGIES:   Allergies  Allergen Reactions  . Aspirin Other (See Comments)    bruising     REVIEW OF SYSTEMS:  CONSTITUTIONAL: Positive fever, cold feeling.  Positive for no energy. EYES: No blurred or double vision.  Wears glasses. EARS, NOSE, AND THROAT: No tinnitus or ear pain. No sore throat.  Decreased hearing.  Positive for runny nose in the mornings. RESPIRATORY: No cough.  Positive shortness of breath this morning.  No wheezing or hemoptysis.  CARDIOVASCULAR: No chest pain, orthopnea.  GASTROINTESTINAL: Positive nausea, vomiting last night.  No diarrhea or abdominal pain. No blood in bowel movements GENITOURINARY: No dysuria, hematuria.  ENDOCRINE: No polyuria, nocturia,  HEMATOLOGY: History of anemia.  No easy bruising or bleeding SKIN: No rash or lesion. MUSCULOSKELETAL: Some NEUROLOGIC: No tingling, numbness, weakness.  PSYCHIATRY: No anxiety or depression.   MEDICATIONS AT HOME:   Prior to Admission medications   Medication Sig Start Date End Date Taking? Authorizing Provider  amLODipine (NORVASC) 5 MG tablet Take 5 mg by mouth daily. 11/22/12  Yes [provider]  Ascorbic Acid (VITAMIN C) 1000 MG tablet Take 1,000 mg by mouth daily.   Yes [provider]  aspirin 81 MG EC tablet Take 1 tablet (81 mg total) by mouth daily. 05/11/17  Yes Conte, Tessa N, PA-C  atorvastatin (LIPITOR) 40 MG tablet Take 1 tablet (40 mg total) by mouth daily at 6 PM. 05/10/17  Yes Harriet Pho, Tessa N, PA-C  cholecalciferol (VITAMIN D) 1000 units tablet Take 2,000 Units by mouth daily.   Yes [provider]  Cyanocobalamin (RA VITAMIN B-12 TR) 1000 MCG TBCR Take 1,000 mcg by mouth daily.    Yes [provider]  furosemide (LASIX) 40 MG tablet Take 1 tablet (40 mg total) by mouth daily. 05/16/17  Yes Bartle, Fernande Boyden, MD  KLOR-CON M10 10 MEQ tablet TAKE 1 TABLET BY MOUTH ONCE DAILY 01/03/18  Yes Cannady, Jolene T, NP  losartan (COZAAR) 50 MG tablet Take 50 mg by mouth daily.   Yes [provider]  metoprolol tartrate (LOPRESSOR) 25 MG tablet Take 1  tablet (25 mg total) by mouth 2 (two) times daily. 05/10/17  Yes Conte, Tessa N, PA-C  SitaGLIPtin-MetFORMIN HCl (JANUMET XR) 50-1000 MG TB24 Take 1 tablet by mouth 2 (two) times daily. 09/16/17  Yes Kathrine Haddock, NP  cephALEXin (KEFLEX) 500 MG capsule Take 1 capsule (500 mg total) by mouth 3 (three) times daily for 7 days. Patient not taking: Reported on 02/08/2018 02/07/18 02/14/18  Rudene Re, MD      VITAL SIGNS:  Blood pressure (!) 116/57, pulse 89, temperature 98.7 F (37.1 C), temperature source Oral, resp. rate 20, height 5\' 11"  (1.803 m),  weight 95.7 kg, SpO2 97 %.  PHYSICAL EXAMINATION:  GENERAL:  77 y.o.-year-old patient lying in the bed with no acute distress.  EYES: Pupils equal, round, reactive to light and accommodation. No scleral icterus. Extraocular muscles intact.  HEENT: Head atraumatic, normocephalic. Oropharynx and nasopharynx clear.  NECK:  Supple, no jugular venous distention. No thyroid enlargement, no tenderness.  LUNGS: Normal breath sounds bilaterally, no wheezing, rales,rhonchi or crepitation. No use of accessory muscles of respiration.  CARDIOVASCULAR: S1, S2 normal. No murmurs, rubs, or gallops.  ABDOMEN: Soft, nontender, nondistended. Bowel sounds present. No organomegaly or mass.  EXTREMITIES: 3+ pedal edema, no cyanosis, or clubbing.  NEUROLOGIC: Cranial nerves II through XII are intact. Muscle strength 5/5 in all extremities. Sensation intact. Gait not checked.  PSYCHIATRIC: The patient is alert and answers some questions.  The patient referred to his family for most of the history.  SKIN: No rash, lesion, or ulcer.   LABORATORY PANEL:   CBC Recent Labs  Lab 02/08/18 0913  WBC 12.2*  HGB 9.2*  HCT 30.4*  PLT 160   ------------------------------------------------------------------------------------------------------------------  Chemistries  Recent Labs  Lab 02/08/18 0913  NA 137  K 3.4*  CL 103  CO2 23  GLUCOSE 198*  BUN 22   CREATININE 1.27*  CALCIUM 8.6*  AST 47*  ALT 26  ALKPHOS 54  BILITOT 0.9   ------------------------------------------------------------------------------------------------------------------  Cardiac Enzymes Recent Labs  Lab 02/08/18 0913  TROPONINI 0.48*   ------------------------------------------------------------------------------------------------------------------  RADIOLOGY:  Ct Angio Chest Pe W And/or Wo Contrast  Result Date: 02/08/2018 CLINICAL DATA:  Short of breath. Suspect pulmonary embolism. History of bladder cancer hypertension diabetes EXAM: CT ANGIOGRAPHY CHEST WITH CONTRAST TECHNIQUE: Multidetector CT imaging of the chest was performed using the standard protocol during bolus administration of intravenous contrast. Multiplanar CT image reconstructions and MIPs were obtained to evaluate the vascular anatomy. CONTRAST:  29mL OMNIPAQUE IOHEXOL 350 MG/ML SOLN COMPARISON:  Chest 02/08/2018 FINDINGS: Cardiovascular: Negative for pulmonary embolism. Atherosclerotic aorta. Prior CABG with coronary calcification. Heart size normal. No pericardial effusion. Mediastinum/Nodes: Subcarinal calcified lymph node appears benign. No mass lesion in the mediastinum. Lungs/Pleura: Negative for infiltrate effusion or mass. Lungs well aerated. Minimal dependent atelectasis in the lung bases. Upper Abdomen: Calcified gallstone.  No mass lesion. Musculoskeletal: Advanced degenerative change in the shoulder bilaterally. Degenerative change in spurring in the thoracic spine without fracture. Review of the MIP images confirms the above findings. IMPRESSION: 1. Negative for pulmonary embolism. No acute abnormality in the chest. 2. Calcified gallstone Aortic Atherosclerosis (ICD10-I70.0). Electronically Signed   By: Franchot Gallo M.D.   On: 02/08/2018 10:35   Dg Chest Port 1 View  Result Date: 02/08/2018 CLINICAL DATA:  Shortness of breath and fever. Diagnosed with urinary tract infection yesterday  but is not begun on antibiotics. History of CHF, diabetes, former smoker. EXAM: PORTABLE CHEST 1 VIEW COMPARISON:  PA and lateral chest x-ray of June 08, 2017 FINDINGS: The lungs are mildly hypoinflated especially on the left. There is no focal infiltrate nor pleural effusion. The heart and pulmonary vascularity are normal. The pulmonary vascularity is not engorged. There is calcification in the wall of the aortic arch. The patient has undergone previous CABG. IMPRESSION: Mild hypoinflation results in some vascular crowding. No pneumonia nor CHF. Electronically Signed   By: David  Martinique M.D.   On: 02/08/2018 09:46    EKG:   Sinus tachycardia 116 bpm, left bundle branch block.  IMPRESSION AND PLAN:   1.  Clinical sepsis.  Likely  from acute cystitis with recent cystoscopy.  Started on Rocephin.  Follow-up blood and urine cultures.  We will also send off procalcitonin and influenza swab.  IV fluid hydration. 2.  Borderline elevated troponin likely secondary to sepsis and demand ischemia.  Serial troponins.  Monitor on telemetry.  Continue aspirin and metoprolol.  Hold off on heparin drip at this time. 3.  Chronic kidney disease stage III.  Monitor with IV fluids 4.  Chronic anemia.  Add on a ferritin. 5.  History of bladder cancer 6.  Type 2 diabetes mellitus put on sliding scale and hold oral medications 7.  History of chronic diastolic congestive heart failure.  No current signs.  Monitor closely with IV fluids. 8.  Weakness and acute encephalopathy likely secondary to sepsis.  Physical therapy evaluation.   All the records are reviewed and case discussed with ED provider. Management plans discussed with the patient, family and they are in agreement.  CODE STATUS: DNR  TOTAL TIME TAKING CARE OF THIS PATIENT: 50 minutes, including acp time.    Loletha Grayer M.D on 02/08/2018 at 11:16 AM  Between 7am to 6pm - Pager - [phone]  After 6pm call admission pager [phone]  [facility]  [phone]  CC: Primary care physician; Venita Lick, NP

## 2018-02-08 NOTE — Progress Notes (Signed)
Patient ID: John [name], male   DOB: Aug 25, 1940, 77 y.o.   MRN: [id number]  Called secondary to stridor.  Patient using accessory muscles to breathe.  Upper airway stridor.  Foley placed and stridor seemed to get a little bit better.  Cxr, solumedrol and benadryl ordered Rocephin changed to levaquin  Watch a little longer on floor prior to making a decision about transfer to step down.  Dr Leslye Peer

## 2018-02-08 NOTE — Progress Notes (Addendum)
Wife states patient has history of UTI with cystoscopy in the past. Most recent cystoscopy was last Friday. Patient with recurrent bladder cancer that has not metastasized at this time. Plan is for surgery at a later time after the first of the year. Patient is typically alert and oriented and drives himself to and from appointments. Patient also with trouble urinating in ED where I/O cath was conducted. Will continue to monitor urinary output. Patient s/p open heart surgery with valve replacement and 4 vessel blockage repair in March of 2019.

## 2018-02-08 NOTE — Progress Notes (Signed)
PT Cancellation Note  Patient Details Name: John [name] MRN: [id number] DOB: 07-28-1940   Cancelled Treatment:     Chart reviewed, spoke with RN. Patient troponin trending up at this time. Patient to be held until tomorrow.    Joseph Johns 02/08/2018, 2:55 PM

## 2018-02-08 NOTE — Progress Notes (Signed)
CODE SEPSIS - PHARMACY COMMUNICATION  **Broad Spectrum Antibiotics should be administered within 1 hour of Sepsis diagnosis**  Time Code Sepsis Called/Page Received: @ [phone]  Antibiotics Ordered: ceftriaxone 1g  Time of 1st antibiotic administration: @ 0932  Additional action taken by pharmacy: N/A  If necessary, Name of Provider/Nurse Contacted: N/A  Pernell Dupre, PharmD, BCPS Clinical Pharmacist 02/08/2018 9:39 AM

## 2018-02-09 LAB — CBC
HCT: 27.5 % — ABNORMAL LOW (ref 39.0–52.0)
Hemoglobin: 8.1 g/dL — ABNORMAL LOW (ref 13.0–17.0)
MCH: 24.8 pg — ABNORMAL LOW (ref 26.0–34.0)
MCHC: 29.5 g/dL — ABNORMAL LOW (ref 30.0–36.0)
MCV: 84.4 fL (ref 80.0–100.0)
NRBC: 0 % (ref 0.0–0.2)
Platelets: 124 10*3/uL — ABNORMAL LOW (ref 150–400)
RBC: 3.26 MIL/uL — ABNORMAL LOW (ref 4.22–5.81)
RDW: 15.4 % (ref 11.5–15.5)
WBC: 11.5 10*3/uL — ABNORMAL HIGH (ref 4.0–10.5)

## 2018-02-09 LAB — GLUCOSE, CAPILLARY
GLUCOSE-CAPILLARY: 187 mg/dL — AB (ref 70–99)
Glucose-Capillary: 213 mg/dL — ABNORMAL HIGH (ref 70–99)
Glucose-Capillary: 207 mg/dL — ABNORMAL HIGH (ref 70–99)
Glucose-Capillary: 283 mg/dL — ABNORMAL HIGH (ref 70–99)

## 2018-02-09 LAB — BASIC METABOLIC PANEL
Anion gap: 5 (ref 5–15)
BUN: 25 mg/dL — AB (ref 8–23)
CO2: 25 mmol/L (ref 22–32)
Calcium: 7.9 mg/dL — ABNORMAL LOW (ref 8.9–10.3)
Chloride: 109 mmol/L (ref 98–111)
Creatinine, Ser: 1.23 mg/dL (ref 0.61–1.24)
GFR calc Af Amer: >60 mL/min (ref >60)
GFR calc non Af Amer: 55 mL/min — ABNORMAL LOW (ref >60)
Glucose, Bld: 226 mg/dL — ABNORMAL HIGH (ref 70–99)
Potassium: 3.8 mmol/L (ref 3.5–5.1)
Sodium: 139 mmol/L (ref 135–145)

## 2018-02-09 LAB — ECHOCARDIOGRAM COMPLETE
Height: 71 in
Weight: 3376 oz

## 2018-02-09 LAB — LIPID PANEL
CHOLESTEROL: 90 mg/dL (ref 0–200)
HDL: 34 mg/dL — ABNORMAL LOW (ref >40)
LDL Cholesterol: 43 mg/dL (ref 0–99)
TRIGLYCERIDES: 67 mg/dL (ref <150)
Total CHOL/HDL Ratio: 2.6 RATIO
VLDL: 13 mg/dL (ref 0–40)

## 2018-02-09 LAB — PROCALCITONIN: Procalcitonin: 1.7 ng/mL

## 2018-02-09 MED ORDER — IPRATROPIUM-ALBUTEROL 0.5-2.5 (3) MG/3ML IN SOLN
3.0000 mL | Freq: Three times a day (TID) | RESPIRATORY_TRACT | Status: DC
  Administered 2018-02-09 (×2): 3 mL via RESPIRATORY_TRACT
  Filled 2018-02-09 (×2): qty 3

## 2018-02-09 MED ORDER — METHYLPREDNISOLONE SODIUM SUCC 40 MG IJ SOLR
40.0000 mg | Freq: Two times a day (BID) | INTRAMUSCULAR | Status: DC
  Administered 2018-02-09 – 2018-02-10 (×2): 40 mg via INTRAVENOUS
  Filled 2018-02-09 (×2): qty 1

## 2018-02-09 MED ORDER — INSULIN ASPART 100 UNIT/ML ~~LOC~~ SOLN
3.0000 [IU] | Freq: Three times a day (TID) | SUBCUTANEOUS | Status: DC
  Administered 2018-02-09 – 2018-02-10 (×2): 3 [IU] via SUBCUTANEOUS
  Filled 2018-02-09 (×4): qty 1

## 2018-02-09 MED ORDER — IPRATROPIUM-ALBUTEROL 0.5-2.5 (3) MG/3ML IN SOLN: 3.0000 mL | Freq: Four times a day (QID) | RESPIRATORY_TRACT | Status: DC | PRN

## 2018-02-09 NOTE — Progress Notes (Signed)
Inpatient Diabetes Program Recommendations  AACE/ADA: New Consensus Statement on Inpatient Glycemic Control (2019)  Target Ranges:  Prepandial:   less than 140 mg/dL      Peak postprandial:   less than 180 mg/dL (1-2 hours)      Critically ill patients:  140 - 180 mg/dL   Results for CHAISE, John [name] (MRN [id number]) as of 02/09/2018 10:40  Ref. Range 02/08/2018 13:22 02/08/2018 18:42 02/08/2018 21:28 02/09/2018 07:55  Glucose-Capillary Latest Ref Range: 70 - 99 mg/dL 152 (H) 255 (H) 256 (H) 187 (H)   Review of Glycemic Control  Diabetes history: DM2 Outpatient Diabetes medications: Janumet 50-1000 mg BID Current orders for Inpatient glycemic control: Novolog 0-9 units TID with meals, Novolog 0-5 units QHS; Solumedrol 60 mg Q12H  Inpatient Diabetes Program Recommendations:  Insulin - Meal Coverage: If steroids are continued and post prandial glucose is consistently greater than 180 mg/dl, please consider ordering Novolog 3 units TID with meals for meal coverage if patient eats at least 50% of meals.  Thanks, Barnie Alderman, RN, MSN, CDE Diabetes Coordinator Inpatient Diabetes Program [phone] (Team Pager from 8am to 5pm)

## 2018-02-09 NOTE — Care Management Note (Signed)
Case Management Note  Patient Details  Name: JAYSTIN MCGARVEY MRN: [id number] Date of Birth: 30-Nov-1940  Subjective/Objective:   Admitted to [facility] with the diagnosis of sepsis. Spouse is Paulette [phone]). Last seen Dr. Ned Card at [facility] 12/08/17. Prescriptions are filled at [facility] on [address].  No home health. No skilled facility. Outpatient physical therapy at this facility 2017. No home oxygen. Rolling walker and one crutch in the home, if needed. Takes care of all basic activities of daily living himself, drives. Truck driver x 49 years. Last fall was 2018. Good appetite,.  History of bladder cancer. Foley in place. Acute oxygen in place.  Family will transport.                  Action/Plan: Will continue to follow for transition of care, if needed   Expected Discharge Date:                  Expected Discharge Plan:     In-House Referral:   yes  Discharge planning Services   yes  Post Acute Care Choice:    Choice offered to:     DME Arranged:    DME Agency:     HH Arranged:    [place] Agency:     Status of Service:     If discussed at [employer] of Stay Meetings, dates discussed:    Additional Comments:  Shelbie Ammons, RN MSN CCM Care Management [phone] 02/09/2018, 8:35 AM

## 2018-02-09 NOTE — Evaluation (Signed)
Physical Therapy Evaluation Patient Details Name: John [name] MRN: [id number] DOB: March 02, 1940 Today's Date: 02/09/2018   History of Present Illness  Pt is a 77 y.o. male with a known history of bladder cancer with recent cystoscopy.  Pt brought into the emergency room because he was staggering around and staring at the floor.  They diagnosed him with a urinary tract infection and sent him home.  That night he could not get to the bathroom and back into the bed without a lot of help.  And the next morning they could not get him up with pt brought back to the ED.  Temp in the ER 102.  Urinalysis was positive with some shortness of breath. No chest pain. Hospitalist services were contacted for further evaluation for sepsis.  Assessment includes: Acute metabolic encephalopathy secondary to Clinical sepsis, Borderline elevated troponin likely secondary to sepsis and demand ischemia, CKD III, chronic anemia, bladder CA, DM II, CHF, and weakness.     Clinical Impression  Pt presents with minor deficits in strength, gait, balance, and activity tolerance but overall performed very well during the session.  Pt's SpO2 on 1LO2/min was 95-96% with HR 87 bpm.  Spoke to nursing who gave ok for trial of activity on room air.  After supine/seated therex pt's SpO2 on room air was 95% with HR in the low 90s and no adverse symptoms.  After amb 50' with a RW and SBA pt's SpO2 94% with HR 107 bpm.  After amb 100' without an AD on room air pt's SpO2 was 95% with HR 107 bpm.  Pt's HR decreased back to the low 90s quickly upon returning to sitting with no adverse symptoms noted throughout the session.  Pt left on room air with nursing notified and in agreement.  Pt did present with a minor limp during amb without an AD with noted decrease in LLE stance time that the pt reported is chronic from an previous L THR with pt following up with orthopedics for assessment.  Pt reports feeling like he is close to his baseline physically  with only minor weakness compared to prior to admission.  Pt will benefit to remain on PT caseload while in acute care to prevent a decline in function but will not require continued PT services upon discharge.      Follow Up Recommendations No PT follow up    Equipment Recommendations  None recommended by PT    Recommendations for Other Services       Precautions / Restrictions Precautions Precautions: Fall Restrictions Weight Bearing Restrictions: No      Mobility  Bed Mobility Overal bed mobility: Independent                Transfers Overall transfer level: Independent                  Ambulation/Gait Ambulation/Gait assistance: Supervision Gait Distance (Feet): 100 Feet Assistive device: None Gait Pattern/deviations: Step-through pattern;Decreased step length - right;Decreased stance time - left Gait velocity: Decreased   General Gait Details: Pt amb 1 x 50' with a RW and 1 x 100' without an AD with min instability associated with mild limp with decreased LLE stance time pt states is from a L THA that is now problematic with ortho following  Stairs            Wheelchair Mobility    Modified Rankin (Stroke Patients Only)       Balance Overall balance assessment: Mild deficits observed,  not formally tested                                           Pertinent Vitals/Pain Pain Assessment: No/denies pain    Home Living Family/patient expects to be discharged to:: Private residence Living Arrangements: Spouse/significant other Available Help at Discharge: Family;Available 24 hours/day Type of Home: House Home Access: Stairs to enter   [employer] of Steps: 1 small threshold, 1-2" high Home Layout: One level Home Equipment: Walker - 2 wheels      Prior Function Level of Independence: Independent         Comments: Ind amb limited community distances without an AD, no fall history, Ind with ADLs, goes to  gym 2x/wk for the last 6 months for strength and cardio     Hand Dominance        Extremity/Trunk Assessment   Upper Extremity Assessment Upper Extremity Assessment: Overall WFL for tasks assessed    Lower Extremity Assessment Lower Extremity Assessment: Overall WFL for tasks assessed       Communication   Communication: No difficulties  Cognition Arousal/Alertness: Awake/alert Behavior During Therapy: WFL for tasks assessed/performed Overall Cognitive Status: Within Functional Limits for tasks assessed                                        General Comments      Exercises Total Joint Exercises Ankle Circles/Pumps: AROM;Both;10 reps Quad Sets: Strengthening;Both;10 reps Towel Squeeze: Strengthening;Both;10 reps Hip ABduction/ADduction: AROM;Both;10 reps Long Arc Quad: AROM;Both;10 reps Knee Flexion: AROM;Both;10 reps Marching in Standing: AROM;Both;10 reps;Seated;Standing   Assessment/Plan    PT Assessment Patient needs continued PT services  PT Problem List Decreased strength;Decreased activity tolerance;Decreased balance       PT Treatment Interventions DME instruction;Gait training;Stair training;Functional mobility training;Therapeutic activities;Therapeutic exercise;Balance training;Patient/family education    PT Goals (Current goals can be found in the Care Plan section)  Acute Rehab PT Goals Patient Stated Goal: To return home PT Goal Formulation: With patient Time For Goal Achievement: 02/22/18 Potential to Achieve Goals: Good    Frequency Min 2X/week   Barriers to discharge        Co-evaluation               AM-PAC PT "6 Clicks" Mobility  Outcome Measure Help needed turning from your back to your side while in a flat bed without using bedrails?: None Help needed moving from lying on your back to sitting on the side of a flat bed without using bedrails?: None Help needed moving to and from a bed to a chair (including a  wheelchair)?: None Help needed standing up from a chair using your arms (e.g., wheelchair or bedside chair)?: None Help needed to walk in hospital room?: A Little Help needed climbing 3-5 steps with a railing? : A Little 6 Click Score: 22    End of Session Equipment Utilized During Treatment: Gait belt Activity Tolerance: Patient tolerated treatment well Patient left: in bed;with call bell/phone within reach;with bed alarm set Nurse Communication: Mobility status;Other (comment)(Pt response to activity on room air) PT Visit Diagnosis: Difficulty in walking, not elsewhere classified (R26.2)    Time: 1194-1740 PT Time Calculation (min) (ACUTE ONLY): 38 min   Charges:   PT Evaluation $PT Eval Low Complexity: 1  Low PT Treatments $Therapeutic Exercise: 8-22 mins        D. Royetta Asal PT, DPT 02/09/18, 2:52 PM

## 2018-02-09 NOTE — Progress Notes (Signed)
[facility] [place] NAME: John [name]    MR#:  [id number]  DATE OF BIRTH:  01/02/1941  SUBJECTIVE:  CHIEF COMPLAINT: Patient is feeling much better today.  Shortness of breath significantly improved.  Wife and sister-in-law at bedside.  REVIEW OF SYSTEMS:  CONSTITUTIONAL: No fever, fatigue or weakness.  EYES: No blurred or double vision.  EARS, NOSE, AND THROAT: No tinnitus or ear pain.  RESPIRATORY: No cough, shortness of breath, wheezing or hemoptysis.  CARDIOVASCULAR: No chest pain, orthopnea, edema.  GASTROINTESTINAL: No nausea, vomiting, diarrhea or abdominal pain.  GENITOURINARY: No dysuria, hematuria.  ENDOCRINE: No polyuria, nocturia,  HEMATOLOGY: No anemia, easy bruising or bleeding SKIN: No rash or lesion. MUSCULOSKELETAL: No joint pain or arthritis.   NEUROLOGIC: No tingling, numbness, weakness.  PSYCHIATRY: No anxiety or depression.   DRUG ALLERGIES:   Allergies  Allergen Reactions  . Aspirin Other (See Comments)    bruising    VITALS:  Blood pressure 126/67, pulse 93, temperature 98 F (36.7 C), temperature source Oral, resp. rate 18, height 5\' 11"  (1.803 m), weight 95.7 kg, SpO2 95 %.  PHYSICAL EXAMINATION:  GENERAL:  77 y.o.-year-old patient lying in the bed with no acute distress.  EYES: Pupils equal, round, reactive to light and accommodation. No scleral icterus. Extraocular muscles intact.  HEENT: Head atraumatic, normocephalic. Oropharynx and nasopharynx clear.  NECK:  Supple, no jugular venous distention. No thyroid enlargement, no tenderness.  LUNGS: Normal breath sounds bilaterally, no wheezing, rales,rhonchi or crepitation. No use of accessory muscles of respiration.  CARDIOVASCULAR: S1, S2 normal. No murmurs, rubs, or gallops.  ABDOMEN: Soft, nontender, nondistended. Bowel sounds present.  EXTREMITIES: No pedal edema, cyanosis, or clubbing.  NEUROLOGIC: Awake, alert and oriented x3 sensation  intact. Gait not checked.  PSYCHIATRIC: The patient is alert and oriented x 3.  SKIN: No obvious rash, lesion, or ulcer.    LABORATORY PANEL:   CBC Recent Labs  Lab 02/09/18 0512  WBC 11.5*  HGB 8.1*  HCT 27.5*  PLT 124*   ------------------------------------------------------------------------------------------------------------------  Chemistries  Recent Labs  Lab 02/08/18 0913 02/09/18 0512  NA 137 139  K 3.4* 3.8  CL 103 109  CO2 23 25  GLUCOSE 198* 226*  BUN 22 25*  CREATININE 1.27* 1.23  CALCIUM 8.6* 7.9*  AST 47*  --   ALT 26  --   ALKPHOS 54  --   BILITOT 0.9  --    ------------------------------------------------------------------------------------------------------------------  Cardiac Enzymes Recent Labs  Lab 02/08/18 1727  TROPONINI 0.58*   ------------------------------------------------------------------------------------------------------------------  RADIOLOGY:  Ct Angio Chest Pe W And/or Wo Contrast  Result Date: 02/08/2018 CLINICAL DATA:  Short of breath. Suspect pulmonary embolism. History of bladder cancer hypertension diabetes EXAM: CT ANGIOGRAPHY CHEST WITH CONTRAST TECHNIQUE: Multidetector CT imaging of the chest was performed using the standard protocol during bolus administration of intravenous contrast. Multiplanar CT image reconstructions and MIPs were obtained to evaluate the vascular anatomy. CONTRAST:  20mL OMNIPAQUE IOHEXOL 350 MG/ML SOLN COMPARISON:  Chest 02/08/2018 FINDINGS: Cardiovascular: Negative for pulmonary embolism. Atherosclerotic aorta. Prior CABG with coronary calcification. Heart size normal. No pericardial effusion. Mediastinum/Nodes: Subcarinal calcified lymph node appears benign. No mass lesion in the mediastinum. Lungs/Pleura: Negative for infiltrate effusion or mass. Lungs well aerated. Minimal dependent atelectasis in the lung bases. Upper Abdomen: Calcified gallstone.  No mass lesion. Musculoskeletal: Advanced  degenerative change in the shoulder bilaterally. Degenerative change in spurring in the thoracic spine without fracture. Review of  the MIP images confirms the above findings. IMPRESSION: 1. Negative for pulmonary embolism. No acute abnormality in the chest. 2. Calcified gallstone Aortic Atherosclerosis (ICD10-I70.0). Electronically Signed   By: Franchot Gallo M.D.   On: 02/08/2018 10:35   Dg Chest Port 1 View  Result Date: 02/08/2018 CLINICAL DATA:  Stridor. Fever. EXAM: PORTABLE CHEST 1 VIEW COMPARISON:  Chest x-ray dated 02/08/2018 and chest CT dated 02/08/2018 FINDINGS: The heart size and pulmonary vascularity are normal. Tortuosity and calcification of the thoracic aorta. CABG. No infiltrates or effusions. No acute bone abnormality. Moderately severe arthritic changes of the right shoulder. IMPRESSION: No acute cardiopulmonary abnormalities. Electronically Signed   By: Lorriane Shire M.D.   On: 02/08/2018 16:32   Dg Chest Port 1 View  Result Date: 02/08/2018 CLINICAL DATA:  Shortness of breath and fever. Diagnosed with urinary tract infection yesterday but is not begun on antibiotics. History of CHF, diabetes, former smoker. EXAM: PORTABLE CHEST 1 VIEW COMPARISON:  PA and lateral chest x-ray of June 08, 2017 FINDINGS: The lungs are mildly hypoinflated especially on the left. There is no focal infiltrate nor pleural effusion. The heart and pulmonary vascularity are normal. The pulmonary vascularity is not engorged. There is calcification in the wall of the aortic arch. The patient has undergone previous CABG. IMPRESSION: Mild hypoinflation results in some vascular crowding. No pneumonia nor CHF. Electronically Signed   By: David  Martinique M.D.   On: 02/08/2018 09:46    EKG:   Orders placed or performed during the hospital encounter of 02/08/18  . ED EKG 12-Lead  . ED EKG 12-Lead  . EKG 12-Lead  . EKG 12-Lead  . EKG  . EKG 12-Lead  . EKG 12-Lead    ASSESSMENT AND PLAN:    1.  Acute  metabolic encephalopathy secondary to Clinical sepsis.  Likely from acute cystitis with recent cystoscopy.   Clinically improving , mentation back to normal according to the family members at bedside  Patient initially started on Rocephin which is changed to IV levofloxacin   blood cultures are negative so far  urine cultures greater than 40,000 colonies of enterococcus  faecalis  procalcitonin 0.5-1.7 influenza swab negative  IV fluid hydration.  2.  Borderline elevated troponin likely secondary to sepsis and demand ischemia.  Serial troponins 0.48-0.67-0.58.  Patient is chest pain-free  Monitor on telemetry.   Continue aspirin and metoprolol.  Hold off on heparin drip at this time.  3.  Chronic kidney disease stage III.  Monitor with IV fluids  4.  Chronic anemia.  Add on a ferritin.  5.  History of bladder cancer-follow-up with urology as recommended  6.  Type 2 diabetes mellitus put on sliding scale and hold oral medications  7.  History of chronic diastolic congestive heart failure.  No current signs.  Monitor closely with IV fluids.  8.  Weakness  likely secondary to sepsis.  Physical therapy evaluation.    All the records are reviewed and case discussed with Care Management/Social Workerr. Management plans discussed with the patient, family and they are in agreement.  CODE STATUS: DNR  TOTAL TIME TAKING CARE OF THIS PATIENT: 36 minutes.   POSSIBLE D/C IN 1-2  DAYS, DEPENDING ON CLINICAL CONDITION.  Note: This dictation was prepared with Dragon dictation along with smaller phrase technology. Any transcriptional errors that result from this process are unintentional.   Nicholes Mango M.D on 02/09/2018 at 1:24 PM  Between 7am to 6pm - Pager - [phone] After 6pm go  to [email or web address] - password [email or web address] [facility]  Office  [phone]  CC: Primary care physician; Venita Lick, NP

## 2018-02-10 ENCOUNTER — Telehealth: Payer: Self-pay | Admitting: Nurse Practitioner

## 2018-02-10 LAB — URINE CULTURE
Culture: >=100000 — AB
Culture: 40000 — AB

## 2018-02-10 LAB — BASIC METABOLIC PANEL
Anion gap: 6 (ref 5–15)
BUN: 38 mg/dL — ABNORMAL HIGH (ref 8–23)
CO2: 24 mmol/L (ref 22–32)
Calcium: 8.2 mg/dL — ABNORMAL LOW (ref 8.9–10.3)
Chloride: 111 mmol/L (ref 98–111)
Creatinine, Ser: 1.16 mg/dL (ref 0.61–1.24)
GFR calc Af Amer: >60 mL/min (ref >60)
GFR calc non Af Amer: 60 mL/min — ABNORMAL LOW (ref >60)
Glucose, Bld: 200 mg/dL — ABNORMAL HIGH (ref 70–99)
Potassium: 4.2 mmol/L (ref 3.5–5.1)
Sodium: 141 mmol/L (ref 135–145)

## 2018-02-10 LAB — CBC
HCT: 26.4 % — ABNORMAL LOW (ref 39.0–52.0)
Hemoglobin: 8 g/dL — ABNORMAL LOW (ref 13.0–17.0)
MCH: 25.1 pg — ABNORMAL LOW (ref 26.0–34.0)
MCHC: 30.3 g/dL (ref 30.0–36.0)
MCV: 82.8 fL (ref 80.0–100.0)
Platelets: 147 10*3/uL — ABNORMAL LOW (ref 150–400)
RBC: 3.19 MIL/uL — ABNORMAL LOW (ref 4.22–5.81)
RDW: 15.4 % (ref 11.5–15.5)
WBC: 10.5 10*3/uL (ref 4.0–10.5)
nRBC: 0 % (ref 0.0–0.2)

## 2018-02-10 LAB — GLUCOSE, CAPILLARY
Glucose-Capillary: 191 mg/dL — ABNORMAL HIGH (ref 70–99)
Glucose-Capillary: 237 mg/dL — ABNORMAL HIGH (ref 70–99)
Glucose-Capillary: 210 mg/dL — ABNORMAL HIGH (ref 70–99)

## 2018-02-10 LAB — PROCALCITONIN: Procalcitonin: 1.4 ng/mL

## 2018-02-10 MED ORDER — ACETAMINOPHEN 325 MG PO TABS: 650.0000 mg | ORAL_TABLET | Freq: Four times a day (QID) | ORAL | Status: DC | PRN

## 2018-02-10 MED ORDER — PREDNISONE 10 MG PO TABS: 10.0000 mg | ORAL_TABLET | Freq: Two times a day (BID) | ORAL | Status: DC

## 2018-02-10 MED ORDER — AMOXICILLIN 500 MG PO CAPS
500.0000 mg | ORAL_CAPSULE | Freq: Three times a day (TID) | ORAL | Status: DC
  Filled 2018-02-10 (×2): qty 1

## 2018-02-10 MED ORDER — FUROSEMIDE 40 MG PO TABS: 20.0000 mg | ORAL_TABLET | Freq: Every day | ORAL | 0 refills | Status: DC

## 2018-02-10 MED ORDER — PREDNISONE 10 MG PO TABS: 10.0000 mg | ORAL_TABLET | Freq: Every day | ORAL | 0 refills | Status: AC

## 2018-02-10 MED ORDER — AMOXICILLIN 500 MG PO CAPS: 500.0000 mg | ORAL_CAPSULE | Freq: Three times a day (TID) | ORAL | 0 refills | Status: AC

## 2018-02-10 NOTE — Telephone Encounter (Signed)
Accidental note, error.

## 2018-02-10 NOTE — Progress Notes (Signed)
Patient voided 1mls, notified Dr. Margaretmary Eddy, received order to discharge patient. Patient discharged home with spouse. Patient verbalized understanding of education. Patient with no complaints.

## 2018-02-10 NOTE — Discharge Summary (Signed)
[facility] [place] NAME: John [name]    MR#:  [id number]  DATE OF BIRTH:  09/29/1940  DATE OF ADMISSION:  02/08/2018 ADMITTING PHYSICIAN: Loletha Grayer, MD  DATE OF DISCHARGE: 02/10/18   PRIMARY CARE PHYSICIAN: Venita Lick, NP    ADMISSION DIAGNOSIS:  Elevated troponin [R79.89] Acute cystitis without hematuria [N30.00] Severe sepsis ([place]) [A41.9, R65.20]  DISCHARGE DIAGNOSIS:  Active Problems:   Sepsis ([place]) Acute cystitis with enterococcus Chronic hearing problems  SECONDARY DIAGNOSIS:   Past Medical History:  Diagnosis Date  . Anemia   . Anginal pain ([place])   . Aortic stenosis   . Cancer ([place])    bladder  . Cataracts, bilateral   . CHF (congestive heart failure) ([place])   . Chronic kidney disease   . Coronary artery disease   . Diabetes mellitus without complication ([place])    takes Janumet daily...dx approx. 2009  . Dyspnea   . Dysrhythmia   . Heart murmur   . History of colon polyps    benign  . Hyperlipidemia    takes Lovastatin daily  . Hypertension    takes Hyzaar and Amlodipine daily  . Joint pain   . Medical history non-contributory   . Muscle cramps    occasionally in legs   . Osteoarthritis   . Pre-diabetes   . Skin cancer of face   . Urinary urgency     HOSPITAL COURSE:   HPI  John [name]  is a 77 y.o. male with a known history of bladder cancer with recent cystoscopy.  Today brought him into the emergency room last night because he was staggering around and staring at the floor.  They diagnosed him with a urinary tract infection and sent him home.  Last night he could not get to the bathroom and back into the bed without a lot of help.  And this morning they could not get him up.  At home had a fever of 103 and this morning in the ER 102.  Urinalysis from last night was positive.  This morning he did have some shortness of breath.  Yesterday some nausea vomiting.  No chest pain.  Hospitalist services were contacted for further evaluation for sepsis.  Hospital course 1.  Acute metabolic encephalopathy secondary toClinical sepsis. Likely from acute cystitis with recent cystoscopy.  Clinically improving , mentation back to normal according to the family members at bedside  Patient initially started on Rocephin which is changed to IV levofloxacin  blood cultures are negative so far  urine cultures greater than 40,000 colonies of enterococcus faecalis pansensitive discharge patient with amoxicillin, discontinue levofloxacin IV  procalcitonin 0.5-1.7--1.40 influenza swab negative IV fluid hydration provided  2. Borderline elevated troponin likely secondary to sepsis and demand ischemia. Serial troponins 0.48-0.67-0.58.  Patient is chest pain-free Monitor on telemetry.  Continue aspirin and metoprolol.   3. Chronic kidney disease stage III.  Provided IV fluids  4. Chronic anemia.  PCP to closely monitor  5. History of bladder cancer-follow-up with urology as recommended  6. Type 2 diabetes mellitus put on sliding scale and hold oral medications  7. History of chronic diastolic congestive heart failure. No current signs.  Resume lasix with pot supplements Continue home medication metoprolol and holding Cozaar and amlodipine at this time   8. Weakness  likely secondary to sepsis.  Physical therapy seen the patient and no PT needs identified  Discharge patient home, discussed with wife she is  agreeable  DISCHARGE CONDITIONS:   Stable  CONSULTS OBTAINED:     PROCEDURES none  DRUG ALLERGIES:   Allergies  Allergen Reactions  . Aspirin Other (See Comments)    bruising    DISCHARGE MEDICATIONS:   Allergies as of 02/10/2018      Reactions   Aspirin Other (See Comments)   bruising      Medication List    STOP taking these medications   amLODipine 5 MG tablet Commonly known as:  NORVASC   cephALEXin 500 MG  capsule Commonly known as:  KEFLEX   losartan 50 MG tablet Commonly known as:  COZAAR     TAKE these medications   acetaminophen 325 MG tablet Commonly known as:  TYLENOL Take 2 tablets (650 mg total) by mouth every 6 (six) hours as needed for mild pain (or Fever >/= 101).   amoxicillin 500 MG capsule Commonly known as:  AMOXIL Take 1 capsule (500 mg total) by mouth 3 (three) times daily for 5 days.   aspirin 81 MG EC tablet Take 1 tablet (81 mg total) by mouth daily.   atorvastatin 40 MG tablet Commonly known as:  LIPITOR Take 1 tablet (40 mg total) by mouth daily at 6 PM.   cholecalciferol 25 MCG (1000 UT) tablet Commonly known as:  VITAMIN D Take 2,000 Units by mouth daily.   furosemide 40 MG tablet Commonly known as:  LASIX Take 1 tablet (40 mg total) by mouth daily.   KLOR-CON M10 10 MEQ tablet Generic drug:  potassium chloride TAKE 1 TABLET BY MOUTH ONCE DAILY   metoprolol tartrate 25 MG tablet Commonly known as:  LOPRESSOR Take 1 tablet (25 mg total) by mouth 2 (two) times daily.   predniSONE 10 MG tablet Commonly known as:  DELTASONE Take 1 tablet (10 mg total) by mouth daily with breakfast for 5 days.   RA VITAMIN B-12 TR 1000 MCG Tbcr Generic drug:  Cyanocobalamin Take 1,000 mcg by mouth daily.   SitaGLIPtin-MetFORMIN HCl 50-1000 MG Tb24 Commonly known as:  JANUMET XR Take 1 tablet by mouth 2 (two) times daily.   vitamin C 1000 MG tablet Take 1,000 mg by mouth daily.        DISCHARGE INSTRUCTIONS:   Follow up with primary care physician in 3 days Follow-up with urology regarding the bladder lesions as recommended or in a week Follow-up with outpatient CHF clinic in 3 to 5 days Monitor daily weights, intake and output   DIET:  Cardiac diet and Diabetic diet  DISCHARGE CONDITION:  Fair  ACTIVITY:  Activity as tolerated  OXYGEN:  Home Oxygen: No.   Oxygen Delivery: room air  DISCHARGE LOCATION:  home   If you experience  worsening of your admission symptoms, develop shortness of breath, life threatening emergency, suicidal or homicidal thoughts you must seek medical attention immediately by calling 911 or calling your MD immediately  if symptoms less severe.  You Must read complete instructions/literature along with all the possible adverse reactions/side effects for all the Medicines you take and that have been prescribed to you. Take any new Medicines after you have completely understood and accpet all the possible adverse reactions/side effects.   Please note  You were cared for by a hospitalist during your hospital stay. If you have any questions about your discharge medications or the care you received while you were in the hospital after you are discharged, you can call the unit and asked to speak with the hospitalist on call  if the hospitalist that took care of you is not available. Once you are discharged, your primary care physician will handle any further medical issues. Please note that NO REFILLS for any discharge medications will be authorized once you are discharged, as it is imperative that you return to your primary care physician (or establish a relationship with a primary care physician if you do not have one) for your aftercare needs so that they can reassess your need for medications and monitor your lab values.     Today  Chief Complaint  Patient presents with  . Weakness   Patient is feeling much better.  Denies any chest pain or abdominal pain or shortness of breath.  Wants to go home.  No PT needs identified per PT evaluation Patient is hard of hearing but answering all questions appropriately  ROS:  CONSTITUTIONAL: Denies fevers, chills. Denies any fatigue, weakness.  EYES: Denies blurry vision, double vision, eye pain. EARS, NOSE, THROAT: Denies tinnitus, ear pain, hearing loss. RESPIRATORY: Denies cough, wheeze, shortness of breath.  CARDIOVASCULAR: Denies chest pain, palpitations,  edema.  GASTROINTESTINAL: Denies nausea, vomiting, diarrhea, abdominal pain. Denies bright red blood per rectum. GENITOURINARY: Denies dysuria, hematuria. ENDOCRINE: Denies nocturia or thyroid problems. HEMATOLOGIC AND LYMPHATIC: Denies easy bruising or bleeding. SKIN: Denies rash or lesion. MUSCULOSKELETAL: Denies pain in neck, back, shoulder, knees, hips or arthritic symptoms.  NEUROLOGIC: Denies paralysis, paresthesias.  PSYCHIATRIC: Denies anxiety or depressive symptoms.   VITAL SIGNS:  Blood pressure 137/77, pulse 92, temperature 98.1 F (36.7 C), temperature source Oral, resp. rate 16, height 5\' 11"  (1.803 m), weight 95.7 kg, SpO2 97 %.  I/O:    Intake/Output Summary (Last 24 hours) at 02/10/2018 1250 Last data filed at 02/09/2018 1903 Gross per 24 hour  Intake 240 ml  Output 575 ml  Net -335 ml    PHYSICAL EXAMINATION:  GENERAL:  77 y.o.-year-old patient lying in the bed with no acute distress.  EYES: Pupils equal, round, reactive to light and accommodation. No scleral icterus. Extraocular muscles intact.  HEENT: Head atraumatic, normocephalic. Oropharynx and nasopharynx clear.  NECK:  Supple, no jugular venous distention. No thyroid enlargement, no tenderness.  LUNGS: Normal breath sounds bilaterally, no wheezing, rales,rhonchi or crepitation. No use of accessory muscles of respiration.  CARDIOVASCULAR: S1, S2 normal. No murmurs, rubs, or gallops.  ABDOMEN: Soft, non-tender, non-distended. Bowel sounds present.  EXTREMITIES: No pedal edema, cyanosis, or clubbing.  NEUROLOGIC: Awake, alert and oriented x3 sensation intact. Gait not checked.  PSYCHIATRIC: The patient is alert and oriented x 3.  SKIN: No obvious rash, lesion, or ulcer.   DATA REVIEW:   CBC Recent Labs  Lab 02/10/18 0411  WBC 10.5  HGB 8.0*  HCT 26.4*  PLT 147*    Chemistries  Recent Labs  Lab 02/08/18 0913  02/10/18 0411  NA 137   < > 141  K 3.4*   < > 4.2  CL 103   < > 111  CO2 23   <  > 24  GLUCOSE 198*   < > 200*  BUN 22   < > 38*  CREATININE 1.27*   < > 1.16  CALCIUM 8.6*   < > 8.2*  AST 47*  --   --   ALT 26  --   --   ALKPHOS 54  --   --   BILITOT 0.9  --   --    < > = values in this interval not displayed.  Cardiac Enzymes Recent Labs  Lab 02/08/18 1727  TROPONINI 0.58*    Microbiology Results  Results for orders placed or performed during the hospital encounter of 02/08/18  Blood Culture (routine x 2)     Status: None (Preliminary result)   Collection Time: 02/08/18  9:13 AM  Result Value Ref Range Status   Specimen Description BLOOD LEFT FA  Final   Special Requests   Final    BOTTLES DRAWN AEROBIC AND ANAEROBIC Blood Culture results may not be optimal due to an excessive volume of blood received in culture bottles   Culture   Final    NO GROWTH 2 DAYS Performed at [facility], [address], [place], [place] [address]    Report Status PENDING  Incomplete  Blood Culture (routine x 2)     Status: None (Preliminary result)   Collection Time: 02/08/18  9:13 AM  Result Value Ref Range Status   Specimen Description BLOOD RIGHT FA  Final   Special Requests   Final    BOTTLES DRAWN AEROBIC AND ANAEROBIC Blood Culture results may not be optimal due to an excessive volume of blood received in culture bottles   Culture   Final    NO GROWTH 2 DAYS Performed at [facility], [address] [place], [place] [address]    Report Status PENDING  Incomplete  Urine culture     Status: Abnormal   Collection Time: 02/08/18 11:22 AM  Result Value Ref Range Status   Specimen Description   Final    URINE, RANDOM Performed at [facility], [address], [place], [place] [address]    Special Requests   Final    NONE Performed at [facility], [address] [place], [place] [address]    Culture 40,000 COLONIES/mL ENTEROCOCCUS FAECALIS (A)  Final   Report Status 02/10/2018 FINAL  Final   Organism ID, Bacteria  ENTEROCOCCUS FAECALIS (A)  Final      Susceptibility   Enterococcus faecalis - MIC*    AMPICILLIN <=2 SENSITIVE Sensitive     LEVOFLOXACIN 1 SENSITIVE Sensitive     NITROFURANTOIN <=16 SENSITIVE Sensitive     VANCOMYCIN 1 SENSITIVE Sensitive     * 40,000 COLONIES/mL ENTEROCOCCUS FAECALIS    RADIOLOGY:  Ct Angio Chest Pe W And/or Wo Contrast  Result Date: 02/08/2018 CLINICAL DATA:  Short of breath. Suspect pulmonary embolism. History of bladder cancer hypertension diabetes EXAM: CT ANGIOGRAPHY CHEST WITH CONTRAST TECHNIQUE: Multidetector CT imaging of the chest was performed using the standard protocol during bolus administration of intravenous contrast. Multiplanar CT image reconstructions and MIPs were obtained to evaluate the vascular anatomy. CONTRAST:  49mL OMNIPAQUE IOHEXOL 350 MG/ML SOLN COMPARISON:  Chest 02/08/2018 FINDINGS: Cardiovascular: Negative for pulmonary embolism. Atherosclerotic aorta. Prior CABG with coronary calcification. Heart size normal. No pericardial effusion. Mediastinum/Nodes: Subcarinal calcified lymph node appears benign. No mass lesion in the mediastinum. Lungs/Pleura: Negative for infiltrate effusion or mass. Lungs well aerated. Minimal dependent atelectasis in the lung bases. Upper Abdomen: Calcified gallstone.  No mass lesion. Musculoskeletal: Advanced degenerative change in the shoulder bilaterally. Degenerative change in spurring in the thoracic spine without fracture. Review of the MIP images confirms the above findings. IMPRESSION: 1. Negative for pulmonary embolism. No acute abnormality in the chest. 2. Calcified gallstone Aortic Atherosclerosis (ICD10-I70.0). Electronically Signed   By: Franchot Gallo M.D.   On: 02/08/2018 10:35   Dg Chest Port 1 View  Result Date: 02/08/2018 CLINICAL DATA:  Stridor.  Fever. EXAM: PORTABLE CHEST 1 VIEW COMPARISON:  Chest x-ray dated 02/08/2018 and chest CT dated 02/08/2018 FINDINGS: The heart size and pulmonary vascularity  are normal. Tortuosity and calcification of the thoracic aorta. CABG. No infiltrates or effusions. No acute bone abnormality. Moderately severe arthritic changes of the right shoulder. IMPRESSION: No acute cardiopulmonary abnormalities. Electronically Signed   By: Lorriane Shire M.D.   On: 02/08/2018 16:32   Dg Chest Port 1 View  Result Date: 02/08/2018 CLINICAL DATA:  Shortness of breath and fever. Diagnosed with urinary tract infection yesterday but is not begun on antibiotics. History of CHF, diabetes, former smoker. EXAM: PORTABLE CHEST 1 VIEW COMPARISON:  PA and lateral chest x-ray of June 08, 2017 FINDINGS: The lungs are mildly hypoinflated especially on the left. There is no focal infiltrate nor pleural effusion. The heart and pulmonary vascularity are normal. The pulmonary vascularity is not engorged. There is calcification in the wall of the aortic arch. The patient has undergone previous CABG. IMPRESSION: Mild hypoinflation results in some vascular crowding. No pneumonia nor CHF. Electronically Signed   By: David  Martinique M.D.   On: 02/08/2018 09:46    EKG:   Orders placed or performed during the hospital encounter of 02/08/18  . ED EKG 12-Lead  . ED EKG 12-Lead  . EKG 12-Lead  . EKG 12-Lead  . EKG  . EKG 12-Lead  . EKG 12-Lead      Management plans discussed with the patient, wife and they are in agreement.  CODE STATUS:     Code Status Orders  (From admission, onward)         Start     Ordered   02/08/18 1112  Do not attempt resuscitation (DNR)  Continuous    Question Answer Comment  In the event of cardiac or respiratory ARREST Do not call a "code blue"   In the event of cardiac or respiratory ARREST Do not perform Intubation, CPR, defibrillation or ACLS   In the event of cardiac or respiratory ARREST Use medication by any route, position, wound care, and other measures to relive pain and suffering. May use oxygen, suction and manual treatment of airway obstruction  as needed for comfort.   Comments nurse may pronounce      02/08/18 1112        Code Status History    Date Active Date Inactive Code Status Order ID Comments User Context   05/05/2017 1452 05/10/2017 1529 Full Code [id number]  Miguel Aschoff Inpatient   04/21/2017 1051 04/21/2017 1540 Full Code [id number]  Isaias Cowman, MD Inpatient   01/12/2016 1620 01/13/2016 1704 Full Code [id number]  Rod Can, MD Inpatient      TOTAL TIME TAKING CARE OF THIS PATIENT: 45  minutes.   Note: This dictation was prepared with Dragon dictation along with smaller phrase technology. Any transcriptional errors that result from this process are unintentional.   @[facility] @  on 02/10/2018 at 12:50 PM  Between 7am to 6pm - Pager - [phone]  After 6pm go to [email or web address] - password [email or web address] [facility]  Office  [phone]  CC: Primary care physician; Venita Lick, NP

## 2018-02-10 NOTE — Care Management Important Message (Signed)
Important Message  Patient Details  Name: John [name] MRN: [id number] Date of Birth: [birth year]   Medicare Important Message Given:  Yes    Juliann Pulse A Graden Hoshino 02/10/2018, 1:12 PM

## 2018-02-10 NOTE — Progress Notes (Addendum)
Foley catheter discontinued per order. Will monitor patient for post removal voiding. Discussed with spouse of foley removal, patient seems confused at times, wife states patient has cognitive changes with recurrent UTI's. Discussed with Dr. Margaretmary Eddy of foley removal and his cognitive status. Continuing to monitor patient.

## 2018-02-10 NOTE — Discharge Instructions (Signed)
Follow up with primary care physician in 3 days Follow-up with urology regarding the bladder lesions as recommended or in a week Follow-up with outpatient CHF clinic in 3 to 5 days Monitor daily weights, intake and output

## 2018-02-13 ENCOUNTER — Other Ambulatory Visit: Payer: Self-pay

## 2018-02-13 LAB — CULTURE, BLOOD (ROUTINE X 2)
CULTURE: NO GROWTH
Culture: NO GROWTH

## 2018-02-13 NOTE — Patient Outreach (Signed)
[facility]) Care Management  [place]   02/13/2018  John [name] 12-23-1940 [id number]   Reason for referral: Post discharge medication review  PMHx: Bladder cancer, anemia, aortic stenosis, congestive heart failure, chronic kidney disease, coronary artery disease, diabetes, dyspnea, hyperlipidemia, hypertension, urinary urgency.    Outreach:  Patient reports he is doing fair since discharge. He was able to obtain his antibiotic and has been taking it as scheduled. He reports some weakness. During the interview he handed his wife the phone who reports the patient is experiencing some pain on urination that started today, 12/16. Described as sharp pain in his lower back. Patient is scheduled with his PCP 12/17. Wife reports their office is aware of this new issue and worked him in as soon as possible with instructions to call back if pain worsens today. Patient denies fever. Patient denies adverse effects from antibiotoic.   Objective: Lab Results  Component Value Date   CREATININE 1.16 02/10/2018   CREATININE 1.23 02/09/2018   CREATININE 1.27 (H) 02/08/2018    Lab Results  Component Value Date   HGBA1C 6.4 (H) 02/08/2018    Lipid Panel     Component Value Date/Time   CHOL 90 02/09/2018 0512   CHOL 113 06/07/2017 0859   CHOL 192 05/19/2015 0918   TRIG 67 02/09/2018 0512   TRIG 106 05/19/2015 0918   HDL 34 (L) 02/09/2018 0512   HDL 54 06/07/2017 0859   CHOLHDL 2.6 02/09/2018 0512   VLDL 13 02/09/2018 0512   VLDL 21 05/19/2015 0918   LDLCALC 43 02/09/2018 0512   LDLCALC 43 06/07/2017 0859    BP Readings from Last 3 Encounters:  02/10/18 137/77  02/07/18 (!) 163/75  02/03/18 132/65    Allergies  Allergen Reactions  . Aspirin Other (See Comments)    bruising    Medications Reviewed Today    Reviewed by Aldona Lento, RN (Registered Nurse) on 02/08/18 at 1221  Med List Status: Complete  Medication Order Taking? Sig Documenting  Provider Last Dose Status Informant  amLODipine (NORVASC) 5 MG tablet [id number] Yes Take 5 mg by mouth daily. [provider] 02/07/2018 0700 Active Self  Ascorbic Acid (VITAMIN C) 1000 MG tablet [id number] Yes Take 1,000 mg by mouth daily. [provider] 02/07/2018 0700 Active Self  aspirin 81 MG EC tablet [id number] Yes Take 1 tablet (81 mg total) by mouth daily. Elgie Collard, [place] 02/07/2018 0700 Active Self  atorvastatin (LIPITOR) 40 MG tablet [id number] Yes Take 1 tablet (40 mg total) by mouth daily at 6 PM. Elgie Collard, PA-C 02/07/2018 0700 Active Self  cephALEXin (KEFLEX) 500 MG capsule [id number] No Take 1 capsule (500 mg total) by mouth 3 (three) times daily for 7 days.  Patient not taking:  Reported on 02/08/2018   Rudene Re, MD Not Taking Unknown time Active Self  cholecalciferol (VITAMIN D) 1000 units tablet [id number] Yes Take 2,000 Units by mouth daily. [provider] 02/07/2018 0700 Active Self  Cyanocobalamin (RA VITAMIN B-12 TR) 1000 MCG TBCR [id number] Yes Take 1,000 mcg by mouth daily.  [provider] 02/07/2018 0700 Active Self           Med Note Arvella Nigh, MORGAN A   Thu Jan 01, 2016  8:13 AM)    furosemide (LASIX) 40 MG tablet [id number] Yes Take 1 tablet (40 mg total) by mouth daily. Gaye Pollack, MD 02/07/2018 0700 Active Self  KLOR-CON M10 10 MEQ tablet [id number] Yes TAKE 1  TABLET BY MOUTH ONCE DAILY Cannady, Jolene T, NP 02/07/2018 0700 Active Self  lidocaine (XYLOCAINE) 2 % jelly 1 application [id number]   Stoioff, Scott C, MD  Active   losartan (COZAAR) 50 MG tablet [id number] Yes Take 50 mg by mouth daily. [provider] 02/07/2018 0700 Active Self  metoprolol tartrate (LOPRESSOR) 25 MG tablet [id number] Yes Take 1 tablet (25 mg total) by mouth 2 (two) times daily. Elgie Collard, [place] 02/07/2018 2100 Active Self  SitaGLIPtin-MetFORMIN HCl (JANUMET XR) 50-1000 MG TB24 [id number] Yes Take 1 tablet by mouth 2 (two) times  daily. Kathrine Haddock, NP 02/07/2018 2100 Active Self           ASSESSMENT: Date Discharged from Hospital: 02/10/18 Date Medication Reconciliation Performed: 02/13/2018   Medications Discontinued at Discharge:   Amlodipine  Cephalexin     Losartan  New Medications at Discharge:   Amoxicillin   Patient was recently discharged from hospital and all medications have been reviewed.  Drugs sorted by system:  Cardiovascular: atorvastatin, aspirin, metoprolol tartrate, furosemide   Endocrine: sitagliptin-metformin   Pain: lidocaine, acetaminophen   Infectious Diseases: Amoxicillin   Vitamins/Minerals/Supplements: KCl 40mEq, Vitamin D, Vitamin B12, Vitamin C   Miscellaneous: Prednisone   Medication Review Findings:  . Current duration of antibiotics 8 days (12/11-12/17), consider extension to 10-14 days in light of flank pain to adequately treat pyelonephritis with beta-lactam  . Patient has CKD, re-add losartan when blood pressure allows   PLAN: -Instructed patient to take new medications as prescribed and discontinue old medications as prescribed.  -Patient encouraged to follow up with PCP tomorrow at scheduled appointment or sooner if symptoms worsen, including fever, worsening pain, general malaise.   Azzie Roup D PGY1 Pharmacy Resident  Phone [phone] Please use [facility] for clinical pharmacists numbers  02/13/2018      9:47 AM

## 2018-02-14 ENCOUNTER — Telehealth: Payer: Self-pay | Admitting: Nurse Practitioner

## 2018-02-14 ENCOUNTER — Encounter: Payer: Self-pay | Admitting: Nurse Practitioner

## 2018-02-14 ENCOUNTER — Ambulatory Visit (INDEPENDENT_AMBULATORY_CARE_PROVIDER_SITE_OTHER): Payer: PPO | Admitting: Nurse Practitioner

## 2018-02-14 VITALS — BP 136/68 | HR 84 | Temp 98.4°F | Ht 71.0 in | Wt 206.5 lb

## 2018-02-14 DIAGNOSIS — C679 Malignant neoplasm of bladder, unspecified: Secondary | ICD-10-CM

## 2018-02-14 DIAGNOSIS — I10 Essential (primary) hypertension: Secondary | ICD-10-CM

## 2018-02-14 DIAGNOSIS — A419 Sepsis, unspecified organism: Secondary | ICD-10-CM | POA: Diagnosis not present

## 2018-02-14 DIAGNOSIS — R011 Cardiac murmur, unspecified: Secondary | ICD-10-CM

## 2018-02-14 DIAGNOSIS — N183 Chronic kidney disease, stage 3 (moderate): Secondary | ICD-10-CM | POA: Diagnosis not present

## 2018-02-14 DIAGNOSIS — D519 Vitamin B12 deficiency anemia, unspecified: Secondary | ICD-10-CM

## 2018-02-14 DIAGNOSIS — E1122 Type 2 diabetes mellitus with diabetic chronic kidney disease: Secondary | ICD-10-CM | POA: Diagnosis not present

## 2018-02-14 MED ORDER — OMEPRAZOLE 20 MG PO CPDR: 20.0000 mg | DELAYED_RELEASE_CAPSULE | Freq: Every day | ORAL | 3 refills | Status: DC

## 2018-02-14 MED ORDER — LOSARTAN POTASSIUM 25 MG PO TABS: 25.0000 mg | ORAL_TABLET | Freq: Every day | ORAL | 2 refills | Status: DC

## 2018-02-14 MED ORDER — LOSARTAN POTASSIUM 25 MG PO TABS: 12.5000 mg | ORAL_TABLET | Freq: Every day | ORAL | 2 refills | Status: DC

## 2018-02-14 NOTE — Telephone Encounter (Signed)
Call received by [facility] pharmacist [place] with question about pt dose of losartan. Per office visit note of today by [facility]. Dose was reported as 12.5 mg daily.

## 2018-02-14 NOTE — Assessment & Plan Note (Signed)
A1C 6.4% in hospital.  Continue current diabetic regimen.  Consider nephrology referral at upcoming appointment.  CMP today.

## 2018-02-14 NOTE — Telephone Encounter (Signed)
That is correct.  12.5 MG daily.  I had originally ordered 25, but changed it to 12.5.  Thanks.

## 2018-02-14 NOTE — Assessment & Plan Note (Signed)
Restart Losartan at 12.5 MG daily (kidney protection).  Monitor closely at home and recheck BP on 03/13/17 at f/u appointment.  CMP today.

## 2018-02-14 NOTE — Assessment & Plan Note (Signed)
Followed by Dr. Josefa Half, continue to collaborate with cardiology.

## 2018-02-14 NOTE — Telephone Encounter (Signed)
Pharmacy notified.

## 2018-02-14 NOTE — Progress Notes (Signed)
BP 136/68 (BP Location: Left Arm, Patient Position: Sitting)   Pulse 84   Temp 98.4 F (36.9 C)   Ht 5\' 11"  (1.803 m)   Wt 206 lb 8 oz (93.7 kg)   SpO2 94%   BMI 28.80 kg/m    Subjective:    Patient ID: John [name], male    DOB: 24-Feb-1941, 77 y.o.   MRN: [id number]  HPI: John [name] is a 77 y.o. male [place] who presents for hospital follow-up  Chief Complaint  Patient presents with  . Hospitalization Follow-up    UTI, that went the blood stream, currently on antibiotic's. Wife is concerned with his voice at times. Appt with Urology tomorrow     ER FOLLOW UP Admitted to hospital from 02/08/18 to 02/10/18 for acute cystitis with enterococcus and acute metabolic encephalopathy due to sepsis thought to be likely to recent cystoscopy.  Had a cystoscopy performed on 02/03/18 by Dr. Bernardo Heater, who follows patient for bladder CA.  He was treated in hospital with Rocephin and Levofloxacin IV.  Blood cultures were negative.  Urine grew >40,000 enterococcus.  He was discharged home with oral Amoxicillin.  While in hospital he was also noted to have borderline elevation in troponin, likely secondary to sepsis.  He had no complaints of CP at the time.  Of note patient also has CKD followed by , T2DM, and ongoing anemia with baseline HGB in 9's (was 7.8 to 7.9 in March 2019).  His B12 level in October was 311 (low end of normal) and he was instructed to increase dose of B12 to 1000 MCG twice a day.  On 02/10/18 H/H 8/26.4, MCV 82.8, MCH 25.1. PLT 147.  Not currently taking PPI for GI coverage.  Is on daily ASA.  He has f/u with Dr. Bernardo Heater with urology tomorrow and wife reports he is to have "more cancer removal in January".  Followed by Dr. Josefa Half for cardiology. Time since discharge: 02/10/18 Hospital/facility: [facility] Diagnosis: Sepsis secondary to UTI (has h/o similar after previous cystoscopy at [facility]) Procedures/tests: urine and blood testing Consultants: none New medications:  Amoxicillin started, Amlodipine and Losartan d/ced (hypotension with sepsis) Discharge instructions:  Folllow-up with urology and PCP Status: stable   Relevant past medical, surgical, family and social history reviewed and updated as indicated. Interim medical history since our last visit reviewed. Allergies and medications reviewed and updated.  Review of Systems  Constitutional: Negative for activity change, diaphoresis, fatigue and fever.  Respiratory: Negative for cough, chest tightness, shortness of breath and wheezing.   Cardiovascular: Negative for chest pain, palpitations and leg swelling.  Gastrointestinal: Negative for abdominal distention, abdominal pain, constipation, diarrhea, nausea and vomiting.  Endocrine: Negative for cold intolerance, heat intolerance, polydipsia, polyphagia and polyuria.  Genitourinary: Negative for difficulty urinating, dysuria, flank pain, frequency, hematuria and urgency.  Musculoskeletal: Positive for back pain (intermittent, ongoing). Negative for myalgias.  Skin: Negative.   Neurological: Negative for dizziness, syncope, weakness, light-headedness, numbness and headaches.  Psychiatric/Behavioral: Negative.     Per HPI unless specifically indicated above     Objective:    BP 136/68 (BP Location: Left Arm, Patient Position: Sitting)   Pulse 84   Temp 98.4 F (36.9 C)   Ht 5\' 11"  (1.803 m)   Wt 206 lb 8 oz (93.7 kg)   SpO2 94%   BMI 28.80 kg/m   Wt Readings from Last 3 Encounters:  02/14/18 206 lb 8 oz (93.7 kg)  02/08/18 211 lb (95.7 kg)  02/07/18 211 lb (95.7 kg)    Physical Exam Vitals signs and nursing note reviewed.  Constitutional:      General: He is awake.     Appearance: Normal appearance. He is well-developed. He is not ill-appearing.  HENT:     Head: Normocephalic and atraumatic.     Right Ear: Hearing normal. No drainage.     Left Ear: Hearing normal. No drainage.     Mouth/Throat:     Pharynx: Uvula midline.  Eyes:      General: Lids are normal.        Right eye: No discharge.        Left eye: No discharge.     Conjunctiva/sclera: Conjunctivae normal.     Pupils: Pupils are equal, round, and reactive to light.  Neck:     Musculoskeletal: Normal range of motion and neck supple.     Thyroid: No thyromegaly.     Vascular: No carotid bruit or JVD.     Trachea: Trachea normal.  Cardiovascular:     Rate and Rhythm: Normal rate and regular rhythm.     Heart sounds: S1 normal and S2 normal. Murmur present. Systolic (known, followed by cardiology) murmur present with a grade of 2/6. No gallop.   Pulmonary:     Effort: Pulmonary effort is normal.     Breath sounds: Normal breath sounds.  Abdominal:     General: Bowel sounds are normal.     Palpations: Abdomen is soft. There is no hepatomegaly or splenomegaly.     Comments: No suprapubic or CVA tenderness.  Musculoskeletal: Normal range of motion.     Right lower leg: No edema.     Left lower leg: No edema.  Skin:    General: Skin is warm and dry.     Capillary Refill: Capillary refill takes less than 2 seconds.     Findings: No rash.     Comments: Multiple areas of scattered purple to yellow bruising on bilateral arms (IV and blood draws in hospital)  Neurological:     Mental Status: He is alert and oriented to person, place, and time.     Deep Tendon Reflexes: Reflexes are normal and symmetric.  Psychiatric:        Behavior: Behavior normal. Behavior is cooperative.        Thought Content: Thought content normal.        Judgment: Judgment normal.     Results for orders placed or performed during the hospital encounter of 02/08/18  Blood Culture (routine x 2)  Result Value Ref Range   Specimen Description BLOOD LEFT FA    Special Requests      BOTTLES DRAWN AEROBIC AND ANAEROBIC Blood Culture results may not be optimal due to an excessive volume of blood received in culture bottles   Culture      NO GROWTH 5 DAYS Performed at [facility], [address] [place], [place] [address]    Report Status 02/13/2018 FINAL   Blood Culture (routine x 2)  Result Value Ref Range   Specimen Description BLOOD RIGHT FA    Special Requests      BOTTLES DRAWN AEROBIC AND ANAEROBIC Blood Culture results may not be optimal due to an excessive volume of blood received in culture bottles   Culture      NO GROWTH 5 DAYS Performed at [facility], [address] [place], [place] [address]    Report Status 02/13/2018 FINAL   Urine  culture  Result Value Ref Range   Specimen Description      URINE, RANDOM Performed at [facility], [address] [place], [place] [address]    Special Requests      NONE Performed at [facility], [address] [place], [place] [address]    Culture 40,000 COLONIES/mL ENTEROCOCCUS FAECALIS (A)    Report Status 02/10/2018 FINAL    Organism ID, Bacteria ENTEROCOCCUS FAECALIS (A)       Susceptibility   Enterococcus faecalis - MIC*    AMPICILLIN <=2 SENSITIVE Sensitive     LEVOFLOXACIN 1 SENSITIVE Sensitive     NITROFURANTOIN <=16 SENSITIVE Sensitive     VANCOMYCIN 1 SENSITIVE Sensitive     * 40,000 COLONIES/mL ENTEROCOCCUS FAECALIS  Comprehensive metabolic panel  Result Value Ref Range   Sodium 137 135 - 145 mmol/L   Potassium 3.4 (L) 3.5 - 5.1 mmol/L   Chloride 103 98 - 111 mmol/L   CO2 23 22 - 32 mmol/L   Glucose, Bld 198 (H) 70 - 99 mg/dL   BUN 22 8 - 23 mg/dL   Creatinine, Ser 1.27 (H) 0.61 - 1.24 mg/dL   Calcium 8.6 (L) 8.9 - 10.3 mg/dL   Total Protein 7.2 6.5 - 8.1 g/dL   Albumin 3.9 3.5 - 5.0 g/dL   AST 47 (H) 15 - 41 U/L   ALT 26 0 - 44 U/L   Alkaline Phosphatase 54 38 - 126 U/L   Total Bilirubin 0.9 0.3 - 1.2 mg/dL   GFR calc non Af Amer 53 (L) >60 mL/min   GFR calc Af Amer >60 >60 mL/min   Anion gap 11 5 - 15  CBC WITH DIFFERENTIAL  Result Value Ref Range   WBC 12.2 (H) 4.0 - 10.5 K/uL   RBC 3.75 (L) 4.22 - 5.81 MIL/uL   Hemoglobin 9.2  (L) 13.0 - 17.0 g/dL   HCT 30.4 (L) 39.0 - 52.0 %   MCV 81.1 80.0 - 100.0 fL   MCH 24.5 (L) 26.0 - 34.0 pg   MCHC 30.3 30.0 - 36.0 g/dL   RDW 15.4 11.5 - 15.5 %   Platelets 160 150 - 400 K/uL   nRBC 0.0 0.0 - 0.2 %   Neutrophils Relative % 86 %   Neutro Abs 10.6 (H) 1.7 - 7.7 K/uL   Lymphocytes Relative 5 %   Lymphs Abs 0.6 (L) 0.7 - 4.0 K/uL   Monocytes Relative 8 %   Monocytes Absolute 1.0 0.1 - 1.0 K/uL   Eosinophils Relative 0 %   Eosinophils Absolute 0.0 0.0 - 0.5 K/uL   Basophils Relative 0 %   Basophils Absolute 0.0 0.0 - 0.1 K/uL   Immature Granulocytes 1 %   Abs Immature Granulocytes 0.07 0.00 - 0.07 K/uL  Urinalysis, Routine w reflex microscopic  Result Value Ref Range   Color, Urine YELLOW (A) YELLOW   APPearance CLEAR (A) CLEAR   Specific Gravity, Urine 1.023 1.005 - 1.030   pH 6.0 5.0 - 8.0   Glucose, UA 50 (A) NEGATIVE mg/dL   Hgb urine dipstick SMALL (A) NEGATIVE   Bilirubin Urine NEGATIVE NEGATIVE   Ketones, ur 20 (A) NEGATIVE mg/dL   Protein, ur 30 (A) NEGATIVE mg/dL   Nitrite NEGATIVE NEGATIVE   Leukocytes, UA NEGATIVE NEGATIVE   RBC / HPF 6-10 0 - 5 RBC/hpf   WBC, UA 11-20 0 - 5 WBC/hpf   Bacteria, UA RARE (A) NONE SEEN   Squamous Epithelial / LPF  0-5 0 - 5   Mucus PRESENT   Troponin I - ONCE - STAT  Result Value Ref Range   Troponin I 0.48 (HH) <0.03 ng/mL  Protime-INR  Result Value Ref Range   Prothrombin Time 16.7 (H) 11.4 - 15.2 seconds   INR 1.37   APTT  Result Value Ref Range   aPTT 34 24 - 36 seconds  Influenza panel by PCR (type A & B)  Result Value Ref Range   Influenza A By PCR NEGATIVE NEGATIVE   Influenza B By PCR NEGATIVE NEGATIVE  Hemoglobin A1c  Result Value Ref Range   Hgb A1c MFr Bld 6.4 (H) 4.8 - 5.6 %   Mean Plasma Glucose 136.98 mg/dL  Troponin I - Once  Result Value Ref Range   Troponin I 0.67 (HH) <0.03 ng/mL  Troponin I - Once  Result Value Ref Range   Troponin I 0.58 (HH) <0.03 ng/mL  Procalcitonin - Baseline    Result Value Ref Range   Procalcitonin 0.51 ng/mL  Ferritin  Result Value Ref Range   Ferritin 36 24 - 336 ng/mL  Glucose, capillary  Result Value Ref Range   Glucose-Capillary 152 (H) 70 - 99 mg/dL  Basic metabolic panel  Result Value Ref Range   Sodium 139 135 - 145 mmol/L   Potassium 3.8 3.5 - 5.1 mmol/L   Chloride 109 98 - 111 mmol/L   CO2 25 22 - 32 mmol/L   Glucose, Bld 226 (H) 70 - 99 mg/dL   BUN 25 (H) 8 - 23 mg/dL   Creatinine, Ser 1.23 0.61 - 1.24 mg/dL   Calcium 7.9 (L) 8.9 - 10.3 mg/dL   GFR calc non Af Amer 55 (L) >60 mL/min   GFR calc Af Amer >60 >60 mL/min   Anion gap 5 5 - 15  CBC  Result Value Ref Range   WBC 11.5 (H) 4.0 - 10.5 K/uL   RBC 3.26 (L) 4.22 - 5.81 MIL/uL   Hemoglobin 8.1 (L) 13.0 - 17.0 g/dL   HCT 27.5 (L) 39.0 - 52.0 %   MCV 84.4 80.0 - 100.0 fL   MCH 24.8 (L) 26.0 - 34.0 pg   MCHC 29.5 (L) 30.0 - 36.0 g/dL   RDW 15.4 11.5 - 15.5 %   Platelets 124 (L) 150 - 400 K/uL   nRBC 0.0 0.0 - 0.2 %  Lipid panel  Result Value Ref Range   Cholesterol 90 0 - 200 mg/dL   Triglycerides 67 <150 mg/dL   HDL 34 (L) >40 mg/dL   Total CHOL/HDL Ratio 2.6 RATIO   VLDL 13 0 - 40 mg/dL   LDL Cholesterol 43 0 - 99 mg/dL  Procalcitonin  Result Value Ref Range   Procalcitonin 1.70 ng/mL  Glucose, capillary  Result Value Ref Range   Glucose-Capillary 255 (H) 70 - 99 mg/dL  Glucose, capillary  Result Value Ref Range   Glucose-Capillary 256 (H) 70 - 99 mg/dL  Glucose, capillary  Result Value Ref Range   Glucose-Capillary 187 (H) 70 - 99 mg/dL  Glucose, capillary  Result Value Ref Range   Glucose-Capillary 213 (H) 70 - 99 mg/dL  Glucose, capillary  Result Value Ref Range   Glucose-Capillary 207 (H) 70 - 99 mg/dL  Procalcitonin  Result Value Ref Range   Procalcitonin 1.40 ng/mL  Basic metabolic panel  Result Value Ref Range   Sodium 141 135 - 145 mmol/L   Potassium 4.2 3.5 - 5.1 mmol/L   Chloride 111 98 -  111 mmol/L   CO2 24 22 - 32 mmol/L    Glucose, Bld 200 (H) 70 - 99 mg/dL   BUN 38 (H) 8 - 23 mg/dL   Creatinine, Ser 1.16 0.61 - 1.24 mg/dL   Calcium 8.2 (L) 8.9 - 10.3 mg/dL   GFR calc non Af Amer 60 (L) >60 mL/min   GFR calc Af Amer >60 >60 mL/min   Anion gap 6 5 - 15  CBC  Result Value Ref Range   WBC 10.5 4.0 - 10.5 K/uL   RBC 3.19 (L) 4.22 - 5.81 MIL/uL   Hemoglobin 8.0 (L) 13.0 - 17.0 g/dL   HCT 26.4 (L) 39.0 - 52.0 %   MCV 82.8 80.0 - 100.0 fL   MCH 25.1 (L) 26.0 - 34.0 pg   MCHC 30.3 30.0 - 36.0 g/dL   RDW 15.4 11.5 - 15.5 %   Platelets 147 (L) 150 - 400 K/uL   nRBC 0.0 0.0 - 0.2 %  Glucose, capillary  Result Value Ref Range   Glucose-Capillary 283 (H) 70 - 99 mg/dL  Glucose, capillary  Result Value Ref Range   Glucose-Capillary 191 (H) 70 - 99 mg/dL  Glucose, capillary  Result Value Ref Range   Glucose-Capillary 237 (H) 70 - 99 mg/dL  Glucose, capillary  Result Value Ref Range   Glucose-Capillary 210 (H) 70 - 99 mg/dL  CG4 I-STAT (Lactic acid)  Result Value Ref Range   Lactic Acid, Venous 2.40 (HH) 0.5 - 1.9 mmol/L   Comment NOTIFIED PHYSICIAN   CG4 I-STAT (Lactic acid)  Result Value Ref Range   Lactic Acid, Venous 1.55 0.5 - 1.9 mmol/L  ECHOCARDIOGRAM COMPLETE  Result Value Ref Range   Weight 3,376 oz   Height 71 in   BP 110/61 mmHg      Assessment & Plan:   Problem List Items Addressed This Visit      Cardiovascular and Mediastinum   Hypertension    Restart Losartan at 12.5 MG daily (kidney protection).  Monitor closely at home and recheck BP on 03/13/17 at f/u appointment.  CMP today.      Relevant Medications   losartan (COZAAR) 25 MG tablet   Other Relevant Orders   Comprehensive metabolic panel     Endocrine   Type 2 diabetes mellitus with chronic kidney disease ([place])    A1C 6.4% in hospital.  Continue current diabetic regimen.  Consider nephrology referral at upcoming appointment.  CMP today.      Relevant Medications   losartan (COZAAR) 25 MG tablet     Genitourinary    Bladder cancer (HCC)    Continue to collaborate with Dr. Bernardo Heater.  Is scheduled for further surgery in January.        Other   B12 deficiency anemia    Chronic, HGB baseline in 9's.  Last H/H in hospital 02/10/18 8/26.4.  Continue B12 2500 MCG daily.  Start Omeprazole 20 MG daily for GI protection.  Anemia panel and CBC today.  Consider referral to hematology if ongoing issues with anemia.        Relevant Orders   CBC with Differential/Platelet   Anemia panel    Other Visit Diagnoses    Sepsis without acute organ dysfunction, due to unspecified organism [facility])    -  Primary   Acute and improved at this time.  Continue Amoxicillin as ordered.       Follow up plan: Return for as scheduled in January.

## 2018-02-14 NOTE — Assessment & Plan Note (Signed)
Continue to collaborate with Dr. Bernardo Heater.  Is scheduled for further surgery in January.

## 2018-02-14 NOTE — Assessment & Plan Note (Addendum)
Chronic, HGB baseline in 9's.  Last H/H in hospital 02/10/18 8/26.4.  Continue B12 2500 MCG daily.  Start Omeprazole 20 MG daily for GI protection.  Anemia panel and CBC today.  Consider referral to hematology if ongoing issues with anemia.

## 2018-02-14 NOTE — Patient Instructions (Signed)
Anemia Anemia is a condition in which you do not have enough red blood cells or hemoglobin. Hemoglobin is a substance in red blood cells that carries oxygen. When you do not have enough red blood cells or hemoglobin (are anemic), your body cannot get enough oxygen and your organs may not work properly. As a result, you may feel very tired or have other problems. What are the causes? Common causes of anemia include:  Excessive bleeding. Anemia can be caused by excessive bleeding inside or outside the body, including bleeding from the intestine or from periods in women.  Poor nutrition.  Long-lasting (chronic) kidney, thyroid, and liver disease.  Bone marrow disorders.  Cancer and treatments for cancer.  HIV (human immunodeficiency virus) and AIDS (acquired immunodeficiency syndrome).  Treatments for HIV and AIDS.  Spleen problems.  Blood disorders.  Infections, medicines, and autoimmune disorders that destroy red blood cells.  What are the signs or symptoms? Symptoms of this condition include:  Minor weakness.  Dizziness.  Headache.  Feeling heartbeats that are irregular or faster than normal (palpitations).  Shortness of breath, especially with exercise.  Paleness.  Cold sensitivity.  Indigestion.  Nausea.  Difficulty sleeping.  Difficulty concentrating.  Symptoms may occur suddenly or develop slowly. If your anemia is mild, you may not have symptoms. How is this diagnosed? This condition is diagnosed based on:  Blood tests.  Your medical history.  A physical exam.  Bone marrow biopsy.  Your health care provider may also check your stool (feces) for blood and may do additional testing to look for the cause of your bleeding. You may also have other tests, including:  Imaging tests, such as a CT scan or MRI.  Endoscopy.  Colonoscopy.  How is this treated? Treatment for this condition depends on the cause. If you continue to lose a lot of blood,  you may need to be treated at a hospital. Treatment may include:  Taking supplements of iron, vitamin B12, or folic acid.  Taking a hormone medicine (erythropoietin) that can help to stimulate red blood cell growth.  Having a blood transfusion. This may be needed if you lose a lot of blood.  Making changes to your diet.  Having surgery to remove your spleen.  Follow these instructions at home:  Take over-the-counter and prescription medicines only as told by your health care provider.  Take supplements only as told by your health care provider.  Follow any diet instructions that you were given.  Keep all follow-up visits as told by your health care provider. This is important. Contact a health care provider if:  You develop new bleeding anywhere in the body. Get help right away if:  You are very weak.  You are short of breath.  You have pain in your abdomen or chest.  You are dizzy or feel faint.  You have trouble concentrating.  You have bloody or black, tarry stools.  You vomit repeatedly or you vomit up blood. Summary  Anemia is a condition in which you do not have enough red blood cells or enough of a substance in your red blood cells that carries oxygen (hemoglobin).  Symptoms may occur suddenly or develop slowly.  If your anemia is mild, you may not have symptoms.  This condition is diagnosed with blood tests as well as a medical history and physical exam. Other tests may be needed.  Treatment for this condition depends on the cause of the anemia. This information is not intended to replace advice   given to you by your health care provider. Make sure you discuss any questions you have with your health care provider. Document Released: 03/25/2004 Document Revised: 03/19/2016 Document Reviewed: 03/19/2016 Elsevier Interactive Patient Education  2018 [employer]. Urinary Tract Infection, Adult A urinary tract infection (UTI) is an infection of any part of the  urinary tract. The urinary tract includes the:  Kidneys.  Ureters.  Bladder.  Urethra.  These organs make, store, and get rid of pee (urine) in the body. Follow these instructions at home:  Take over-the-counter and prescription medicines only as told by your doctor.  If you were prescribed an antibiotic medicine, take it as told by your doctor. Do not stop taking the antibiotic even if you start to feel better.  Avoid the following drinks: ? Alcohol. ? Caffeine. ? Tea. ? Carbonated drinks.  Drink enough fluid to keep your pee clear or pale yellow.  Keep all follow-up visits as told by your doctor. This is important.  Make sure to: ? Empty your bladder often and completely. Do not to hold pee for long periods of time. ? Empty your bladder before and after sex. ? Wipe from front to back after a bowel movement if you are male. Use each tissue one time when you wipe. Contact a doctor if:  You have back pain.  You have a fever.  You feel sick to your stomach (nauseous).  You throw up (vomit).  Your symptoms do not get better after 3 days.  Your symptoms go away and then come back. Get help right away if:  You have very bad back pain.  You have very bad lower belly (abdominal) pain.  You are throwing up and cannot keep down any medicines or water. This information is not intended to replace advice given to you by your health care provider. Make sure you discuss any questions you have with your health care provider. Document Released: 08/04/2007 Document Revised: 07/24/2015 Document Reviewed: 01/06/2015 Elsevier Interactive Patient Education  [employer].

## 2018-02-15 ENCOUNTER — Telehealth: Payer: Self-pay | Admitting: Nurse Practitioner

## 2018-02-15 ENCOUNTER — Ambulatory Visit: Payer: PPO | Admitting: Urology

## 2018-02-15 ENCOUNTER — Encounter: Payer: Self-pay | Admitting: Urology

## 2018-02-15 VITALS — BP 129/64 | HR 73 | Ht 71.0 in | Wt 205.2 lb

## 2018-02-15 DIAGNOSIS — Z8551 Personal history of malignant neoplasm of bladder: Secondary | ICD-10-CM | POA: Diagnosis not present

## 2018-02-15 DIAGNOSIS — C679 Malignant neoplasm of bladder, unspecified: Secondary | ICD-10-CM

## 2018-02-15 LAB — ANEMIA PANEL
Ferritin: 40 ng/mL (ref 30–400)
Folate, Hemolysate: 425 ng/mL
Folate, RBC: 1407 ng/mL (ref >498)
Hematocrit: 30.2 % — ABNORMAL LOW (ref 37.5–51.0)
Iron Saturation: 11 % — ABNORMAL LOW (ref 15–55)
Iron: 31 ug/dL — ABNORMAL LOW (ref 38–169)
Retic Ct Pct: 2.1 % (ref 0.6–2.6)
TIBC: 278 ug/dL (ref 250–450)
UIBC: 247 ug/dL (ref 111–343)
Vitamin B-12: 1237 pg/mL (ref 232–1245)

## 2018-02-15 LAB — URINALYSIS, COMPLETE
Bilirubin, UA: NEGATIVE
Glucose, UA: NEGATIVE
Ketones, UA: NEGATIVE
Leukocytes, UA: NEGATIVE
Nitrite, UA: NEGATIVE
Protein, UA: NEGATIVE
RBC, UA: NEGATIVE
Specific Gravity, UA: 1.015 (ref 1.005–1.030)
Urobilinogen, Ur: 0.2 mg/dL (ref 0.2–1.0)
pH, UA: 5 (ref 5.0–7.5)

## 2018-02-15 LAB — CBC WITH DIFFERENTIAL/PLATELET
BASOS ABS: 0 10*3/uL (ref 0.0–0.2)
Basos: 0 %
EOS (ABSOLUTE): 0.3 10*3/uL (ref 0.0–0.4)
Eos: 2 %
HEMOGLOBIN: 9.4 g/dL — AB (ref 13.0–17.7)
IMMATURE GRANULOCYTES: 2 %
Immature Grans (Abs): 0.2 10*3/uL — ABNORMAL HIGH (ref 0.0–0.1)
Lymphocytes Absolute: 1.8 10*3/uL (ref 0.7–3.1)
Lymphs: 16 %
MCH: 24.9 pg — ABNORMAL LOW (ref 26.6–33.0)
MCHC: 31.1 g/dL — ABNORMAL LOW (ref 31.5–35.7)
MCV: 80 fL (ref 79–97)
MONOCYTES: 8 %
Monocytes Absolute: 1 10*3/uL — ABNORMAL HIGH (ref 0.1–0.9)
Neutrophils Absolute: 8 10*3/uL — ABNORMAL HIGH (ref 1.4–7.0)
Neutrophils: 72 %
Platelets: 249 10*3/uL (ref 150–450)
RBC: 3.77 x10E6/uL — ABNORMAL LOW (ref 4.14–5.80)
RDW: 13.9 % (ref 12.3–15.4)
WBC: 11.3 10*3/uL — ABNORMAL HIGH (ref 3.4–10.8)

## 2018-02-15 LAB — MICROSCOPIC EXAMINATION
Bacteria, UA: NONE SEEN
Epithelial Cells (non renal): NONE SEEN /hpf (ref 0–10)
RBC, UA: NONE SEEN /hpf (ref 0–2)
WBC, UA: NONE SEEN /hpf (ref 0–5)

## 2018-02-15 LAB — COMPREHENSIVE METABOLIC PANEL
ALT: 34 IU/L (ref 0–44)
AST: 26 IU/L (ref 0–40)
Albumin/Globulin Ratio: 1.6 (ref 1.2–2.2)
Albumin: 3.8 g/dL (ref 3.5–4.8)
Alkaline Phosphatase: 67 IU/L (ref 39–117)
BUN/Creatinine Ratio: 20 (ref 10–24)
BUN: 22 mg/dL (ref 8–27)
Bilirubin Total: 0.5 mg/dL (ref 0.0–1.2)
CO2: 31 mmol/L — ABNORMAL HIGH (ref 20–29)
Calcium: 8.5 mg/dL — ABNORMAL LOW (ref 8.6–10.2)
Chloride: 98 mmol/L (ref 96–106)
Creatinine, Ser: 1.11 mg/dL (ref 0.76–1.27)
GFR calc Af Amer: 73 mL/min/{1.73_m2} (ref >59)
GFR calc non Af Amer: 63 mL/min/{1.73_m2} (ref >59)
GLUCOSE: 137 mg/dL — AB (ref 65–99)
Globulin, Total: 2.4 g/dL (ref 1.5–4.5)
Potassium: 3.5 mmol/L (ref 3.5–5.2)
Sodium: 146 mmol/L — ABNORMAL HIGH (ref 134–144)
Total Protein: 6.2 g/dL (ref 6.0–8.5)

## 2018-02-15 NOTE — Telephone Encounter (Signed)
Pt returned call to office, please call pt.

## 2018-02-15 NOTE — Telephone Encounter (Signed)
Message relayed to patient. Verbalized understanding and denied questions.   

## 2018-02-15 NOTE — Progress Notes (Signed)
02/15/2018 10:24 AM   John [name] 1941/01/13 [id number]  Referring provider: Venita Lick, NP [address] [place], [place] [address]  Chief Complaint  Patient presents with  . Hospitalization Follow-up    HPI: 77 year old male who underwent surveillance cystoscopy on 02/03/2018 with findings of a recurrent bladder tumor.  His preprocedure urinalysis was normal.  He presented to the ED on 02/07/2018 with symptoms of confusion, unsteadiness and generalized weakness.  Urinalysis did show pyuria and he was started on Keflex and discharged.  He presented to the ED the following day complaining of progressive weakness, fever to 103 degrees and sepsis.  His urine culture grew enterococcus.  Blood cultures were negative.  He responded well to antibiotic therapy and was discharged on 12/13.   PMH: Past Medical History:  Diagnosis Date  . Anemia   . Anginal pain ([facility])   . Aortic stenosis   . 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 Cataracts, bilateral   . CHF (congestive heart failure) ([facility])   . Chronic kidney disease   . Coronary artery disease   . Diabetes mellitus without complication ([facility])    takes Janumet daily...dx approx. 2009  . Dyspnea   . Dysrhythmia   . Heart murmur   . History of colon polyps    benign  . Hyperlipidemia    takes Lovastatin daily  . Hypertension    takes Hyzaar and Amlodipine daily  . Joint pain   . Medical history non-contributory   . Muscle cramps    occasionally in legs   . Osteoarthritis   . Pre-diabetes   . Skin cancer of face   . Urinary urgency     Surgical History: Past Surgical History:  Procedure Laterality Date  . AORTIC VALVE REPLACEMENT N/A 05/05/2017   Procedure: AORTIC VALVE REPLACEMENT (AVR) USING MAGNA EASE PERICARDIAL BIOPROSTHESIS - AORTIC VALVE MODEL 3300TFX, SIZE 23 MM;  Surgeon: Gaye Pollack, MD;  Location: [facility];  Service: Open Heart Surgery;  Laterality: N/A;  . BLADDER SURGERY    . CARDIAC VALVE REPLACEMENT    .  COLONOSCOPY    . CORONARY ARTERY BYPASS GRAFT N/A 05/05/2017   Procedure: CORONARY ARTERY BYPASS GRAFTING (CABG) x 4 WITH ENDOSCOPIC HARVESTING OF RIGHT SAPHENOUS VEIN;  Surgeon: Gaye Pollack, MD;  Location: [facility];  Service: Open Heart Surgery;  Laterality: N/A;  . CYSTOURETHROSCOPY    . JOINT REPLACEMENT Left    hip  . KNEE ARTHROPLASTY Right 01/12/2016   Procedure: RIGHT  TOTAL KNEE ARTHROPLASTY WITH COMPUTER NAVIGATION;  Surgeon: Rod Can, MD;  Location: [facility];  Service: Orthopedics;  Laterality: Right;  Needs RNFA  . NO PAST SURGERIES    . RIGHT/LEFT HEART CATH AND CORONARY ANGIOGRAPHY N/A 04/21/2017   Procedure: RIGHT/LEFT HEART CATH AND CORONARY ANGIOGRAPHY;  Surgeon: Isaias Cowman, MD;  Location: [facility];  Service: Cardiovascular;  Laterality: N/A;  . SKIN SURGERY Left    arm  . TEE WITHOUT CARDIOVERSION N/A 05/05/2017   Procedure: TRANSESOPHAGEAL ECHOCARDIOGRAM (TEE);  Surgeon: Gaye Pollack, MD;  Location: [facility];  Service: Open Heart Surgery;  Laterality: N/A;    Home Medications:  Allergies as of 02/15/2018      Reactions   Aspirin Other (See Comments)   bruising      Medication List       Accurate as of February 15, 2018 10:24 AM. Always use your most recent med list.  acetaminophen 325 MG tablet Commonly known as:  TYLENOL Take 2 tablets (650 mg total) by mouth every 6 (six) hours as needed for mild pain (or Fever >/= 101).   amoxicillin 500 MG capsule Commonly known as:  AMOXIL Take 1 capsule (500 mg total) by mouth 3 (three) times daily for 5 days.   aspirin 81 MG EC tablet Take 1 tablet (81 mg total) by mouth daily.   atorvastatin 40 MG tablet Commonly known as:  LIPITOR Take 1 tablet (40 mg total) by mouth daily at 6 PM.   cholecalciferol 25 MCG (1000 UT) tablet Commonly known as:  VITAMIN D Take 2,000 Units by mouth daily.   furosemide 40 MG tablet Commonly known as:  LASIX Take 0.5 tablets (20 mg total) by mouth  daily.   KLOR-CON M10 10 MEQ tablet Generic drug:  potassium chloride TAKE 1 TABLET BY MOUTH ONCE DAILY   losartan 25 MG tablet Commonly known as:  COZAAR Take 0.5 tablets (12.5 mg total) by mouth daily.   metoprolol tartrate 25 MG tablet Commonly known as:  LOPRESSOR Take 1 tablet (25 mg total) by mouth 2 (two) times daily.   omeprazole 20 MG capsule Commonly known as:  PRILOSEC Take 1 capsule (20 mg total) by mouth daily.   predniSONE 10 MG tablet Commonly known as:  DELTASONE Take 1 tablet (10 mg total) by mouth daily with breakfast for 5 days.   RA VITAMIN B-12 TR 1000 MCG Tbcr Generic drug:  Cyanocobalamin Take 1,000 mcg by mouth daily.   SitaGLIPtin-MetFORMIN HCl 50-1000 MG Tb24 Commonly known as:  JANUMET XR Take 1 tablet by mouth 2 (two) times daily.   vitamin C 1000 MG tablet Take 1,000 mg by mouth daily.       Allergies:  Allergies  Allergen Reactions  . Aspirin Other (See Comments)    bruising    Family History: Family History  Problem Relation Age of Onset  . Dementia Mother   . Stroke Mother   . Stroke Father   . Diabetes Father   . Heart disease Father   . Cancer Sister   . Hypertension Brother   . Cancer Sister     Social History:  reports that he quit smoking about 37 years ago. He quit after 25.00 years of use. He has never used smokeless tobacco. He reports that he does not drink alcohol or use drugs.  ROS: UROLOGY Frequent Urination?: No Hard to postpone urination?: No Burning/pain with urination?: No Get up at night to urinate?: No Leakage of urine?: No Urine stream starts and stops?: No Trouble starting stream?: No Do you have to strain to urinate?: No Blood in urine?: No Urinary tract infection?: No Sexually transmitted disease?: No Injury to kidneys or bladder?: No Painful intercourse?: No Weak stream?: No Erection problems?: No Penile pain?: No  Gastrointestinal Nausea?: No Vomiting?: No Indigestion/heartburn?:  No Diarrhea?: No Constipation?: No  Constitutional Fever: No Night sweats?: No Weight loss?: No Fatigue?: No  Skin Skin rash/lesions?: No Itching?: No  Eyes Blurred vision?: No Double vision?: No  Ears/Nose/Throat Sore throat?: No Sinus problems?: No  Hematologic/Lymphatic Swollen glands?: No Easy bruising?: No  Cardiovascular Leg swelling?: No Chest pain?: No  Respiratory Cough?: No Shortness of breath?: No  Endocrine Excessive thirst?: No  Musculoskeletal Back pain?: No Joint pain?: No  Neurological Headaches?: No Dizziness?: No  Psychologic Depression?: No Anxiety?: No  Physical Exam: BP 129/64 (BP Location: Left Arm, Patient Position: Sitting, Cuff Size: Normal)  Pulse 73   Ht 5\' 11"  (1.803 m)   Wt 205 lb 3.2 oz (93.1 kg)   BMI 28.62 kg/m   Constitutional:  Alert and oriented, No acute distress. HEENT: [place] AT, moist mucus membranes.  Trachea midline, no masses. Cardiovascular: No clubbing, cyanosis, or edema.  RRR Respiratory: Normal respiratory effort, no increased work of breathing.  Lungs clear GI: Abdomen is soft, nontender, nondistended, no abdominal masses GU: No CVA tenderness Lymph: No cervical or inguinal lymphadenopathy. Skin: No rashes, bruises or suspicious lesions. Neurologic: Grossly intact, no focal deficits, moving all 4 extremities. Psychiatric: Normal mood and affect.  Assessment & Plan:   77 year old male with recent sepsis after office cystoscopy.  Urinalysis today is clear.  He will be scheduled for TURBT next month and will need preop urine culture.  Would recommend preprocedure antibiotic prophylaxis for subsequent office cystoscopy.  I again discussed TURBT including potential risks of bleeding, infection, bladder perforation as well as anesthetic risks.  We will plan on post resection gemcitabine instillation.   Abbie Sons, [facility] [address], [place],  [place] [address] [phone]

## 2018-02-15 NOTE — Telephone Encounter (Signed)
General message left for John [name] to call office to go over lab results.  His hemoglobin and hematocrit are improved from hospital discharge, but remain low (at this baseline at this time).  His iron level was low.  I would like him to start taking Ferrous Sulfate 325 MG PO twice a day and continue his Ascorbic Acid.  He can get the Ferrous Sulfate over the counter, if he is unable to find this I will send in script.  Sodium level was slightly elevated, would recommend increasing oral fluid intake (preferably water) -- 240 ml every 4 hours should be goal.  Will recheck labs at his next visit.  His Vitamin B12 level is within normal range, continue daily B12.

## 2018-02-15 NOTE — H&P (View-Only) (Signed)
02/15/2018 10:24 AM   [name] 04-03-1940 [id number]  Referring provider: Venita Lick, NP [address] [place], [place] [address]  Chief Complaint  Patient presents with  . Hospitalization Follow-up    HPI: 77 year old male who underwent surveillance cystoscopy on 02/03/2018 with findings of a recurrent bladder tumor.  His preprocedure urinalysis was normal.  He presented to the ED on 02/07/2018 with symptoms of confusion, unsteadiness and generalized weakness.  Urinalysis did show pyuria and he was started on Keflex and discharged.  He presented to the ED the following day complaining of progressive weakness, fever to 103 degrees and sepsis.  His urine culture grew enterococcus.  Blood cultures were negative.  He responded well to antibiotic therapy and was discharged on 12/13.   PMH: Past Medical History:  Diagnosis Date  . Anemia   . Anginal pain ([facility])   . Aortic stenosis   . Cancer ([facility])    bladder  . Cataracts, bilateral   . CHF (congestive heart failure) ([facility])   . Chronic kidney disease   . Coronary artery disease   . Diabetes mellitus without complication ([facility])    takes Janumet daily...dx approx. 2009  . Dyspnea   . Dysrhythmia   . Heart murmur   . History of colon polyps    benign  . Hyperlipidemia    takes Lovastatin daily  . Hypertension    takes Hyzaar and Amlodipine daily  . Joint pain   . Medical history non-contributory   . Muscle cramps    occasionally in legs   . Osteoarthritis   . Pre-diabetes   . Skin cancer of face   . Urinary urgency     Surgical History: Past Surgical History:  Procedure Laterality Date  . AORTIC VALVE REPLACEMENT N/A 05/05/2017   Procedure: AORTIC VALVE REPLACEMENT (AVR) USING MAGNA EASE PERICARDIAL BIOPROSTHESIS - AORTIC VALVE MODEL 3300TFX, SIZE 23 MM;  Surgeon: Gaye Pollack, MD;  Location: [facility];  Service: Open Heart Surgery;  Laterality: N/A;  . BLADDER SURGERY    . CARDIAC VALVE REPLACEMENT    .  COLONOSCOPY    . CORONARY ARTERY BYPASS GRAFT N/A 05/05/2017   Procedure: CORONARY ARTERY BYPASS GRAFTING (CABG) x 4 WITH ENDOSCOPIC HARVESTING OF RIGHT SAPHENOUS VEIN;  Surgeon: Gaye Pollack, MD;  Location: [facility];  Service: Open Heart Surgery;  Laterality: N/A;  . CYSTOURETHROSCOPY    . JOINT REPLACEMENT Left    hip  . KNEE ARTHROPLASTY Right 01/12/2016   Procedure: RIGHT  TOTAL KNEE ARTHROPLASTY WITH COMPUTER NAVIGATION;  Surgeon: Rod Can, MD;  Location: [facility];  Service: Orthopedics;  Laterality: Right;  Needs RNFA  . NO PAST SURGERIES    . RIGHT/LEFT HEART CATH AND CORONARY ANGIOGRAPHY N/A 04/21/2017   Procedure: RIGHT/LEFT HEART CATH AND CORONARY ANGIOGRAPHY;  Surgeon: Isaias Cowman, MD;  Location: [facility];  Service: Cardiovascular;  Laterality: N/A;  . SKIN SURGERY Left    arm  . TEE WITHOUT CARDIOVERSION N/A 05/05/2017   Procedure: TRANSESOPHAGEAL ECHOCARDIOGRAM (TEE);  Surgeon: Gaye Pollack, MD;  Location: [facility];  Service: Open Heart Surgery;  Laterality: N/A;    Home Medications:  Allergies as of 02/15/2018      Reactions   Aspirin Other (See Comments)   bruising      Medication List       Accurate as of February 15, 2018 10:24 AM. Always use your most recent med list.  acetaminophen 325 MG tablet Commonly known as:  TYLENOL Take 2 tablets (650 mg total) by mouth every 6 (six) hours as needed for mild pain (or Fever >/= 101).   amoxicillin 500 MG capsule Commonly known as:  AMOXIL Take 1 capsule (500 mg total) by mouth 3 (three) times daily for 5 days.   aspirin 81 MG EC tablet Take 1 tablet (81 mg total) by mouth daily.   atorvastatin 40 MG tablet Commonly known as:  LIPITOR Take 1 tablet (40 mg total) by mouth daily at 6 PM.   cholecalciferol 25 MCG (1000 UT) tablet Commonly known as:  VITAMIN D Take 2,000 Units by mouth daily.   furosemide 40 MG tablet Commonly known as:  LASIX Take 0.5 tablets (20 mg total) by mouth  daily.   KLOR-CON M10 10 MEQ tablet Generic drug:  potassium chloride TAKE 1 TABLET BY MOUTH ONCE DAILY   losartan 25 MG tablet Commonly known as:  COZAAR Take 0.5 tablets (12.5 mg total) by mouth daily.   metoprolol tartrate 25 MG tablet Commonly known as:  LOPRESSOR Take 1 tablet (25 mg total) by mouth 2 (two) times daily.   omeprazole 20 MG capsule Commonly known as:  PRILOSEC Take 1 capsule (20 mg total) by mouth daily.   predniSONE 10 MG tablet Commonly known as:  DELTASONE Take 1 tablet (10 mg total) by mouth daily with breakfast for 5 days.   RA VITAMIN B-12 TR 1000 MCG Tbcr Generic drug:  Cyanocobalamin Take 1,000 mcg by mouth daily.   SitaGLIPtin-MetFORMIN HCl 50-1000 MG Tb24 Commonly known as:  JANUMET XR Take 1 tablet by mouth 2 (two) times daily.   vitamin C 1000 MG tablet Take 1,000 mg by mouth daily.       Allergies:  Allergies  Allergen Reactions  . Aspirin Other (See Comments)    bruising    Family History: Family History  Problem Relation Age of Onset  . Dementia Mother   . Stroke Mother   . Stroke Father   . Diabetes Father   . Heart disease Father   . Cancer Sister   . Hypertension Brother   . Cancer Sister     Social History:  reports that he quit smoking about 37 years ago. He quit after 25.00 years of use. He has never used smokeless tobacco. He reports that he does not drink alcohol or use drugs.  ROS: UROLOGY Frequent Urination?: No Hard to postpone urination?: No Burning/pain with urination?: No Get up at night to urinate?: No Leakage of urine?: No Urine stream starts and stops?: No Trouble starting stream?: No Do you have to strain to urinate?: No Blood in urine?: No Urinary tract infection?: No Sexually transmitted disease?: No Injury to kidneys or bladder?: No Painful intercourse?: No Weak stream?: No Erection problems?: No Penile pain?: No  Gastrointestinal Nausea?: No Vomiting?: No Indigestion/heartburn?:  No Diarrhea?: No Constipation?: No  Constitutional Fever: No Night sweats?: No Weight loss?: No Fatigue?: No  Skin Skin rash/lesions?: No Itching?: No  Eyes Blurred vision?: No Double vision?: No  Ears/Nose/Throat Sore throat?: No Sinus problems?: No  Hematologic/Lymphatic Swollen glands?: No Easy bruising?: No  Cardiovascular Leg swelling?: No Chest pain?: No  Respiratory Cough?: No Shortness of breath?: No  Endocrine Excessive thirst?: No  Musculoskeletal Back pain?: No Joint pain?: No  Neurological Headaches?: No Dizziness?: No  Psychologic Depression?: No Anxiety?: No  Physical Exam: BP 129/64 (BP Location: Left Arm, Patient Position: Sitting, Cuff Size: Normal)  Pulse 73   Ht 5\' 11"  (1.803 m)   Wt 205 lb 3.2 oz (93.1 kg)   BMI 28.62 kg/m   Constitutional:  Alert and oriented, No acute distress. HEENT: [place] AT, moist mucus membranes.  Trachea midline, no masses. Cardiovascular: No clubbing, cyanosis, or edema.  RRR Respiratory: Normal respiratory effort, no increased work of breathing.  Lungs clear GI: Abdomen is soft, nontender, nondistended, no abdominal masses GU: No CVA tenderness Lymph: No cervical or inguinal lymphadenopathy. Skin: No rashes, bruises or suspicious lesions. Neurologic: Grossly intact, no focal deficits, moving all 4 extremities. Psychiatric: Normal mood and affect.  Assessment & Plan:   77 year old male with recent sepsis after office cystoscopy.  Urinalysis today is clear.  He will be scheduled for TURBT next month and will need preop urine culture.  Would recommend preprocedure antibiotic prophylaxis for subsequent office cystoscopy.  I again discussed TURBT including potential risks of bleeding, infection, bladder perforation as well as anesthetic risks.  We will plan on post resection gemcitabine instillation.   Abbie Sons, [facility] [address], [place],  [place] [address] [phone]

## 2018-02-16 ENCOUNTER — Other Ambulatory Visit: Payer: PPO | Admitting: Urology

## 2018-02-17 ENCOUNTER — Telehealth: Payer: Self-pay | Admitting: Radiology

## 2018-02-17 ENCOUNTER — Other Ambulatory Visit: Payer: Self-pay | Admitting: Radiology

## 2018-02-17 DIAGNOSIS — D494 Neoplasm of unspecified behavior of bladder: Secondary | ICD-10-CM

## 2018-02-17 DIAGNOSIS — C679 Malignant neoplasm of bladder, unspecified: Secondary | ICD-10-CM

## 2018-02-17 MED ORDER — SODIUM CHLORIDE 0.9 % IR SOLN: 2000.0000 mg | Freq: Once | Status: DC

## 2018-02-17 NOTE — Telephone Encounter (Signed)
Discussed the [facility] Information form below with patient over the phone.  [place], [place], [place] [address] Telephone: [phone] Fax: [phone]   Thank you for choosing [facility] for your upcoming surgery!  We are always here to assist in your urological needs.  Please read the following information with specific details for your upcoming appointments related to your surgery. Please contact [facility] at [phone] Option 3 with any questions.  The Name of Your Surgery: Transurethral resection of bladder tumor with intravesical gemcitabine instillation Your Surgery Date: 03/14/2018 Your Surgeon: John Giovanni  Please call Same Day Surgery at [phone] between the hours of 1pm-3pm one day prior to your surgery. They will inform you of the time to arrive at Same Day Surgery which is located on the second floor of the [facility].   You will receive a call from the [facility] office regarding your appointment with them.  The Pre-Admission Testing office is located at [place], on the first floor of the [facility] in [place] (office is to the right as you enter through the [employer] of the [facility]). Please have all medications you are currently taking and your insurance card available.   Patient was advised to have nothing to eat or drink after midnight the night prior to surgery except that he may have only water until 2 hours before surgery with nothing to drink within 2 hours of surgery.  The patient states he currently takes aspirin 81mg  & was informed to hold medication for 7 days prior to surgery beginning on 03/07/2018. Patient's questions were answered and he expressed understanding of these instructions.

## 2018-02-23 ENCOUNTER — Other Ambulatory Visit: Payer: PPO | Admitting: Urology

## 2018-03-02 ENCOUNTER — Encounter
Admission: RE | Admit: 2018-03-02 | Discharge: 2018-03-02 | Disposition: A | Discharge status: Home / Self Care | Payer: PPO | Source: Ambulatory Visit | Attending: Urology | Admitting: Urology

## 2018-03-02 ENCOUNTER — Other Ambulatory Visit: Payer: Self-pay

## 2018-03-02 DIAGNOSIS — Z01812 Encounter for preprocedural laboratory examination: Secondary | ICD-10-CM | POA: Diagnosis not present

## 2018-03-02 NOTE — Pre-Procedure Instructions (Signed)
EKG 02/07/18 / REQUEST FOR CLEARANCE CALLED AND FAXED TO DR PARASCHOS. ALSO FAXED TO DR STOIOFF

## 2018-03-02 NOTE — Patient Instructions (Signed)
  Your procedure is scheduled on: Tuesday March 14, 2018 Report to Same Day Surgery 2nd floor [facility] Entrance-take elevator on left to 2nd floor.  Check in with surgery information desk.) To find out your arrival time, call [phone] 1:00-3:00 PM on Monday March 13, 2018  Remember: Instructions that are not followed completely may result in serious medical risk, up to and including death, or upon the discretion of your surgeon and anesthesiologist your surgery may need to be rescheduled.    __x__ 1. Do not eat food (including mints, candies, chewing gum) after midnight the night before your procedure. You may drink water up to 2 hours before you are scheduled to arrive at the hospital for your procedure.  Do not drink anything within 2 hours of your scheduled arrival to the hospital.   __x__ 2. No Alcohol for 24 hours before or after surgery.   __x__ 3. No Smoking or e-cigarettes for 24 hours before surgery.  Do not use any chewable tobacco products for at least 6 hours before surgery.   __x__ 4. Notify your doctor if there is any change in your medical condition (cold, fever, infections).   __x__ 5. On the morning of surgery brush your teeth with toothpaste and water.  You may rinse your mouth with mouthwash if you wish.  Do not swallow any toothpaste or mouthwash.   __x__ Use antibacterial soap such as Dial to shower/bathe on the day of surgery.   Do not wear jewelry on the day of surgery.  Do not wear lotions, powders, deodorant, or perfumes.   Do not shave below the face/neck 48 hours prior to surgery.   Do not bring valuables to the hospital.    [facility] is not responsible for any belongings or valuables.               Contacts, dentures or bridgework may not be worn into surgery.  For patients admitted to the hospital, discharge time is determined by your treatment team.  For patients discharged on the day of surgery, you will NOT be permitted to drive  yourself home.  You must have a responsible adult with you for 24 hours after surgery.  __x__ Take these medications with a SMALL SIP OF WATER on the morning of surgery:  1. Metoprolol (Lopressor)  __x__ Stop Janumet 2 days before surgery (Last dose Saturday March 11, 2018).    __x__ Follow recommendations from Cardiologist, Pulmonologist or PCP regarding stopping blood thinners such as Aspirin, Coumadin, Plavix, Eliquis, Effient, Pradaxa, and Pletal.  __x__ At least 7 days before surgery: Stop Anti-inflammatories such as Advil, Ibuprofen, Motrin, Aleve, Naproxen, Naprosyn, BC/Goodies powders or aspirin products. You may continue to take Tylenol and Celebrex.   __x__ TODAY: Stop supplements (Vitamin C) until after surgery. You may continue to take Vitamin D, Vitamin B, and multivitamin.

## 2018-03-03 LAB — URINE CULTURE: Culture: NO GROWTH

## 2018-03-07 ENCOUNTER — Telehealth: Payer: Self-pay

## 2018-03-07 NOTE — Telephone Encounter (Signed)
Left message per DPR regarding negative urine culture results

## 2018-03-07 NOTE — Telephone Encounter (Signed)
-----   Message from Abbie Sons, MD sent at 03/07/2018  8:47 AM EST ----- Urine culture negative for infection

## 2018-03-13 ENCOUNTER — Other Ambulatory Visit: Payer: Self-pay

## 2018-03-13 ENCOUNTER — Encounter: Payer: Self-pay | Admitting: Nurse Practitioner

## 2018-03-13 ENCOUNTER — Ambulatory Visit (INDEPENDENT_AMBULATORY_CARE_PROVIDER_SITE_OTHER): Payer: PPO | Admitting: Nurse Practitioner

## 2018-03-13 VITALS — BP 130/62 | HR 66 | Temp 98.4°F | Ht 71.0 in | Wt 210.0 lb

## 2018-03-13 DIAGNOSIS — D692 Other nonthrombocytopenic purpura: Secondary | ICD-10-CM

## 2018-03-13 DIAGNOSIS — D508 Other iron deficiency anemias: Secondary | ICD-10-CM | POA: Diagnosis not present

## 2018-03-13 DIAGNOSIS — E1122 Type 2 diabetes mellitus with diabetic chronic kidney disease: Secondary | ICD-10-CM

## 2018-03-13 DIAGNOSIS — N183 Chronic kidney disease, stage 3 unspecified: Secondary | ICD-10-CM

## 2018-03-13 DIAGNOSIS — I1310 Hypertensive heart and chronic kidney disease without heart failure, with stage 1 through stage 4 chronic kidney disease, or unspecified chronic kidney disease: Secondary | ICD-10-CM

## 2018-03-13 HISTORY — DX: Other nonthrombocytopenic purpura: D69.2

## 2018-03-13 IMAGING — DX DG KNEE 1-2V PORT*R*
2 series · 2 of 2 positions shown · non-contrast
Comparison: None.

CLINICAL DATA: Status post total knee replacement

EXAM:
PORTABLE RIGHT KNEE - 1-2 VIEW

[knee ap]
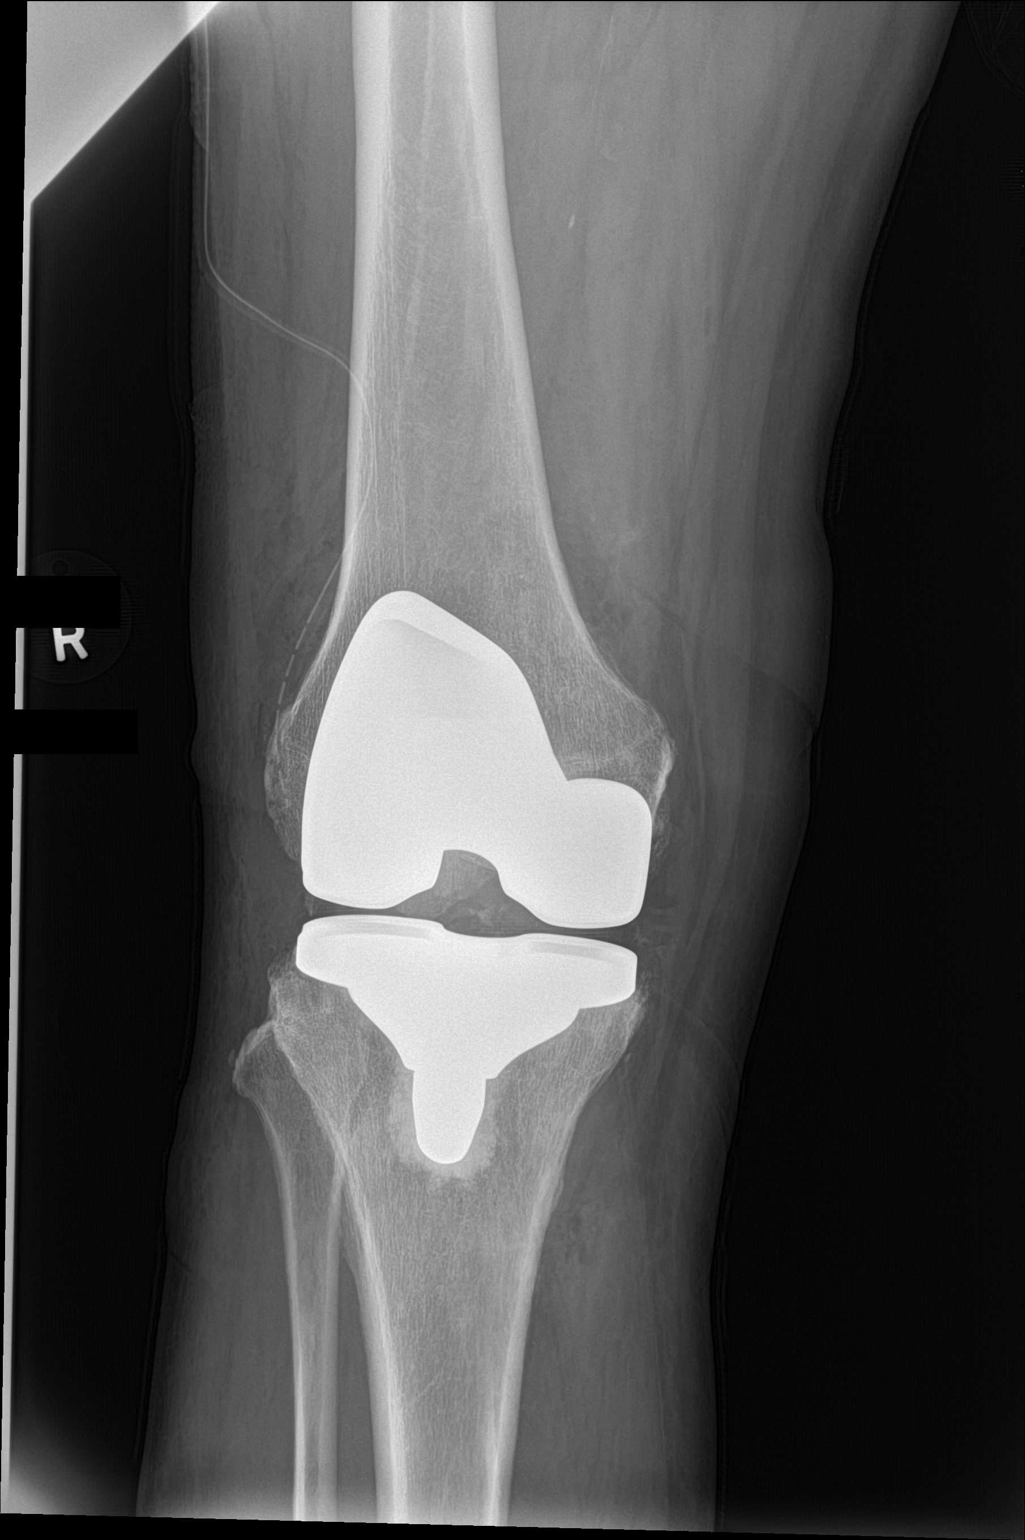

[knee lat]
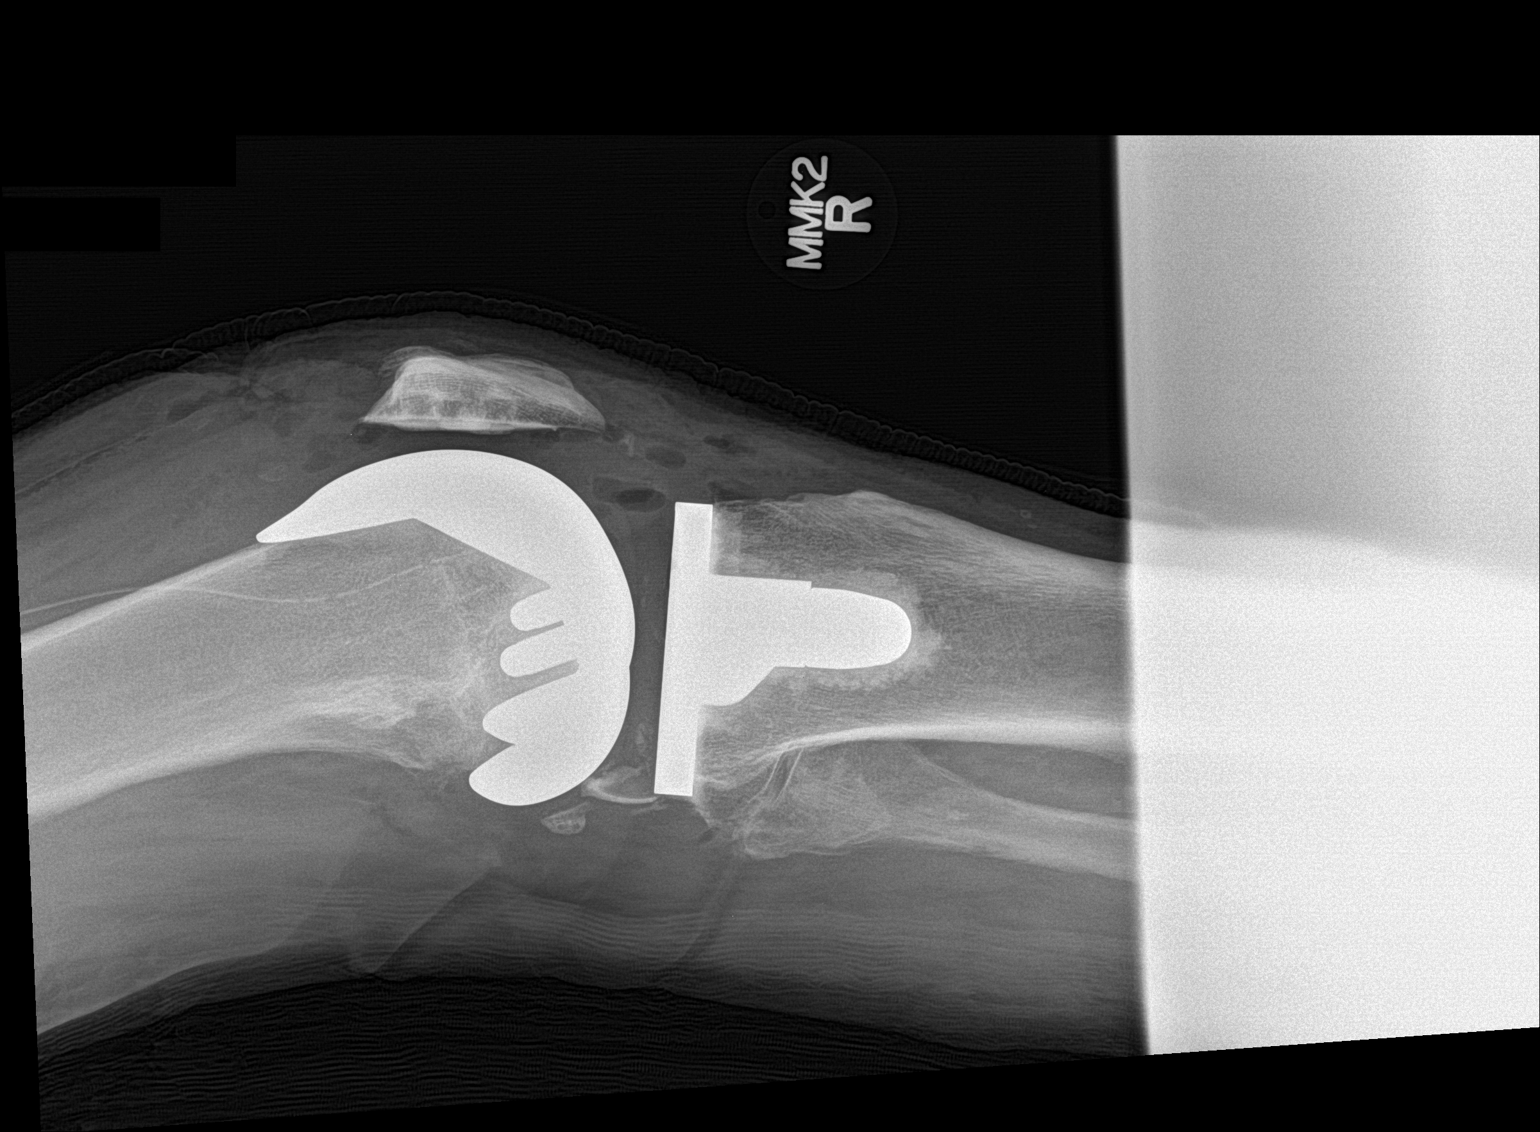

[2 of 2 positions shown; findings below may reference images not displayed]

FINDINGS: Frontal and lateral views were obtained. The patient is status post
total knee replacement with femoral and tibial prosthetic components
well-seated. No fracture or dislocation evident. There is a drain
laterally in the joint. Air in the joint is an expected
postoperative finding.
IMPRESSION: No acute fracture or dislocation. Prosthetic components appear well
seated.

## 2018-03-13 MED ORDER — CEFAZOLIN SODIUM-DEXTROSE 2-4 GM/100ML-% IV SOLN
2.0000 g | INTRAVENOUS | Status: AC
  Administered 2018-03-14: 2 g via INTRAVENOUS

## 2018-03-13 NOTE — Progress Notes (Signed)
BP 130/62 (BP Location: Left Arm, Patient Position: Sitting)   Pulse 66   Temp 98.4 F (36.9 C) (Oral)   Ht 5\' 11"  (1.803 m)   Wt 210 lb (95.3 kg)   SpO2 96%   BMI 29.29 kg/m    Subjective:    Patient ID: John [name], male    DOB: [birth year], 78 y.o.   MRN: [id number]  HPI: John [name] is a 78 y.o. male presents for follow-up  Chief Complaint  Patient presents with  . Diabetes  . Hypertension   DIABETES Last A1C on 02/08/18 was 6.4, next A1C due in March.  Continues on Janumet. Hypoglycemic episodes:no Polydipsia/polyuria: no Visual disturbance: no Chest pain: no Paresthesias: no Glucose Monitoring: no  Accucheck frequency: Not Checking  Fasting glucose:  Post prandial:  Evening:  Before meals: Taking Insulin?: no  Long acting insulin:  Short acting insulin: Blood Pressure Monitoring: not checking Retinal Examination: Up to Date Foot Exam: Up to Date Diabetic Education: Completed Pneumovax: Up to Date Influenza: Up to Date Aspirin: yes   HYPERTENSION Followed by cardiology, Dr. Josefa Half, and last seen 01/23/18.   Hypertension status: controlled  Satisfied with current treatment? no Duration of hypertension: chronic BP monitoring frequency:  not checking BP range:  BP medication side effects:  no Medication compliance: good compliance Aspirin: no Recurrent headaches: no Visual changes: no Palpitations: no Dyspnea: no Chest pain: no Lower extremity edema: no Dizzy/lightheaded: no   ANEMIA At baseline HGB in 9 range for past year.  Last H/H 9.4/26.4, three weeks ago.  On 02/14/18 iron 31 and iron sat 11, was recommended to start Ferrous Sulfate 325 MG twice a day along with Ascorbic Acid and was placed on PPI, as he continues on ASA for overall hear health.  Also takes daily B12 orally, with last level 1237. Anemia status: stable Etiology of anemia: Duration of anemia treatment:  Compliance with treatment: good compliance Iron  supplementation side effects: no Severity of anemia: mild Fatigue: yes, sometimes Decreased exercise tolerance: no  Dyspnea on exertion: no Palpitations: no Bleeding: no Pica: no  Relevant past medical, surgical, family and social history reviewed and updated as indicated. Interim medical history since our last visit reviewed. Allergies and medications reviewed and updated.  Review of Systems  Constitutional: Negative for activity change, diaphoresis, fatigue and fever.  Respiratory: Negative for cough, chest tightness, shortness of breath and wheezing.   Cardiovascular: Negative for chest pain, palpitations and leg swelling.  Gastrointestinal: Negative for abdominal distention, abdominal pain, constipation, diarrhea, nausea and vomiting.  Endocrine: Negative for cold intolerance, heat intolerance, polydipsia, polyphagia and polyuria.  Musculoskeletal: Negative.   Skin: Negative.   Neurological: Negative for dizziness, syncope, weakness, light-headedness, numbness and headaches.  Psychiatric/Behavioral: Negative.     Per HPI unless specifically indicated above     Objective:    BP 130/62 (BP Location: Left Arm, Patient Position: Sitting)   Pulse 66   Temp 98.4 F (36.9 C) (Oral)   Ht 5\' 11"  (1.803 m)   Wt 210 lb (95.3 kg)   SpO2 96%   BMI 29.29 kg/m   Wt Readings from Last 3 Encounters:  03/13/18 210 lb (95.3 kg)  03/02/18 210 lb (95.3 kg)  02/15/18 205 lb 3.2 oz (93.1 kg)    Physical Exam Vitals signs and nursing note reviewed.  Constitutional:      Appearance: He is well-developed.  HENT:     Head: Normocephalic and atraumatic.     Right  Ear: Hearing normal. No drainage.     Left Ear: Hearing normal. No drainage.     Mouth/Throat:     Dentition: Abnormal dentition (poor dentition).     Pharynx: Uvula midline.  Eyes:     General: Lids are normal.        Right eye: No discharge.        Left eye: No discharge.     Conjunctiva/sclera: Conjunctivae normal.      Pupils: Pupils are equal, round, and reactive to light.  Neck:     Musculoskeletal: Normal range of motion and neck supple.     Thyroid: No thyromegaly.     Vascular: No carotid bruit or JVD.     Trachea: Trachea normal.  Cardiovascular:     Rate and Rhythm: Normal rate and regular rhythm.     Heart sounds: S1 normal and S2 normal. Murmur present. Systolic murmur present with a grade of 2/6. No gallop.   Pulmonary:     Effort: Pulmonary effort is normal.     Breath sounds: Normal breath sounds.  Abdominal:     General: Bowel sounds are normal.     Palpations: Abdomen is soft. There is no hepatomegaly or splenomegaly.  Musculoskeletal: Normal range of motion.     Right lower leg: Edema (trace) present.     Left lower leg: Edema (trace) present.  Skin:    General: Skin is warm and dry.     Capillary Refill: Capillary refill takes less than 2 seconds.     Findings: No rash.     Comments: Scattered pale purple to yellow bruising to bilateral upper extremities.    Neurological:     Mental Status: He is alert and oriented to person, place, and time.     Deep Tendon Reflexes: Reflexes are normal and symmetric.  Psychiatric:        Mood and Affect: Mood normal.        Behavior: Behavior normal.        Thought Content: Thought content normal.        Judgment: Judgment normal.     Results for orders placed or performed during the hospital encounter of 03/02/18  Urine culture  Result Value Ref Range   Specimen Description      URINE, CLEAN CATCH Performed at [facility], [address], [place], [place] [address]    Special Requests      NONE Performed at [facility], [address], [place], [place] [address]    Culture      NO GROWTH Performed at [facility], [place] [address], [place], [place] [address]    Report Status 03/03/2018 FINAL       Assessment & Plan:   Problem List Items Addressed This Visit      Cardiovascular and Mediastinum    Hypertensive heart/kidney disease without HF and with CKD stage III (HCC)    Chronic, ongoing.  Initial BP slightly elevated today with recheck below goal.  Losartan restarted at 12.5 MG daily at last visit, after hospitalization.  Check BMP next visit.      Senile purpura ([place])    As evidenced by scattered bruises bilaterall upper extremities.  Gentle skin cleansers at home and lotion.  Monitor for skin breakdown.        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  Return in February for A1C and BMP.        Other   Iron deficiency anemia    Chronic, HGB at baseline past year in the 9's.  Continue B12 supplement and Ferrous Sulfate BID + Prilosec for GI protection.  Anemia panel recheck next visit.           Follow up plan: Return in about 2 months (around 05/12/2018) for T2DM and HTN/HLD (will need labs).

## 2018-03-13 NOTE — Assessment & Plan Note (Addendum)
Chronic, HGB at baseline past year in the 9's.  Continue B12 supplement and Ferrous Sulfate BID + Prilosec for GI protection.  Anemia panel recheck next visit.

## 2018-03-13 NOTE — Assessment & Plan Note (Addendum)
Chronic, ongoing.  Initial BP slightly elevated today with recheck below goal.  Losartan restarted at 12.5 MG daily at last visit, after hospitalization.  Check BMP next visit.

## 2018-03-13 NOTE — Patient Instructions (Signed)
Anemia  Anemia is a condition in which you do not have enough red blood cells or hemoglobin. Hemoglobin is a substance in red blood cells that carries oxygen. When you do not have enough red blood cells or hemoglobin (are anemic), your body cannot get enough oxygen and your organs may not work properly. As a result, you may feel very tired or have other problems. What are the causes? Common causes of anemia include:  Excessive bleeding. Anemia can be caused by excessive bleeding inside or outside the body, including bleeding from the intestine or from periods in women.  Poor nutrition.  Long-lasting (chronic) kidney, thyroid, and liver disease.  Bone marrow disorders.  Cancer and treatments for cancer.  HIV (human immunodeficiency virus) and AIDS (acquired immunodeficiency syndrome).  Treatments for HIV and AIDS.  Spleen problems.  Blood disorders.  Infections, medicines, and autoimmune disorders that destroy red blood cells. What are the signs or symptoms? Symptoms of this condition include:  Minor weakness.  Dizziness.  Headache.  Feeling heartbeats that are irregular or faster than normal (palpitations).  Shortness of breath, especially with exercise.  Paleness.  Cold sensitivity.  Indigestion.  Nausea.  Difficulty sleeping.  Difficulty concentrating. Symptoms may occur suddenly or develop slowly. If your anemia is mild, you may not have symptoms. How is this diagnosed? This condition is diagnosed based on:  Blood tests.  Your medical history.  A physical exam.  Bone marrow biopsy. Your health care provider may also check your stool (feces) for blood and may do additional testing to look for the cause of your bleeding. You may also have other tests, including:  Imaging tests, such as a CT scan or MRI.  Endoscopy.  Colonoscopy. How is this treated? Treatment for this condition depends on the cause. If you continue to lose a lot of blood, you may  need to be treated at a hospital. Treatment may include:  Taking supplements of iron, vitamin S31, or folic acid.  Taking a hormone medicine (erythropoietin) that can help to stimulate red blood cell growth.  Having a blood transfusion. This may be needed if you lose a lot of blood.  Making changes to your diet.  Having surgery to remove your spleen. Follow these instructions at home:  Take over-the-counter and prescription medicines only as told by your health care provider.  Take supplements only as told by your health care provider.  Follow any diet instructions that you were given.  Keep all follow-up visits as told by your health care provider. This is important. Contact a health care provider if:  You develop new bleeding anywhere in the body. Get help right away if:  You are very weak.  You are short of breath.  You have pain in your abdomen or chest.  You are dizzy or feel faint.  You have trouble concentrating.  You have bloody or black, tarry stools.  You vomit repeatedly or you vomit up blood. Summary  Anemia is a condition in which you do not have enough red blood cells or enough of a substance in your red blood cells that carries oxygen (hemoglobin).  Symptoms may occur suddenly or develop slowly.  If your anemia is mild, you may not have symptoms.  This condition is diagnosed with blood tests as well as a medical history and physical exam. Other tests may be needed.  Treatment for this condition depends on the cause of the anemia. This information is not intended to replace advice given to you by  your health care provider. Make sure you discuss any questions you have with your health care provider. Document Released: 03/25/2004 Document Revised: 03/19/2016 Document Reviewed: 03/19/2016 Elsevier Interactive Patient Education  2019 [employer].

## 2018-03-13 NOTE — Assessment & Plan Note (Signed)
As evidenced by scattered bruises bilaterall upper extremities.  Gentle skin cleansers at home and lotion.  Monitor for skin breakdown.

## 2018-03-13 NOTE — Assessment & Plan Note (Addendum)
A1C in December 2019 was 6.4%, continue current medication regimen.  Return in February for A1C and BMP.

## 2018-03-14 ENCOUNTER — Ambulatory Visit
Admission: RE | Admit: 2018-03-14 | Discharge: 2018-03-14 | Disposition: A | Discharge status: Home / Self Care | Payer: PPO | Attending: Urology | Admitting: Urology

## 2018-03-14 ENCOUNTER — Encounter: Payer: Self-pay | Admitting: *Deleted

## 2018-03-14 ENCOUNTER — Ambulatory Visit: Payer: PPO | Admitting: Anesthesiology

## 2018-03-14 ENCOUNTER — Encounter
Admission: RE | Disposition: A | Discharge status: Home / Self Care | Payer: Self-pay | Source: Home / Self Care | Attending: Urology

## 2018-03-14 DIAGNOSIS — Z96642 Presence of left artificial hip joint: Secondary | ICD-10-CM | POA: Insufficient documentation

## 2018-03-14 DIAGNOSIS — Z79899 Other long term (current) drug therapy: Secondary | ICD-10-CM | POA: Diagnosis not present

## 2018-03-14 DIAGNOSIS — Z96651 Presence of right artificial knee joint: Secondary | ICD-10-CM | POA: Insufficient documentation

## 2018-03-14 DIAGNOSIS — I2510 Atherosclerotic heart disease of native coronary artery without angina pectoris: Secondary | ICD-10-CM | POA: Insufficient documentation

## 2018-03-14 DIAGNOSIS — D494 Neoplasm of unspecified behavior of bladder: Secondary | ICD-10-CM

## 2018-03-14 DIAGNOSIS — E1122 Type 2 diabetes mellitus with diabetic chronic kidney disease: Secondary | ICD-10-CM | POA: Diagnosis not present

## 2018-03-14 DIAGNOSIS — I509 Heart failure, unspecified: Secondary | ICD-10-CM | POA: Insufficient documentation

## 2018-03-14 DIAGNOSIS — Z7984 Long term (current) use of oral hypoglycemic drugs: Secondary | ICD-10-CM | POA: Diagnosis not present

## 2018-03-14 DIAGNOSIS — N189 Chronic kidney disease, unspecified: Secondary | ICD-10-CM | POA: Diagnosis not present

## 2018-03-14 DIAGNOSIS — Z951 Presence of aortocoronary bypass graft: Secondary | ICD-10-CM | POA: Diagnosis not present

## 2018-03-14 DIAGNOSIS — Z8551 Personal history of malignant neoplasm of bladder: Secondary | ICD-10-CM | POA: Diagnosis not present

## 2018-03-14 DIAGNOSIS — Z87891 Personal history of nicotine dependence: Secondary | ICD-10-CM | POA: Insufficient documentation

## 2018-03-14 DIAGNOSIS — Z7982 Long term (current) use of aspirin: Secondary | ICD-10-CM | POA: Diagnosis not present

## 2018-03-14 DIAGNOSIS — I130 Hypertensive heart and chronic kidney disease with heart failure and stage 1 through stage 4 chronic kidney disease, or unspecified chronic kidney disease: Secondary | ICD-10-CM | POA: Diagnosis not present

## 2018-03-14 DIAGNOSIS — C674 Malignant neoplasm of posterior wall of bladder: Secondary | ICD-10-CM | POA: Diagnosis not present

## 2018-03-14 DIAGNOSIS — C679 Malignant neoplasm of bladder, unspecified: Secondary | ICD-10-CM

## 2018-03-14 DIAGNOSIS — Z952 Presence of prosthetic heart valve: Secondary | ICD-10-CM | POA: Diagnosis not present

## 2018-03-14 DIAGNOSIS — D414 Neoplasm of uncertain behavior of bladder: Secondary | ICD-10-CM | POA: Diagnosis not present

## 2018-03-14 DIAGNOSIS — E785 Hyperlipidemia, unspecified: Secondary | ICD-10-CM | POA: Diagnosis not present

## 2018-03-14 DIAGNOSIS — N183 Chronic kidney disease, stage 3 (moderate): Secondary | ICD-10-CM | POA: Diagnosis not present

## 2018-03-14 HISTORY — PX: TRANSURETHRAL RESECTION OF BLADDER TUMOR WITH MITOMYCIN-C: SHX6459

## 2018-03-14 LAB — GLUCOSE, CAPILLARY
GLUCOSE-CAPILLARY: 116 mg/dL — AB (ref 70–99)
Glucose-Capillary: 123 mg/dL — ABNORMAL HIGH (ref 70–99)

## 2018-03-14 SURGERY — TRANSURETHRAL RESECTION OF BLADDER TUMOR WITH MITOMYCIN-C
Anesthesia: General

## 2018-03-14 MED ORDER — GEMCITABINE CHEMO FOR BLADDER INSTILLATION 2000 MG
INTRAVENOUS | Status: DC | PRN
  Administered 2018-03-14: 2000 mg via INTRAVESICAL

## 2018-03-14 MED ORDER — LIDOCAINE HCL (PF) 2 % IJ SOLN
INTRAMUSCULAR | Status: AC
  Filled 2018-03-14: qty 10

## 2018-03-14 MED ORDER — MIDAZOLAM HCL 2 MG/2ML IJ SOLN
INTRAMUSCULAR | Status: AC
  Filled 2018-03-14: qty 2

## 2018-03-14 MED ORDER — SODIUM CHLORIDE 0.9 % IV SOLN
INTRAVENOUS | Status: DC
  Administered 2018-03-14: 11:00:00 via INTRAVENOUS

## 2018-03-14 MED ORDER — MIDAZOLAM HCL 2 MG/2ML IJ SOLN
INTRAMUSCULAR | Status: DC | PRN
  Administered 2018-03-14: 1 mg via INTRAVENOUS

## 2018-03-14 MED ORDER — OXYCODONE HCL 5 MG/5ML PO SOLN: 5.0000 mg | Freq: Once | ORAL | Status: DC | PRN

## 2018-03-14 MED ORDER — LIDOCAINE HCL (CARDIAC) PF 100 MG/5ML IV SOSY
PREFILLED_SYRINGE | INTRAVENOUS | Status: DC | PRN
  Administered 2018-03-14: 80 mg via INTRAVENOUS

## 2018-03-14 MED ORDER — ONDANSETRON HCL 4 MG/2ML IJ SOLN
INTRAMUSCULAR | Status: AC
  Filled 2018-03-14: qty 2

## 2018-03-14 MED ORDER — FENTANYL CITRATE (PF) 100 MCG/2ML IJ SOLN: 25.0000 ug | INTRAMUSCULAR | Status: DC | PRN

## 2018-03-14 MED ORDER — ONDANSETRON HCL 4 MG/2ML IJ SOLN
INTRAMUSCULAR | Status: DC | PRN
  Administered 2018-03-14: 4 mg via INTRAVENOUS

## 2018-03-14 MED ORDER — FAMOTIDINE 20 MG PO TABS
20.0000 mg | ORAL_TABLET | Freq: Once | ORAL | Status: AC
  Administered 2018-03-14: 20 mg via ORAL

## 2018-03-14 MED ORDER — PROMETHAZINE HCL 25 MG/ML IJ SOLN: 6.2500 mg | INTRAMUSCULAR | Status: DC | PRN

## 2018-03-14 MED ORDER — CEFAZOLIN SODIUM-DEXTROSE 2-4 GM/100ML-% IV SOLN
INTRAVENOUS | Status: AC
  Filled 2018-03-14: qty 100

## 2018-03-14 MED ORDER — OXYCODONE HCL 5 MG PO TABS: 5.0000 mg | ORAL_TABLET | Freq: Once | ORAL | Status: DC | PRN

## 2018-03-14 MED ORDER — PHENYLEPHRINE HCL 10 MG/ML IJ SOLN
INTRAMUSCULAR | Status: DC | PRN
  Administered 2018-03-14 (×2): 150 ug via INTRAVENOUS

## 2018-03-14 MED ORDER — MEPERIDINE HCL 50 MG/ML IJ SOLN: 6.2500 mg | INTRAMUSCULAR | Status: DC | PRN

## 2018-03-14 MED ORDER — SULFAMETHOXAZOLE-TRIMETHOPRIM 800-160 MG PO TABS: 1.0000 | ORAL_TABLET | Freq: Two times a day (BID) | ORAL | 0 refills | Status: AC

## 2018-03-14 MED ORDER — FENTANYL CITRATE (PF) 100 MCG/2ML IJ SOLN
INTRAMUSCULAR | Status: DC | PRN
  Administered 2018-03-14 (×2): 25 ug via INTRAVENOUS

## 2018-03-14 MED ORDER — PROPOFOL 10 MG/ML IV BOLUS
INTRAVENOUS | Status: DC | PRN
  Administered 2018-03-14: 140 mg via INTRAVENOUS

## 2018-03-14 MED ORDER — FAMOTIDINE 20 MG PO TABS
ORAL_TABLET | ORAL | Status: AC
  Filled 2018-03-14: qty 1

## 2018-03-14 MED ORDER — PROPOFOL 10 MG/ML IV BOLUS
INTRAVENOUS | Status: AC
  Filled 2018-03-14: qty 20

## 2018-03-14 MED ORDER — FENTANYL CITRATE (PF) 100 MCG/2ML IJ SOLN
INTRAMUSCULAR | Status: AC
  Filled 2018-03-14: qty 2

## 2018-03-14 SURGICAL SUPPLY — 21 items
BAG DRAIN CYSTO-URO LG1000N (MISCELLANEOUS) ×3 IMPLANT
BAG URINE DRAINAGE (UROLOGICAL SUPPLIES) ×3 IMPLANT
CATH FOLEY 2WAY  5CC 16FR (CATHETERS) ×2
CATH URTH 16FR FL 2W BLN LF (CATHETERS) IMPLANT
DRAPE UTILITY 15X26 TOWEL STRL (DRAPES) ×3 IMPLANT
DRSG TELFA 4X3 1S NADH ST (GAUZE/BANDAGES/DRESSINGS) ×3 IMPLANT
ELECT LOOP 22F BIPOLAR SML (ELECTROSURGICAL) ×3
ELECT REM PT RETURN 9FT ADLT (ELECTROSURGICAL)
ELECTRODE LOOP 22F BIPOLAR SML (ELECTROSURGICAL) IMPLANT
ELECTRODE REM PT RTRN 9FT ADLT (ELECTROSURGICAL) IMPLANT
GLOVE BIO SURGEON STRL SZ8 (GLOVE) ×3 IMPLANT
GOWN STRL REUS W/ TWL LRG LVL3 (GOWN DISPOSABLE) ×1 IMPLANT
GOWN STRL REUS W/TWL LRG LVL3 (GOWN DISPOSABLE) ×2
GOWN STRL REUS W/TWL XL LVL4 (GOWN DISPOSABLE) ×3 IMPLANT
KIT TURNOVER CYSTO (KITS) ×3 IMPLANT
LOOP CUT BIPOLAR 24F LRG (ELECTROSURGICAL) IMPLANT
PACK CYSTO AR (MISCELLANEOUS) ×3 IMPLANT
SET IRRIG Y TYPE TUR BLADDER L (SET/KITS/TRAYS/PACK) ×3 IMPLANT
SURGILUBE 2OZ TUBE FLIPTOP (MISCELLANEOUS) ×3 IMPLANT
SYRINGE IRR TOOMEY STRL 70CC (SYRINGE) ×3 IMPLANT
WATER STERILE IRR 1000ML POUR (IV SOLUTION) ×3 IMPLANT

## 2018-03-14 NOTE — Interval H&P Note (Signed)
History and Physical Interval Note:  03/14/2018 11:48 AM  John [name]  has presented today for surgery, with the diagnosis of bladder tumor, recurrent  The various methods of treatment have been discussed with the patient and family. After consideration of risks, benefits and other options for treatment, the patient has consented to  Procedure(s): TRANSURETHRAL RESECTION OF BLADDER TUMOR WITH Gemcitabine (N/A) as a surgical intervention .  The patient's history has been reviewed, patient examined, no change in status, stable for surgery.  I have reviewed the patient's chart and labs.  Questions were answered to the patient's satisfaction.     [place]

## 2018-03-14 NOTE — Anesthesia Preprocedure Evaluation (Signed)
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 Wall   thickness was normal. Systolic function was mildly to moderately   reduced. The estimated ejection fraction was in the range of 40%   to 45%. Regional wall motion abnormalities cannot be excluded.   The study is not technically sufficient to allow evaluation of LV   diastolic function. - Mitral valve: There was mild regurgitation. - Right ventricle: The cavity size was mildly dilated. Wall   thickness was normal.   Neuro/Psych neg Seizures negative neurological ROS  negative psych ROS   GI/Hepatic negative GI ROS, Neg liver ROS,   Endo/Other  diabetes, Oral Hypoglycemic Agents  Renal/GU Renal InsufficiencyRenal disease     Musculoskeletal  (+) Arthritis ,   Abdominal (+) - obese,   Peds  Hematology  (+) anemia ,   Anesthesia Other Findings Past Medical History: No date: Anemia No date: Anginal pain ([place]) No date: Aortic stenosis No date: Cancer [facility])     Comment:  bladder No date: Cataracts, bilateral No date: CHF (congestive heart failure) (HCC) No date: Chronic kidney disease No date: Coronary artery disease No date: Diabetes mellitus  without complication (HCC)     Comment:  takes Janumet daily...dx approx. 2009 No date: Dyspnea No date: Dysrhythmia No date: Heart murmur No date: History of colon polyps     Comment:  benign No date: Hyperlipidemia     Comment:  takes Lovastatin daily No date: Hypertension     Comment:  takes Hyzaar and Amlodipine daily No date: Joint pain No date: Medical history non-contributory No date: Muscle cramps     Comment:  occasionally in legs  No date: Osteoarthritis No date: Pre-diabetes 03/13/2018: Senile purpura (HCC) No date: Skin cancer of face No date: Urinary urgency   Reproductive/Obstetrics                             Anesthesia Physical Anesthesia Plan  ASA: III  Anesthesia Plan: General   Post-op Pain Management:    Induction: Intravenous  PONV Risk Score and Plan: 1 and Ondansetron  Airway Management Planned: LMA  Additional Equipment:   Intra-op Plan:   Post-operative Plan:   Informed Consent: I have reviewed the patients History and Physical, chart, labs and discussed the procedure including the risks, benefits and alternatives for the proposed anesthesia with the patient or authorized representative who has indicated his/her understanding and acceptance.     Dental advisory given  Plan Discussed with: CRNA and Anesthesiologist  Anesthesia Plan Comments:         Anesthesia Quick Evaluation

## 2018-03-14 NOTE — Transfer of Care (Signed)
Immediate Anesthesia Transfer of Care Note  Patient: John [name]  Procedure(s) Performed: TRANSURETHRAL RESECTION OF BLADDER TUMOR WITH Gemcitabine (N/A )  Patient Location: PACU  Anesthesia Type:General  Level of Consciousness: drowsy  Airway & Oxygen Therapy: Patient Spontanous Breathing and Patient connected to face mask oxygen  Post-op Assessment: Report given to RN and Post -op Vital signs reviewed and stable  Post vital signs: stable  Last Vitals:  Vitals Value Taken Time  BP 148/69 03/14/2018  1:01 PM  Temp 36.8 C 03/14/2018  1:00 PM  Pulse 71 03/14/2018  1:06 PM  Resp 24 03/14/2018  1:06 PM  SpO2 100 % 03/14/2018  1:06 PM  Vitals shown include unvalidated device data.  Last Pain:  Vitals:   03/14/18 1300  TempSrc:   PainSc: Asleep         Complications: No apparent anesthesia complications

## 2018-03-14 NOTE — Discharge Instructions (Signed)

## 2018-03-14 NOTE — Anesthesia Post-op Follow-up Note (Signed)
Anesthesia QCDR form completed.        

## 2018-03-14 NOTE — Anesthesia Postprocedure Evaluation (Signed)
Anesthesia Post Note  Patient: DEONTAYE CIVELLO  Procedure(s) Performed: TRANSURETHRAL RESECTION OF BLADDER TUMOR WITH Gemcitabine (N/A )  Patient location during evaluation: PACU Anesthesia Type: General Level of consciousness: awake and alert and oriented Pain management: pain level controlled Vital Signs Assessment: post-procedure vital signs reviewed and stable Respiratory status: spontaneous breathing, nonlabored ventilation and respiratory function stable Cardiovascular status: blood pressure returned to baseline and stable Postop Assessment: no signs of nausea or vomiting Anesthetic complications: no     Last Vitals:  Vitals:   03/14/18 1347 03/14/18 1430  BP: (!) 146/75 (!) 154/57  Pulse:  69  Resp: 18 16  Temp: 37.2 C (!) 36.3 C  SpO2:  97%    Last Pain:  Vitals:   03/14/18 1430  TempSrc: Temporal  PainSc: 0-No pain                 Ronson Hagins

## 2018-03-14 NOTE — Anesthesia Procedure Notes (Signed)
Procedure Name: LMA Insertion Date/Time: 03/14/2018 12:18 PM Performed by: Lavone Orn, CRNA Pre-anesthesia Checklist: Patient identified, Emergency Drugs available, Suction available, Patient being monitored and Timeout performed Patient Re-evaluated:Patient Re-evaluated prior to induction Oxygen Delivery Method: Circle system utilized Preoxygenation: Pre-oxygenation with 100% oxygen Induction Type: IV induction Ventilation: Mask ventilation without difficulty LMA: LMA inserted LMA Size: 4.5 Number of attempts: 1 Placement Confirmation: positive ETCO2 Tube secured with: Tape Dental Injury: Teeth and Oropharynx as per pre-operative assessment

## 2018-03-15 ENCOUNTER — Encounter: Payer: Self-pay | Admitting: Urology

## 2018-03-15 LAB — SURGICAL PATHOLOGY

## 2018-03-15 NOTE — Op Note (Signed)
Preoperative diagnosis: 1. Bladder tumor (2 cm)  Postoperative diagnosis:  1. Bladder tumor (2 cm)  Procedure:  1. Cystoscopy 2. Transurethral resection of bladder tumor (2-5 cm) 3. Instillation intravesical gemcitabine  Surgeon: Nicki Reaper C. Auriella Wieand, M.D.  Anesthesia: General  Complications: None  Intraoperative findings:  1. Bladder tumor: Broad-based papillary tumor right posterior wall   EBL: Minimal  Specimens: 1. Bladder tumor      Indication: RAYMAR JOINER is a with a history of recurrent urothelial carcinoma the bladder.  Last recurrence was a low-grade tumor in January 2016.  Recent surveillance cystoscopy showed an area of papillary tumor on the right posterior wall. After reviewing the management options for treatment, he elected to proceed with the above surgical procedure(s). We have discussed the potential benefits and risks of the procedure, side effects of the proposed treatment, the likelihood of the patient achieving the goals of the procedure, and any potential problems that might occur during the procedure or recuperation. Informed consent has been obtained.  Description of procedure:  The patient was taken to the operating room and general anesthesia was induced.  The patient was placed in the dorsal lithotomy position, prepped and draped in the usual sterile fashion, and preoperative antibiotics were administered. A preoperative time-out was performed.   Cystourethroscopy was performed.  The patient's urethra was examined and was normal/ demonstrated moderate bilobar prostatic hypertrophy.   The bladder was then systematically examined in its entirety.  An area of broad-based papillary tumor was again noted on the right posterior wall measuring 2-2.5 cm.  The left ureteral orifice was normal in appearance.  The reimplanted right ureteral orifice was free of lesions.  A 26 French continuous-flow resectoscope sheath with visual obturator was lubricated and  passed under direct vision.  The bladder was then re-examined after the resectoscope was placed.  Using loop cautery resection, the entire tumor was resected and removed for permanent pathologic analysis.   Hemostasis was then achieved with the loop cautery and the bladder was emptied and reinspected with no further bleeding noted at the end of the procedure.    The bladder was then emptied and the procedure ended.  A 16 French Foley catheter was placed with return of clear effluent.  The catheter was placed to close drainage and the catheter tubing was doubly clamped with hemostats.  2000 mg of gemcitabine was then instilled through the closed system.  After anesthetic reversal the patient was transported to PACU in stable condition.  Plan: Gemcitabine dwell time will be 1 hour and his catheter will be removed prior to discharge.   Kynley Metzger C. Bernardo Heater,  MD

## 2018-03-31 ENCOUNTER — Ambulatory Visit (INDEPENDENT_AMBULATORY_CARE_PROVIDER_SITE_OTHER): Payer: PPO | Admitting: Urology

## 2018-03-31 ENCOUNTER — Encounter: Payer: Self-pay | Admitting: Urology

## 2018-03-31 VITALS — BP 173/65 | HR 103 | Ht 71.0 in | Wt 211.0 lb

## 2018-03-31 DIAGNOSIS — C679 Malignant neoplasm of bladder, unspecified: Secondary | ICD-10-CM | POA: Diagnosis not present

## 2018-03-31 NOTE — Progress Notes (Signed)
03/31/2018 1:10 PM   John [name] [birth year] [id number]  Referring provider: Venita Lick, NP [address] [place], [place] [address]  Chief Complaint  Patient presents with  . Bladder Cancer    HPI: 78 year old male presents for postop follow-up.  He is status post TURBT  03/14/2018 for a 2 cm posterior wall bladder tumor.  He received post resection gemcitabine.  He had no post procedure complaints.  Pathology remarkable for a low-grade, noninvasive papillary urothelial carcinoma.  Muscle was present and uninvolved.  He had urinary frequency, urgency and dysuria for 10 days after the procedure but states it has significantly improved and has almost resolved.   PMH: Past Medical History:  Diagnosis Date  . Anemia   . Anginal pain ([place])   . Aortic stenosis   . Cancer ([place])    bladder  . Cataracts, bilateral   . CHF (congestive heart failure) ([place])   . Chronic kidney disease   . Coronary artery disease   . Diabetes mellitus without complication ([place])    takes Janumet daily...dx approx. 2009  . Dyspnea   . Dysrhythmia   . Heart murmur   . History of colon polyps    benign  . Hyperlipidemia    takes Lovastatin daily  . Hypertension    takes Hyzaar and Amlodipine daily  . Joint pain   . Medical history non-contributory   . Muscle cramps    occasionally in legs   . Osteoarthritis   . Pre-diabetes   . Senile purpura ([place]) 03/13/2018  . Skin cancer of face   . Urinary urgency     Surgical History: Past Surgical History:  Procedure Laterality Date  . AORTIC VALVE REPLACEMENT N/A 05/05/2017   Procedure: AORTIC VALVE REPLACEMENT (AVR) USING MAGNA EASE PERICARDIAL BIOPROSTHESIS - AORTIC VALVE MODEL 3300TFX, SIZE 23 MM;  Surgeon: Gaye Pollack, MD;  Location: [facility];  Service: Open Heart Surgery;  Laterality: N/A;  . BLADDER SURGERY    . CARDIAC VALVE REPLACEMENT    . COLONOSCOPY    . CORONARY ARTERY BYPASS GRAFT N/A 05/05/2017   Procedure: CORONARY ARTERY BYPASS  GRAFTING (CABG) x 4 WITH ENDOSCOPIC HARVESTING OF RIGHT SAPHENOUS VEIN;  Surgeon: Gaye Pollack, MD;  Location: [facility];  Service: Open Heart Surgery;  Laterality: N/A;  . CYSTOURETHROSCOPY    . JOINT REPLACEMENT Left    hip  . KNEE ARTHROPLASTY Right 01/12/2016   Procedure: RIGHT  TOTAL KNEE ARTHROPLASTY WITH COMPUTER NAVIGATION;  Surgeon: Rod Can, MD;  Location: [facility];  Service: Orthopedics;  Laterality: Right;  Needs RNFA  . NO PAST SURGERIES    . RIGHT/LEFT HEART CATH AND CORONARY ANGIOGRAPHY N/A 04/21/2017   Procedure: RIGHT/LEFT HEART CATH AND CORONARY ANGIOGRAPHY;  Surgeon: Isaias Cowman, MD;  Location: [facility];  Service: Cardiovascular;  Laterality: N/A;  . SKIN SURGERY Left    arm  . TEE WITHOUT CARDIOVERSION N/A 05/05/2017   Procedure: TRANSESOPHAGEAL ECHOCARDIOGRAM (TEE);  Surgeon: Gaye Pollack, MD;  Location: [facility];  Service: Open Heart Surgery;  Laterality: N/A;  . TRANSURETHRAL RESECTION OF BLADDER TUMOR WITH MITOMYCIN-C N/A 03/14/2018   Procedure: TRANSURETHRAL RESECTION OF BLADDER TUMOR WITH Gemcitabine;  Surgeon: Abbie Sons, MD;  Location: [facility];  Service: Urology;  Laterality: N/A;    Home Medications:  Allergies as of 03/31/2018   No Known Allergies     Medication List       Accurate as of March 31, 2018  1:10 PM. Always use your most recent med list.        aspirin 81 MG EC tablet Take 1 tablet (81 mg total) by mouth daily.   atorvastatin 40 MG tablet Commonly known as:  LIPITOR Take 1 tablet (40 mg total) by mouth daily at 6 PM.   cholecalciferol 25 MCG (1000 UT) tablet Commonly known as:  VITAMIN D Take 2,000 Units by mouth daily.   furosemide 40 MG tablet Commonly known as:  LASIX Take 0.5 tablets (20 mg total) by mouth daily.   Iron (Ferrous Sulfate) 142 (45 Fe) MG Tbcr Take 45 mg by mouth daily.   KLOR-CON M10 10 MEQ tablet Generic drug:  potassium chloride TAKE 1 TABLET BY MOUTH ONCE DAILY   losartan 25  MG tablet Commonly known as:  COZAAR Take 0.5 tablets (12.5 mg total) by mouth daily.   metoprolol tartrate 25 MG tablet Commonly known as:  LOPRESSOR Take 1 tablet (25 mg total) by mouth 2 (two) times daily.   naproxen sodium 220 MG tablet Commonly known as:  ALEVE Take 220 mg by mouth 2 (two) times daily as needed (pain).   omeprazole 20 MG capsule Commonly known as:  PRILOSEC Take 1 capsule (20 mg total) by mouth daily.   RA VITAMIN B-12 TR 1000 MCG Tbcr Generic drug:  Cyanocobalamin Take 1,000 mcg by mouth daily.   SitaGLIPtin-MetFORMIN HCl 50-1000 MG Tb24 Commonly known as:  JANUMET XR Take 1 tablet by mouth 2 (two) times daily.   vitamin C 1000 MG tablet Take 1,000 mg by mouth daily.       Allergies: No Known Allergies  Family History: Family History  Problem Relation Age of Onset  . Dementia Mother   . Stroke Mother   . Stroke Father   . Diabetes Father   . Heart disease Father   . Cancer Sister   . Hypertension Brother   . Cancer Sister     Social History:  reports that he quit smoking about 37 years ago. He quit after 25.00 years of use. He has never used smokeless tobacco. He reports that he does not drink alcohol or use drugs.  ROS: UROLOGY Frequent Urination?: No Hard to postpone urination?: No Burning/pain with urination?: No Get up at night to urinate?: No Leakage of urine?: No Urine stream starts and stops?: No Trouble starting stream?: No Do you have to strain to urinate?: No Blood in urine?: No Urinary tract infection?: No Sexually transmitted disease?: No Injury to kidneys or bladder?: No Painful intercourse?: No Weak stream?: No Erection problems?: No Penile pain?: No  Gastrointestinal Nausea?: No Vomiting?: No Indigestion/heartburn?: No Diarrhea?: No Constipation?: No  Constitutional Fever: No Night sweats?: No Weight loss?: No Fatigue?: No  Skin Skin rash/lesions?: No Itching?: No  Eyes Blurred vision?:  No Double vision?: No  Ears/Nose/Throat Sore throat?: No Sinus problems?: No  Hematologic/Lymphatic Swollen glands?: No Easy bruising?: No  Cardiovascular Leg swelling?: No Chest pain?: No  Respiratory Cough?: No Shortness of breath?: No  Endocrine Excessive thirst?: No  Musculoskeletal Back pain?: No Joint pain?: No  Neurological Headaches?: No Dizziness?: No  Psychologic Depression?: No Anxiety?: No  Physical Exam: BP (!) 173/65   Pulse (!) 103   Ht 5\' 11"  (1.803 m)   Wt 211 lb (95.7 kg)   BMI 29.43 kg/m   Constitutional:  Alert and oriented, No acute distress. HEENT: [place] AT, moist mucus membranes.  Trachea midline, no masses. Cardiovascular: No clubbing, cyanosis,  or edema. Respiratory: Normal respiratory effort, no increased work of breathing. Psychiatric: Normal mood and affect.   Assessment & Plan:   78 year old male status post TURBT for Ta low risk urothelial carcinoma the bladder.  The pathology report was discussed in detail.  The need for continued monitoring was stressed and recommend a follow-up cystoscopy in 3 months.  Abbie Sons, [place] [address], [place], [place] [address] [phone]

## 2018-05-02 DIAGNOSIS — D485 Neoplasm of uncertain behavior of skin: Secondary | ICD-10-CM | POA: Diagnosis not present

## 2018-05-02 DIAGNOSIS — L570 Actinic keratosis: Secondary | ICD-10-CM | POA: Diagnosis not present

## 2018-05-02 DIAGNOSIS — C4442 Squamous cell carcinoma of skin of scalp and neck: Secondary | ICD-10-CM | POA: Diagnosis not present

## 2018-05-03 ENCOUNTER — Ambulatory Visit: Payer: Self-pay | Admitting: *Deleted

## 2018-05-03 NOTE — Chronic Care Management (AMB) (Signed)
  Care Management Note   John [name] is a 78 y.o. year old male who is a primary care patient of Cannady, Barbaraann Faster, NP. The CM team was consult for assistance with chronic disease management and care coordination.   I reached out to [name] by phone today but was unsuccessful. I left a HIPPA compliant voice message requesting a return call from Mr. John [name].    Follow Up Plan: The CM team will reach out to the patient again over the next 7 days.   Janalyn Shy MHA,BSN,RN,CCM Nurse Care Coordinator [facility] Care Management [phone]

## 2018-05-11 DIAGNOSIS — N183 Chronic kidney disease, stage 3 (moderate): Secondary | ICD-10-CM | POA: Diagnosis not present

## 2018-05-11 DIAGNOSIS — I1310 Hypertensive heart and chronic kidney disease without heart failure, with stage 1 through stage 4 chronic kidney disease, or unspecified chronic kidney disease: Secondary | ICD-10-CM | POA: Diagnosis not present

## 2018-05-11 DIAGNOSIS — D508 Other iron deficiency anemias: Secondary | ICD-10-CM | POA: Diagnosis not present

## 2018-05-12 ENCOUNTER — Ambulatory Visit (INDEPENDENT_AMBULATORY_CARE_PROVIDER_SITE_OTHER): Payer: PPO | Admitting: Nurse Practitioner

## 2018-05-12 ENCOUNTER — Other Ambulatory Visit: Payer: Self-pay

## 2018-05-12 ENCOUNTER — Encounter: Payer: Self-pay | Admitting: Nurse Practitioner

## 2018-05-12 VITALS — BP 140/68 | HR 72 | Temp 98.3°F | Ht 71.0 in | Wt 208.0 lb

## 2018-05-12 DIAGNOSIS — R600 Localized edema: Secondary | ICD-10-CM

## 2018-05-12 DIAGNOSIS — N183 Chronic kidney disease, stage 3 unspecified: Secondary | ICD-10-CM

## 2018-05-12 DIAGNOSIS — D508 Other iron deficiency anemias: Secondary | ICD-10-CM | POA: Diagnosis not present

## 2018-05-12 DIAGNOSIS — I1310 Hypertensive heart and chronic kidney disease without heart failure, with stage 1 through stage 4 chronic kidney disease, or unspecified chronic kidney disease: Secondary | ICD-10-CM | POA: Diagnosis not present

## 2018-05-12 DIAGNOSIS — E1122 Type 2 diabetes mellitus with diabetic chronic kidney disease: Secondary | ICD-10-CM

## 2018-05-12 LAB — MICROALBUMIN, URINE WAIVED
Creatinine, Urine Waived: 200 mg/dL (ref 10–300)
Microalb, Ur Waived: 30 mg/L — ABNORMAL HIGH (ref 0–19)

## 2018-05-12 LAB — BAYER DCA HB A1C WAIVED: HB A1C (BAYER DCA - WAIVED): 6.4 % (ref <7.0)

## 2018-05-12 MED ORDER — FUROSEMIDE 40 MG PO TABS: 20.0000 mg | ORAL_TABLET | Freq: Every day | ORAL | 2 refills | Status: DC

## 2018-05-12 MED ORDER — METOPROLOL TARTRATE 25 MG PO TABS: 25.0000 mg | ORAL_TABLET | Freq: Two times a day (BID) | ORAL | 1 refills | Status: DC

## 2018-05-12 MED ORDER — LOSARTAN POTASSIUM 25 MG PO TABS: 12.5000 mg | ORAL_TABLET | Freq: Every day | ORAL | 2 refills | Status: DC

## 2018-05-12 NOTE — Progress Notes (Addendum)
BP 140/68 (BP Location: Left Arm, Patient Position: Sitting)   Pulse 72   Temp 98.3 F (36.8 C) (Oral)   Ht 5\' 11"  (1.803 m)   Wt 208 lb (94.3 kg)   SpO2 97%   BMI 29.01 kg/m    Subjective:    Patient ID: John [name], male    DOB: Jan 14, 1941, 78 y.o.   MRN: [id number]  HPI: John [name] is a 78 y.o. male  Chief Complaint  Patient presents with  . Follow-up  . Hypertension  . Hyperlipidemia  . Diabetes   HYPERTENSION / HYPERLIPIDEMIA Satisfied with current treatment? yes Duration of hypertension: chronic BP monitoring frequency: not checking BP range:  BP medication side effects: no Duration of hyperlipidemia: chronic Cholesterol medication side effects: no Cholesterol supplements: none Medication compliance: good compliance Aspirin: yes Recent stressors: no Recurrent headaches: no Visual changes: no Palpitations: no Dyspnea: no Chest pain: no Lower extremity edema: no Dizzy/lightheaded: no   DIABETES A1C in December 6.4%, today continues at 6.4%. Hypoglycemic episodes:no Polydipsia/polyuria: no Visual disturbance: no Chest pain: no Paresthesias: no Glucose Monitoring: no  Accucheck frequency: Not Checking  Fasting glucose:  Post prandial:  Evening:  Before meals: Taking Insulin?: no  Long acting insulin:  Short acting insulin: Blood Pressure Monitoring: not checking Retinal Examination: Up to Date Foot Exam: Up to Date Pneumovax: Up to Date Influenza: Up to Date Aspirin: yes   ANEMIA Anemia status: stable Etiology of anemia: anemia of chronic disease and iron deficiency Duration of anemia treatment: one year Compliance with treatment: good compliance Iron supplementation side effects: no Severity of anemia: mild Fatigue: no Decreased exercise tolerance: no  Dyspnea on exertion: no Palpitations: no Bleeding: no Pica: no   CHRONIC KIDNEY DISEASE Current CrCl 71.51. CKD status: controlled Medications renally dose: yes Previous  renal evaluation: no Pneumovax:  Up to Date Influenza Vaccine:  Up to Date    Relevant past medical, surgical, family and social history reviewed and updated as indicated. Interim medical history since our last visit reviewed. Allergies and medications reviewed and updated.  Review of Systems  Constitutional: Negative for activity change, diaphoresis, fatigue and fever.  Respiratory: Negative for cough, chest tightness, shortness of breath and wheezing.   Cardiovascular: Negative for chest pain, palpitations and leg swelling.  Gastrointestinal: Negative for abdominal distention, abdominal pain, constipation, diarrhea, nausea and vomiting.  Endocrine: Negative for cold intolerance, heat intolerance, polydipsia, polyphagia and polyuria.  Musculoskeletal: Negative.   Skin: Negative.   Neurological: Negative for dizziness, syncope, weakness, light-headedness, numbness and headaches.  Psychiatric/Behavioral: Negative.     Per HPI unless specifically indicated above     Objective:    BP 140/68 (BP Location: Left Arm, Patient Position: Sitting)   Pulse 72   Temp 98.3 F (36.8 C) (Oral)   Ht 5\' 11"  (1.803 m)   Wt 208 lb (94.3 kg)   SpO2 97%   BMI 29.01 kg/m   Wt Readings from Last 3 Encounters:  05/12/18 208 lb (94.3 kg)  03/31/18 211 lb (95.7 kg)  03/14/18 210 lb 1.6 oz (95.3 kg)    Physical Exam Vitals signs and nursing note reviewed.  Constitutional:      Appearance: He is well-developed.  HENT:     Head: Normocephalic and atraumatic.     Right Ear: Hearing normal. No drainage.     Left Ear: Hearing normal. No drainage.     Mouth/Throat:     Pharynx: Uvula midline.  Eyes:  General: Lids are normal.        Right eye: No discharge.        Left eye: No discharge.     Conjunctiva/sclera: Conjunctivae normal.     Pupils: Pupils are equal, round, and reactive to light.  Neck:     Musculoskeletal: Normal range of motion and neck supple.     Thyroid: No thyromegaly.      Vascular: No carotid bruit or JVD.     Trachea: Trachea normal.  Cardiovascular:     Rate and Rhythm: Normal rate and regular rhythm.     Heart sounds: Normal heart sounds, S1 normal and S2 normal. No murmur. No gallop.   Pulmonary:     Effort: Pulmonary effort is normal.     Breath sounds: Normal breath sounds.  Abdominal:     General: Bowel sounds are normal.     Palpations: Abdomen is soft. There is no hepatomegaly or splenomegaly.  Musculoskeletal: Normal range of motion.     Right lower leg: Edema (trace) present.     Left lower leg: Edema (trace) present.  Skin:    General: Skin is warm and dry.     Capillary Refill: Capillary refill takes less than 2 seconds.     Findings: No rash.  Neurological:     Mental Status: He is alert and oriented to person, place, and time.     Deep Tendon Reflexes: Reflexes are normal and symmetric.  Psychiatric:        Mood and Affect: Mood normal.        Behavior: Behavior normal.        Thought Content: Thought content normal.        Judgment: Judgment normal.    Diabetic Foot Exam - Simple   Simple Foot Form Visual Inspection See comments:  Yes Sensation Testing See comments:  Yes Pulse Check Posterior Tibialis and Dorsalis pulse intact bilaterally:  Yes Comments Sensation 7/10 bilateral feet, mild xerosis and thick toenails.     Results for orders placed or performed during the hospital encounter of 03/14/18  Glucose, capillary  Result Value Ref Range   Glucose-Capillary 123 (H) 70 - 99 mg/dL  Glucose, capillary  Result Value Ref Range   Glucose-Capillary 116 (H) 70 - 99 mg/dL  Surgical pathology  Result Value Ref Range   SURGICAL PATHOLOGY      Surgical Pathology CASE: [id number] PATIENT: John [name] Surgical Pathology Report     SPECIMEN SUBMITTED: A. Bladder, tumor  CLINICAL HISTORY: None provided  PRE-OPERATIVE DIAGNOSIS: Bladder tumor, recurrent  POST-OPERATIVE DIAGNOSIS: Same as pre op      DIAGNOSIS: A. URINARY BLADDER; TRANSURETHRAL RESECTION: - LOW-GRADE, NONINVASIVE PAPILLARY UROTHELIAL CARCINOMA. - MUSCULARIS PROPRIA PRESENT AND UNINVOLVED.  GROSS DESCRIPTION: A. Labeled: Bladder tumor Received: Formalin Tissue fragment(s): Multiple Size: Aggregate, 2.5 x 0.7 x 0.3 cm Description: Irregular fragments of tan soft tissue Entirely submitted in 1 cassette.   Final Diagnosis performed by Allena Napoleon, MD.   Electronically signed 03/15/2018 1:40:56PM The electronic signature indicates that the named Attending Pathologist has evaluated the specimen  Technical component performed at [facility], [address] [place], [place] [address] Lab: [phone] Dir: Rush Farmer, MD, MMM  Pro fessional component performed at [facility], [facility], [place], [place], [place] [address] Lab: [phone] Dir: Dellia Nims. Reuel Derby, MD       Assessment & Plan:   Problem List Items Addressed This Visit      Cardiovascular and Mediastinum  Hypertensive heart/kidney disease without HF and with CKD stage III (HCC)    Chronic, ongoing.  Initial BP elevated, repeat 140/68.  Avoid hypotension.  Continue current medication regimen.  Urine micro and BMP today.      Relevant Medications   furosemide (LASIX) 40 MG tablet   metoprolol tartrate (LOPRESSOR) 25 MG tablet   losartan (COZAAR) 25 MG tablet   Other Relevant Orders   Basic Metabolic Panel (BMET)     Endocrine   Type 2 diabetes mellitus with chronic kidney disease ([place]) - Primary    Chronic, ongoing.  A1C 6.4% today.  Continue current medication regimen.  Return in 3 months.      Relevant Medications   losartan (COZAAR) 25 MG tablet   Other Relevant Orders   Bayer DCA Hb A1c Waived   Microalbumin, Urine Waived     Genitourinary   CKD (chronic kidney disease) stage 3, GFR 30-59 ml/min (HCC)    Chronic, ongoing.  Continue current medication regimen.  BMP today.        Other   Iron deficiency anemia     Anemia panel and CBC today.  Continue current medication regimen.      Relevant Orders   CBC with Differential/Platelet   Anemia panel    Other Visit Diagnoses    Localized edema       Relevant Medications   furosemide (LASIX) 40 MG tablet       Follow up plan: Return in about 3 months (around 08/12/2018) for T2DM, HTN/HLD.

## 2018-05-12 NOTE — Assessment & Plan Note (Addendum)
Chronic, ongoing.  Initial BP elevated, repeat 140/68.  Avoid hypotension.  Continue current medication regimen.  Urine micro and BMP today.

## 2018-05-12 NOTE — Assessment & Plan Note (Signed)
Chronic, ongoing.  A1C 6.4% today.  Continue current medication regimen.  Return in 3 months.

## 2018-05-12 NOTE — Assessment & Plan Note (Signed)
Chronic, ongoing.  Continue current medication regimen.  BMP today.

## 2018-05-12 NOTE — Patient Instructions (Signed)
Carbohydrate Counting for Diabetes Mellitus, Adult  Carbohydrate counting is a method of keeping track of how many carbohydrates you eat. Eating carbohydrates naturally increases the amount of sugar (glucose) in the blood. Counting how many carbohydrates you eat helps keep your blood glucose within normal limits, which helps you manage your diabetes (diabetes mellitus). It is important to know how many carbohydrates you can safely have in each meal. This is different for every person. A diet and nutrition specialist (registered dietitian) can help you make a meal plan and calculate how many carbohydrates you should have at each meal and snack. Carbohydrates are found in the following foods:  Grains, such as breads and cereals.  Dried beans and soy products.  Starchy vegetables, such as potatoes, peas, and corn.  Fruit and fruit juices.  Milk and yogurt.  Sweets and snack foods, such as cake, cookies, candy, chips, and soft drinks. How do I count carbohydrates? There are two ways to count carbohydrates in food. You can use either of the methods or a combination of both. Reading "Nutrition Facts" on packaged food The "Nutrition Facts" list is included on the labels of almost all packaged foods and beverages in the [place] It includes:  The serving size.  Information about nutrients in each serving, including the grams (g) of carbohydrate per serving. To use the "Nutrition Facts":  Decide how many servings you will have.  Multiply the number of servings by the number of carbohydrates per serving.  The resulting number is the total amount of carbohydrates that you will be having. Learning standard serving sizes of other foods When you eat carbohydrate foods that are not packaged or do not include "Nutrition Facts" on the label, you need to measure the servings in order to count the amount of carbohydrates:  Measure the foods that you will eat with a food scale or measuring cup, if needed.   Decide how many standard-size servings you will eat.  Multiply the number of servings by 15. Most carbohydrate-rich foods have about 15 g of carbohydrates per serving. ? For example, if you eat 8 oz (170 g) of strawberries, you will have eaten 2 servings and 30 g of carbohydrates (2 servings x 15 g = 30 g).  For foods that have more than one food mixed, such as soups and casseroles, you must count the carbohydrates in each food that is included. The following list contains standard serving sizes of common carbohydrate-rich foods. Each of these servings has about 15 g of carbohydrates:   hamburger bun or  English muffin.   oz (15 mL) syrup.   oz (14 g) jelly.  1 slice of bread.  1 six-inch tortilla.  3 oz (85 g) cooked rice or pasta.  4 oz (113 g) cooked dried beans.  4 oz (113 g) starchy vegetable, such as peas, corn, or potatoes.  4 oz (113 g) hot cereal.  4 oz (113 g) mashed potatoes or  of a large baked potato.  4 oz (113 g) canned or frozen fruit.  4 oz (120 mL) fruit juice.  4-6 crackers.  6 chicken nuggets.  6 oz (170 g) unsweetened dry cereal.  6 oz (170 g) plain fat-free yogurt or yogurt sweetened with artificial sweeteners.  8 oz (240 mL) milk.  8 oz (170 g) fresh fruit or one small piece of fruit.  24 oz (680 g) popped popcorn. Example of carbohydrate counting Sample meal  3 oz (85 g) chicken breast.  6 oz (170 g)   brown rice.  4 oz (113 g) corn.  8 oz (240 mL) milk.  8 oz (170 g) strawberries with sugar-free whipped topping. Carbohydrate calculation 1. Identify the foods that contain carbohydrates: ? Rice. ? Corn. ? Milk. ? Strawberries. 2. Calculate how many servings you have of each food: ? 2 servings rice. ? 1 serving corn. ? 1 serving milk. ? 1 serving strawberries. 3. Multiply each number of servings by 15 g: ? 2 servings rice x 15 g = 30 g. ? 1 serving corn x 15 g = 15 g. ? 1 serving milk x 15 g = 15 g. ? 1 serving  strawberries x 15 g = 15 g. 4. Add together all of the amounts to find the total grams of carbohydrates eaten: ? 30 g + 15 g + 15 g + 15 g = 75 g of carbohydrates total. Summary  Carbohydrate counting is a method of keeping track of how many carbohydrates you eat.  Eating carbohydrates naturally increases the amount of sugar (glucose) in the blood.  Counting how many carbohydrates you eat helps keep your blood glucose within normal limits, which helps you manage your diabetes.  A diet and nutrition specialist (registered dietitian) can help you make a meal plan and calculate how many carbohydrates you should have at each meal and snack. This information is not intended to replace advice given to you by your health care provider. Make sure you discuss any questions you have with your health care provider. Document Released: 02/15/2005 Document Revised: 08/25/2016 Document Reviewed: 07/30/2015 Elsevier Interactive Patient Education  2019 Elsevier Inc.  

## 2018-05-12 NOTE — Addendum Note (Signed)
Addended by: Marnee Guarneri T on: 05/12/2018 09:40 AM   Modules accepted: Orders

## 2018-05-12 NOTE — Assessment & Plan Note (Signed)
Anemia panel and CBC today.  Continue current medication regimen.

## 2018-05-15 ENCOUNTER — Other Ambulatory Visit: Payer: Self-pay | Admitting: Nurse Practitioner

## 2018-05-15 ENCOUNTER — Telehealth: Payer: Self-pay

## 2018-05-15 MED ORDER — ATORVASTATIN CALCIUM 40 MG PO TABS: 40.0000 mg | ORAL_TABLET | Freq: Every day | ORAL | 2 refills | Status: DC

## 2018-05-15 NOTE — Progress Notes (Signed)
Atorvastatin refill placed.

## 2018-05-16 LAB — BASIC METABOLIC PANEL
BUN/Creatinine Ratio: 20 (ref 10–24)
BUN: 22 mg/dL (ref 8–27)
CO2: 23 mmol/L (ref 20–29)
CREATININE: 1.12 mg/dL (ref 0.76–1.27)
Calcium: 9 mg/dL (ref 8.6–10.2)
Chloride: 106 mmol/L (ref 96–106)
GFR calc non Af Amer: 62 mL/min/{1.73_m2} (ref >59)
GFR, EST AFRICAN AMERICAN: 71 mL/min/{1.73_m2} (ref >59)
Glucose: 116 mg/dL — ABNORMAL HIGH (ref 65–99)
Potassium: 4.3 mmol/L (ref 3.5–5.2)
Sodium: 146 mmol/L — ABNORMAL HIGH (ref 134–144)

## 2018-05-16 LAB — ANEMIA PANEL
Ferritin: 49 ng/mL (ref 30–400)
Folate, Hemolysate: 451 ng/mL
IRON: 114 ug/dL (ref 38–169)
Iron Saturation: 40 % (ref 15–55)
Total Iron Binding Capacity: 287 ug/dL (ref 250–450)
UIBC: 173 ug/dL (ref 111–343)
VITAMIN B 12: 896 pg/mL (ref 232–1245)

## 2018-05-16 LAB — CBC WITH DIFFERENTIAL/PLATELET

## 2018-05-17 ENCOUNTER — Other Ambulatory Visit: Payer: Self-pay

## 2018-05-17 ENCOUNTER — Ambulatory Visit: Payer: Self-pay | Admitting: *Deleted

## 2018-05-17 DIAGNOSIS — I1310 Hypertensive heart and chronic kidney disease without heart failure, with stage 1 through stage 4 chronic kidney disease, or unspecified chronic kidney disease: Secondary | ICD-10-CM

## 2018-05-17 DIAGNOSIS — E1122 Type 2 diabetes mellitus with diabetic chronic kidney disease: Secondary | ICD-10-CM

## 2018-05-17 DIAGNOSIS — N183 Chronic kidney disease, stage 3 (moderate): Principal | ICD-10-CM

## 2018-05-17 NOTE — Patient Instructions (Signed)
Visit Information  No CCM Goals related to patient denies barriers at this time.   The patient verbalized understanding of instructions provided today and declined a print copy of patient instruction materials.   The patient has been provided with contact information for the chronic care management team and has been advised to call with any health related questions or concerns.   Merlene Morse Suellyn Meenan RN, BSN Nurse Case Editor, commissioning [facility]  [phone]) Business Mobile

## 2018-05-17 NOTE — Chronic Care Management (AMB) (Signed)
  Chronic Care Management   Note  05/17/2018 Name: John [name] MRN: [id number] DOB: October 31, 1940  Contacted patient patient and explained CCM program through [facility]. Patient thankful for the call. Patient discussed he is managing his diabetes with diet. Stated he is not currently monitoring GBGs at this time and did not want to start. Patient stated he does go to the eye doctor 1x per year and his PCP checks his feet at visits.Stated he is not currently monitoring b/p at this time and did not want to start. Patient stated he does have b/p monitor if he felt it was necessary to check it. Patient stated he felt he was managing his bladder cancer by keeping his appointments with the urologist. Patient did not feel he had any barriers to care at this time and had no further health goals beyond what he was currently doing. We discussed recent hospitalization and s/s of infection for future recognition. Patient did not feel he was in need of a follow up CCM RN call.    Follow up plan: The patient has been provided with contact information for the chronic care management team and has been advised to call with any health related questions or concerns.   Merlene Morse Kalisa Girtman RN, BSN Nurse Case Editor, [facility]ing [facility]  [phone]) Business [employer]

## 2018-06-06 ENCOUNTER — Other Ambulatory Visit: Payer: Self-pay

## 2018-06-06 MED ORDER — OMEPRAZOLE 20 MG PO CPDR: 20.0000 mg | DELAYED_RELEASE_CAPSULE | Freq: Every day | ORAL | 3 refills | Status: DC

## 2018-06-19 ENCOUNTER — Telehealth: Payer: Self-pay | Admitting: Urology

## 2018-06-19 NOTE — Telephone Encounter (Signed)
You wanted this patient to keep his cysto app but he didn;t want to take a chance in coming in and we reschd it to June   Michelle

## 2018-06-27 ENCOUNTER — Ambulatory Visit: Payer: PPO | Admitting: Urology

## 2018-06-30 ENCOUNTER — Ambulatory Visit: Payer: PPO | Admitting: Urology

## 2018-07-12 DIAGNOSIS — I10 Essential (primary) hypertension: Secondary | ICD-10-CM | POA: Diagnosis not present

## 2018-08-15 ENCOUNTER — Other Ambulatory Visit: Payer: Self-pay

## 2018-08-15 ENCOUNTER — Ambulatory Visit: Payer: PPO | Admitting: Urology

## 2018-08-15 ENCOUNTER — Ambulatory Visit (INDEPENDENT_AMBULATORY_CARE_PROVIDER_SITE_OTHER): Payer: PPO | Admitting: Nurse Practitioner

## 2018-08-15 ENCOUNTER — Encounter: Payer: Self-pay | Admitting: Nurse Practitioner

## 2018-08-15 ENCOUNTER — Encounter: Payer: Self-pay | Admitting: Urology

## 2018-08-15 VITALS — BP 138/62 | HR 62 | Temp 97.9°F

## 2018-08-15 VITALS — BP 155/58 | HR 74 | Ht 71.0 in | Wt 203.8 lb

## 2018-08-15 DIAGNOSIS — C679 Malignant neoplasm of bladder, unspecified: Secondary | ICD-10-CM | POA: Diagnosis not present

## 2018-08-15 DIAGNOSIS — I1310 Hypertensive heart and chronic kidney disease without heart failure, with stage 1 through stage 4 chronic kidney disease, or unspecified chronic kidney disease: Secondary | ICD-10-CM

## 2018-08-15 DIAGNOSIS — D508 Other iron deficiency anemias: Secondary | ICD-10-CM

## 2018-08-15 DIAGNOSIS — E1122 Type 2 diabetes mellitus with diabetic chronic kidney disease: Secondary | ICD-10-CM

## 2018-08-15 DIAGNOSIS — D494 Neoplasm of unspecified behavior of bladder: Secondary | ICD-10-CM | POA: Diagnosis not present

## 2018-08-15 DIAGNOSIS — N183 Chronic kidney disease, stage 3 unspecified: Secondary | ICD-10-CM

## 2018-08-15 DIAGNOSIS — Z8551 Personal history of malignant neoplasm of bladder: Secondary | ICD-10-CM | POA: Diagnosis not present

## 2018-08-15 DIAGNOSIS — E785 Hyperlipidemia, unspecified: Secondary | ICD-10-CM | POA: Diagnosis not present

## 2018-08-15 DIAGNOSIS — E1169 Type 2 diabetes mellitus with other specified complication: Secondary | ICD-10-CM | POA: Diagnosis not present

## 2018-08-15 LAB — BAYER DCA HB A1C WAIVED: HB A1C (BAYER DCA - WAIVED): 6.6 % (ref <7.0)

## 2018-08-15 MED ORDER — LIDOCAINE HCL URETHRAL/MUCOSAL 2 % EX GEL
1.0000 "application " | Freq: Once | CUTANEOUS | Status: AC
  Administered 2018-08-15: 1 via URETHRAL

## 2018-08-15 NOTE — Progress Notes (Signed)
BP 138/62 (BP Location: Left Arm)   Pulse 62 Comment: apical  Temp 97.9 F (36.6 C) (Oral)   SpO2 96%    Subjective:    Patient ID: John [name], male    DOB: 01/18/1941, 78 y.o.   MRN: [id number]  HPI: John [name] is a 78 y.o. male  Chief Complaint  Patient presents with  . Diabetes  . Hyperlipidemia  . Hypertension   DIABETES Continues on Janumet XR 50-1000.  Last A1C was 6.4%. Hypoglycemic episodes:no Polydipsia/polyuria: no Visual disturbance: no Chest pain: no Paresthesias: no Glucose Monitoring: no  Accucheck frequency: Not Checking  Fasting glucose:  Post prandial:  Evening:  Before meals: Taking Insulin?: no  Long acting insulin:  Short acting insulin: Blood Pressure Monitoring: rarely Retinal Examination: Up to Date Foot Exam: Up to Date Pneumovax: Up to Date Influenza: Up to Date Aspirin: yes   HYPERTENSION / HYPERLIPIDEMIA Continues on Lasix and Metoprolol + Losartan + Lipitor, ASA.  Last saw Dr. Josefa Half 01/23/18. Satisfied with current treatment? yes Duration of hypertension: chronic BP monitoring frequency: rarely BP range: 130-140/60-80 BP medication side effects: no Duration of hyperlipidemia: chronic Cholesterol medication side effects: no Cholesterol supplements: none Medication compliance: good compliance Aspirin: yes Recent stressors: no Recurrent headaches: no Visual changes: no Palpitations: no Dyspnea: no Chest pain: no Lower extremity edema: yes, at baseline no worse Dizzy/lightheaded: no  Relevant past medical, surgical, family and social history reviewed and updated as indicated. Interim medical history since our last visit reviewed. Allergies and medications reviewed and updated.  Review of Systems  Constitutional: Negative for activity change, diaphoresis, fatigue and fever.  Respiratory: Negative for cough, chest tightness, shortness of breath and wheezing.   Cardiovascular: Negative for chest pain,  palpitations and leg swelling.  Gastrointestinal: Negative for abdominal distention, abdominal pain, constipation, diarrhea, nausea and vomiting.  Endocrine: Negative for cold intolerance, heat intolerance, polydipsia, polyphagia and polyuria.  Musculoskeletal: Negative.   Skin: Negative.   Neurological: Negative for dizziness, syncope, weakness, light-headedness, numbness and headaches.  Psychiatric/Behavioral: Negative.     Per HPI unless specifically indicated above     Objective:    BP 138/62 (BP Location: Left Arm)   Pulse 62 Comment: apical  Temp 97.9 F (36.6 C) (Oral)   SpO2 96%   Wt Readings from Last 3 Encounters:  05/12/18 208 lb (94.3 kg)  03/31/18 211 lb (95.7 kg)  03/14/18 210 lb 1.6 oz (95.3 kg)    Physical Exam Vitals signs and nursing note reviewed.  Constitutional:      Appearance: He is well-developed.  HENT:     Head: Normocephalic and atraumatic.     Right Ear: Hearing normal. No drainage.     Left Ear: Hearing normal. No drainage.     Mouth/Throat:     Pharynx: Uvula midline.  Eyes:     General: Lids are normal.        Right eye: No discharge.        Left eye: No discharge.     Conjunctiva/sclera: Conjunctivae normal.     Pupils: Pupils are equal, round, and reactive to light.  Neck:     Musculoskeletal: Normal range of motion and neck supple.     Thyroid: No thyromegaly.     Vascular: No carotid bruit or JVD.     Trachea: Trachea normal.  Cardiovascular:     Rate and Rhythm: Normal rate and regular rhythm.     Heart sounds: Normal heart sounds, S1 normal  and S2 normal. No murmur. No gallop.   Pulmonary:     Effort: Pulmonary effort is normal.     Breath sounds: Normal breath sounds.  Abdominal:     General: Bowel sounds are normal.     Palpations: Abdomen is soft. There is no hepatomegaly or splenomegaly.  Musculoskeletal: Normal range of motion.     Right lower leg: Edema (1+) present.     Left lower leg: Edema (1+) present.  Skin:     General: Skin is warm and dry.     Capillary Refill: Capillary refill takes less than 2 seconds.     Findings: No rash.  Neurological:     Mental Status: He is alert and oriented to person, place, and time.     Deep Tendon Reflexes: Reflexes are normal and symmetric.  Psychiatric:        Mood and Affect: Mood normal.        Behavior: Behavior normal.        Thought Content: Thought content normal.        Judgment: Judgment normal.     Results for orders placed or performed in visit on 05/12/18  Bayer DCA Hb A1c Waived  Result Value Ref Range   HB A1C (BAYER DCA - WAIVED) 6.4 <7.0 %  CBC with Differential/Platelet  Result Value Ref Range   WBC CANCELED x10E3/uL  Anemia panel  Result Value Ref Range   Total Iron Binding Capacity 287 250 - 450 ug/dL   UIBC 173 111 - 343 ug/dL   Iron 114 38 - 169 ug/dL   Iron Saturation 40 15 - 55 %   Vitamin B-12 896 232 - 1,245 pg/mL   Folate, Hemolysate 451.0 Not Estab. ng/mL   Hematocrit CANCELED    Ferritin 49 30 - 400 ng/mL   Retic Ct Pct CANCELED   Basic Metabolic Panel (BMET)  Result Value Ref Range   Glucose 116 (H) 65 - 99 mg/dL   BUN 22 8 - 27 mg/dL   Creatinine, Ser 1.12 0.76 - 1.27 mg/dL   GFR calc non Af Amer 62 >59 mL/min/1.73   GFR calc Af Amer 71 >59 mL/min/1.73   BUN/Creatinine Ratio 20 10 - 24   Sodium 146 (H) 134 - 144 mmol/L   Potassium 4.3 3.5 - 5.2 mmol/L   Chloride 106 96 - 106 mmol/L   CO2 23 20 - 29 mmol/L   Calcium 9.0 8.6 - 10.2 mg/dL  Microalbumin, Urine Waived  Result Value Ref Range   Microalb, Ur Waived 30 (H) 0 - 19 mg/L   Creatinine, Urine Waived 200 10 - 300 mg/dL   Microalb/Creat Ratio <30 <30 mg/g      Assessment & Plan:   Problem List Items Addressed This Visit      Cardiovascular and Mediastinum   Hypertensive heart/kidney disease without HF and with CKD stage III (HCC)    Chronic, ongoing.  BP below, initial BP slightly elevated but repeat at goal.  Avoid hypotension. Continue current  medication regimen.          Endocrine   Hyperlipidemia associated with type 2 diabetes mellitus (HCC)    Chronic, ongoing.  Continue current medication regimen and adjust as needed.  Lipid panel today.         Type 2 diabetes mellitus with chronic kidney disease (HCC) - Primary    Chronic, stable with A1C 6.6% today.  Continue current medication regimen and adjust as needed.  Return in 3 months.  Relevant Orders   Bayer DCA Hb A1c Waived   Lipid Panel w/o Chol/HDL Ratio   Magnesium     Other   Iron deficiency anemia   Relevant Orders   CBC with Differential/Platelet       Follow up plan: Return in about 3 months (around 11/15/2018) for T2DM, HTN/HLD, anemia.

## 2018-08-15 NOTE — Progress Notes (Addendum)
   08/16/18  CC:  Chief Complaint  Patient presents with  . 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 Schnorr presents for surveillance cystoscopy.  He has no complaints.  Denies gross hematuria.  Blood pressure (!) 155/58, pulse 74, height 5\' 11"  (1.803 m), weight 203 lb 12.8 oz (92.4 kg). NED. A&Ox3.   No respiratory distress   Abd soft, NT, ND Normal phallus with bilateral descended testicles  Cystoscopy Procedure Note  Patient identification was confirmed, informed consent was obtained, and patient was prepped using Betadine solution.  Lidocaine jelly was administered per urethral meatus.     Pre-Procedure: - Inspection reveals a normal caliber ureteral meatus.  Procedure: The flexible cystoscope was introduced without difficulty - No urethral strictures/lesions are present. - Mild to moderate prostate enlargement - Mild elevation bladder neck - Bilateral ureteral orifices identified; left normal, reimplanted right UO identified and able to place scope for ~1-2 cm with normal-appearing mucosa - Bladder mucosa  reveals no ulcers, tumors, or lesions - No bladder stones - No trabeculation  Retroflexion shows no abnormalities   Post-Procedure: - Patient tolerated the procedure well  Assessment/ Plan: No evidence recurrent urothelial carcinoma  Return in about 6 months (around 02/14/2019) for Cystoscopy.  Abbie Sons, MD

## 2018-08-15 NOTE — Assessment & Plan Note (Signed)
Chronic, ongoing.  Continue current medication regimen and adjust as needed. Lipid panel today. 

## 2018-08-15 NOTE — Patient Instructions (Signed)
Carbohydrate Counting for Diabetes Mellitus, Adult  Carbohydrate counting is a method of keeping track of how many carbohydrates you eat. Eating carbohydrates naturally increases the amount of sugar (glucose) in the blood. Counting how many carbohydrates you eat helps keep your blood glucose within normal limits, which helps you manage your diabetes (diabetes mellitus). It is important to know how many carbohydrates you can safely have in each meal. This is different for every person. A diet and nutrition specialist (registered dietitian) can help you make a meal plan and calculate how many carbohydrates you should have at each meal and snack. Carbohydrates are found in the following foods:  Grains, such as breads and cereals.  Dried beans and soy products.  Starchy vegetables, such as potatoes, peas, and corn.  Fruit and fruit juices.  Milk and yogurt.  Sweets and snack foods, such as cake, cookies, candy, chips, and soft drinks. How do I count carbohydrates? There are two ways to count carbohydrates in food. You can use either of the methods or a combination of both. Reading "Nutrition Facts" on packaged food The "Nutrition Facts" list is included on the labels of almost all packaged foods and beverages in the [place] It includes:  The serving size.  Information about nutrients in each serving, including the grams (g) of carbohydrate per serving. To use the "Nutrition Facts":  Decide how many servings you will have.  Multiply the number of servings by the number of carbohydrates per serving.  The resulting number is the total amount of carbohydrates that you will be having. Learning standard serving sizes of other foods When you eat carbohydrate foods that are not packaged or do not include "Nutrition Facts" on the label, you need to measure the servings in order to count the amount of carbohydrates:  Measure the foods that you will eat with a food scale or measuring cup, if needed.   Decide how many standard-size servings you will eat.  Multiply the number of servings by 15. Most carbohydrate-rich foods have about 15 g of carbohydrates per serving. ? For example, if you eat 8 oz (170 g) of strawberries, you will have eaten 2 servings and 30 g of carbohydrates (2 servings x 15 g = 30 g).  For foods that have more than one food mixed, such as soups and casseroles, you must count the carbohydrates in each food that is included. The following list contains standard serving sizes of common carbohydrate-rich foods. Each of these servings has about 15 g of carbohydrates:   hamburger bun or  English muffin.   oz (15 mL) syrup.   oz (14 g) jelly.  1 slice of bread.  1 six-inch tortilla.  3 oz (85 g) cooked rice or pasta.  4 oz (113 g) cooked dried beans.  4 oz (113 g) starchy vegetable, such as peas, corn, or potatoes.  4 oz (113 g) hot cereal.  4 oz (113 g) mashed potatoes or  of a large baked potato.  4 oz (113 g) canned or frozen fruit.  4 oz (120 mL) fruit juice.  4-6 crackers.  6 chicken nuggets.  6 oz (170 g) unsweetened dry cereal.  6 oz (170 g) plain fat-free yogurt or yogurt sweetened with artificial sweeteners.  8 oz (240 mL) milk.  8 oz (170 g) fresh fruit or one small piece of fruit.  24 oz (680 g) popped popcorn. Example of carbohydrate counting Sample meal  3 oz (85 g) chicken breast.  6 oz (170 g)   brown rice.  4 oz (113 g) corn.  8 oz (240 mL) milk.  8 oz (170 g) strawberries with sugar-free whipped topping. Carbohydrate calculation 1. Identify the foods that contain carbohydrates: ? Rice. ? Corn. ? Milk. ? Strawberries. 2. Calculate how many servings you have of each food: ? 2 servings rice. ? 1 serving corn. ? 1 serving milk. ? 1 serving strawberries. 3. Multiply each number of servings by 15 g: ? 2 servings rice x 15 g = 30 g. ? 1 serving corn x 15 g = 15 g. ? 1 serving milk x 15 g = 15 g. ? 1 serving  strawberries x 15 g = 15 g. 4. Add together all of the amounts to find the total grams of carbohydrates eaten: ? 30 g + 15 g + 15 g + 15 g = 75 g of carbohydrates total. Summary  Carbohydrate counting is a method of keeping track of how many carbohydrates you eat.  Eating carbohydrates naturally increases the amount of sugar (glucose) in the blood.  Counting how many carbohydrates you eat helps keep your blood glucose within normal limits, which helps you manage your diabetes.  A diet and nutrition specialist (registered dietitian) can help you make a meal plan and calculate how many carbohydrates you should have at each meal and snack. This information is not intended to replace advice given to you by your health care provider. Make sure you discuss any questions you have with your health care provider. Document Released: 02/15/2005 Document Revised: 08/25/2016 Document Reviewed: 07/30/2015 Elsevier Interactive Patient Education  2019 Elsevier Inc.  

## 2018-08-15 NOTE — Assessment & Plan Note (Signed)
Chronic, ongoing.  BP below, initial BP slightly elevated but repeat at goal.  Avoid hypotension. Continue current medication regimen.

## 2018-08-15 NOTE — Assessment & Plan Note (Addendum)
Chronic, stable with A1C 6.6% today.  Continue current medication regimen and adjust as needed.  Return in 3 months.

## 2018-08-16 LAB — URINALYSIS, COMPLETE
Bilirubin, UA: NEGATIVE
Glucose, UA: NEGATIVE
Ketones, UA: NEGATIVE
Leukocytes,UA: NEGATIVE
Nitrite, UA: NEGATIVE
Protein,UA: NEGATIVE
RBC, UA: NEGATIVE
Specific Gravity, UA: 1.01 (ref 1.005–1.030)
Urobilinogen, Ur: 0.2 mg/dL (ref 0.2–1.0)
pH, UA: 6.5 (ref 5.0–7.5)

## 2018-08-16 LAB — CBC WITH DIFFERENTIAL/PLATELET
Basophils Absolute: 0 10*3/uL (ref 0.0–0.2)
Basos: 0 %
EOS (ABSOLUTE): 0.3 10*3/uL (ref 0.0–0.4)
Eos: 5 %
Hematocrit: 37.5 % (ref 37.5–51.0)
Hemoglobin: 12.6 g/dL — ABNORMAL LOW (ref 13.0–17.7)
Immature Grans (Abs): 0 10*3/uL (ref 0.0–0.1)
Immature Granulocytes: 0 %
Lymphocytes Absolute: 1.3 10*3/uL (ref 0.7–3.1)
Lymphs: 25 %
MCH: 28.4 pg (ref 26.6–33.0)
MCHC: 33.6 g/dL (ref 31.5–35.7)
MCV: 85 fL (ref 79–97)
Monocytes Absolute: 0.5 10*3/uL (ref 0.1–0.9)
Monocytes: 9 %
Neutrophils Absolute: 3.3 10*3/uL (ref 1.4–7.0)
Neutrophils: 61 %
Platelets: 201 10*3/uL (ref 150–450)
RBC: 4.44 x10E6/uL (ref 4.14–5.80)
RDW: 12.9 % (ref 11.6–15.4)
WBC: 5.4 10*3/uL (ref 3.4–10.8)

## 2018-08-16 LAB — MICROSCOPIC EXAMINATION
Bacteria, UA: NONE SEEN
RBC, Urine: NONE SEEN /hpf (ref 0–2)
WBC, UA: NONE SEEN /hpf (ref 0–5)

## 2018-08-16 LAB — MAGNESIUM: Magnesium: 1.8 mg/dL (ref 1.6–2.3)

## 2018-08-16 LAB — LIPID PANEL W/O CHOL/HDL RATIO
Cholesterol, Total: 117 mg/dL (ref 100–199)
HDL: 50 mg/dL (ref >39)
LDL Calculated: 46 mg/dL (ref 0–99)
Triglycerides: 104 mg/dL (ref 0–149)
VLDL Cholesterol Cal: 21 mg/dL (ref 5–40)

## 2018-08-28 ENCOUNTER — Other Ambulatory Visit: Payer: PPO | Admitting: Urology

## 2018-09-05 ENCOUNTER — Other Ambulatory Visit: Payer: Self-pay | Admitting: Nurse Practitioner

## 2018-09-22 ENCOUNTER — Other Ambulatory Visit: Payer: PPO | Admitting: Urology

## 2018-10-07 ENCOUNTER — Other Ambulatory Visit: Payer: Self-pay | Admitting: Nurse Practitioner

## 2018-10-07 NOTE — Telephone Encounter (Signed)
Requested Prescriptions  Pending Prescriptions Disp Refills  . omeprazole (PRILOSEC) 20 MG capsule [Pharmacy Med Name: Omeprazole 20 MG Oral Capsule Delayed Release] 90 capsule 0    Sig: Take 1 capsule by mouth once daily     Gastroenterology: Proton Pump Inhibitors Passed - 10/07/2018 10:00 AM      Passed - Valid encounter within last 12 months    Recent Outpatient Visits          1 month ago Type 2 diabetes mellitus with stage 3 chronic kidney disease, without long-term current use of insulin ([place])   [place], Jolene T, NP   4 months ago Type 2 diabetes mellitus with stage 3 chronic kidney disease, without long-term current use of insulin ([place])   [place], Jolene T, NP   6 months ago Type 2 diabetes mellitus with stage 3 chronic kidney disease, without long-term current use of insulin ([place])   [place], Jolene T, NP   7 months ago Sepsis without acute organ dysfunction, due to unspecified organism ([place])   [place], Henrine Screws T, NP   10 months ago Essential hypertension   [place], Barbaraann Faster, NP      Future Appointments            In 2 months [place], Barbaraann Faster, NP [employer]

## 2018-10-26 DIAGNOSIS — Z859 Personal history of malignant neoplasm, unspecified: Secondary | ICD-10-CM | POA: Diagnosis not present

## 2018-10-26 DIAGNOSIS — L570 Actinic keratosis: Secondary | ICD-10-CM | POA: Diagnosis not present

## 2018-11-02 ENCOUNTER — Telehealth: Payer: Self-pay

## 2018-11-02 NOTE — Telephone Encounter (Signed)
Called to schedule medicare annual wellness visit with NHA- Tiffany Hill,LPN at [facility]. Please schedule anytime after 11/04/2018. Patient can have on phone or in office this year.  Any questions please have patient call Tiffany Hill,LPN at [phone].

## 2018-11-08 ENCOUNTER — Other Ambulatory Visit: Payer: Self-pay | Admitting: Nurse Practitioner

## 2018-11-08 DIAGNOSIS — R600 Localized edema: Secondary | ICD-10-CM

## 2018-11-15 ENCOUNTER — Ambulatory Visit (INDEPENDENT_AMBULATORY_CARE_PROVIDER_SITE_OTHER): Payer: PPO

## 2018-11-15 VITALS — BP 135/58 | HR 67 | Ht 71.0 in | Wt 203.0 lb

## 2018-11-15 DIAGNOSIS — Z0000 Encounter for general adult medical examination without abnormal findings: Secondary | ICD-10-CM | POA: Diagnosis not present

## 2018-11-15 NOTE — Progress Notes (Signed)
Subjective:   John [name] is a 78 y.o. male who presents for Medicare Annual/Subsequent preventive examination.  This visit is being conducted via phone call  - after an attmept to do on video chat - due to the COVID-19 pandemic. This patient has given me verbal consent via phone to conduct this visit, patient states they are participating from their home address. Some vital signs may be absent or patient reported.   Patient identification: identified by name, DOB, and current address.    Review of Systems:   Cardiac Risk Factors include: advanced age (>59men, >89 women);male gender;diabetes mellitus;dyslipidemia;hypertension     Objective:    Vitals: BP (!) 135/58 Comment: pt reported  Pulse 67 Comment: pt reported  Ht 5\' 11"  (1.803 m) Comment: pt reported  Wt 203 lb (92.1 kg) Comment: pt reported  BMI 28.31 kg/m   Body mass index is 28.31 kg/m.  Advanced Directives 11/15/2018 11/15/2018 03/14/2018 03/02/2018 02/08/2018 02/08/2018 02/07/2018  Does Patient Have a Medical Advance Directive? Yes No No No No No No  Type of Advance Directive Living will;Healthcare Power of Attorney - - - - - -  Does patient want to make changes to medical advance directive? - - - - - - -  Copy of [place] in Chart? No - copy requested - - - - - -  Would patient like information on creating a medical advance directive? - - No - Patient declined No - Patient declined No - Patient declined - No - Patient declined    Tobacco Social History   Tobacco Use  Smoking Status Former Smoker  . Years: 25.00  . Quit date: 12/15/1980  . Years since quitting: 37.9  Smokeless Tobacco Never Used     Counseling given: Not Answered   Clinical Intake:  Pre-visit preparation completed: Yes  Pain : No/denies pain     Nutritional Status: BMI 25 -29 Overweight Nutritional Risks: None Diabetes: Yes CBG done?: No Did pt. bring in CBG monitor from home?: No  How often do you need to  have someone help you when you read instructions, pamphlets, or other written materials from your doctor or pharmacy?: 1 - Never  Nutrition Risk Assessment:  Has the patient had any N/V/D within the last 2 months?  No  Does the patient have any non-healing wounds?  No  Has the patient had any unintentional weight loss or weight gain?  No   Diabetes:  Is the patient diabetic?  Yes  If diabetic, was a CBG obtained today?  No  Did the patient bring in their glucometer from home?  No  How often do you monitor your CBG's? Doesn't check at home   Financial Strains and Diabetes Management:  Are you having any financial strains with the device, your supplies or your medication? No .  Does the patient want to be seen by Chronic Care Management for management of their diabetes?  Yes  Would the patient like to be referred to a Nutritionist or for Diabetic Management?  No    Already in contact with ccm team   Diabetic Exams:  Diabetic Eye Exam: Completed 08/2017 Overdue for diabetic eye exam. Pt has been advised about the importance in completing this exam. Patient will call and schedule with [facility]  Diabetic Foot Exam: Completed 05/12/2018.   Interpreter Needed?: No  Information entered by :: Ramses Klecka,LPN  Past Medical History:  Diagnosis Date  . Anemia   . Anginal pain (  [place])   . Aortic stenosis   . Cancer ([place])    bladder  . Cataracts, bilateral   . CHF (congestive heart failure) ([place])   . Chronic kidney disease   . Coronary artery disease   . Diabetes mellitus without complication ([place])    takes Janumet daily...dx approx. 2009  . Dyspnea   . Dysrhythmia   . Heart murmur   . History of colon polyps    benign  . Hyperlipidemia    takes Lovastatin daily  . Hypertension    takes Hyzaar and Amlodipine daily  . Joint pain   . Medical history non-contributory   . Muscle cramps    occasionally in legs   . Osteoarthritis   . Pre-diabetes   . Senile purpura  ([place]) 03/13/2018  . Skin cancer of face   . Urinary urgency    Past Surgical History:  Procedure Laterality Date  . AORTIC VALVE REPLACEMENT N/A 05/05/2017   Procedure: AORTIC VALVE REPLACEMENT (AVR) USING MAGNA EASE PERICARDIAL BIOPROSTHESIS - AORTIC VALVE MODEL 3300TFX, SIZE 23 MM;  Surgeon: Gaye Pollack, MD;  Location: [facility] [facility];  Service: Open Heart Surgery;  Laterality: N/A;  . BLADDER SURGERY    . CARDIAC VALVE REPLACEMENT    . COLONOSCOPY    . CORONARY ARTERY BYPASS GRAFT N/A 05/05/2017   Procedure: CORONARY ARTERY BYPASS GRAFTING (CABG) x 4 WITH ENDOSCOPIC HARVESTING OF RIGHT SAPHENOUS VEIN;  Surgeon: Gaye Pollack, MD;  Location: [facility];  Service: Open Heart Surgery;  Laterality: N/A;  . CYSTOURETHROSCOPY    . JOINT REPLACEMENT Left    hip  . KNEE ARTHROPLASTY Right 01/12/2016   Procedure: RIGHT  TOTAL KNEE ARTHROPLASTY WITH COMPUTER NAVIGATION;  Surgeon: Rod Can, MD;  Location: [facility];  Service: Orthopedics;  Laterality: Right;  Needs RNFA  . NO PAST SURGERIES    . RIGHT/LEFT HEART CATH AND CORONARY ANGIOGRAPHY N/A 04/21/2017   Procedure: RIGHT/LEFT HEART CATH AND CORONARY ANGIOGRAPHY;  Surgeon: Isaias Cowman, MD;  Location: [facility];  Service: Cardiovascular;  Laterality: N/A;  . SKIN SURGERY Left    arm  . TEE WITHOUT CARDIOVERSION N/A 05/05/2017   Procedure: TRANSESOPHAGEAL ECHOCARDIOGRAM (TEE);  Surgeon: Gaye Pollack, MD;  Location: [facility];  Service: Open Heart Surgery;  Laterality: N/A;  . TRANSURETHRAL RESECTION OF BLADDER TUMOR WITH MITOMYCIN-C N/A 03/14/2018   Procedure: TRANSURETHRAL RESECTION OF BLADDER TUMOR WITH Gemcitabine;  Surgeon: Abbie Sons, MD;  Location: [facility];  Service: Urology;  Laterality: N/A;   Family History  Problem Relation Age of Onset  . Dementia Mother   . Stroke Mother   . Stroke Father   . Diabetes Father   . Heart disease Father   . Cancer Sister   . Hypertension Brother   . Cancer Sister    Social History    Socioeconomic History  . Marital status: Married    Spouse name: Not on file  . Number of children: Not on file  . Years of education: Not on file  . Highest education level: 10th grade  Occupational History  . Occupation: retired  Scientific laboratory technician  . Financial resource strain: Not hard at all  . Food insecurity    Worry: Never true    Inability: Never true  . Transportation needs    Medical: No    Non-medical: No  Tobacco Use  . Smoking status: Former Smoker    Years: 25.00    Quit date: 12/15/1980    Years  since quitting: 37.9  . Smokeless tobacco: Never Used  Substance and Sexual Activity  . Alcohol use: No    Alcohol/week: 0.0 standard drinks  . Drug use: No  . Sexual activity: Yes  Lifestyle  . Physical activity    Days per week: 3 days    Minutes per session: 10 min  . Stress: Not at all  Relationships  . Social Herbalist on phone: Not on file    Gets together: Not on file    Attends religious service: More than 4 times per year    Active member of club or organization: No    Attends meetings of clubs or organizations: Never    Relationship status: Married  Other Topics Concern  . Not on file  Social History Narrative  . Not on file    Outpatient Encounter Medications as of 11/15/2018  Medication Sig  . Ascorbic Acid (VITAMIN C) 1000 MG tablet Take 1,000 mg by mouth daily.  Marland Kitchen aspirin 81 MG EC tablet Take 1 tablet (81 mg total) by mouth daily.  Marland Kitchen atorvastatin (LIPITOR) 40 MG tablet Take 1 tablet (40 mg total) by mouth daily at 6 PM.  . cholecalciferol (VITAMIN D) 1000 units tablet Take 2,000 Units by mouth daily.  . Cyanocobalamin (RA VITAMIN B-12 TR) 1000 MCG TBCR Take 1,000 mcg by mouth daily.   . furosemide (LASIX) 40 MG tablet Take 1/2 (one-half) tablet by mouth once daily  . Iron, Ferrous Sulfate, 142 (45 Fe) MG TBCR Take 45 mg by mouth daily.  Marland Kitchen KLOR-CON M10 10 MEQ tablet Take 1 tablet by mouth once daily  . losartan (COZAAR) 25 MG tablet Take  0.5 tablets (12.5 mg total) by mouth daily.  . metoprolol tartrate (LOPRESSOR) 25 MG tablet Take 1 tablet by mouth twice daily  . naproxen sodium (ALEVE) 220 MG tablet Take 220 mg by mouth 2 (two) times daily as needed (pain).  Marland Kitchen omeprazole (PRILOSEC) 20 MG capsule Take 1 capsule by mouth once daily  . SitaGLIPtin-MetFORMIN HCl (JANUMET XR) 50-1000 MG TB24 Take 1 tablet by mouth 2 (two) times daily.   No facility-administered encounter medications on file as of 11/15/2018.     Activities of Daily Living In your present state of health, do you have any difficulty performing the following activities: 11/15/2018 03/02/2018  Hearing? Y Y  Comment no hearing aids -  Vision? N N  Comment eyeglasses, goes to [facility] -  Difficulty concentrating or making decisions? N N  Walking or climbing stairs? Y N  Comment takes time going up -  Dressing or bathing? N N  Doing errands, shopping? N N  Preparing Food and eating ? N -  Using the Toilet? N -  In the past six months, have you accidently leaked urine? N -  Do you have problems with loss of bowel control? N -  Managing your Medications? N -  Managing your Finances? N -  Housekeeping or managing your Housekeeping? N -  Some recent data might be hidden    Patient Care Team: Venita Lick, NP as PCP - General (Nurse Practitioner) Kathrine Haddock, NP as PCP - Family Medicine (Nurse Practitioner) Rod Can, MD as Consulting Physician (Orthopedic Surgery) Isaias Cowman, MD as Consulting Physician (Cardiology) Gaye Pollack, MD as Consulting Physician (Cardiothoracic Surgery) Jannet Mantis, MD (Dermatology) Clerance Lav, RN as Case Manager [place], Dalbert Garnet, RN as Mutual Management   Assessment:  This is a routine wellness examination for [place].  Exercise Activities and Dietary recommendations Current Exercise Habits: Home exercise routine, Time (Minutes): 10, Frequency (Times/Week):  3, Weekly Exercise (Minutes/Week): 30, Intensity: Mild, Exercise limited by: None identified  Goals    . DIET - INCREASE WATER INTAKE     Recommend drinking at least 6-8 glasses of water a day        Fall Risk Fall Risk  11/15/2018 11/03/2017 06/13/2017 11/03/2016 11/25/2015  Falls in the past year? 0 No No No No   FALL RISK PREVENTION PERTAINING TO THE HOME:  Any stairs in or around the home? Yes  If so, are there any without handrails? No   Home free of loose throw rugs in walkways, pet beds, electrical cords, etc? Yes  Adequate lighting in your home to reduce risk of falls? Yes   ASSISTIVE DEVICES UTILIZED TO PREVENT FALLS:  Life alert? No  Use of a cane, walker or w/c? No  Grab bars in the bathroom? No  Shower chair or bench in shower? No  Elevated toilet seat or a handicapped toilet? Yes    TIMED UP AND GO:  Unable to perform    Depression Screen PHQ 2/9 Scores 11/15/2018 11/15/2018 11/03/2017 06/13/2017  PHQ - 2 Score 0 0 0 2  PHQ- 9 Score - - - 8    Cognitive Function     6CIT Screen 11/03/2017 11/03/2016  What Year? 0 points 0 points  What month? 0 points 0 points  What time? 0 points 0 points  Count back from 20 0 points 0 points  Months in reverse 0 points 0 points  Repeat phrase 0 points 2 points  Total Score 0 2    Immunization History  Administered Date(s) Administered  . Influenza, High Dose Seasonal PF 11/25/2015, 11/03/2016, 11/03/2017  . Influenza,inj,Quad PF,6+ Mos 11/18/2014  . Pneumococcal Conjugate-13 11/23/2013  . Pneumococcal Polysaccharide-23 05/24/2014  . Zoster 05/19/1957    Qualifies for Shingles Vaccine? Yes  Zostavax completed n/a. Due for Shingrix. Education has been provided regarding the importance of this vaccine. Pt has been advised to call insurance company to determine out of pocket expense. Advised may also receive vaccine at local pharmacy or Health Dept. Verbalized acceptance and understanding.  Tdap: Discussed need for TD/TDAP  vaccine, patient verbalized understanding that this is not covered as a preventative with there insurance and to call the office if he develops any new skin injuries, ie: cuts, scrapes, bug bites, or open wounds.  Flu Vaccine: Due for Flu vaccine.   Pneumococcal Vaccine: up to date.   Screening Tests Health Maintenance  Topic Date Due  . OPHTHALMOLOGY EXAM  09/01/2018  . INFLUENZA VACCINE  09/30/2018  . TETANUS/TDAP  08/15/2019 (Originally 03/25/1959)  . HEMOGLOBIN A1C  02/14/2019  . FOOT EXAM  05/12/2019  . PNA vac Low Risk Adult  Completed  . COLONOSCOPY  Discontinued   Cancer Screenings:  Colorectal Screening: no longer required  Lung Cancer Screening: (Low Dose CT Chest recommended if Age 52-80 years, 30 pack-year currently smoking OR have quit w/in 15years.) does not qualify.    Additional Screening:  Hepatitis C Screening: does not qualify  Dental Screening: Recommended annual dental exams for proper oral hygiene  Community Resource Referral:  CRR required this visit?  No        Plan:  I have personally reviewed and addressed the Medicare Annual Wellness questionnaire and have noted the following in the patient's chart:  A. Medical and  social history B. Use of alcohol, tobacco or illicit drugs  C. Current medications and supplements D. Functional ability and status E.  Nutritional status F.  Physical activity G. Advance directives H. List of other physicians I.  Hospitalizations, surgeries, and ER visits in previous 12 months J.  [place] such as hearing and vision if needed, cognitive and depression L. Referrals and appointments   In addition, I have reviewed and discussed with patient certain preventive protocols, quality metrics, and best practice recommendations. A written personalized care plan for preventive services as well as general preventive health recommendations were provided to patient.   Signed,   Bevelyn Ngo, LPN  [id number]  Nurse Health Advisor  Nurse Notes: none

## 2018-11-15 NOTE — Patient Instructions (Signed)
Mr. John [name] , Thank you for taking time to come for your Medicare Wellness Visit. I appreciate your ongoing commitment to your health goals. Please review the following plan we discussed and let me know if I can assist you in the future.   Screening recommendations/referrals: Colonoscopy: no longer required Recommended yearly ophthalmology/optometry visit for glaucoma screening and checkup Recommended yearly dental visit for hygiene and checkup  Vaccinations: Influenza vaccine: due now  Pneumococcal vaccine: up to date  Tdap vaccine: due, check with your insurance for coverage  Shingles vaccine: shingrix eligible.     Advanced directives: Please bring a copy of your health care power of attorney and living will to the office at your convenience.  Conditions/risks identified: diabetic, discussed chronic care management team.   Next appointment: Follow up in one year for your annual wellness visit   Preventive Care 80 Years and Older, Male Preventive care refers to lifestyle choices and visits with your health care provider that can promote health and wellness. What does preventive care include?  A yearly physical exam. This is also called an annual well check.  Dental exams once or twice a year.  Routine eye exams. Ask your health care provider how often you should have your eyes checked.  Personal lifestyle choices, including:  Daily care of your teeth and gums.  Regular physical activity.  Eating a healthy diet.  Avoiding tobacco and drug use.  Limiting alcohol use.  Practicing safe sex.  Taking low doses of aspirin every day.  Taking vitamin and mineral supplements as recommended by your health care provider. What happens during an annual well check? The services and screenings done by your health care provider during your annual well check will depend on your age, overall health, lifestyle risk factors, and family history of disease. Counseling  Your health care  provider may ask you questions about your:  Alcohol use.  Tobacco use.  Drug use.  Emotional well-being.  Home and relationship well-being.  Sexual activity.  Eating habits.  History of falls.  Memory and ability to understand (cognition).  Work and work Statistician. Screening  You may have the following tests or measurements:  Height, weight, and BMI.  Blood pressure.  Lipid and cholesterol levels. These may be checked every 5 years, or more frequently if you are over 80 years old.  Skin check.  Lung cancer screening. You may have this screening every year starting at age 80 if you have a 30-pack-year history of smoking and currently smoke or have quit within the past 15 years.  Fecal occult blood test (FOBT) of the stool. You may have this test every year starting at age 80.  Flexible sigmoidoscopy or colonoscopy. You may have a sigmoidoscopy every 5 years or a colonoscopy every 10 years starting at age 80.  Prostate cancer screening. Recommendations will vary depending on your family history and other risks.  Hepatitis C blood test.  Hepatitis B blood test.  Sexually transmitted disease (STD) testing.  Diabetes screening. This is done by checking your blood sugar (glucose) after you have not eaten for a while (fasting). You may have this done every 1-3 years.  Abdominal aortic aneurysm (AAA) screening. You may need this if you are a current or former smoker.  Osteoporosis. You may be screened starting at age 80 if you are at high risk. Talk with your health care provider about your test results, treatment options, and if necessary, the need for more tests. Vaccines  Your health care  provider may recommend certain vaccines, such as:  Influenza vaccine. This is recommended every year.  Tetanus, diphtheria, and acellular pertussis (Tdap, Td) vaccine. You may need a Td booster every 10 years.  Zoster vaccine. You may need this after age 80.  Pneumococcal  13-valent conjugate (PCV13) vaccine. One dose is recommended after age 80.  Pneumococcal polysaccharide (PPSV23) vaccine. One dose is recommended after age 80. Talk to your health care provider about which screenings and vaccines you need and how often you need them. This information is not intended to replace advice given to you by your health care provider. Make sure you discuss any questions you have with your health care provider. Document Released: 03/14/2015 Document Revised: 11/05/2015 Document Reviewed: 12/17/2014 Elsevier Interactive Patient Education  2017 [place] Prevention in the Home Falls can cause injuries. They can happen to people of all ages. There are many things you can do to make your home safe and to help prevent falls. What can I do on the outside of my home?  Regularly fix the edges of walkways and driveways and fix any cracks.  Remove anything that might make you trip as you walk through a door, such as a raised step or threshold.  Trim any bushes or trees on the path to your home.  Use bright outdoor lighting.  Clear any walking paths of anything that might make someone trip, such as rocks or tools.  Regularly check to see if handrails are loose or broken. Make sure that both sides of any steps have handrails.  Any raised decks and porches should have guardrails on the edges.  Have any leaves, snow, or ice cleared regularly.  Use sand or salt on walking paths during winter.  Clean up any spills in your garage right away. This includes oil or grease spills. What can I do in the bathroom?  Use night lights.  Install grab bars by the toilet and in the tub and shower. Do not use towel bars as grab bars.  Use non-skid mats or decals in the tub or shower.  If you need to sit down in the shower, use a plastic, non-slip stool.  Keep the floor dry. Clean up any water that spills on the floor as soon as it happens.  Remove soap buildup in the  tub or shower regularly.  Attach bath mats securely with double-sided non-slip rug tape.  Do not have throw rugs and other things on the floor that can make you trip. What can I do in the bedroom?  Use night lights.  Make sure that you have a light by your bed that is easy to reach.  Do not use any sheets or blankets that are too big for your bed. They should not hang down onto the floor.  Have a firm chair that has side arms. You can use this for support while you get dressed.  Do not have throw rugs and other things on the floor that can make you trip. What can I do in the kitchen?  Clean up any spills right away.  Avoid walking on wet floors.  Keep items that you use a lot in easy-to-reach places.  If you need to reach something above you, use a strong step stool that has a grab bar.  Keep electrical cords out of the way.  Do not use floor polish or wax that makes floors slippery. If you must use wax, use non-skid floor wax.  Do not have throw  rugs and other things on the floor that can make you trip. What can I do with my stairs?  Do not leave any items on the stairs.  Make sure that there are handrails on both sides of the stairs and use them. Fix handrails that are broken or loose. Make sure that handrails are as long as the stairways.  Check any carpeting to make sure that it is firmly attached to the stairs. Fix any carpet that is loose or worn.  Avoid having throw rugs at the top or bottom of the stairs. If you do have throw rugs, attach them to the floor with carpet tape.  Make sure that you have a light switch at the top of the stairs and the bottom of the stairs. If you do not have them, ask someone to add them for you. What else can I do to help prevent falls?  Wear shoes that:  Do not have high heels.  Have rubber bottoms.  Are comfortable and fit you well.  Are closed at the toe. Do not wear sandals.  If you use a stepladder:  Make sure that it  is fully opened. Do not climb a closed stepladder.  Make sure that both sides of the stepladder are locked into place.  Ask someone to hold it for you, if possible.  Clearly mark and make sure that you can see:  Any grab bars or handrails.  First and last steps.  Where the edge of each step is.  Use tools that help you move around (mobility aids) if they are needed. These include:  Canes.  Walkers.  Scooters.  Crutches.  Turn on the lights when you go into a dark area. Replace any light bulbs as soon as they burn out.  Set up your furniture so you have a clear path. Avoid moving your furniture around.  If any of your floors are uneven, fix them.  If there are any pets around you, be aware of where they are.  Review your medicines with your doctor. Some medicines can make you feel dizzy. This can increase your chance of falling. Ask your doctor what other things that you can do to help prevent falls. This information is not intended to replace advice given to you by your health care provider. Make sure you discuss any questions you have with your health care provider. Document Released: 12/12/2008 Document Revised: 07/24/2015 Document Reviewed: 03/22/2014 Elsevier Interactive Patient Education  2017 [employer].

## 2018-12-11 ENCOUNTER — Encounter: Payer: PPO | Admitting: Nurse Practitioner

## 2018-12-13 ENCOUNTER — Other Ambulatory Visit: Payer: Self-pay

## 2018-12-13 ENCOUNTER — Other Ambulatory Visit: Payer: Self-pay | Admitting: Nurse Practitioner

## 2018-12-13 ENCOUNTER — Ambulatory Visit (INDEPENDENT_AMBULATORY_CARE_PROVIDER_SITE_OTHER): Payer: PPO

## 2018-12-13 DIAGNOSIS — Z23 Encounter for immunization: Secondary | ICD-10-CM | POA: Diagnosis not present

## 2018-12-28 ENCOUNTER — Other Ambulatory Visit: Payer: Self-pay | Admitting: Nurse Practitioner

## 2018-12-28 ENCOUNTER — Other Ambulatory Visit: Payer: Self-pay | Admitting: Unknown Physician Specialty

## 2019-01-11 DIAGNOSIS — E785 Hyperlipidemia, unspecified: Secondary | ICD-10-CM | POA: Diagnosis not present

## 2019-01-11 DIAGNOSIS — Z951 Presence of aortocoronary bypass graft: Secondary | ICD-10-CM | POA: Diagnosis not present

## 2019-01-11 DIAGNOSIS — I10 Essential (primary) hypertension: Secondary | ICD-10-CM | POA: Diagnosis not present

## 2019-01-11 DIAGNOSIS — R079 Chest pain, unspecified: Secondary | ICD-10-CM | POA: Diagnosis not present

## 2019-01-11 DIAGNOSIS — E119 Type 2 diabetes mellitus without complications: Secondary | ICD-10-CM | POA: Diagnosis not present

## 2019-01-11 DIAGNOSIS — I350 Nonrheumatic aortic (valve) stenosis: Secondary | ICD-10-CM | POA: Diagnosis not present

## 2019-01-11 DIAGNOSIS — Z952 Presence of prosthetic heart valve: Secondary | ICD-10-CM | POA: Diagnosis not present

## 2019-01-23 ENCOUNTER — Encounter: Payer: PPO | Admitting: Nurse Practitioner

## 2019-01-30 ENCOUNTER — Telehealth: Payer: Self-pay | Admitting: Nurse Practitioner

## 2019-01-30 ENCOUNTER — Other Ambulatory Visit: Payer: Self-pay | Admitting: Nurse Practitioner

## 2019-01-30 DIAGNOSIS — R600 Localized edema: Secondary | ICD-10-CM

## 2019-01-30 MED ORDER — KLOR-CON M10 10 MEQ PO TBCR: 10.0000 meq | EXTENDED_RELEASE_TABLET | Freq: Every day | ORAL | 3 refills | Status: DC

## 2019-01-30 MED ORDER — ATORVASTATIN CALCIUM 40 MG PO TABS: 40.0000 mg | ORAL_TABLET | Freq: Every day | ORAL | 2 refills | Status: DC

## 2019-01-30 MED ORDER — OMEPRAZOLE 20 MG PO CPDR: 20.0000 mg | DELAYED_RELEASE_CAPSULE | Freq: Every day | ORAL | 3 refills | Status: DC

## 2019-01-30 MED ORDER — LOSARTAN POTASSIUM 25 MG PO TABS: 12.5000 mg | ORAL_TABLET | Freq: Every day | ORAL | 3 refills | Status: DC

## 2019-01-30 MED ORDER — FUROSEMIDE 40 MG PO TABS: ORAL_TABLET | ORAL | 2 refills | Status: DC

## 2019-01-30 MED ORDER — METOPROLOL TARTRATE 25 MG PO TABS: 25.0000 mg | ORAL_TABLET | Freq: Two times a day (BID) | ORAL | 2 refills | Status: DC

## 2019-01-30 MED ORDER — JANUMET XR 50-1000 MG PO TB24: 1.0000 | ORAL_TABLET | Freq: Two times a day (BID) | ORAL | 3 refills | Status: DC

## 2019-01-30 NOTE — Telephone Encounter (Signed)
Pt called and stated that he would like all medication that Jolene would like him to have refilled. Pt is not sure what medications that might be. Please advise

## 2019-01-30 NOTE — Telephone Encounter (Signed)
Do you have any idea what the patient is talking about?

## 2019-01-30 NOTE — Telephone Encounter (Signed)
Needed refills on all meds, sent these.

## 2019-02-12 DIAGNOSIS — D485 Neoplasm of uncertain behavior of skin: Secondary | ICD-10-CM | POA: Diagnosis not present

## 2019-02-12 DIAGNOSIS — L570 Actinic keratosis: Secondary | ICD-10-CM | POA: Diagnosis not present

## 2019-02-12 DIAGNOSIS — C4442 Squamous cell carcinoma of skin of scalp and neck: Secondary | ICD-10-CM | POA: Diagnosis not present

## 2019-02-15 ENCOUNTER — Other Ambulatory Visit: Payer: Self-pay

## 2019-02-15 ENCOUNTER — Ambulatory Visit: Payer: PPO | Admitting: Urology

## 2019-02-15 ENCOUNTER — Encounter: Payer: Self-pay | Admitting: Urology

## 2019-02-15 VITALS — BP 153/70 | HR 101 | Ht 71.0 in | Wt 209.7 lb

## 2019-02-15 DIAGNOSIS — C679 Malignant neoplasm of bladder, unspecified: Secondary | ICD-10-CM

## 2019-02-15 LAB — URINALYSIS, COMPLETE
Bilirubin, UA: NEGATIVE
Ketones, UA: NEGATIVE
Leukocytes,UA: NEGATIVE
Nitrite, UA: NEGATIVE
Protein,UA: NEGATIVE
RBC, UA: NEGATIVE
Specific Gravity, UA: 1.025 (ref 1.005–1.030)
Urobilinogen, Ur: 0.2 mg/dL (ref 0.2–1.0)
pH, UA: 6.5 (ref 5.0–7.5)

## 2019-02-15 LAB — MICROSCOPIC EXAMINATION
Bacteria, UA: NONE SEEN
Epithelial Cells (non renal): NONE SEEN /hpf (ref 0–10)
RBC, Urine: NONE SEEN /hpf (ref 0–2)
WBC, UA: NONE SEEN /hpf (ref 0–5)

## 2019-02-15 MED ORDER — CIPROFLOXACIN HCL 500 MG PO TABS: 500.0000 mg | ORAL_TABLET | Freq: Once | ORAL | Status: DC

## 2019-02-15 MED ORDER — CIPROFLOXACIN HCL 500 MG PO TABS
500.0000 mg | ORAL_TABLET | Freq: Once | ORAL | Status: AC
  Administered 2019-02-15: 500 mg via ORAL

## 2019-02-15 MED ORDER — LIDOCAINE HCL URETHRAL/MUCOSAL 2 % EX GEL
1.0000 "application " | Freq: Once | CUTANEOUS | Status: AC
  Administered 2019-02-15: 1 via URETHRAL

## 2019-02-15 NOTE — Progress Notes (Signed)
   02/15/19  CC:  Chief Complaint  Patient presents with  . 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 male presents for surveillance cystoscopy.  He has no complaints.  Blood pressure (!) 153/70, pulse (!) 101, height 5\' 11"  (1.803 m), weight 209 lb 11.2 oz (95.1 kg). NED. A&Ox3.   No respiratory distress   Abd soft, NT, ND Normal phallus with bilateral descended testicles  Cystoscopy Procedure Note  Patient identification was confirmed, informed consent was obtained, and patient was prepped using Betadine solution.  Lidocaine jelly was administered per urethral meatus.     Pre-Procedure: - Inspection reveals a normal caliber urethral meatus.  Procedure: The flexible cystoscope was introduced without difficulty - No urethral strictures/lesions are present. - Moderate lateral lobe enlargement prostate  - Mild elevation bladder neck - Bilateral ureteral orifices identified - Right reimplanted ureteral orifice clear - Bladder mucosa a broad area of papillary tumor on the right posterior wall and inferior to the reimplanted ureteral orifice - No bladder stones -Mild trabeculation  Retroflexion shows no abnormalities   Post-Procedure: - Patient tolerated the procedure well - History post procedure UTI and he received Cipro 500 mg po  Assessment/ Plan: - Recurrent carcinoma bladder - Findings were discussed in detail with Mr. Butikofer and I recommended scheduling TURBT. - The indications and nature of the planned procedure were discussed as well as the potential benefits and expected outcome.  Alternatives have been discussed.  Potential complications were discussed including but not limited to bleeding, infection and bladder perforation. The postoperative care and followup was reviewed.  The patient was informed  that he may need additional treatment and continued surveillance cystoscopy.  All of his questions were answered and he desires to proceed.    Abbie Sons, MD

## 2019-02-23 ENCOUNTER — Encounter: Payer: Self-pay | Admitting: Nurse Practitioner

## 2019-02-23 DIAGNOSIS — R7989 Other specified abnormal findings of blood chemistry: Secondary | ICD-10-CM | POA: Insufficient documentation

## 2019-02-26 ENCOUNTER — Telehealth: Payer: Self-pay | Admitting: Radiology

## 2019-02-26 NOTE — Telephone Encounter (Signed)
I would not recommend delaying more than 3 months however the decision is ultimately his

## 2019-02-26 NOTE — Telephone Encounter (Signed)
Patient would like to postpone surgery until April/May due to [place].

## 2019-02-28 ENCOUNTER — Encounter: Payer: Self-pay | Admitting: Nurse Practitioner

## 2019-02-28 ENCOUNTER — Ambulatory Visit (INDEPENDENT_AMBULATORY_CARE_PROVIDER_SITE_OTHER): Payer: PPO | Admitting: Nurse Practitioner

## 2019-02-28 ENCOUNTER — Other Ambulatory Visit: Payer: Self-pay

## 2019-02-28 ENCOUNTER — Encounter

## 2019-02-28 VITALS — BP 128/64 | HR 63 | Temp 97.9°F | Ht 71.0 in | Wt 208.8 lb

## 2019-02-28 DIAGNOSIS — I350 Nonrheumatic aortic (valve) stenosis: Secondary | ICD-10-CM

## 2019-02-28 DIAGNOSIS — D509 Iron deficiency anemia, unspecified: Secondary | ICD-10-CM | POA: Diagnosis not present

## 2019-02-28 DIAGNOSIS — E785 Hyperlipidemia, unspecified: Secondary | ICD-10-CM | POA: Diagnosis not present

## 2019-02-28 DIAGNOSIS — Z0000 Encounter for general adult medical examination without abnormal findings: Secondary | ICD-10-CM

## 2019-02-28 DIAGNOSIS — I130 Hypertensive heart and chronic kidney disease with heart failure and stage 1 through stage 4 chronic kidney disease, or unspecified chronic kidney disease: Secondary | ICD-10-CM

## 2019-02-28 DIAGNOSIS — E1169 Type 2 diabetes mellitus with other specified complication: Secondary | ICD-10-CM | POA: Diagnosis not present

## 2019-02-28 DIAGNOSIS — E1121 Type 2 diabetes mellitus with diabetic nephropathy: Secondary | ICD-10-CM

## 2019-02-28 DIAGNOSIS — Z7189 Other specified counseling: Secondary | ICD-10-CM | POA: Diagnosis not present

## 2019-02-28 DIAGNOSIS — R7989 Other specified abnormal findings of blood chemistry: Secondary | ICD-10-CM | POA: Diagnosis not present

## 2019-02-28 DIAGNOSIS — C679 Malignant neoplasm of bladder, unspecified: Secondary | ICD-10-CM

## 2019-02-28 DIAGNOSIS — E538 Deficiency of other specified B group vitamins: Secondary | ICD-10-CM

## 2019-02-28 DIAGNOSIS — I5022 Chronic systolic (congestive) heart failure: Secondary | ICD-10-CM | POA: Diagnosis not present

## 2019-02-28 DIAGNOSIS — I1310 Hypertensive heart and chronic kidney disease without heart failure, with stage 1 through stage 4 chronic kidney disease, or unspecified chronic kidney disease: Secondary | ICD-10-CM | POA: Diagnosis not present

## 2019-02-28 DIAGNOSIS — N1831 Chronic kidney disease, stage 3a: Secondary | ICD-10-CM

## 2019-02-28 DIAGNOSIS — E1122 Type 2 diabetes mellitus with diabetic chronic kidney disease: Secondary | ICD-10-CM

## 2019-02-28 LAB — MICROALBUMIN, URINE WAIVED
Creatinine, Urine Waived: 100 mg/dL (ref 10–300)
Microalb, Ur Waived: 10 mg/L (ref 0–19)
Microalb/Creat Ratio: <30 mg/g (ref <30)

## 2019-02-28 LAB — BAYER DCA HB A1C WAIVED: HB A1C (BAYER DCA - WAIVED): 6.9 % (ref <7.0)

## 2019-02-28 NOTE — Assessment & Plan Note (Signed)
Chronic, ongoing.  Continue collaboration with cardiology and current medication regimen.  CMP today.

## 2019-02-28 NOTE — Assessment & Plan Note (Signed)
Ongoing, stable.  Continue daily iron.  Recheck iron and B12 level today + CBC. 

## 2019-02-28 NOTE — Assessment & Plan Note (Signed)
A voluntary discussion about advance care planning including the explanation and discussion of advance directives was extensively discussed  with the patient for 15 minutes with patient.  Explanation about the health care proxy and Living will was reviewed and packet with forms with explanation of how to fill them out was given.  During this discussion, the patient was able to identify a health care proxy as his wife and had filled papers out.  Patient was offered a separate [facility] visit for further assistance with forms.

## 2019-02-28 NOTE — Assessment & Plan Note (Signed)
Recheck TSH 

## 2019-02-28 NOTE — Assessment & Plan Note (Signed)
Chronic, ongoing.  Initial BPs elevated, but repeat manually at goal and home readings at goal for age.  Avoid hypotension. Continue current medication regimen and adjust as needed.  CMP today.  Urine ALB 10 and A:C <30.  Losartan for kidney protection.

## 2019-02-28 NOTE — Patient Instructions (Signed)
Carbohydrate Counting for Diabetes Mellitus, Adult  Carbohydrate counting is a method of keeping track of how many carbohydrates you eat. Eating carbohydrates naturally increases the amount of sugar (glucose) in the blood. Counting how many carbohydrates you eat helps keep your blood glucose within normal limits, which helps you manage your diabetes (diabetes mellitus). It is important to know how many carbohydrates you can safely have in each meal. This is different for every person. A diet and nutrition specialist (registered dietitian) can help you make a meal plan and calculate how many carbohydrates you should have at each meal and snack. Carbohydrates are found in the following foods:  Grains, such as breads and cereals.  Dried beans and soy products.  Starchy vegetables, such as potatoes, peas, and corn.  Fruit and fruit juices.  Milk and yogurt.  Sweets and snack foods, such as cake, cookies, candy, chips, and soft drinks. How do I count carbohydrates? There are two ways to count carbohydrates in food. You can use either of the methods or a combination of both. Reading "Nutrition Facts" on packaged food The "Nutrition Facts" list is included on the labels of almost all packaged foods and beverages in the [place] It includes:  The serving size.  Information about nutrients in each serving, including the grams (g) of carbohydrate per serving. To use the "Nutrition Facts":  Decide how many servings you will have.  Multiply the number of servings by the number of carbohydrates per serving.  The resulting number is the total amount of carbohydrates that you will be having. Learning standard serving sizes of other foods When you eat carbohydrate foods that are not packaged or do not include "Nutrition Facts" on the label, you need to measure the servings in order to count the amount of carbohydrates:  Measure the foods that you will eat with a food scale or measuring cup, if needed.   Decide how many standard-size servings you will eat.  Multiply the number of servings by 15. Most carbohydrate-rich foods have about 15 g of carbohydrates per serving. ? For example, if you eat 8 oz (170 g) of strawberries, you will have eaten 2 servings and 30 g of carbohydrates (2 servings x 15 g = 30 g).  For foods that have more than one food mixed, such as soups and casseroles, you must count the carbohydrates in each food that is included. The following list contains standard serving sizes of common carbohydrate-rich foods. Each of these servings has about 15 g of carbohydrates:   hamburger bun or  English muffin.   oz (15 mL) syrup.   oz (14 g) jelly.  1 slice of bread.  1 six-inch tortilla.  3 oz (85 g) cooked rice or pasta.  4 oz (113 g) cooked dried beans.  4 oz (113 g) starchy vegetable, such as peas, corn, or potatoes.  4 oz (113 g) hot cereal.  4 oz (113 g) mashed potatoes or  of a large baked potato.  4 oz (113 g) canned or frozen fruit.  4 oz (120 mL) fruit juice.  4-6 crackers.  6 chicken nuggets.  6 oz (170 g) unsweetened dry cereal.  6 oz (170 g) plain fat-free yogurt or yogurt sweetened with artificial sweeteners.  8 oz (240 mL) milk.  8 oz (170 g) fresh fruit or one small piece of fruit.  24 oz (680 g) popped popcorn. Example of carbohydrate counting Sample meal  3 oz (85 g) chicken breast.  6 oz (170 g)   brown rice.  4 oz (113 g) corn.  8 oz (240 mL) milk.  8 oz (170 g) strawberries with sugar-free whipped topping. Carbohydrate calculation 1. Identify the foods that contain carbohydrates: ? Rice. ? Corn. ? Milk. ? Strawberries. 2. Calculate how many servings you have of each food: ? 2 servings rice. ? 1 serving corn. ? 1 serving milk. ? 1 serving strawberries. 3. Multiply each number of servings by 15 g: ? 2 servings rice x 15 g = 30 g. ? 1 serving corn x 15 g = 15 g. ? 1 serving milk x 15 g = 15 g. ? 1 serving  strawberries x 15 g = 15 g. 4. Add together all of the amounts to find the total grams of carbohydrates eaten: ? 30 g + 15 g + 15 g + 15 g = 75 g of carbohydrates total. Summary  Carbohydrate counting is a method of keeping track of how many carbohydrates you eat.  Eating carbohydrates naturally increases the amount of sugar (glucose) in the blood.  Counting how many carbohydrates you eat helps keep your blood glucose within normal limits, which helps you manage your diabetes.  A diet and nutrition specialist (registered dietitian) can help you make a meal plan and calculate how many carbohydrates you should have at each meal and snack. This information is not intended to replace advice given to you by your health care provider. Make sure you discuss any questions you have with your health care provider. Document Released: 02/15/2005 Document Revised: 09/09/2016 Document Reviewed: 07/30/2015 [employer]vier Patient Education  2020 [employer]  

## 2019-02-28 NOTE — Assessment & Plan Note (Signed)
Chronic, stable with A1C 6.9% today.  Continue current medication regimen and adjust as needed.  Return in 3 months.  Avoid hypoglycemia due to advanced age.

## 2019-02-28 NOTE — Progress Notes (Signed)
BP 128/64 (BP Location: Left Arm)   Pulse 63   Temp 97.9 F (36.6 C) (Oral)   Ht 5\' 11"  (1.803 m)   Wt 208 lb 12.8 oz (94.7 kg)   SpO2 95%   BMI 29.12 kg/m    Subjective:    Patient ID: John [name], male    DOB: August 05, 1940, 78 y.o.   MRN: [id number]  HPI: John [name] is a 78 y.o. male presenting on 02/28/2019 for comprehensive medical examination. Current medical complaints include:none  He currently lives with: wife Interim Problems from his last visit: no   DIABETES Continues on Janumet XR 50-1000.  Last A1C was 6.6% in June. Hypoglycemic episodes:no Polydipsia/polyuria: no Visual disturbance: no Chest pain: no Paresthesias: no Glucose Monitoring: no             Accucheck frequency: Not Checking             Fasting glucose:             Post prandial:             Evening:             Before meals: Taking Insulin?: no             Long acting insulin:             Short acting insulin: Blood Pressure Monitoring: rarely Retinal Examination: Not Up To Date Foot Exam: Up to Date Pneumovax: Up to Date Influenza: Up to Date Aspirin: yes   HYPERTENSION / HYPERLIPIDEMIA Continues on Lasix and Metoprolol + Losartan + Lipitor, ASA.  Last saw Dr. Josefa Half 01/11/2019 with no medications changes. Has moderate underlying aortic stenosis noted on past imaging, which is monitored by cardiology.  Last echo 04/11/17 with EF 40-45% Satisfied with current treatment? yes Duration of hypertension: chronic BP monitoring frequency: rarely BP range: 130-140/60-80 BP medication side effects: no Duration of hyperlipidemia: chronic Cholesterol medication side effects: no Cholesterol supplements: none Medication compliance: good compliance Aspirin: yes Recent stressors: no Recurrent headaches: no Visual changes: no Palpitations: no Dyspnea: no Chest pain: no Lower extremity edema: yes, at baseline, reports this is improving Dizzy/lightheaded: no  BLADDER CANCER: Monitor  by urology with last visit with Dr. Bernardo Heater on 02/15/2019, during this visit noted recurrent bladder carcinoma and is to be scheduled for TURBT.  He does endorse that each time he has these procedures it takes longer to recover, so he is pondering after this procedure continuing.    ANEMIA Continues on iron daily. Anemia status: stable Etiology of anemia: Duration of anemia treatment: iron deficiency Compliance with treatment: good compliance Iron supplementation side effects: no Severity of anemia: mild Fatigue: no Decreased exercise tolerance: no  Dyspnea on exertion: no Palpitations: no Bleeding: no Pica: no   ELEVATED TSH: History of elevation in TSH, last October 2019 5.230.   Cold intolerance: no Heat intolerance: no Weight gain: no Weight loss: no Constipation: no Diarrhea/loose stools: no Palpitations: no Lower extremity edema: yes, at baseline, reports this is improving Anxiety/depressed mood: no  Functional Status Survey: Is the patient deaf or have difficulty hearing?: No Does the patient have difficulty seeing, even when wearing glasses/contacts?: No Does the patient have difficulty concentrating, remembering, or making decisions?: No Does the patient have difficulty walking or climbing stairs?: No Does the patient have difficulty dressing or bathing?: No Does the patient have difficulty doing errands alone such as visiting a doctor's office or shopping?: No  FALL RISK: Fall  Risk  11/15/2018 11/03/2017 06/13/2017 11/03/2016 11/25/2015  Falls in the past year? 0 No No No No    Depression Screen Depression screen [facility] 2/9 02/28/2019 11/15/2018 11/15/2018 11/03/2017 06/13/2017  Decreased Interest 0 0 0 0 1  Down, Depressed, Hopeless 0 0 0 0 1  PHQ - 2 Score 0 0 0 0 2  Altered sleeping - - - - 3  Tired, decreased energy - - - - 3  Change in appetite - - - - 0  Feeling bad or failure about yourself  - - - - 0  Trouble concentrating - - - - 0  Moving slowly or  fidgety/restless - - - - 0  Suicidal thoughts - - - - 0  PHQ-9 Score - - - - 8  Difficult doing work/chores - - - - Not difficult at all    Advanced Directives <no information>  Past Medical History:  Past Medical History:  Diagnosis Date  . Anemia   . Anginal pain ([place])   . Aortic stenosis   . Cancer ([place])    bladder  . Cataracts, bilateral   . CHF (congestive heart failure) ([place])   . Chronic kidney disease   . Coronary artery disease   . Diabetes mellitus without complication ([place])    takes Janumet daily...dx approx. 2009  . Dyspnea   . Dysrhythmia   . Heart murmur   . History of colon polyps    benign  . Hyperlipidemia    takes Lovastatin daily  . Hypertension    takes Hyzaar and Amlodipine daily  . Joint pain   . Medical history non-contributory   . Muscle cramps    occasionally in legs   . Osteoarthritis   . Pre-diabetes   . Senile purpura ([place]) 03/13/2018  . Skin cancer of face   . Urinary urgency     Surgical History:  Past Surgical History:  Procedure Laterality Date  . AORTIC VALVE REPLACEMENT N/A 05/05/2017   Procedure: AORTIC VALVE REPLACEMENT (AVR) USING MAGNA EASE PERICARDIAL BIOPROSTHESIS - AORTIC VALVE MODEL 3300TFX, SIZE 23 MM;  Surgeon: Gaye Pollack, MD;  Location: [place];  Service: Open Heart Surgery;  Laterality: N/A;  . BLADDER SURGERY    . CARDIAC VALVE REPLACEMENT    . COLONOSCOPY    . CORONARY ARTERY BYPASS GRAFT N/A 05/05/2017   Procedure: CORONARY ARTERY BYPASS GRAFTING (CABG) x 4 WITH ENDOSCOPIC HARVESTING OF RIGHT SAPHENOUS VEIN;  Surgeon: Gaye Pollack, MD;  Location: [place];  Service: Open Heart Surgery;  Laterality: N/A;  . CYSTOURETHROSCOPY    . JOINT REPLACEMENT Left    hip  . KNEE ARTHROPLASTY Right 01/12/2016   Procedure: RIGHT  TOTAL KNEE ARTHROPLASTY WITH COMPUTER NAVIGATION;  Surgeon: Rod Can, MD;  Location: [place];  Service: Orthopedics;  Laterality: Right;  Needs RNFA  . NO PAST SURGERIES    . RIGHT/LEFT HEART CATH  AND CORONARY ANGIOGRAPHY N/A 04/21/2017   Procedure: RIGHT/LEFT HEART CATH AND CORONARY ANGIOGRAPHY;  Surgeon: Isaias Cowman, MD;  Location: [place] [facility];  Service: Cardiovascular;  Laterality: N/A;  . SKIN SURGERY Left    arm  . TEE WITHOUT CARDIOVERSION N/A 05/05/2017   Procedure: TRANSESOPHAGEAL ECHOCARDIOGRAM (TEE);  Surgeon: Gaye Pollack, MD;  Location: [place];  Service: Open Heart Surgery;  Laterality: N/A;  . TRANSURETHRAL RESECTION OF BLADDER TUMOR WITH MITOMYCIN-C N/A 03/14/2018   Procedure: TRANSURETHRAL RESECTION OF BLADDER TUMOR WITH Gemcitabine;  Surgeon: Abbie Sons, MD;  Location: [facility];  Service: Urology;  Laterality: N/A;    Medications:  Current Outpatient Medications on File Prior to Visit  Medication Sig  . Ascorbic Acid (VITAMIN C) 1000 MG tablet Take 1,000 mg by mouth daily.  Marland Kitchen aspirin 81 MG EC tablet Take 1 tablet (81 mg total) by mouth daily.  Marland Kitchen atorvastatin (LIPITOR) 40 MG tablet Take 1 tablet (40 mg total) by mouth daily at 6 PM.  . cholecalciferol (VITAMIN D) 1000 units tablet Take 2,000 Units by mouth daily.  . Cyanocobalamin (RA VITAMIN B-12 TR) 1000 MCG TBCR Take 1,000 mcg by mouth daily.   . furosemide (LASIX) 40 MG tablet Take 1/2 (one-half) tablet by mouth once daily  . Iron, Ferrous Sulfate, 142 (45 Fe) MG TBCR Take 45 mg by mouth daily.  Marland Kitchen KLOR-CON M10 10 MEQ tablet Take 1 tablet (10 mEq total) by mouth daily.  Marland Kitchen losartan (COZAAR) 25 MG tablet Take 0.5 tablets (12.5 mg total) by mouth daily.  . metoprolol tartrate (LOPRESSOR) 25 MG tablet Take 1 tablet (25 mg total) by mouth 2 (two) times daily.  . naproxen sodium (ALEVE) 220 MG tablet Take 220 mg by mouth 2 (two) times daily as needed (pain).  Marland Kitchen omeprazole (PRILOSEC) 20 MG capsule Take 1 capsule (20 mg total) by mouth daily.  . SitaGLIPtin-MetFORMIN HCl (JANUMET XR) 50-1000 MG TB24 Take 1 tablet by mouth 2 (two) times daily.   No current facility-administered medications on file  prior to visit.    Allergies:  No Known Allergies  Social History:  Social History   Socioeconomic History  . Marital status: Married    Spouse name: Not on file  . Number of children: Not on file  . Years of education: Not on file  . Highest education level: 10th grade  Occupational History  . Occupation: retired  Tobacco Use  . Smoking status: Former Smoker    Years: 25.00    Quit date: 12/15/1980    Years since quitting: 38.2  . Smokeless tobacco: Never Used  Substance and Sexual Activity  . Alcohol use: No    Alcohol/week: 0.0 standard drinks  . Drug use: No  . Sexual activity: Yes  Other Topics Concern  . Not on file  Social History Narrative  . Not on file   Social Determinants of Health   Financial Resource Strain:   . Difficulty of Paying Living Expenses: Not on file  Food Insecurity:   . Worried About Charity fundraiser in the Last Year: Not on file  . Ran Out of Food in the Last Year: Not on file  Transportation Needs:   . Lack of Transportation (Medical): Not on file  . Lack of Transportation (Non-Medical): Not on file  Physical Activity: Unknown  . Days of Exercise per Week: Not on file  . Minutes of Exercise per Session: 10 min  Stress:   . Feeling of Stress : Not on file  Social Connections:   . Frequency of Communication with Friends and Family: Not on file  . Frequency of Social Gatherings with Friends and Family: Not on file  . Attends Religious Services: Not on file  . Active Member of Clubs or Organizations: Not on file  . Attends Archivist Meetings: Not on file  . Marital Status: Not on file  Intimate Partner Violence:   . Fear of Current or Ex-Partner: Not on file  . Emotionally Abused: Not on file  . Physically Abused: Not on file  . Sexually Abused: Not  on file   Social History   Tobacco Use  Smoking Status Former Smoker  . Years: 25.00  . Quit date: 12/15/1980  . Years since quitting: 38.2  Smokeless Tobacco Never  Used   Social History   Substance and Sexual Activity  Alcohol Use No  . Alcohol/week: 0.0 standard drinks    Family History:  Family History  Problem Relation Age of Onset  . Dementia Mother   . Stroke Mother   . Stroke Father   . Diabetes Father   . Heart disease Father   . Cancer Sister   . Hypertension Brother   . Cancer Sister     Past medical history, surgical history, medications, allergies, family history and social history reviewed with patient today and changes made to appropriate areas of the chart.   Review of Systems -  negative All other ROS negative except what is listed above and in the HPI.      Objective:    BP 128/64 (BP Location: Left Arm)   Pulse 63   Temp 97.9 F (36.6 C) (Oral)   Ht 5\' 11"  (1.803 m)   Wt 208 lb 12.8 oz (94.7 kg)   SpO2 95%   BMI 29.12 kg/m   Wt Readings from Last 3 Encounters:  02/28/19 208 lb 12.8 oz (94.7 kg)  02/15/19 209 lb 11.2 oz (95.1 kg)  11/15/18 203 lb (92.1 kg)    Physical Exam Vitals and nursing note reviewed.  Constitutional:      General: He is awake. He is not in acute distress.    Appearance: He is well-developed and overweight. He is not ill-appearing.  HENT:     Head: Normocephalic and atraumatic.     Right Ear: Hearing, tympanic membrane, ear canal and external ear normal. No drainage.     Left Ear: Hearing, tympanic membrane, ear canal and external ear normal. No drainage.     Nose: Nose normal.     Mouth/Throat:     Pharynx: Uvula midline.  Eyes:     General: Lids are normal.        Right eye: No discharge.        Left eye: No discharge.     Extraocular Movements: Extraocular movements intact.     Conjunctiva/sclera: Conjunctivae normal.     Pupils: Pupils are equal, round, and reactive to light.     Visual Fields: Right eye visual fields normal and left eye visual fields normal.  Neck:     Thyroid: No thyromegaly.     Vascular: No carotid bruit or JVD.  Cardiovascular:     Rate and  Rhythm: Normal rate and regular rhythm.     Heart sounds: S1 normal and S2 normal. Murmur present. Systolic murmur present with a grade of 2/6. No gallop.      Comments: Sparse hair pattern BLE.  Systolic murmur noted with radiation to carotids. Pulmonary:     Effort: Pulmonary effort is normal. No accessory muscle usage or respiratory distress.     Breath sounds: Normal breath sounds.  Abdominal:     General: Bowel sounds are normal.     Palpations: Abdomen is soft. There is no hepatomegaly or splenomegaly.     Tenderness: There is no abdominal tenderness.  Genitourinary:    Comments: Deferred per patient request, denies any acute issues. Musculoskeletal:        General: Normal range of motion.     Cervical back: Normal range of motion and neck supple.  Right lower leg: 1+ Edema present.     Left lower leg: 1+ Edema present.  Lymphadenopathy:     Head:     Right side of head: No submental, submandibular, tonsillar, preauricular or posterior auricular adenopathy.     Left side of head: No submental, submandibular, tonsillar, preauricular or posterior auricular adenopathy.     Cervical: No cervical adenopathy.  Skin:    General: Skin is warm and dry.     Capillary Refill: Capillary refill takes less than 2 seconds.     Findings: No rash.       Neurological:     Mental Status: He is alert and oriented to person, place, and time.     Cranial Nerves: Cranial nerves are intact.     Gait: Gait is intact.     Deep Tendon Reflexes: Reflexes are normal and symmetric.     Reflex Scores:      Brachioradialis reflexes are 2+ on the right side and 2+ on the left side.      Patellar reflexes are 2+ on the right side and 2+ on the left side. Psychiatric:        Attention and Perception: Attention normal.        Mood and Affect: Mood normal.        Speech: Speech normal.        Behavior: Behavior normal. Behavior is cooperative.        Thought Content: Thought content normal.         Cognition and Memory: Cognition normal.        Judgment: Judgment normal.    6CIT Screen 02/28/2019 11/03/2017 11/03/2016  What Year? 0 points 0 points 0 points  What month? 0 points 0 points 0 points  What time? 0 points 0 points 0 points  Count back from 20 0 points 0 points 0 points  Months in reverse 0 points 0 points 0 points  Repeat phrase 0 points 0 points 2 points  Total Score 0 0 2    Results for orders placed or performed in visit on 02/15/19  Microscopic Examination   URINE  Result Value Ref Range   WBC, UA None seen 0 - 5 /hpf   RBC None seen 0 - 2 /hpf   Epithelial Cells (non renal) None seen 0 - 10 /hpf   Bacteria, UA None seen None seen/Few  Urinalysis, Complete  Result Value Ref Range   Specific Gravity, UA 1.025 1.005 - 1.030   pH, UA 6.5 5.0 - 7.5   Color, UA Yellow Yellow   Appearance Ur Clear Clear   Leukocytes,UA Negative Negative   Protein,UA Negative Negative/Trace   Glucose, UA Trace (A) Negative   Ketones, UA Negative Negative   RBC, UA Negative Negative   Bilirubin, UA Negative Negative   Urobilinogen, Ur 0.2 0.2 - 1.0 mg/dL   Nitrite, UA Negative Negative   Microscopic Examination See below:       Assessment & Plan:   Problem List Items Addressed This Visit      Cardiovascular and Mediastinum   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  Initial BPs elevated, but repeat manually at goal and home readings at goal for age.  Avoid hypotension. Continue current medication regimen and adjust as needed.  CMP today.  Urine ALB 10 and A:C <30.  Losartan for kidney protection.      Relevant Orders   CBC with Differential/Platelet out  Aortic valve stenosis, moderate    Followed by cardiology.  Last echo 04/11/17.  Continue to collaborate with cardiology.      Chronic systolic heart failure (HCC)    Chronic, ongoing.  Continue collaboration with cardiology and current medication regimen.  CMP today.         Endocrine   Hyperlipidemia associated with type 2 diabetes mellitus (HCC)    Chronic, ongoing.  Continue current medication regimen and adjust as needed.  Lipid panel today.         Relevant Orders   Bayer DCA Hb A1c Waived   Comprehensive metabolic panel   Lipid Panel w/o Chol/HDL Ratio out   Type 2 diabetes mellitus with chronic kidney disease (HCC)    Chronic, stable with A1C 6.9% today.  Continue current medication regimen and adjust as needed.  Return in 3 months.  Avoid hypoglycemia due to advanced age.      Relevant Orders   Bayer DCA Hb A1c Waived   Microalbumin, Urine Waived here     Genitourinary   Bladder cancer ([place])    Continue to collaborate with urology, at length discussion on goals of care today.      CKD (chronic kidney disease) stage 3, GFR 30-59 ml/min    Chronic, stable with recent labs showing improved CRT and GFR.  Continue Losartan for kidney protection.  CMP today.        Other   Iron deficiency anemia    Ongoing, stable.  Continue daily iron.  Recheck iron and B12 level today + CBC.      Relevant Orders   Vitamin B12   Iron and TIBC   Advance care planning    A voluntary discussion about advance care planning including the explanation and discussion of advance directives was extensively discussed  with the patient for 15 minutes with patient.  Explanation about the health care proxy and Living will was reviewed and packet with forms with explanation of how to fill them out was given.  During this discussion, the patient was able to identify a health care proxy as his wife and had filled papers out.  Patient was offered a separate [place] visit for further assistance with forms.        Elevated TSH    Recheck TSH.      Relevant Orders   TSH    Other Visit Diagnoses    Annual physical exam    -  Primary   B12 deficiency       Recheck B12 level, continue supplement.   Relevant Orders   Vitamin B12       Discussed aspirin  prophylaxis for myocardial infarction prevention and decision was made to continue ASA  LABORATORY TESTING:  Health maintenance labs ordered today as discussed above.   The natural history of prostate cancer and ongoing controversy regarding screening and potential treatment outcomes of prostate cancer has been discussed with the patient. The meaning of a false positive PSA and a false negative PSA has been discussed. He indicates understanding of the limitations of this screening test and wishes not to proceed with screening PSA testing.  Followed by urology.   IMMUNIZATIONS:   - Tdap: Tetanus vaccination status reviewed: last tetanus booster within 10 years. - Influenza: Up to date - Pneumovax: Up to date - Prevnar: Up to date - Zostavax vaccine: Refused, would like Covid vaccine first  SCREENING: - Colonoscopy: Refused  Discussed with patient purpose of the colonoscopy is to  detect colon cancer at curable precancerous or early stages   - AAA Screening: Refused  -Hearing Test: Refused  -Spirometry: Refused   PATIENT COUNSELING:    Sexuality: Discussed sexually transmitted diseases, partner selection, use of condoms, avoidance of unintended pregnancy  and contraceptive alternatives.   Advised to avoid cigarette smoking.  A voluntary discussion about advance care planning including the explanation and discussion of advance directives was extensively discussed  with the patient for 10 minutes with patient.  Explanation about the health care proxy and Living will was reviewed and packet with forms with explanation of how to fill them out was given.  During this discussion, the patient was able to identify a health care proxy as his wife and has filled out forms previously + [employer].  Patient was offered a separate [place] visit for further assistance with forms.  At this time he does endorse DNR status and would prefer to focus on quality of life.  I discussed with the  patient that most people either abstain from alcohol or drink within safe limits (<=14/week and <=4 drinks/occasion for males, <=7/weeks and <= 3 drinks/occasion for females) and that the risk for alcohol disorders and other health effects rises proportionally with the number of drinks per week and how often a drinker exceeds daily limits.  Discussed cessation/primary prevention of drug use and availability of treatment for abuse.   Diet: Encouraged to adjust caloric intake to maintain  or achieve ideal body weight, to reduce intake of dietary saturated fat and total fat, to limit sodium intake by avoiding high sodium foods and not adding table salt, and to maintain adequate dietary potassium and calcium preferably from fresh fruits, vegetables, and low-fat dairy products.    stressed the importance of regular exercise  Injury prevention: Discussed safety belts, safety helmets, smoke detector, smoking near bedding or upholstery.   Dental health: Discussed importance of regular tooth brushing, flossing, and dental visits.   Follow up plan: NEXT PREVENTATIVE PHYSICAL DUE IN 1 YEAR. Return in about 3 months (around 05/29/2019) for T2DM, HTN/HLD.

## 2019-02-28 NOTE — Assessment & Plan Note (Signed)
Chronic, ongoing.  Continue current medication regimen and adjust as needed. Lipid panel today. 

## 2019-02-28 NOTE — Assessment & Plan Note (Signed)
Continue to collaborate with urology, at length discussion on goals of care today.

## 2019-02-28 NOTE — Assessment & Plan Note (Signed)
Followed by cardiology.  Last echo 04/11/17.  Continue to collaborate with cardiology.

## 2019-02-28 NOTE — Assessment & Plan Note (Signed)
Chronic, stable with recent labs showing improved CRT and GFR.  Continue Losartan for kidney protection.  CMP today.

## 2019-03-01 LAB — COMPREHENSIVE METABOLIC PANEL
ALT: 22 IU/L (ref 0–44)
AST: 25 IU/L (ref 0–40)
Albumin/Globulin Ratio: 1.5 (ref 1.2–2.2)
Albumin: 4.1 g/dL (ref 3.7–4.7)
Alkaline Phosphatase: 75 IU/L (ref 39–117)
BUN/Creatinine Ratio: 22 (ref 10–24)
BUN: 22 mg/dL (ref 8–27)
Bilirubin Total: 0.5 mg/dL (ref 0.0–1.2)
CO2: 30 mmol/L — ABNORMAL HIGH (ref 20–29)
Calcium: 9.4 mg/dL (ref 8.6–10.2)
Chloride: 103 mmol/L (ref 96–106)
Creatinine, Ser: 1.02 mg/dL (ref 0.76–1.27)
GFR calc Af Amer: 80 mL/min/{1.73_m2} (ref >59)
GFR calc non Af Amer: 69 mL/min/{1.73_m2} (ref >59)
Globulin, Total: 2.7 g/dL (ref 1.5–4.5)
Glucose: 134 mg/dL — ABNORMAL HIGH (ref 65–99)
Potassium: 4.4 mmol/L (ref 3.5–5.2)
Sodium: 145 mmol/L — ABNORMAL HIGH (ref 134–144)
Total Protein: 6.8 g/dL (ref 6.0–8.5)

## 2019-03-01 LAB — VITAMIN B12: Vitamin B-12: 1027 pg/mL (ref 232–1245)

## 2019-03-01 LAB — CBC WITH DIFFERENTIAL/PLATELET
Basophils Absolute: 0 10*3/uL (ref 0.0–0.2)
Basos: 0 %
EOS (ABSOLUTE): 0.4 10*3/uL (ref 0.0–0.4)
Eos: 6 %
Hematocrit: 40.3 % (ref 37.5–51.0)
Hemoglobin: 12.8 g/dL — ABNORMAL LOW (ref 13.0–17.7)
Immature Grans (Abs): 0 10*3/uL (ref 0.0–0.1)
Immature Granulocytes: 0 %
Lymphocytes Absolute: 1.5 10*3/uL (ref 0.7–3.1)
Lymphs: 25 %
MCH: 28 pg (ref 26.6–33.0)
MCHC: 31.8 g/dL (ref 31.5–35.7)
MCV: 88 fL (ref 79–97)
Monocytes Absolute: 0.4 10*3/uL (ref 0.1–0.9)
Monocytes: 7 %
Neutrophils Absolute: 3.8 10*3/uL (ref 1.4–7.0)
Neutrophils: 62 %
Platelets: 206 10*3/uL (ref 150–450)
RBC: 4.57 x10E6/uL (ref 4.14–5.80)
RDW: 12.5 % (ref 11.6–15.4)
WBC: 6.1 10*3/uL (ref 3.4–10.8)

## 2019-03-01 LAB — LIPID PANEL W/O CHOL/HDL RATIO
Cholesterol, Total: 124 mg/dL (ref 100–199)
HDL: 54 mg/dL (ref >39)
LDL Chol Calc (NIH): 53 mg/dL (ref 0–99)
Triglycerides: 91 mg/dL (ref 0–149)
VLDL Cholesterol Cal: 17 mg/dL (ref 5–40)

## 2019-03-01 LAB — IRON AND TIBC
Iron Saturation: 40 % (ref 15–55)
Iron: 110 ug/dL (ref 38–169)
Total Iron Binding Capacity: 272 ug/dL (ref 250–450)
UIBC: 162 ug/dL (ref 111–343)

## 2019-03-01 LAB — TSH: TSH: 3.98 u[IU]/mL (ref 0.450–4.500)

## 2019-03-01 NOTE — Progress Notes (Signed)
Please let Mr. Blonski know his labs are looking much better.  Anemia is improving.  Thyroid level is normal and cholesterol levels look good.  B12 level is normal.  Continue iron and B12 daily.  Sodium level is still a little high, mild, would like to ensure he is drinking a lot of water during daytime hours which is good for kidneys.  Overall, I am happy with these labs.  Great job!!  Have a Happy New Year!!

## 2019-03-05 NOTE — Telephone Encounter (Signed)
Patient would like a call to discuss in March.

## 2019-03-07 DIAGNOSIS — C4442 Squamous cell carcinoma of skin of scalp and neck: Secondary | ICD-10-CM | POA: Diagnosis not present

## 2019-04-09 ENCOUNTER — Telehealth: Payer: Self-pay

## 2019-04-09 NOTE — Telephone Encounter (Signed)
Copied from [place] [phone]. Topic: General - Other >> Apr 09, 2019  9:51 AM Rainey Pines A wrote: Patient would like a callback from [facility] or nurse in regards to omeprazole (PRILOSEC) 20 MG capsule. Patient wants to know why he was prescribed this medication. Please advise   Routing to provider to advise. I do not see anything regarding acid reflux on the problem list or anything.

## 2019-04-09 NOTE — Telephone Encounter (Signed)
Was taking this for anemia to prevent GI bleed.  He could try slowly coming off this if he wishes to reduce medications.  Reduce to taking every other day for a week and then every third day for a week, etc.. Until no longer taking.  Do not cold [place] it, but slowly come off.

## 2019-04-10 NOTE — Telephone Encounter (Signed)
Called and left patient a VM asking for him to please return my call.  

## 2019-04-11 NOTE — Telephone Encounter (Signed)
Patient notified and verbalized understanding of John [name]'s message.

## 2019-05-01 ENCOUNTER — Other Ambulatory Visit: Payer: Self-pay | Admitting: Radiology

## 2019-05-01 DIAGNOSIS — D494 Neoplasm of unspecified behavior of bladder: Secondary | ICD-10-CM

## 2019-05-03 ENCOUNTER — Other Ambulatory Visit: Payer: Self-pay

## 2019-05-03 ENCOUNTER — Other Ambulatory Visit: Payer: Self-pay | Admitting: Family Medicine

## 2019-05-03 ENCOUNTER — Encounter
Admission: RE | Admit: 2019-05-03 | Discharge: 2019-05-03 | Disposition: A | Discharge status: Home / Self Care | Payer: PPO | Source: Ambulatory Visit | Attending: Urology | Admitting: Urology

## 2019-05-03 DIAGNOSIS — D494 Neoplasm of unspecified behavior of bladder: Secondary | ICD-10-CM

## 2019-05-03 NOTE — Patient Instructions (Signed)
Your procedure is scheduled on: Tues 3/16 Report to Day Surgery. [facility] To find out your arrival time please call [phone] between [place] on Mon 3/15.  Remember: Instructions that are not followed completely may result in serious medical risk,  up to and including death, or upon the discretion of your surgeon and anesthesiologist your  surgery may need to be rescheduled.     _X__ 1. Do not eat food after midnight the night before your procedure.                 No gum chewing or hard candies. You may drink clear liquids up to 2 hours                 before you are scheduled to arrive for your surgery- DO not drink clear                 liquids within 2 hours of the start of your surgery.                 Clear Liquids include:  water, Gatorade, G2 or                  Gatorade Zero (avoid Red/Purple/Blue), Black Coffee or Tea (Do not add                 anything to coffee or tea). _____2.   Complete the carbohydrate drink provided to you, 2 hours before arrival.  __X__2.  On the morning of surgery brush your teeth with toothpaste and water, you                may rinse your mouth with mouthwash if you wish.  Do not swallow any toothpaste of mouthwash.     ___ 3.  No Alcohol for 24 hours before or after surgery.   ___ 4.  Do Not Smoke or use e-cigarettes For 24 Hours Prior to Your Surgery.                 Do not use any chewable tobacco products for at least 6 hours prior to                 surgery.  ____  5.  Bring all medications with you on the day of surgery if instructed.   __x__  6.  Notify your doctor if there is any change in your medical condition      (cold, fever, infections).     Do not wear jewelry,. Do not wear lotions, powders,. You may wear deodorant. Do not shave 48 hours prior to surgery. Men may shave face and neck. Do not bring valuables to the hospital.    [facility] is not responsible for any belongings or  valuables.  Contacts, dentures or bridgework may not be worn into surgery. Leave your suitcase in the car. After surgery it may be brought to your room. For patients admitted to the hospital, discharge time is determined by your treatment team.   Patients discharged the day of surgery will not be allowed to drive home.   Make arrangements for someone to be with you for the first 24 hours of your Same Day Discharge.    Please read over the following fact sheets that you were given:      __x__ Take these medicines the morning of surgery with A SIP OF WATER:    1. metoprolol tartrate (LOPRESSOR) 25 MG tablet  2.  3.   4.  5.  6.  ____ Fleet Enema (as directed)   ___ Use CHG Soap (or wipes) as directed  ____ Use Benzoyl Peroxide Gel as instructed  ____ Use inhalers on the day of surgery  _x___ Stop SitaGLIPtin-MetFORMIN HCl (JANUMET XR) 50-1000 MG TB24  2 days prior to surgery  Last dose on Sat.3/13   ____ Take 1/2 of usual insulin dose the night before surgery. No insulin the morning          of surgery.   _x___ Stop aspirin as per Dr. Murlean Caller instructions  ____ Stop Anti-inflammatories naproxen sodium (ALEVE) 220 MG tablet or ibuprofen on Tues 3/9   __x__ Stop supplements until after surgery.Ascorbic Acid (VITAMIN C) 1000 MG tablet on Tues 3/9    ____ Bring C-Pap to the hospital.

## 2019-05-07 ENCOUNTER — Other Ambulatory Visit: Payer: Self-pay

## 2019-05-07 ENCOUNTER — Encounter
Admission: RE | Admit: 2019-05-07 | Discharge: 2019-05-07 | Disposition: A | Discharge status: Home / Self Care | Payer: PPO | Source: Ambulatory Visit | Attending: Urology | Admitting: Urology

## 2019-05-07 ENCOUNTER — Other Ambulatory Visit: Payer: PPO

## 2019-05-07 DIAGNOSIS — D494 Neoplasm of unspecified behavior of bladder: Secondary | ICD-10-CM | POA: Diagnosis not present

## 2019-05-07 DIAGNOSIS — Z01810 Encounter for preprocedural cardiovascular examination: Secondary | ICD-10-CM | POA: Insufficient documentation

## 2019-05-07 DIAGNOSIS — I10 Essential (primary) hypertension: Secondary | ICD-10-CM | POA: Diagnosis not present

## 2019-05-07 LAB — MICROSCOPIC EXAMINATION: Bacteria, UA: NONE SEEN

## 2019-05-07 LAB — URINALYSIS, COMPLETE
Bilirubin, UA: NEGATIVE
Glucose, UA: NEGATIVE
Ketones, UA: NEGATIVE
Leukocytes,UA: NEGATIVE
Nitrite, UA: NEGATIVE
Protein,UA: NEGATIVE
Specific Gravity, UA: 1.02 (ref 1.005–1.030)
Urobilinogen, Ur: 0.2 mg/dL (ref 0.2–1.0)
pH, UA: 7 (ref 5.0–7.5)

## 2019-05-07 NOTE — Progress Notes (Signed)
Today's EKG reviewed by Dr. Lubertha Basque (anesthesia); if patient is not currently having any cardiac symptoms; ok to proceed to surgery.

## 2019-05-09 LAB — CULTURE, URINE COMPREHENSIVE

## 2019-05-11 ENCOUNTER — Other Ambulatory Visit
Admission: RE | Admit: 2019-05-11 | Discharge: 2019-05-11 | Disposition: A | Discharge status: Home / Self Care | Payer: PPO | Source: Ambulatory Visit | Attending: Urology | Admitting: Urology

## 2019-05-11 DIAGNOSIS — Z20822 Contact with and (suspected) exposure to covid-19: Secondary | ICD-10-CM | POA: Diagnosis not present

## 2019-05-11 DIAGNOSIS — Z01812 Encounter for preprocedural laboratory examination: Secondary | ICD-10-CM | POA: Diagnosis not present

## 2019-05-11 LAB — SARS CORONAVIRUS 2 (TAT 6-24 HRS): SARS Coronavirus 2: NEGATIVE

## 2019-05-15 ENCOUNTER — Ambulatory Visit: Payer: PPO | Admitting: Anesthesiology

## 2019-05-15 ENCOUNTER — Ambulatory Visit
Admission: RE | Admit: 2019-05-15 | Discharge: 2019-05-15 | Disposition: A | Discharge status: Home / Self Care | Payer: PPO | Attending: Urology | Admitting: Urology

## 2019-05-15 ENCOUNTER — Other Ambulatory Visit: Payer: Self-pay

## 2019-05-15 ENCOUNTER — Encounter
Admission: RE | Disposition: A | Discharge status: Home / Self Care | Payer: Self-pay | Source: Home / Self Care | Attending: Urology

## 2019-05-15 ENCOUNTER — Encounter: Payer: Self-pay | Admitting: Urology

## 2019-05-15 DIAGNOSIS — Z906 Acquired absence of other parts of urinary tract: Secondary | ICD-10-CM | POA: Insufficient documentation

## 2019-05-15 DIAGNOSIS — I5022 Chronic systolic (congestive) heart failure: Secondary | ICD-10-CM | POA: Diagnosis not present

## 2019-05-15 DIAGNOSIS — I350 Nonrheumatic aortic (valve) stenosis: Secondary | ICD-10-CM | POA: Insufficient documentation

## 2019-05-15 DIAGNOSIS — I509 Heart failure, unspecified: Secondary | ICD-10-CM | POA: Diagnosis not present

## 2019-05-15 DIAGNOSIS — Z87891 Personal history of nicotine dependence: Secondary | ICD-10-CM | POA: Diagnosis not present

## 2019-05-15 DIAGNOSIS — Z7984 Long term (current) use of oral hypoglycemic drugs: Secondary | ICD-10-CM | POA: Diagnosis not present

## 2019-05-15 DIAGNOSIS — Z8249 Family history of ischemic heart disease and other diseases of the circulatory system: Secondary | ICD-10-CM | POA: Insufficient documentation

## 2019-05-15 DIAGNOSIS — M1990 Unspecified osteoarthritis, unspecified site: Secondary | ICD-10-CM | POA: Diagnosis not present

## 2019-05-15 DIAGNOSIS — Z85828 Personal history of other malignant neoplasm of skin: Secondary | ICD-10-CM | POA: Diagnosis not present

## 2019-05-15 DIAGNOSIS — D631 Anemia in chronic kidney disease: Secondary | ICD-10-CM | POA: Diagnosis not present

## 2019-05-15 DIAGNOSIS — Z951 Presence of aortocoronary bypass graft: Secondary | ICD-10-CM | POA: Diagnosis not present

## 2019-05-15 DIAGNOSIS — I2510 Atherosclerotic heart disease of native coronary artery without angina pectoris: Secondary | ICD-10-CM | POA: Diagnosis not present

## 2019-05-15 DIAGNOSIS — C679 Malignant neoplasm of bladder, unspecified: Secondary | ICD-10-CM | POA: Diagnosis not present

## 2019-05-15 DIAGNOSIS — E1122 Type 2 diabetes mellitus with diabetic chronic kidney disease: Secondary | ICD-10-CM | POA: Insufficient documentation

## 2019-05-15 DIAGNOSIS — D494 Neoplasm of unspecified behavior of bladder: Secondary | ICD-10-CM

## 2019-05-15 DIAGNOSIS — I130 Hypertensive heart and chronic kidney disease with heart failure and stage 1 through stage 4 chronic kidney disease, or unspecified chronic kidney disease: Secondary | ICD-10-CM | POA: Insufficient documentation

## 2019-05-15 DIAGNOSIS — Z79899 Other long term (current) drug therapy: Secondary | ICD-10-CM | POA: Insufficient documentation

## 2019-05-15 DIAGNOSIS — N189 Chronic kidney disease, unspecified: Secondary | ICD-10-CM | POA: Insufficient documentation

## 2019-05-15 DIAGNOSIS — E1151 Type 2 diabetes mellitus with diabetic peripheral angiopathy without gangrene: Secondary | ICD-10-CM | POA: Insufficient documentation

## 2019-05-15 DIAGNOSIS — Z86008 Personal history of in-situ neoplasm of other site: Secondary | ICD-10-CM | POA: Insufficient documentation

## 2019-05-15 DIAGNOSIS — Z833 Family history of diabetes mellitus: Secondary | ICD-10-CM | POA: Diagnosis not present

## 2019-05-15 DIAGNOSIS — N183 Chronic kidney disease, stage 3 unspecified: Secondary | ICD-10-CM | POA: Diagnosis not present

## 2019-05-15 DIAGNOSIS — Z952 Presence of prosthetic heart valve: Secondary | ICD-10-CM | POA: Diagnosis not present

## 2019-05-15 DIAGNOSIS — C674 Malignant neoplasm of posterior wall of bladder: Secondary | ICD-10-CM | POA: Insufficient documentation

## 2019-05-15 DIAGNOSIS — E669 Obesity, unspecified: Secondary | ICD-10-CM | POA: Diagnosis not present

## 2019-05-15 DIAGNOSIS — E785 Hyperlipidemia, unspecified: Secondary | ICD-10-CM | POA: Diagnosis not present

## 2019-05-15 HISTORY — PX: TRANSURETHRAL RESECTION OF BLADDER TUMOR: SHX2575

## 2019-05-15 LAB — GLUCOSE, CAPILLARY
Glucose-Capillary: 131 mg/dL — ABNORMAL HIGH (ref 70–99)
Glucose-Capillary: 119 mg/dL — ABNORMAL HIGH (ref 70–99)

## 2019-05-15 SURGERY — TURBT (TRANSURETHRAL RESECTION OF BLADDER TUMOR)
Anesthesia: General

## 2019-05-15 MED ORDER — PROPOFOL 500 MG/50ML IV EMUL
INTRAVENOUS | Status: AC
  Filled 2019-05-15: qty 50

## 2019-05-15 MED ORDER — ONDANSETRON HCL 4 MG/2ML IJ SOLN
INTRAMUSCULAR | Status: DC | PRN
  Administered 2019-05-15: 4 mg via INTRAVENOUS

## 2019-05-15 MED ORDER — EPHEDRINE SULFATE 50 MG/ML IJ SOLN
INTRAMUSCULAR | Status: DC | PRN
  Administered 2019-05-15: 10 mg via INTRAVENOUS

## 2019-05-15 MED ORDER — HYDROCODONE-ACETAMINOPHEN 5-325 MG PO TABS: 1.0000 | ORAL_TABLET | Freq: Four times a day (QID) | ORAL | 0 refills | Status: DC | PRN

## 2019-05-15 MED ORDER — LIDOCAINE HCL (CARDIAC) PF 100 MG/5ML IV SOSY
PREFILLED_SYRINGE | INTRAVENOUS | Status: DC | PRN
  Administered 2019-05-15: 20 mg via INTRAVENOUS

## 2019-05-15 MED ORDER — KETOROLAC TROMETHAMINE 30 MG/ML IJ SOLN
INTRAMUSCULAR | Status: AC
  Filled 2019-05-15: qty 1

## 2019-05-15 MED ORDER — FENTANYL CITRATE (PF) 100 MCG/2ML IJ SOLN
INTRAMUSCULAR | Status: AC
  Filled 2019-05-15: qty 2

## 2019-05-15 MED ORDER — FENTANYL CITRATE (PF) 100 MCG/2ML IJ SOLN
INTRAMUSCULAR | Status: DC | PRN
  Administered 2019-05-15: 25 ug via INTRAVENOUS

## 2019-05-15 MED ORDER — ONDANSETRON HCL 4 MG/2ML IJ SOLN: 4.0000 mg | Freq: Once | INTRAMUSCULAR | Status: DC | PRN

## 2019-05-15 MED ORDER — FAMOTIDINE 20 MG PO TABS
20.0000 mg | ORAL_TABLET | Freq: Once | ORAL | Status: AC
  Administered 2019-05-15: 20 mg via ORAL

## 2019-05-15 MED ORDER — LACTATED RINGERS IV SOLN: INTRAVENOUS | Status: DC | PRN

## 2019-05-15 MED ORDER — FENTANYL CITRATE (PF) 100 MCG/2ML IJ SOLN
INTRAMUSCULAR | Status: AC
  Administered 2019-05-15: 25 ug via INTRAVENOUS
  Filled 2019-05-15: qty 2

## 2019-05-15 MED ORDER — FENTANYL CITRATE (PF) 100 MCG/2ML IJ SOLN
25.0000 ug | INTRAMUSCULAR | Status: DC | PRN
  Administered 2019-05-15: 25 ug via INTRAVENOUS

## 2019-05-15 MED ORDER — FAMOTIDINE 20 MG PO TABS
ORAL_TABLET | ORAL | Status: AC
  Filled 2019-05-15: qty 1

## 2019-05-15 MED ORDER — ONDANSETRON HCL 4 MG/2ML IJ SOLN
INTRAMUSCULAR | Status: AC
  Filled 2019-05-15: qty 2

## 2019-05-15 MED ORDER — EPHEDRINE 5 MG/ML INJ
INTRAVENOUS | Status: AC
  Filled 2019-05-15: qty 10

## 2019-05-15 MED ORDER — CEFAZOLIN SODIUM-DEXTROSE 2-4 GM/100ML-% IV SOLN
INTRAVENOUS | Status: AC
  Filled 2019-05-15: qty 100

## 2019-05-15 MED ORDER — MIDAZOLAM HCL 2 MG/2ML IJ SOLN
INTRAMUSCULAR | Status: AC
  Filled 2019-05-15: qty 2

## 2019-05-15 MED ORDER — DEXAMETHASONE SODIUM PHOSPHATE 10 MG/ML IJ SOLN
INTRAMUSCULAR | Status: DC | PRN
  Administered 2019-05-15: 10 mg via INTRAVENOUS

## 2019-05-15 MED ORDER — PROPOFOL 10 MG/ML IV BOLUS
INTRAVENOUS | Status: DC | PRN
  Administered 2019-05-15: 130 mg via INTRAVENOUS

## 2019-05-15 MED ORDER — SODIUM CHLORIDE 0.9 % IV SOLN: INTRAVENOUS | Status: DC

## 2019-05-15 MED ORDER — CEFAZOLIN SODIUM-DEXTROSE 2-4 GM/100ML-% IV SOLN
2.0000 g | INTRAVENOUS | Status: AC
  Administered 2019-05-15: 2 g via INTRAVENOUS

## 2019-05-15 SURGICAL SUPPLY — 24 items
BAG DRAIN CYSTO-URO LG1000N (MISCELLANEOUS) ×3 IMPLANT
BAG URINE DRAIN 2000ML AR STRL (UROLOGICAL SUPPLIES) ×3 IMPLANT
CATH FOLEY 2WAY 18X30 (CATHETERS) IMPLANT
CATH FOLEY 2WAY SIL 18X30 (CATHETERS)
DRAPE UTILITY 15X26 TOWEL STRL (DRAPES) ×3 IMPLANT
DRSG TELFA 4X3 1S NADH ST (GAUZE/BANDAGES/DRESSINGS) ×3 IMPLANT
ELECT BIVAP BIPO 22/24 DONUT (ELECTROSURGICAL) ×3
ELECT LOOP 22F BIPOLAR SML (ELECTROSURGICAL) ×3
ELECT REM PT RETURN 9FT ADLT (ELECTROSURGICAL)
ELECTRD BIVAP BIPO 22/24 DONUT (ELECTROSURGICAL) IMPLANT
ELECTRODE LOOP 22F BIPOLAR SML (ELECTROSURGICAL) IMPLANT
ELECTRODE REM PT RTRN 9FT ADLT (ELECTROSURGICAL) IMPLANT
GLOVE BIO SURGEON STRL SZ8 (GLOVE) ×3 IMPLANT
GOWN STRL REUS W/ TWL LRG LVL3 (GOWN DISPOSABLE) ×1 IMPLANT
GOWN STRL REUS W/TWL LRG LVL3 (GOWN DISPOSABLE) ×2
GOWN STRL REUS W/TWL XL LVL4 (GOWN DISPOSABLE) ×3 IMPLANT
IV NS IRRIG 3000ML ARTHROMATIC (IV SOLUTION) ×6 IMPLANT
KIT TURNOVER CYSTO (KITS) ×3 IMPLANT
LOOP CUT BIPOLAR 24F LRG (ELECTROSURGICAL) IMPLANT
PACK CYSTO AR (MISCELLANEOUS) ×3 IMPLANT
SET IRRIG Y TYPE TUR BLADDER L (SET/KITS/TRAYS/PACK) ×3 IMPLANT
SURGILUBE 2OZ TUBE FLIPTOP (MISCELLANEOUS) ×3 IMPLANT
SYRINGE IRR TOOMEY STRL 70CC (SYRINGE) ×3 IMPLANT
WATER STERILE IRR 1000ML POUR (IV SOLUTION) ×3 IMPLANT

## 2019-05-15 NOTE — Transfer of Care (Signed)
Immediate Anesthesia Transfer of Care Note  Patient: John [name]  Procedure(s) Performed: TRANSURETHRAL RESECTION OF BLADDER TUMOR (TURBT) (N/A )  Patient Location: PACU  Anesthesia Type:General  Level of Consciousness: sedated  Airway & Oxygen Therapy: Patient connected to face mask oxygen  Post-op Assessment: Report given to RN and Post -op Vital signs reviewed and stable  Post vital signs: stable  Last Vitals:  Vitals Value Taken Time  BP 131/65 05/15/19 1541  Temp    Pulse 69 05/15/19 1542  Resp 13 05/15/19 1542  SpO2 100 % 05/15/19 1542  Vitals shown include unvalidated device data.  Last Pain:  Vitals:   05/15/19 1209  TempSrc: Temporal  PainSc: 0-No pain         Complications: No apparent anesthesia complications

## 2019-05-15 NOTE — Anesthesia Preprocedure Evaluation (Signed)
Anesthesia Evaluation  Patient identified by MRN, date of birth, ID band Patient awake    Reviewed: Allergy & Precautions, NPO status , Patient's Chart, lab work & pertinent test results  History of Anesthesia Complications Negative for: history of anesthetic complications  Airway Mallampati: II  TM Distance: >3 FB Neck ROM: Full    Dental  (+) Edentulous Upper, Missing, Poor Dentition   Pulmonary neg sleep apnea, neg COPD, former smoker,    breath sounds clear to auscultation- rhonchi (-) wheezing      Cardiovascular hypertension, + CAD, + CABG, + Peripheral Vascular Disease and +CHF  (-) Cardiac Stents + Valvular Problems/Murmurs (s/p AVR)  Rhythm:Regular Rate:Normal - Systolic murmurs and - Diastolic murmurs    Neuro/Psych neg Seizures negative neurological ROS  negative psych ROS   GI/Hepatic negative GI ROS, Neg liver ROS,   Endo/Other  diabetes, Oral Hypoglycemic Agents  Renal/GU Renal InsufficiencyRenal disease     Musculoskeletal  (+) Arthritis ,   Abdominal (+) - obese,   Peds  Hematology  (+) anemia ,   Anesthesia Other Findings Past Medical History: No date: Anemia No date: Anginal pain ([place]) No date: Aortic stenosis No date: Cancer [facility])     Comment:  bladder No date: Cataracts, bilateral No date: CHF (congestive heart failure) (HCC) No date: Chronic kidney disease No date: Coronary artery disease No date: Diabetes mellitus without complication (HCC)     Comment:  takes Janumet daily...dx approx. 2009 No date: Dyspnea No date: Dysrhythmia No date: Heart murmur No date: History of colon polyps     Comment:  benign No date: Hyperlipidemia     Comment:  takes Lovastatin daily No date: Hypertension     Comment:  takes Hyzaar and Amlodipine daily No date: Joint pain No date: Medical history non-contributory No date: Muscle cramps     Comment:  occasionally in legs  No date: Osteoarthritis No  date: Pre-diabetes 03/13/2018: Senile purpura (HCC) No date: Skin cancer of face No date: Urinary urgency   Reproductive/Obstetrics                             Anesthesia Physical Anesthesia Plan  ASA: III  Anesthesia Plan: General   Post-op Pain Management:    Induction: Intravenous  PONV Risk Score and Plan: 1 and Ondansetron and Dexamethasone  Airway Management Planned: LMA  Additional Equipment:   Intra-op Plan:   Post-operative Plan:   Informed Consent: I have reviewed the patients History and Physical, chart, labs and discussed the procedure including the risks, benefits and alternatives for the proposed anesthesia with the patient or authorized representative who has indicated his/her understanding and acceptance.     Dental advisory given  Plan Discussed with: CRNA and Anesthesiologist  Anesthesia Plan Comments:         Anesthesia Quick Evaluation

## 2019-05-15 NOTE — Interval H&P Note (Signed)
History and Physical Interval Note:  05/15/2019 2:32 PM  John [name]  has presented today for surgery, with the diagnosis of bladder tumor.  The various methods of treatment have been discussed with the patient and family. After consideration of risks, benefits and other options for treatment, the patient has consented to  Procedure(s): TRANSURETHRAL RESECTION OF BLADDER TUMOR (TURBT) (N/A) as a surgical intervention.  The patient's history has been reviewed, patient examined, no change in status, stable for surgery.  I have reviewed the patient's chart and labs.  Questions were answered to the patient's satisfaction.     [place]

## 2019-05-15 NOTE — Op Note (Signed)
Preoperative diagnosis: 1. Bladder tumor (medium)  Postoperative diagnosis:  1. Bladder tumor (medium)  Procedure:  1. Transurethral resection of bladder tumor (medium)  Surgeon: Nicki Reaper C. Demesha Boorman, M.D.  Anesthesia: General  Complications: None  Intraoperative findings:  1. Bladder tumor: 3 cm papillary bladder tumor right posterior wall   EBL: Minimal  Specimens: 1. Bladder tumor 2. Base of tumor      Indication: John [name] is a is an 79 y.o male with the following urologic history: 03/2013: distal right ureterectomy and reimplant for low grade UTUC 03/2014: TURBT, LGTa 04/2016: TURBT, PUNLMP 03/2018: TURBT,LGTa; post resection gemcitabine  Recent renal cystoscopy remarkable for papillary tumor right posterior wall inferior to his reimplanted right ureteral orifice. After reviewing the management options for treatment, he elected to proceed with the above surgical procedure(s). We have discussed the potential benefits and risks of the procedure, side effects of the proposed treatment, the likelihood of the patient achieving the goals of the procedure, and any potential problems that might occur during the procedure or recuperation. Informed consent has been obtained.  Description of procedure:  The patient was taken to the operating room and general anesthesia was induced.  The patient was placed in the dorsal lithotomy position, prepped and draped in the usual sterile fashion, and preoperative antibiotics were administered. A preoperative time-out was performed.   A 26 French continuous-flow resectoscope sheath with visual obturator was lubricated and passed per urethra.  The urethra was normal in caliber without stricture.  The prostate demonstrated moderate lateral enlargement with moderate bladder neck elevation.  The visual obturator was replaced with an Beatrix Fetters resectoscope with loop and the bladder mucosa was inspected.  The papillary tumor was identified on the  right posterior wall.  There was a broad area of faint mucosa with early papillary change on the lower right posterior wall.  No other abnormalities were identified.  The upper posterior wall bladder tumor was resected in its entirety and the specimen removed with syringe irrigation.  Deeper biopsies with cold cup forceps were then taken of the base and sent separately.  The loop was replaced with a button electrode and the resection site was fulgurated.  The broad area early papillary change was also fulgurated.  No other mucosal abnormalities were identified.   The bladder was then emptied and the resectoscope was removed.  The patient appeared to tolerate the procedure well and without complications.  After anesthetic reversal he was transported to the PACU in stable condition.  Plan: -A Foley catheter was not placed -He will be contacted with his pathology results and further recommendations regarding intravesical therapy pending.   Irvine Glorioso C. Bernardo Heater,  MD

## 2019-05-15 NOTE — H&P (Signed)
05/15/2019 2:29 PM   John [name] John [name], John [name] [id number]  Urologic cancer history: 03/2013: distal right ureterectomy and reimplant for low grade UTUC 03/2014: TURBT, LGTa 04/2016: TURBT, PUNLMP 03/2018: TURBT,LGTa; post resection gemcitabine  HPI: 79 y.o. male with a history of recurrent transitional cell carcinoma of the bladder.  Last tumor January 2020.  Recent surveillance cystoscopy December 2020 showed recurrent papillary tumor on the right posterior wall.  He presents for TURBT.   PMH: Past Medical History:  Diagnosis Date  . Anemia   . Anginal pain ([place])   . Aortic stenosis   . Cancer ([place])    bladder  . Cataracts, bilateral   . CHF (congestive heart failure) ([place])   . Chronic kidney disease   . Coronary artery disease   . Diabetes mellitus without complication ([place])    takes Janumet daily...dx approx. 2009  . Dyspnea   . Dysrhythmia   . Heart murmur   . History of colon polyps    benign  . Hyperlipidemia    takes Lovastatin daily  . Hypertension    takes Hyzaar and Amlodipine daily  . Joint pain   . Medical history non-contributory   . Muscle cramps    occasionally in legs   . Osteoarthritis   . Pre-diabetes   . Senile purpura ([place]) 03/13/2018  . Skin cancer of face   . Urinary urgency     Surgical History: Past Surgical History:  Procedure Laterality Date  . AORTIC VALVE REPLACEMENT N/A 05/05/2017   Procedure: AORTIC VALVE REPLACEMENT (AVR) USING MAGNA EASE PERICARDIAL BIOPROSTHESIS - AORTIC VALVE MODEL 3300TFX, SIZE 23 MM;  Surgeon: Gaye Pollack, MD;  Location: [facility];  Service: Open Heart Surgery;  Laterality: N/A;  . BLADDER SURGERY    . CARDIAC VALVE REPLACEMENT    . COLONOSCOPY    . CORONARY ARTERY BYPASS GRAFT N/A 05/05/2017   Procedure: CORONARY ARTERY BYPASS GRAFTING (CABG) x 4 WITH ENDOSCOPIC HARVESTING OF RIGHT SAPHENOUS VEIN;  Surgeon: Gaye Pollack, MD;  Location: [facility];  Service: Open Heart Surgery;  Laterality: N/A;  .  CYSTOURETHROSCOPY    . JOINT REPLACEMENT Left    hip  . JOINT REPLACEMENT Right    knee  . KNEE ARTHROPLASTY Right [name]/13/2017   Procedure: RIGHT  TOTAL KNEE ARTHROPLASTY WITH COMPUTER NAVIGATION;  Surgeon: Rod Can, MD;  Location: [facility];  Service: Orthopedics;  Laterality: Right;  Needs RNFA  . NO PAST SURGERIES    . RIGHT/LEFT HEART CATH AND CORONARY ANGIOGRAPHY N/A 04/21/2017   Procedure: RIGHT/LEFT HEART CATH AND CORONARY ANGIOGRAPHY;  Surgeon: Isaias Cowman, MD;  Location: [facility];  Service: Cardiovascular;  Laterality: N/A;  . SKIN SURGERY Left    arm  . TEE WITHOUT CARDIOVERSION N/A 05/05/2017   Procedure: TRANSESOPHAGEAL ECHOCARDIOGRAM (TEE);  Surgeon: Gaye Pollack, MD;  Location: [facility];  Service: Open Heart Surgery;  Laterality: N/A;  . TRANSURETHRAL RESECTION OF BLADDER TUMOR WITH MITOMYCIN-C N/A 03/14/2018   Procedure: TRANSURETHRAL RESECTION OF BLADDER TUMOR WITH Gemcitabine;  Surgeon: Abbie Sons, MD;  Location: [facility];  Service: Urology;  Laterality: N/A;    Home Medications:  Reviewed  Allergies: No Known Allergies  Family History: Family History  Problem Relation Age of Onset  . Dementia Mother   . Stroke Mother   . Stroke Father   . Diabetes Father   . Heart disease Father   . Cancer Sister   . Hypertension Brother   . Cancer Sister  Social History:  reports that he quit smoking about 38 years ago. He quit after 25.00 years of use. He has never used smokeless tobacco. He reports that he does not drink alcohol or use drugs.   Physical Exam: BP (!) 161/76   Pulse (!) 53   Temp (!) 97.3 F (36.3 C) (Temporal)   Resp 16   SpO2 98%   Constitutional:  Alert and oriented, No acute distress. HEENT: [place] AT, moist mucus membranes.  Trachea midline, no masses. Cardiovascular: No clubbing, cyanosis, or edema.  RRR Respiratory: Normal respiratory effort, no increased work of breathing.  Clear GI: Abdomen is soft, nontender,  nondistended, no abdominal masses GU: No CVA tenderness Lymph: No cervical or inguinal lymphadenopathy. Skin: No rashes, bruises or suspicious lesions.   Assessment & Plan:    - Bladder tumor Recent cystoscopy consistent with recurrent urothelial carcinoma the bladder.  He presents for TURBT. The indications and nature of the planned procedure were discussed as well as the potential benefits and expected outcome.  Alternatives have been discussed.  Potential complications were discussed including but not limited to bleeding, infection and bladder perforation. The postoperative care and followup was reviewed.  The patient was informed that he may need additional treatment along with periodic surveillance cystoscopy.  All of his questions were answered and he desires to proceed.    Abbie Sons, [place] [address] [place], [place] [address] [phone]

## 2019-05-15 NOTE — Discharge Instructions (Signed)
AMBULATORY SURGERY  DISCHARGE INSTRUCTIONS   1) The drugs that you were given will stay in your system until tomorrow so for the next 24 hours you should not:  A) Drive an automobile B) Make any legal decisions C) Drink any alcoholic beverage   2) You may resume regular meals tomorrow.  Today it is better to start with liquids and gradually work up to solid foods.  You may eat anything you prefer, but it is better to start with liquids, then soup and crackers, and gradually work up to solid foods.   3) Please notify your doctor immediately if you have any unusual bleeding, trouble breathing, redness and pain at the surgery site, drainage, fever, or pain not relieved by medication. 4)   5) Your post-operative visit with Dr.                                     is: Date:                        Time:    Please call to schedule your post-operative visit.  6) Additional Instructions:     .Transurethral Resection of Bladder Tumor (TURBT) or Bladder Biopsy   Definition:  Transurethral Resection of the Bladder Tumor is a surgical procedure used to diagnose and remove tumors within the bladder.   General instructions:     Your recent bladder surgery requires very little post hospital care but some definite precautions.  Despite the fact that no skin incisions were used, the area around the bladder incisions are raw and covered with scabs to promote healing and prevent bleeding. Certain precautions are needed to insure that the scabs are not disturbed over the next 2-4 weeks while the healing proceeds.  Because the raw surface inside your bladder and the irritating effects of urine you may expect frequency of urination and/or urgency (a stronger desire to urinate) and perhaps even getting up at night more often. This will usually resolve or improve slowly over the healing period. You may see some blood in your urine over the first 6 weeks. Do not be alarmed, even if the urine was clear  for a while. Get off your feet and drink lots of fluids until clearing occurs. If you start to pass clots or don't improve call us.  Diet:  You may return to your normal diet immediately. Because of the raw surface of your bladder, alcohol, spicy foods, foods high in acid and drinks with caffeine may cause irritation or frequency and should be used in moderation. To keep your urine flowing freely and avoid constipation, drink plenty of fluids during the day (8-10 glasses). Tip: Avoid cranberry juice because it is very acidic.  Activity:  Your physical activity doesn't need to be restricted. However, if you are very active, you may see some blood in the urine. We suggest that you reduce your activity under the circumstances until the bleeding has stopped.  Bowels:  It is important to keep your bowels regular during the postoperative period. Straining with bowel movements can cause bleeding. A bowel movement every other day is reasonable. Use a mild laxative if needed, such as milk of magnesia 2-3 tablespoons, or 2 Dulcolax tablets. Call if you continue to have problems. If you had been taking narcotics for pain, before, during or after your surgery, you may be constipated. Take a laxative if  necessary.    Medication:  You should resume your pre-surgery medications unless told not to. In addition you may be given an antibiotic to prevent or treat infection. Antibiotics are not always necessary. All medication should be taken as prescribed until the bottles are finished unless you are having an unusual reaction to one of the drugs.  -A prescription for pain medication was sent to your pharmacy   [facility] [address], [place], [place] [address] [phone]

## 2019-05-15 NOTE — Anesthesia Postprocedure Evaluation (Signed)
Anesthesia Post Note  Patient: John [name]  Procedure(s) Performed: TRANSURETHRAL RESECTION OF BLADDER TUMOR (TURBT) (N/A )  Patient location during evaluation: PACU Anesthesia Type: General Level of consciousness: awake and alert Pain management: pain level controlled Vital Signs Assessment: post-procedure vital signs reviewed and stable Respiratory status: spontaneous breathing, nonlabored ventilation and respiratory function stable Cardiovascular status: blood pressure returned to baseline and stable Postop Assessment: no apparent nausea or vomiting Anesthetic complications: no     Last Vitals:  Vitals:   05/15/19 1643 05/15/19 1658  BP: (!) 181/83   Pulse: 74 76  Resp: 17 12  Temp: 36.5 C (!) 36.2 C  SpO2: 96% 96%    Last Pain:  Vitals:   05/15/19 1658  TempSrc: Temporal  PainSc:                  Tera Mater

## 2019-05-17 LAB — SURGICAL PATHOLOGY

## 2019-05-23 ENCOUNTER — Ambulatory Visit: Payer: PPO | Admitting: Urology

## 2019-05-23 ENCOUNTER — Other Ambulatory Visit: Payer: Self-pay

## 2019-05-23 ENCOUNTER — Encounter: Payer: Self-pay | Admitting: Urology

## 2019-05-23 VITALS — BP 172/73 | HR 73 | Ht 71.0 in | Wt 211.5 lb

## 2019-05-23 DIAGNOSIS — C679 Malignant neoplasm of bladder, unspecified: Secondary | ICD-10-CM | POA: Diagnosis not present

## 2019-05-23 NOTE — Addendum Note (Signed)
Addended by: Verlene Mayer A on: 05/23/2019 02:51 PM   Modules accepted: Orders

## 2019-05-23 NOTE — Progress Notes (Signed)
05/23/2019 1:28 PM   [name] 12-06-1940 [id number]  Referring provider: Venita Lick, NP [address] [place],  [place] [address]  Chief Complaint  Patient presents with  . Routine Post Op    Urologic history: 03/2013: distal right ureterectomy and reimplant for low grade UTUC 03/2014: TURBT, LGTa 04/2016: TURBT, PUNLMP 03/2018: TURBT,LGTa; post resection gemcitabine  HPI: 79 yo male presents for postop follow-up.  -TURBT 3/16 for a 3 cm papillary bladder tumor right broad area of patchy early papillary change mucosal changes lower right posterior wall.  The early changes were fulgurated. -Fairly significant dysuria frequency 2 days after catheter removal which is improving -No other postoperative problems -Pathology: Low-grade Ta papillary urothelial carcinoma; no muscle invasion noted  PMH: Past Medical History:  Diagnosis Date  . Anemia   . Anginal pain ([place])   . Aortic stenosis   . Cancer ([place])    bladder  . Cataracts, bilateral   . CHF (congestive heart failure) ([place])   . Chronic kidney disease   . Coronary artery disease   . Diabetes mellitus without complication ([place])    takes Janumet daily...dx approx. 2009  . Dyspnea   . Dysrhythmia   . Heart murmur   . History of colon polyps    benign  . Hyperlipidemia    takes Lovastatin daily  . Hypertension    takes Hyzaar and Amlodipine daily  . Joint pain   . Medical history non-contributory   . Muscle cramps    occasionally in legs   . Osteoarthritis   . Pre-diabetes   . Senile purpura ([place]) 03/13/2018  . Skin cancer of face   . Urinary urgency     Surgical History: Past Surgical History:  Procedure Laterality Date  . AORTIC VALVE REPLACEMENT N/A 05/05/2017   Procedure: AORTIC VALVE REPLACEMENT (AVR) USING MAGNA EASE PERICARDIAL BIOPROSTHESIS - AORTIC VALVE MODEL 3300TFX, SIZE 23 MM;  Surgeon: Gaye Pollack, MD;  Location: [facility];  Service: Open Heart Surgery;  Laterality: N/A;  . BLADDER  SURGERY    . CARDIAC VALVE REPLACEMENT    . COLONOSCOPY    . CORONARY ARTERY BYPASS GRAFT N/A 05/05/2017   Procedure: CORONARY ARTERY BYPASS GRAFTING (CABG) x 4 WITH ENDOSCOPIC HARVESTING OF RIGHT SAPHENOUS VEIN;  Surgeon: Gaye Pollack, MD;  Location: [facility];  Service: Open Heart Surgery;  Laterality: N/A;  . CYSTOURETHROSCOPY    . JOINT REPLACEMENT Left    hip  . JOINT REPLACEMENT Right    knee  . KNEE ARTHROPLASTY Right 01/12/2016   Procedure: RIGHT  TOTAL KNEE ARTHROPLASTY WITH COMPUTER NAVIGATION;  Surgeon: Rod Can, MD;  Location: [facility];  Service: Orthopedics;  Laterality: Right;  Needs RNFA  . NO PAST SURGERIES    . RIGHT/LEFT HEART CATH AND CORONARY ANGIOGRAPHY N/A 04/21/2017   Procedure: RIGHT/LEFT HEART CATH AND CORONARY ANGIOGRAPHY;  Surgeon: Isaias Cowman, MD;  Location: [facility];  Service: Cardiovascular;  Laterality: N/A;  . SKIN SURGERY Left    arm  . TEE WITHOUT CARDIOVERSION N/A 05/05/2017   Procedure: TRANSESOPHAGEAL ECHOCARDIOGRAM (TEE);  Surgeon: Gaye Pollack, MD;  Location: [facility];  Service: Open Heart Surgery;  Laterality: N/A;  . TRANSURETHRAL RESECTION OF BLADDER TUMOR N/A 05/15/2019   Procedure: TRANSURETHRAL RESECTION OF BLADDER TUMOR (TURBT);  Surgeon: Abbie Sons, MD;  Location: [facility];  Service: Urology;  Laterality: N/A;  . TRANSURETHRAL RESECTION OF BLADDER TUMOR WITH MITOMYCIN-C N/A 03/14/2018   Procedure: TRANSURETHRAL  RESECTION OF BLADDER TUMOR WITH Gemcitabine;  Surgeon: Abbie Sons, MD;  Location: [facility];  Service: Urology;  Laterality: N/A;    Home Medications:  Allergies as of 05/23/2019   No Known Allergies     Medication List       Accurate as of May 23, 2019  1:28 PM. If you have any questions, ask your nurse or doctor.        aspirin 81 MG EC tablet Take 1 tablet (81 mg total) by mouth daily.   atorvastatin 40 MG tablet Commonly known as: LIPITOR Take 1 tablet (40 mg total) by mouth daily at 6  PM.   furosemide 40 MG tablet Commonly known as: LASIX Take 1/2 (one-half) tablet by mouth once daily What changed:   how much to take  how to take this  when to take this  additional instructions   HYDROcodone-acetaminophen 5-325 MG tablet Commonly known as: NORCO/VICODIN Take 1 tablet by mouth every 6 (six) hours as needed for moderate pain.   Iron (Ferrous Sulfate) 142 (45 Fe) MG Tbcr Take 45 mg by mouth daily.   Janumet XR 50-1000 MG Tb24 Generic drug: SitaGLIPtin-MetFORMIN HCl Take 1 tablet by mouth 2 (two) times daily.   Klor-Con M10 10 MEQ tablet Generic drug: potassium chloride Take 1 tablet (10 mEq total) by mouth daily.   losartan 25 MG tablet Commonly known as: COZAAR Take 0.5 tablets (12.5 mg total) by mouth daily.   metoprolol tartrate 25 MG tablet Commonly known as: LOPRESSOR Take 1 tablet (25 mg total) by mouth 2 (two) times daily.   naproxen sodium 220 MG tablet Commonly known as: ALEVE Take 220 mg by mouth daily as needed (pain).   RA Vitamin B-12 TR 1000 MCG Tbcr Generic drug: Cyanocobalamin Take 1,000 mcg by mouth daily.   vitamin C 1000 MG tablet Take 1,000 mg by mouth daily.   Vitamin D3 50 MCG (2000 UT) Tabs Take 2,000 Units by mouth daily.       Allergies: No Known Allergies  Family History: Family History  Problem Relation Age of Onset  . Dementia Mother   . Stroke Mother   . Stroke Father   . Diabetes Father   . Heart disease Father   . Cancer Sister   . Hypertension Brother   . Cancer Sister     Social History:  reports that he quit smoking about 38 years ago. He quit after 25.00 years of use. He has never used smokeless tobacco. He reports that he does not drink alcohol or use drugs.   Physical Exam: BP (!) 172/73 (BP Location: Left Arm, Patient Position: Sitting, Cuff Size: Normal)   Pulse 73   Ht 5\' 11"  (1.803 m)   Wt 211 lb 8 oz (95.9 kg)   BMI 29.50 kg/m   Constitutional:  Alert and oriented, No acute  distress.   Assessment & Plan:    - Recurrent urothelial carcinoma Multifocal low-grade recurrence which puts him in the intermediate risk stratification.  Discussed that he is at risk for continued recurrence without additional treatment and recommended a 6-week course of intravesical gemcitabine to begin in approximately 1 month.  He would like to think this over and will check back with him in approximately 1 week.  Urinalysis today with microhematuria and no pyuria/bacteria.   Abbie Sons, [place] [address] [place], [place] [address] [phone]

## 2019-05-24 LAB — URINALYSIS, COMPLETE
Bilirubin, UA: NEGATIVE
Glucose, UA: NEGATIVE
Ketones, UA: NEGATIVE
Leukocytes,UA: NEGATIVE
Nitrite, UA: NEGATIVE
Protein,UA: NEGATIVE
Specific Gravity, UA: 1.02 (ref 1.005–1.030)
Urobilinogen, Ur: 0.2 mg/dL (ref 0.2–1.0)
pH, UA: 6 (ref 5.0–7.5)

## 2019-05-24 LAB — MICROSCOPIC EXAMINATION: Bacteria, UA: NONE SEEN

## 2019-06-01 ENCOUNTER — Ambulatory Visit (INDEPENDENT_AMBULATORY_CARE_PROVIDER_SITE_OTHER): Payer: PPO | Admitting: Nurse Practitioner

## 2019-06-01 ENCOUNTER — Other Ambulatory Visit: Payer: Self-pay

## 2019-06-01 ENCOUNTER — Encounter: Payer: Self-pay | Admitting: Nurse Practitioner

## 2019-06-01 VITALS — BP 128/70 | HR 59 | Temp 97.6°F | Wt 211.0 lb

## 2019-06-01 DIAGNOSIS — N1831 Chronic kidney disease, stage 3a: Secondary | ICD-10-CM | POA: Diagnosis not present

## 2019-06-01 DIAGNOSIS — E785 Hyperlipidemia, unspecified: Secondary | ICD-10-CM

## 2019-06-01 DIAGNOSIS — E1169 Type 2 diabetes mellitus with other specified complication: Secondary | ICD-10-CM

## 2019-06-01 DIAGNOSIS — E1121 Type 2 diabetes mellitus with diabetic nephropathy: Secondary | ICD-10-CM

## 2019-06-01 DIAGNOSIS — I130 Hypertensive heart and chronic kidney disease with heart failure and stage 1 through stage 4 chronic kidney disease, or unspecified chronic kidney disease: Secondary | ICD-10-CM

## 2019-06-01 DIAGNOSIS — D692 Other nonthrombocytopenic purpura: Secondary | ICD-10-CM | POA: Diagnosis not present

## 2019-06-01 DIAGNOSIS — I350 Nonrheumatic aortic (valve) stenosis: Secondary | ICD-10-CM

## 2019-06-01 DIAGNOSIS — C679 Malignant neoplasm of bladder, unspecified: Secondary | ICD-10-CM

## 2019-06-01 LAB — MICROALBUMIN, URINE WAIVED
Creatinine, Urine Waived: 100 mg/dL (ref 10–300)
Microalb, Ur Waived: 150 mg/L — ABNORMAL HIGH (ref 0–19)
Microalb/Creat Ratio: >300 mg/g — ABNORMAL HIGH (ref <30)

## 2019-06-01 LAB — BAYER DCA HB A1C WAIVED: HB A1C (BAYER DCA - WAIVED): 7.1 % — ABNORMAL HIGH (ref <7.0)

## 2019-06-01 NOTE — Assessment & Plan Note (Addendum)
Continue to collaborate with urology, at length discussion on goals of care will continue at visits. 

## 2019-06-01 NOTE — Assessment & Plan Note (Signed)
As evidenced by scattered bruises bilaterall upper extremities.  Gentle skin cleansers at home and lotion.  Monitor for skin breakdown, report to provider if present. 

## 2019-06-01 NOTE — Patient Instructions (Signed)
Carbohydrate Counting for Diabetes Mellitus, Adult  Carbohydrate counting is a method of keeping track of how many carbohydrates you eat. Eating carbohydrates naturally increases the amount of sugar (glucose) in the blood. Counting how many carbohydrates you eat helps keep your blood glucose within normal limits, which helps you manage your diabetes (diabetes mellitus). It is important to know how many carbohydrates you can safely have in each meal. This is different for every person. A diet and nutrition specialist (registered dietitian) can help you make a meal plan and calculate how many carbohydrates you should have at each meal and snack. Carbohydrates are found in the following foods:  Grains, such as breads and cereals.  Dried beans and soy products.  Starchy vegetables, such as potatoes, peas, and corn.  Fruit and fruit juices.  Milk and yogurt.  Sweets and snack foods, such as cake, cookies, candy, chips, and soft drinks. How do I count carbohydrates? There are two ways to count carbohydrates in food. You can use either of the methods or a combination of both. Reading "Nutrition Facts" on packaged food The "Nutrition Facts" list is included on the labels of almost all packaged foods and beverages in the [place] It includes:  The serving size.  Information about nutrients in each serving, including the grams (g) of carbohydrate per serving. To use the "Nutrition Facts":  Decide how many servings you will have.  Multiply the number of servings by the number of carbohydrates per serving.  The resulting number is the total amount of carbohydrates that you will be having. Learning standard serving sizes of other foods When you eat carbohydrate foods that are not packaged or do not include "Nutrition Facts" on the label, you need to measure the servings in order to count the amount of carbohydrates:  Measure the foods that you will eat with a food scale or measuring cup, if  needed.  Decide how many standard-size servings you will eat.  Multiply the number of servings by 15. Most carbohydrate-rich foods have about 15 g of carbohydrates per serving. ? For example, if you eat 8 oz (170 g) of strawberries, you will have eaten 2 servings and 30 g of carbohydrates (2 servings x 15 g = 30 g).  For foods that have more than one food mixed, such as soups and casseroles, you must count the carbohydrates in each food that is included. The following list contains standard serving sizes of common carbohydrate-rich foods. Each of these servings has about 15 g of carbohydrates:   hamburger bun or  English muffin.   oz (15 mL) syrup.   oz (14 g) jelly.  1 slice of bread.  1 six-inch tortilla.  3 oz (85 g) cooked rice or pasta.  4 oz (113 g) cooked dried beans.  4 oz (113 g) starchy vegetable, such as peas, corn, or potatoes.  4 oz (113 g) hot cereal.  4 oz (113 g) mashed potatoes or  of a large baked potato.  4 oz (113 g) canned or frozen fruit.  4 oz (120 mL) fruit juice.  4-6 crackers.  6 chicken nuggets.  6 oz (170 g) unsweetened dry cereal.  6 oz (170 g) plain fat-free yogurt or yogurt sweetened with artificial sweeteners.  8 oz (240 mL) milk.  8 oz (170 g) fresh fruit or one small piece of fruit.  24 oz (680 g) popped popcorn. Example of carbohydrate counting Sample meal  3 oz (85 g) chicken breast.  6 oz (170 g)   brown rice.  4 oz (113 g) corn.  8 oz (240 mL) milk.  8 oz (170 g) strawberries with sugar-free whipped topping. Carbohydrate calculation 1. Identify the foods that contain carbohydrates: ? Rice. ? Corn. ? Milk. ? Strawberries. 2. Calculate how many servings you have of each food: ? 2 servings rice. ? 1 serving corn. ? 1 serving milk. ? 1 serving strawberries. 3. Multiply each number of servings by 15 g: ? 2 servings rice x 15 g = 30 g. ? 1 serving corn x 15 g = 15 g. ? 1 serving milk x 15 g = 15 g. ? 1  serving strawberries x 15 g = 15 g. 4. Add together all of the amounts to find the total grams of carbohydrates eaten: ? 30 g + 15 g + 15 g + 15 g = 75 g of carbohydrates total. Summary  Carbohydrate counting is a method of keeping track of how many carbohydrates you eat.  Eating carbohydrates naturally increases the amount of sugar (glucose) in the blood.  Counting how many carbohydrates you eat helps keep your blood glucose within normal limits, which helps you manage your diabetes.  A diet and nutrition specialist (registered dietitian) can help you make a meal plan and calculate how many carbohydrates you should have at each meal and snack. This information is not intended to replace advice given to you by your health care provider. Make sure you discuss any questions you have with your health care provider. Document Revised: 09/09/2016 Document Reviewed: 07/30/2015 Elsevier Patient Education  2020 Elsevier Inc.  

## 2019-06-01 NOTE — Assessment & Plan Note (Signed)
Chronic, stable with recent labs showing improved CRT and GFR.  Continue Losartan for kidney protection.  BMP today.  Refer to nephrology as needed.

## 2019-06-01 NOTE — Assessment & Plan Note (Addendum)
Chronic, ongoing with A1C 7.1% today, slightly elevated from previous.  He endorses snacking more on candy and will cut back some.  Urine ALB 150 and A:C >300.  Losartan for kidney protection.  BMP today.  Continue current medication regimen and adjust as needed.  Return in 3 months.  Avoid hypoglycemia due to advanced age.  Will refer him to nephrology if worsening kidney function presents.

## 2019-06-01 NOTE — Assessment & Plan Note (Signed)
Chronic, ongoing.  Continue current medication regimen and adjust as needed. Lipid panel today. 

## 2019-06-01 NOTE — Assessment & Plan Note (Signed)
Chronic, ongoing.  Initial BPs elevated, but repeat manually at goal and home readings in past at goal for age.  Avoid hypotension. Continue current medication regimen and adjust as needed.  BMP today.  Urine ALB 150 and A:C >300.  Losartan for kidney protection.  Refer to nephrology if worsening kidney function.

## 2019-06-01 NOTE — Assessment & Plan Note (Signed)
Followed by cardiology.  Last echo 04/11/17.  Continue to collaborate with cardiology.

## 2019-06-01 NOTE — Progress Notes (Signed)
BP 128/70 (BP Location: Left Arm)   Pulse (!) 59   Temp 97.6 F (36.4 C) (Oral)   Wt 211 lb (95.7 kg)   SpO2 97%   BMI 29.43 kg/m    Subjective:    Patient ID: John [name], male    DOB: February 20, 1941, 79 y.o.   MRN: [id number]  HPI: John [name] is a 79 y.o. male  Chief Complaint  Patient presents with  . Diabetes  . Hypertension  . Hyperlipidemia   DIABETES Continues on Janumet XR 50-1000. Last A1C was 6.9% December. Hypoglycemic episodes:no Polydipsia/polyuria:no Visual disturbance:no Chest pain:no Paresthesias:no Glucose Monitoring:no Accucheck frequency: Not Checking Fasting glucose: Post prandial: Evening: Before meals: Taking Insulin?:no Long acting insulin: Short acting insulin: Blood Pressure Monitoring:rarely Retinal Examination:Not Up To Date Foot Exam:Up to Date Pneumovax:Up to Date Influenza:Up to Date Aspirin:yes  HYPERTENSION / HYPERLIPIDEMIA/HF Continues on Lasix and Metoprolol + Losartan + Lipitor, ASA. Last saw Dr. Josefa Half 01/11/2019 with no medications changes. Has moderate underlying aortic stenosis noted on past imaging, which is monitored by cardiology.  Last echo 04/11/17 with EF 40-45% Satisfied with current treatment?yes Duration of hypertension:chronic BP monitoring frequency:not checking BP range: BP medication side effects:no Duration of hyperlipidemia:chronic Cholesterol medication side effects:no Cholesterol supplements: none Medication compliance:good compliance Aspirin:yes Recent stressors:no Recurrent headaches:no Visual changes:no Palpitations:no Dyspnea:no Chest pain:no Lower extremity edema:yes, at baseline, reports this is improving Dizzy/lightheaded:no  BLADDER CANCER: Monitor by urology with last visit with Dr. Bernardo Heater on 05/23/2019 and had recent TURBT on 05/15/2019.  He does endorse that  each time he has these procedures it takes longer to recover, so he is pondering after this procedure continuing further is needed.  Mr. Connelly discussed the treatment that they want to do, 6-week course of intravesical gemcitabine, and he is unsure if he wants to go through this.  Reports he has a week or two to think about it.  Relevant past medical, surgical, family and social history reviewed and updated as indicated. Interim medical history since our last visit reviewed. Allergies and medications reviewed and updated.  Review of Systems  Constitutional: Negative for activity change, diaphoresis, fatigue and fever.  Respiratory: Negative for cough, chest tightness, shortness of breath and wheezing.   Cardiovascular: Negative for chest pain, palpitations and leg swelling.  Gastrointestinal: Negative.   Endocrine: Negative for cold intolerance, heat intolerance, polydipsia, polyphagia and polyuria.  Neurological: Negative.   Psychiatric/Behavioral: Negative.     Per HPI unless specifically indicated above     Objective:    BP 128/70 (BP Location: Left Arm)   Pulse (!) 59   Temp 97.6 F (36.4 C) (Oral)   Wt 211 lb (95.7 kg)   SpO2 97%   BMI 29.43 kg/m   Wt Readings from Last 3 Encounters:  06/01/19 211 lb (95.7 kg)  05/23/19 211 lb 8 oz (95.9 kg)  05/03/19 208 lb (94.3 kg)    Physical Exam Vitals and nursing note reviewed.  Constitutional:      General: He is awake. He is not in acute distress.    Appearance: He is well-developed and overweight. He is not ill-appearing.  HENT:     Head: Normocephalic and atraumatic.     Right Ear: Hearing normal. No drainage.     Left Ear: Hearing normal. No drainage.  Eyes:     General: Lids are normal.        Right eye: No discharge.        Left eye: No discharge.  Conjunctiva/sclera: Conjunctivae normal.     Pupils: Pupils are equal, round, and reactive to light.  Neck:     Thyroid: No thyromegaly.     Vascular: No carotid  bruit.  Cardiovascular:     Rate and Rhythm: Normal rate and regular rhythm.     Heart sounds: S1 normal and S2 normal. Murmur present. Systolic murmur present with a grade of 2/6. No gallop.   Pulmonary:     Effort: Pulmonary effort is normal. No accessory muscle usage or respiratory distress.     Breath sounds: Normal breath sounds.  Abdominal:     General: Bowel sounds are normal.     Palpations: Abdomen is soft.  Musculoskeletal:        General: Normal range of motion.     Cervical back: Normal range of motion and neck supple.     Right lower leg: Edema (1+) present.     Left lower leg: Edema (1+) present.  Skin:    General: Skin is warm and dry.     Capillary Refill: Capillary refill takes less than 2 seconds.     Comments: Scattered pale purple bruises noted to bilateral upper extremities, skin intact.  Neurological:     Mental Status: He is alert and oriented to person, place, and time.     Deep Tendon Reflexes: Reflexes are normal and symmetric.  Psychiatric:        Attention and Perception: Attention normal.        Mood and Affect: Mood normal.        Speech: Speech normal.        Behavior: Behavior normal. Behavior is cooperative.        Thought Content: Thought content normal.    Results for orders placed or performed in visit on 05/23/19  Microscopic Examination   URINE  Result Value Ref Range   WBC, UA 0-5 0 - 5 /hpf   RBC 11-30 (A) 0 - 2 /hpf   Epithelial Cells (non renal) 0-10 0 - 10 /hpf   Bacteria, UA None seen None seen/Few  Urinalysis, Complete  Result Value Ref Range   Specific Gravity, UA 1.020 1.005 - 1.030   pH, UA 6.0 5.0 - 7.5   Color, UA Straw Yellow   Appearance Ur Clear Clear   Leukocytes,UA Negative Negative   Protein,UA Negative Negative/Trace   Glucose, UA Negative Negative   Ketones, UA Negative Negative   RBC, UA 2+ (A) Negative   Bilirubin, UA Negative Negative   Urobilinogen, Ur 0.2 0.2 - 1.0 mg/dL   Nitrite, UA Negative Negative     Microscopic Examination See below:       Assessment & Plan:   Problem List Items Addressed This Visit      Cardiovascular and Mediastinum   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  Initial BPs elevated, but repeat manually at goal and home readings in past at goal for age.  Avoid hypotension. Continue current medication regimen and adjust as needed.  BMP today.  Urine ALB 150 and A:C >300.  Losartan for kidney protection.  Refer to nephrology if worsening kidney function.      Relevant Orders   Basic metabolic panel   Microalbumin, Urine Waived   Aortic valve stenosis, moderate    Followed by cardiology.  Last echo 04/11/17.  Continue to collaborate with cardiology.      Senile purpura ([place])    As evidenced by scattered  bruises bilaterall upper extremities.  Gentle skin cleansers at home and lotion.  Monitor for skin breakdown, report to provider if present.        Endocrine   Hyperlipidemia associated with type 2 diabetes mellitus (HCC)    Chronic, ongoing.  Continue current medication regimen and adjust as needed.  Lipid panel today.         Relevant Orders   Bayer DCA Hb A1c Waived   Lipid Panel w/o Chol/HDL Ratio   Type 2 diabetes mellitus with chronic kidney disease ([place]) - Primary    Chronic, ongoing with A1C 7.1% today, slightly elevated from previous.  He endorses snacking more on candy and will cut back some.  Urine ALB 150 and A:C >300.  Losartan for kidney protection.  BMP today.  Continue current medication regimen and adjust as needed.  Return in 3 months.  Avoid hypoglycemia due to advanced age.  Will refer him to nephrology if worsening kidney function presents.      Relevant Orders   Basic metabolic panel   Bayer DCA Hb A1c Waived   Microalbumin, Urine Waived     Genitourinary   Bladder cancer ([place])    Continue to collaborate with urology, at length discussion on goals of care will continue at visits.      CKD  (chronic kidney disease) stage 3, GFR 30-59 ml/min    Chronic, stable with recent labs showing improved CRT and GFR.  Continue Losartan for kidney protection.  BMP today.  Refer to nephrology as needed.          Follow up plan: Return in about 3 months (around 08/31/2019) for T2DM, HTN/HLD.

## 2019-06-02 LAB — BASIC METABOLIC PANEL
BUN/Creatinine Ratio: 14 (ref 10–24)
BUN: 14 mg/dL (ref 8–27)
CO2: 27 mmol/L (ref 20–29)
Calcium: 9 mg/dL (ref 8.6–10.2)
Chloride: 106 mmol/L (ref 96–106)
Creatinine, Ser: 1.02 mg/dL (ref 0.76–1.27)
GFR calc Af Amer: 79 mL/min/{1.73_m2} (ref >59)
GFR calc non Af Amer: 69 mL/min/{1.73_m2} (ref >59)
Glucose: 130 mg/dL — ABNORMAL HIGH (ref 65–99)
Potassium: 4.1 mmol/L (ref 3.5–5.2)
Sodium: 147 mmol/L — ABNORMAL HIGH (ref 134–144)

## 2019-06-02 LAB — LIPID PANEL W/O CHOL/HDL RATIO
Cholesterol, Total: 118 mg/dL (ref 100–199)
HDL: 45 mg/dL (ref >39)
LDL Chol Calc (NIH): 51 mg/dL (ref 0–99)
Triglycerides: 123 mg/dL (ref 0–149)
VLDL Cholesterol Cal: 22 mg/dL (ref 5–40)

## 2019-06-03 NOTE — Progress Notes (Signed)
Good morning.  Please let John [name] know his labs have returned.  Kidney function remains in stable range.  Sodium level continues to be a bit high, make sure you are drinking lots of water during daytime hours and eating food with less salt + putting less salt on foods.  We will continue to closely monitor this.  Cholesterol levels remain at goal.  Have a great day!!

## 2019-06-12 DIAGNOSIS — E119 Type 2 diabetes mellitus without complications: Secondary | ICD-10-CM | POA: Diagnosis not present

## 2019-06-12 LAB — HM DIABETES EYE EXAM

## 2019-07-03 IMAGING — CR DG CHEST 2V
2 series · 2 of 2 positions shown · non-contrast
Comparison: None available.

CLINICAL DATA: Preoperative evaluation for AVR on 05/05/2017.

EXAM:
CHEST - 2 VIEW

[w chest pa]
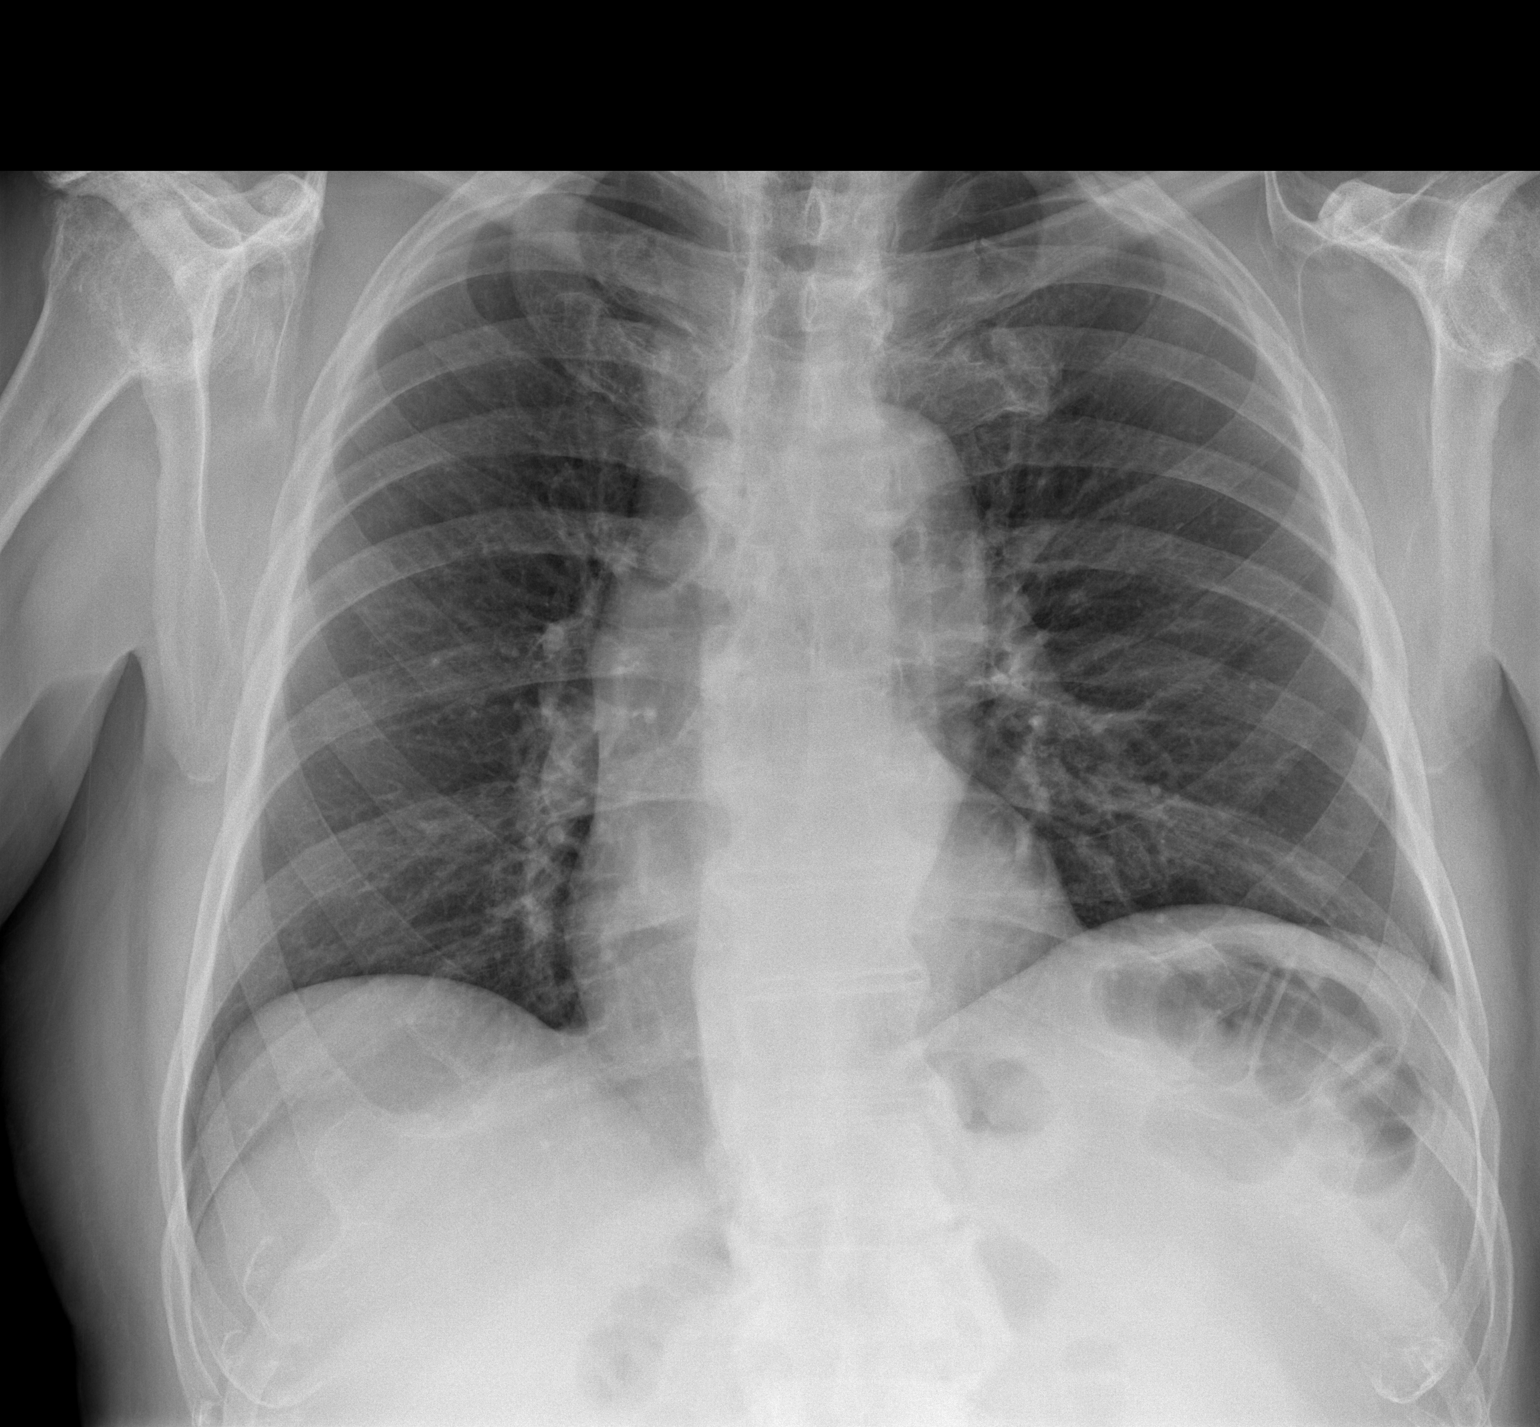

[w chest lat]
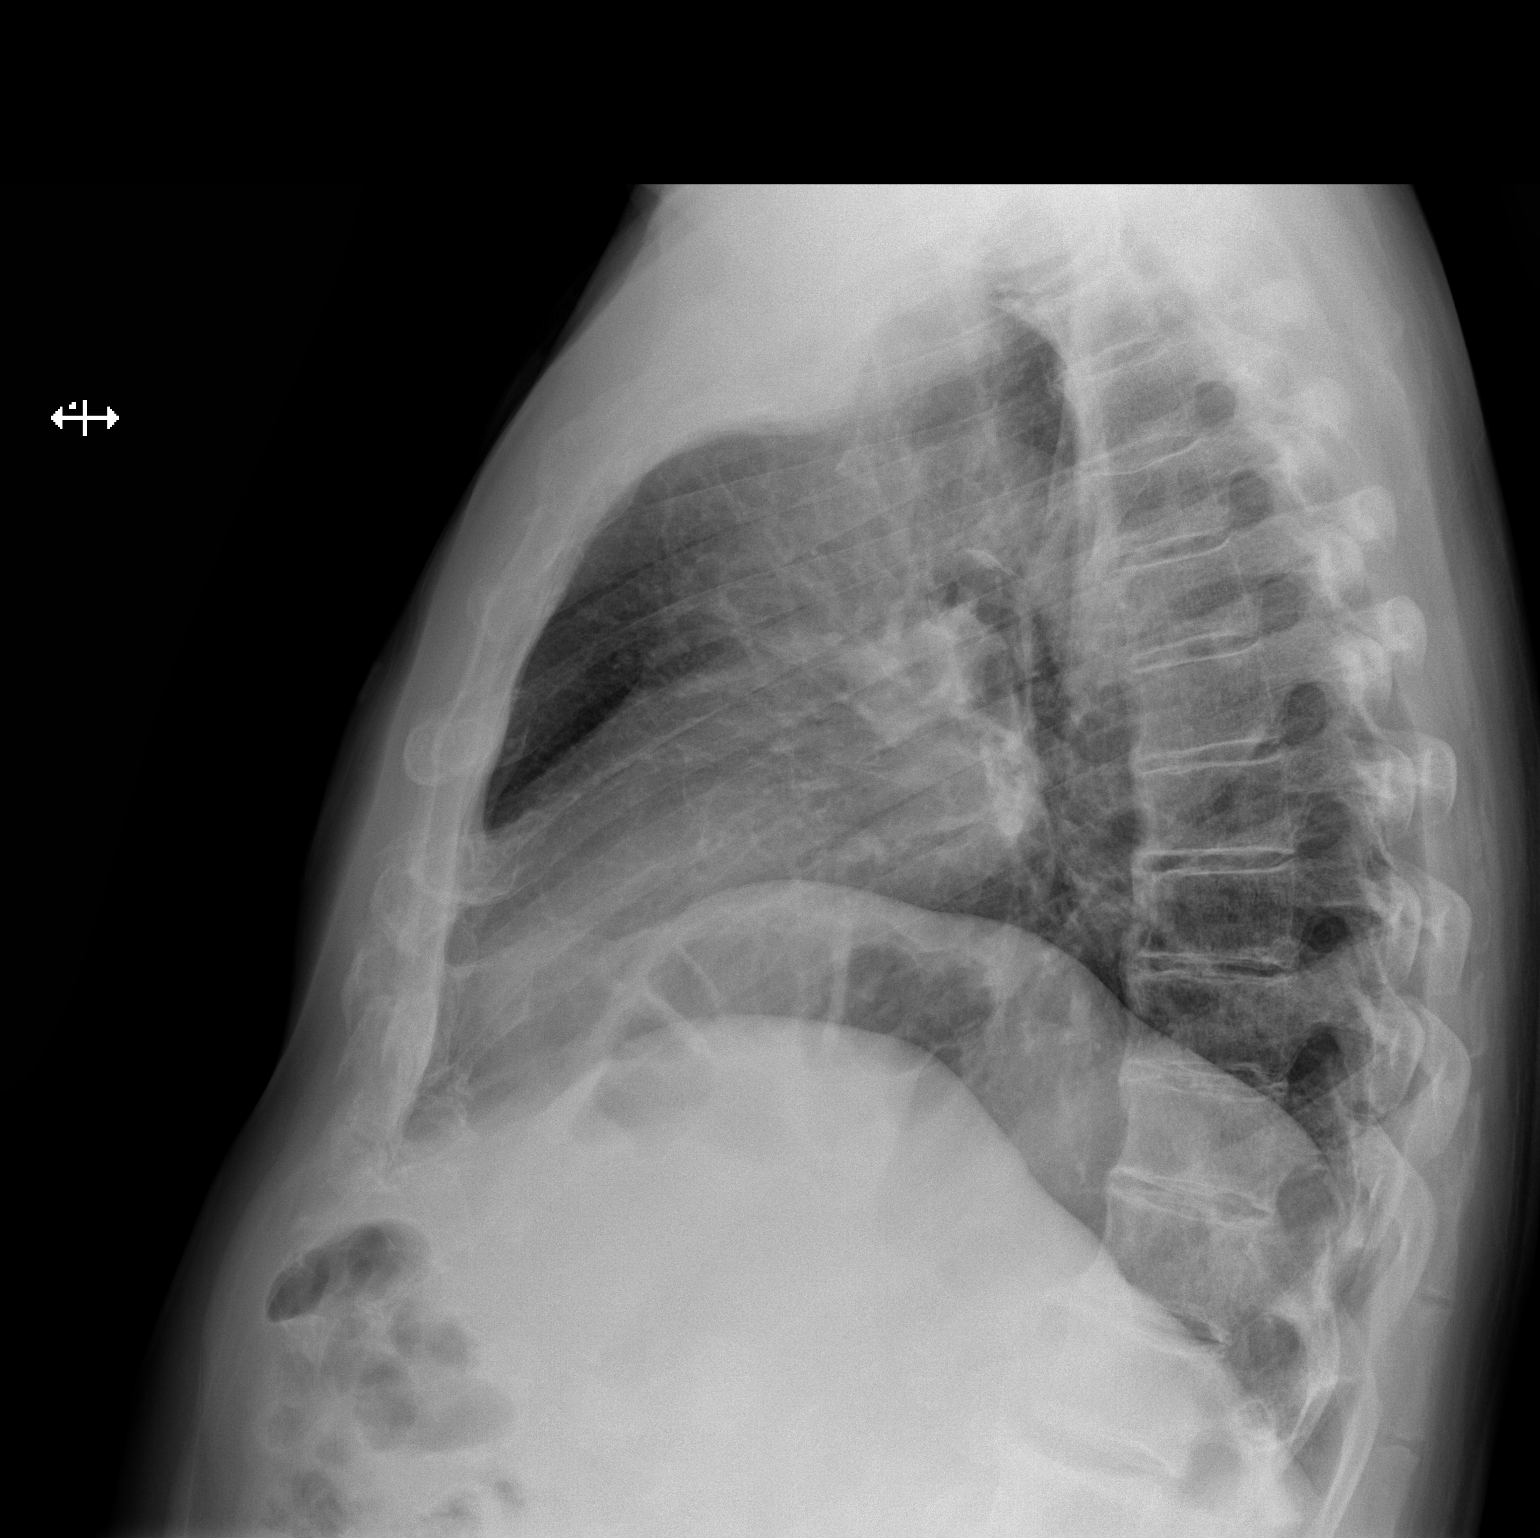

[2 of 2 positions shown; findings below may reference images not displayed]

FINDINGS: Cardiac and mediastinal silhouettes are within normal limits. Mild
tortuosity the intrathoracic aorta.

Lungs mildly hypoinflated with elevation of the left hemidiaphragm.
No focal infiltrates. No pulmonary edema or pleural effusion. No
pneumothorax.

No acute osseous abnormality. Advanced degenerative changes about
the right shoulder. Multilevel degenerative changes noted within the
visualized spine as well.
IMPRESSION: 1. Shallow lung inflation with mild elevation left hemidiaphragm.
2. No other active cardiopulmonary disease.

## 2019-07-06 IMAGING — DX DG CHEST 1V PORT
1 series · 1 of 1 positions shown · non-contrast
Comparison: 05/05/2017

CLINICAL DATA: Post CABG

EXAM:
PORTABLE CHEST 1 VIEW

[chest ap]
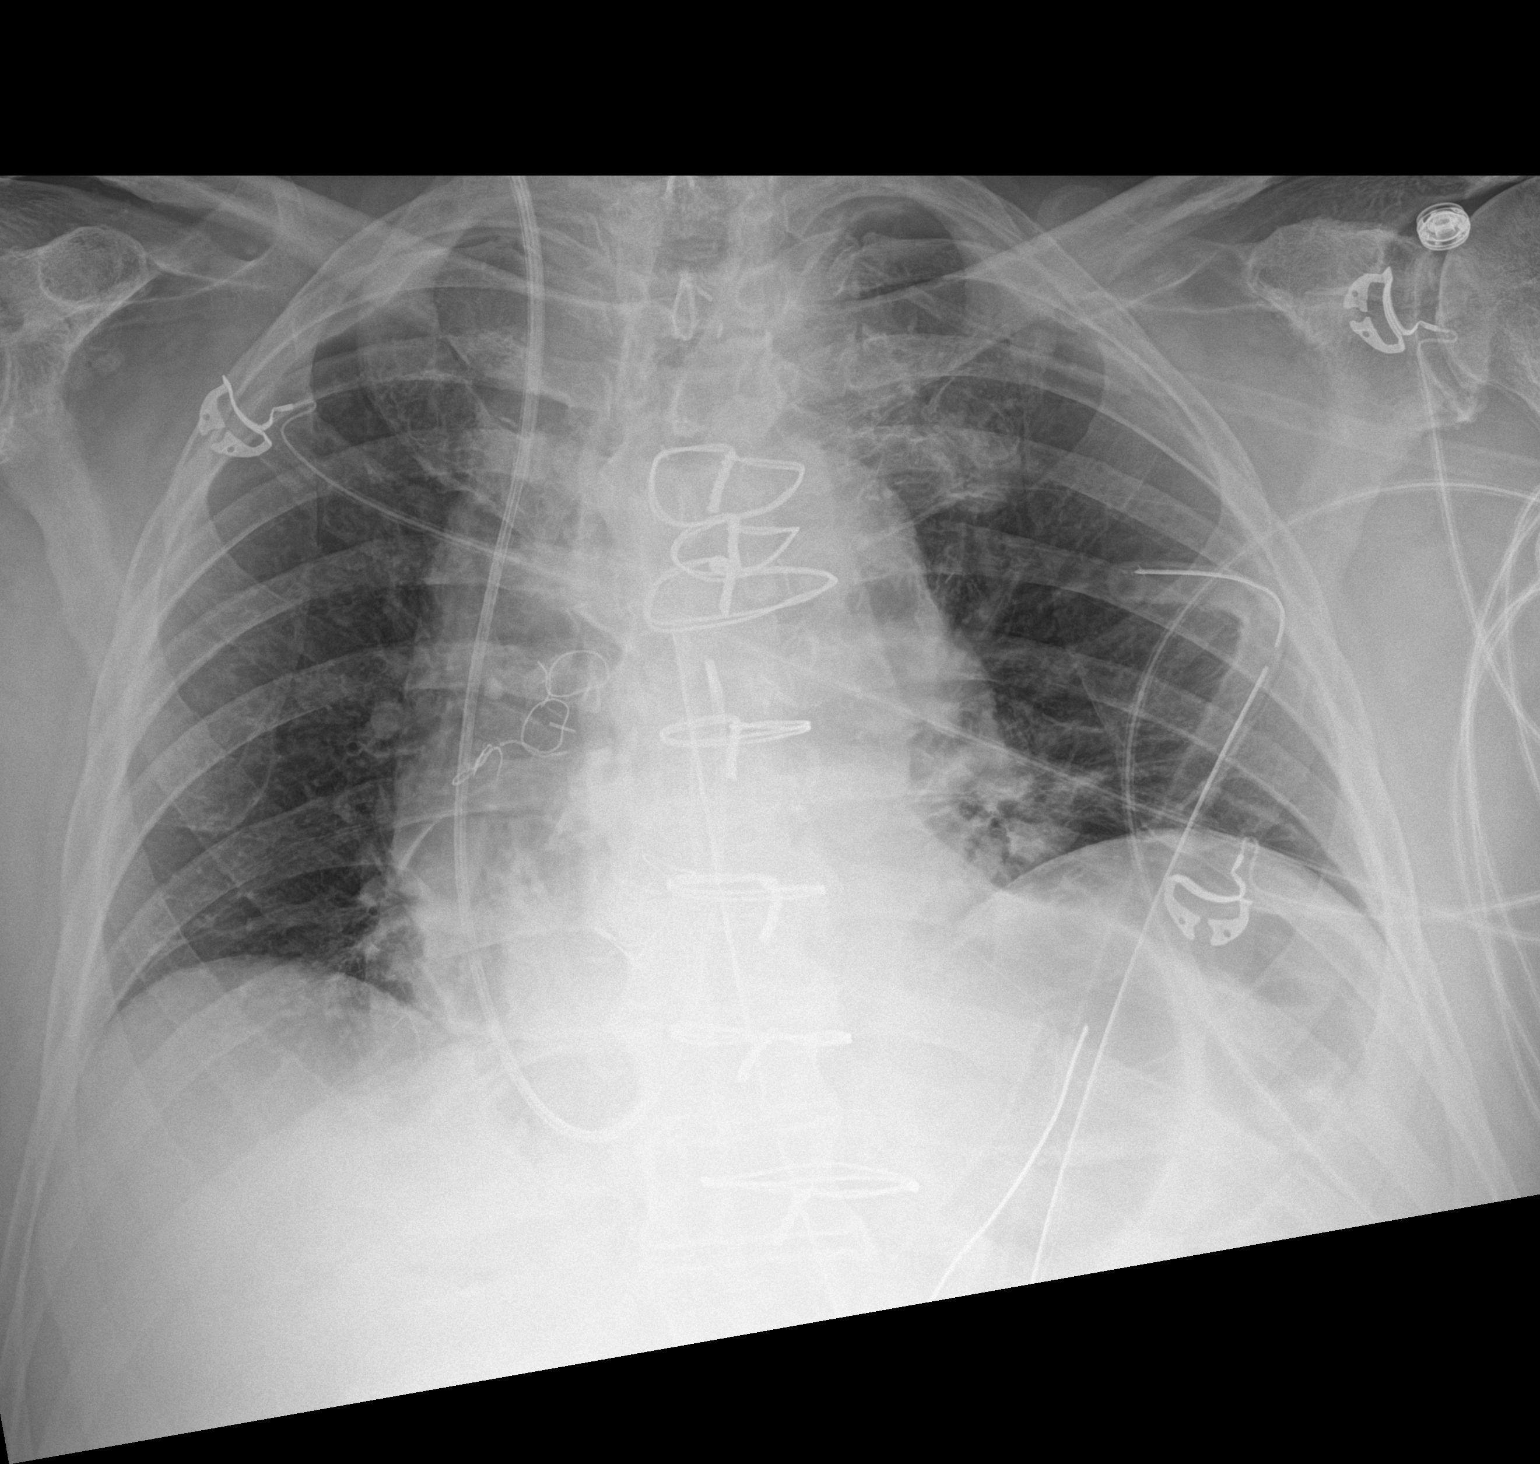

[1 of 1 positions shown; findings below may reference images not displayed]

FINDINGS: Interval removal of endotracheal tube and NG tube. Swan-Ganz
catheter and left chest tube are unchanged. No pneumothorax. Low
volumes with bibasilar atelectasis. Mild cardiomegaly. Prior CABG.
IMPRESSION: Postoperative changes. Interval extubation. Low volumes with
bibasilar atelectasis. No pneumothorax.

## 2019-07-11 DIAGNOSIS — Z952 Presence of prosthetic heart valve: Secondary | ICD-10-CM | POA: Diagnosis not present

## 2019-07-11 DIAGNOSIS — I10 Essential (primary) hypertension: Secondary | ICD-10-CM | POA: Diagnosis not present

## 2019-07-11 DIAGNOSIS — E785 Hyperlipidemia, unspecified: Secondary | ICD-10-CM | POA: Diagnosis not present

## 2019-07-11 DIAGNOSIS — Z951 Presence of aortocoronary bypass graft: Secondary | ICD-10-CM | POA: Diagnosis not present

## 2019-07-11 DIAGNOSIS — I350 Nonrheumatic aortic (valve) stenosis: Secondary | ICD-10-CM | POA: Diagnosis not present

## 2019-08-02 DIAGNOSIS — I350 Nonrheumatic aortic (valve) stenosis: Secondary | ICD-10-CM | POA: Diagnosis not present

## 2019-08-02 DIAGNOSIS — Z952 Presence of prosthetic heart valve: Secondary | ICD-10-CM | POA: Diagnosis not present

## 2019-08-02 DIAGNOSIS — Z951 Presence of aortocoronary bypass graft: Secondary | ICD-10-CM | POA: Diagnosis not present

## 2019-08-12 ENCOUNTER — Telehealth: Payer: Self-pay | Admitting: Urology

## 2019-08-12 NOTE — Telephone Encounter (Signed)
Please schedule a follow-up appointment for cystoscopy-next routine

## 2019-08-13 NOTE — Telephone Encounter (Signed)
Left message for patient to call back to schedule a cysto on office next available with [facility]. Anyone can schedule this for the patient.  Thanks,  Sharyn Lull

## 2019-08-27 ENCOUNTER — Encounter: Payer: Self-pay | Admitting: Urology

## 2019-08-27 ENCOUNTER — Ambulatory Visit: Payer: PPO | Admitting: Urology

## 2019-08-27 ENCOUNTER — Other Ambulatory Visit: Payer: Self-pay

## 2019-08-27 VITALS — BP 149/70 | HR 62 | Ht 71.0 in | Wt 209.8 lb

## 2019-08-27 DIAGNOSIS — Z8551 Personal history of malignant neoplasm of bladder: Secondary | ICD-10-CM | POA: Diagnosis not present

## 2019-08-27 DIAGNOSIS — C679 Malignant neoplasm of bladder, unspecified: Secondary | ICD-10-CM | POA: Diagnosis not present

## 2019-08-27 DIAGNOSIS — D494 Neoplasm of unspecified behavior of bladder: Secondary | ICD-10-CM | POA: Diagnosis not present

## 2019-08-27 MED ORDER — LEVOFLOXACIN 500 MG PO TABS
500.0000 mg | ORAL_TABLET | Freq: Once | ORAL | Status: AC
  Administered 2019-08-27: 500 mg via ORAL

## 2019-08-27 NOTE — Progress Notes (Addendum)
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 NED. A&Ox3.   No respiratory distress   Abd soft, NT, ND Normal phallus with bilateral descended testicles Pt decided against intravesical gemcitabine as discussed last visit  Cystoscopy Procedure Note  Patient identification was confirmed, informed consent was obtained, and patient was prepped using Betadine solution.  Lidocaine jelly was administered per urethral meatus.     Pre-Procedure: - Inspection reveals a normal caliber urethral meatus.  Procedure: The flexible cystoscope was introduced without difficulty - No urethral strictures/lesions are present. - moderate lateral lobe enlargement prostate - mild elevation bladder neck - Bilateral ureteral orifices identified - Bladder mucosa  reveals no ulcers, tumors, or lesions - No bladder stones - No trabeculation  Retroflexion shows no abnormalities   Post-Procedure: - Patient tolerated the procedure well  Assessment/ Plan:  No evidence of recurrent tumor  Follow up cystoscopy in 4 months   Abbie Sons, MD [facility] [address],  [place], [place] [address]   By signing my name below, I, [employer], attest that this documentation has been prepared under the direction and in the presence of John Giovanni, MD. Electronically Signed: Franchot Erichsen 08/27/19, 2:12 PM   I have reviewed the above documentation for accuracy and completeness, and I agree with the above.   Abbie Sons, MD

## 2019-08-28 LAB — URINALYSIS, COMPLETE
Bilirubin, UA: NEGATIVE
Glucose, UA: NEGATIVE
Ketones, UA: NEGATIVE
Leukocytes,UA: NEGATIVE
Nitrite, UA: NEGATIVE
Protein,UA: NEGATIVE
RBC, UA: NEGATIVE
Specific Gravity, UA: 1.02 (ref 1.005–1.030)
Urobilinogen, Ur: 0.2 mg/dL (ref 0.2–1.0)
pH, UA: 5.5 (ref 5.0–7.5)

## 2019-08-28 LAB — MICROSCOPIC EXAMINATION: Bacteria, UA: NONE SEEN

## 2019-08-29 ENCOUNTER — Encounter: Payer: Self-pay | Admitting: Urology

## 2019-08-31 ENCOUNTER — Encounter: Payer: Self-pay | Admitting: Nurse Practitioner

## 2019-08-31 ENCOUNTER — Ambulatory Visit (INDEPENDENT_AMBULATORY_CARE_PROVIDER_SITE_OTHER): Payer: PPO | Admitting: Nurse Practitioner

## 2019-08-31 ENCOUNTER — Other Ambulatory Visit: Payer: Self-pay

## 2019-08-31 VITALS — BP 128/66 | HR 82 | Temp 98.1°F | Wt 207.8 lb

## 2019-08-31 DIAGNOSIS — D692 Other nonthrombocytopenic purpura: Secondary | ICD-10-CM

## 2019-08-31 DIAGNOSIS — N529 Male erectile dysfunction, unspecified: Secondary | ICD-10-CM | POA: Diagnosis not present

## 2019-08-31 DIAGNOSIS — N1831 Chronic kidney disease, stage 3a: Secondary | ICD-10-CM | POA: Diagnosis not present

## 2019-08-31 DIAGNOSIS — E1169 Type 2 diabetes mellitus with other specified complication: Secondary | ICD-10-CM

## 2019-08-31 DIAGNOSIS — I130 Hypertensive heart and chronic kidney disease with heart failure and stage 1 through stage 4 chronic kidney disease, or unspecified chronic kidney disease: Secondary | ICD-10-CM | POA: Diagnosis not present

## 2019-08-31 DIAGNOSIS — I350 Nonrheumatic aortic (valve) stenosis: Secondary | ICD-10-CM | POA: Diagnosis not present

## 2019-08-31 DIAGNOSIS — I5022 Chronic systolic (congestive) heart failure: Secondary | ICD-10-CM

## 2019-08-31 DIAGNOSIS — C679 Malignant neoplasm of bladder, unspecified: Secondary | ICD-10-CM | POA: Diagnosis not present

## 2019-08-31 DIAGNOSIS — E785 Hyperlipidemia, unspecified: Secondary | ICD-10-CM | POA: Diagnosis not present

## 2019-08-31 DIAGNOSIS — E1121 Type 2 diabetes mellitus with diabetic nephropathy: Secondary | ICD-10-CM | POA: Diagnosis not present

## 2019-08-31 LAB — BAYER DCA HB A1C WAIVED: HB A1C (BAYER DCA - WAIVED): 6.8 % (ref <7.0)

## 2019-08-31 NOTE — Assessment & Plan Note (Signed)
Chronic, ongoing.  Continue collaboration with cardiology and current medication regimen.  BMP today.  Recommend: - Reminded to call for an overnight weight gain of >2 pounds or a weekly weight weight of >5 pounds - not adding salt to his food and has been reading food labels. Reviewed the importance of keeping daily sodium intake to <2000mg daily  

## 2019-08-31 NOTE — Assessment & Plan Note (Signed)
As evidenced by scattered bruises bilaterall upper extremities.  Gentle skin cleansers at home and lotion.  Monitor for skin breakdown, report to provider if present. 

## 2019-08-31 NOTE — Assessment & Plan Note (Signed)
Chronic, ongoing.  Continue current medication regimen and adjust as needed.  Lipid panel next visit, recent LDL <70. 

## 2019-08-31 NOTE — Progress Notes (Signed)
BP 128/66   Pulse 82   Temp 98.1 F (36.7 C) (Oral)   Wt 207 lb 12.8 oz (94.3 kg)   SpO2 96%   BMI 28.98 kg/m    Subjective:    Patient ID: John [name], male    DOB: Jan 08, 1941, 79 y.o.   MRN: [id number]  HPI: John [name] is a 79 y.o. male  Chief Complaint  Patient presents with  . Diabetes  . Hyperlipidemia  . Hypertension   DIABETES Continues on Janumet XR 50-1000. Last A1C was 7.1% in March. Hypoglycemic episodes:no Polydipsia/polyuria:no Visual disturbance:no Chest pain:no Paresthesias:no Glucose Monitoring:no Accucheck frequency: Not Checking Fasting glucose: Post prandial: Evening: Before meals: Taking Insulin?:no Long acting insulin: Short acting insulin: Blood Pressure Monitoring:rarely Retinal Examination:Not Up To Date Foot Exam:Up to Date Pneumovax:Up to Date Influenza:Up to Date Aspirin:yes  HYPERTENSION / HYPERLIPIDEMIA/HF Continues on Lasix and Metoprolol + Losartan + Lipitor, ASA. Last saw John [name] 07/11/19 with no medications changes -- is to return in 3 months. Has moderate underlying aortic stenosis noted on past imaging, which is monitored by cardiology.  Last echo 08/02/19 -- mild LV dysfunction with LVH, EF 45%.  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  He would like medication for ED, discussed with him to see if this was okay with cardiology at visit in August and if they approve then can order short burst for him, his anniversary is in September. Satisfied with current treatment?yes Duration of hypertension:chronic BP monitoring frequency:not checking BP range: BP medication side effects:no Duration of hyperlipidemia:chronic Cholesterol medication side effects:no Cholesterol supplements: none Medication compliance:good compliance Aspirin:yes Recent stressors:no Recurrent headaches:no Visual  changes:no Palpitations:no Dyspnea:no Chest pain:no Lower extremity edema:yes, at baseline Dizzy/lightheaded:no  BLADDER CANCER: Monitor by urology with last visit with John [name] on 08/27/19 and had recent cystoscopy on 08/27/19.  He does endorse that each time he has these procedures it takes longer to recover, so he is pondering whether to continue doing these as he reports belly pain lasts for 6 weeks afterwards.  John [name] discussed the treatment that they want to do, 6-week course of intravesical gemcitabine, and he is unsure if he wants to go through this.    Relevant past medical, surgical, family and social history reviewed and updated as indicated. Interim medical history since our last visit reviewed. Allergies and medications reviewed and updated.  Review of Systems  Constitutional: Negative for activity change, diaphoresis, fatigue and fever.  Respiratory: Negative for cough, chest tightness, shortness of breath and wheezing.   Cardiovascular: Negative for chest pain, palpitations and leg swelling.  Gastrointestinal: Negative.   Endocrine: Negative for cold intolerance, heat intolerance, polydipsia, polyphagia and polyuria.  Neurological: Negative.   Psychiatric/Behavioral: Negative.     Per HPI unless specifically indicated above     Objective:    BP 128/66   Pulse 82   Temp 98.1 F (36.7 C) (Oral)   Wt 207 lb 12.8 oz (94.3 kg)   SpO2 96%   BMI 28.98 kg/m   Wt Readings from Last 3 Encounters:  08/31/19 207 lb 12.8 oz (94.3 kg)  08/27/19 209 lb 12.8 oz (95.2 kg)  06/01/19 211 lb (95.7 kg)    Physical Exam Vitals and nursing note reviewed.  Constitutional:      General: He is awake. He is not in acute distress.    Appearance: He is well-developed and overweight. He is not ill-appearing.  HENT:     Head: Normocephalic and atraumatic.  Right Ear: Hearing normal. No drainage.     Left Ear: Hearing normal. No drainage.  Eyes:     General: Lids are  normal.        Right eye: No discharge.        Left eye: No discharge.     Conjunctiva/sclera: Conjunctivae normal.     Pupils: Pupils are equal, round, and reactive to light.  Neck:     Thyroid: No thyromegaly.     Vascular: No carotid bruit.  Cardiovascular:     Rate and Rhythm: Normal rate and regular rhythm.     Heart sounds: S1 normal and S2 normal. Murmur heard.  Systolic murmur is present with a grade of 2/6.  No gallop.   Pulmonary:     Effort: Pulmonary effort is normal. No accessory muscle usage or respiratory distress.     Breath sounds: Normal breath sounds.  Abdominal:     General: Bowel sounds are normal.     Palpations: Abdomen is soft.  Musculoskeletal:        General: Normal range of motion.     Cervical back: Normal range of motion and neck supple.     Right lower leg: Edema (1+) present.     Left lower leg: Edema (1+) present.  Skin:    General: Skin is warm and dry.     Capillary Refill: Capillary refill takes less than 2 seconds.     Comments: Scattered pale purple bruises noted to bilateral upper extremities, skin intact.  Neurological:     Mental Status: He is alert and oriented to person, place, and time.     Deep Tendon Reflexes: Reflexes are normal and symmetric.  Psychiatric:        Attention and Perception: Attention normal.        Mood and Affect: Mood normal.        Speech: Speech normal.        Behavior: Behavior normal. Behavior is cooperative.        Thought Content: Thought content normal.    Results for orders placed or performed in visit on 08/27/19  Microscopic Examination   Urine  Result Value Ref Range   WBC, UA 0-5 0 - 5 /hpf   RBC 0-2 0 - 2 /hpf   Epithelial Cells (non renal) 0-10 0 - 10 /hpf   Bacteria, UA None seen None seen/Few  Urinalysis, Complete  Result Value Ref Range   Specific Gravity, UA 1.020 1.005 - 1.030   pH, UA 5.5 5.0 - 7.5   Color, UA Yellow Yellow   Appearance Ur Clear Clear   Leukocytes,UA Negative  Negative   Protein,UA Negative Negative/Trace   Glucose, UA Negative Negative   Ketones, UA Negative Negative   RBC, UA Negative Negative   Bilirubin, UA Negative Negative   Urobilinogen, Ur 0.2 0.2 - 1.0 mg/dL   Nitrite, UA Negative Negative   Microscopic Examination See below:       Assessment & Plan:   Problem List Items Addressed This Visit      Cardiovascular and Mediastinum   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  Initial BP elevated, but repeat manually at goal and home readings in past at goal for age.  Avoid hypotension. Continue current medication regimen and adjust as needed.  BMP today.  Urine ALB 150 and A:C >300 last visit.  Losartan for kidney protection.  Refer to nephrology if worsening kidney function.  Recommend he monitor his BP regularly at home and follow DASH diet.  Continue to collaborate with cardiology.      Aortic valve stenosis, moderate    Followed by cardiology.  Last echo June 2021.  Continue to collaborate with cardiology.      Senile purpura ([place])    As evidenced by scattered bruises bilaterall upper extremities.  Gentle skin cleansers at home and lotion.  Monitor for skin breakdown, report to provider if present.      Chronic systolic heart failure (HCC)    Chronic, ongoing.  Continue collaboration with cardiology and current medication regimen.  BMP today.  Recommend: - Reminded to call for an overnight weight gain of >2 pounds or a weekly weight weight of >5 pounds - not adding salt to his food and has been reading food labels. Reviewed the importance of keeping daily sodium intake to 2000mg  daily         Endocrine   Hyperlipidemia associated with type 2 diabetes mellitus (HCC)    Chronic, ongoing.  Continue current medication regimen and adjust as needed.  Lipid panel next visit, recent LDL <70.      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   Urine ALB 150 and A:C >300 last visit.  Losartan for kidney protection.  BMP today.  Continue current medication regimen and adjust as needed.  Return in 3 months.  Avoid hypoglycemia due to advanced age.  Will refer him to nephrology if worsening kidney function presents.      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      CKD (chronic kidney disease) stage 3, GFR 30-59 ml/min    Chronic, stable with recent labs showing improved CRT and GFR last visit.  Continue Losartan for kidney protection.  BMP today.  Refer to nephrology as needed.        Other   ED (erectile dysfunction)    He is interested in medication for this, he will discuss with cardiology prior to PCP ordering medication for this.          Follow up plan: Return in about 3 months (around 12/01/2019) for T2DM, HTN/HLD.

## 2019-08-31 NOTE — Assessment & Plan Note (Signed)
Chronic, ongoing.  Initial BP elevated, but repeat manually at goal and home readings in past at goal for age.  Avoid hypotension. Continue current medication regimen and adjust as needed.  BMP today.  Urine ALB 150 and A:C >300 last visit.  Losartan for kidney protection.  Refer to nephrology if worsening kidney function.  Recommend he monitor his BP regularly at home and follow DASH diet.  Continue to collaborate with cardiology.

## 2019-08-31 NOTE — Assessment & Plan Note (Signed)
Continue to collaborate with urology, at length discussion on goals of care will continue at visits. 

## 2019-08-31 NOTE — Assessment & Plan Note (Signed)
Followed by cardiology.  Last echo June 2021.  Continue to collaborate with cardiology. 

## 2019-08-31 NOTE — Assessment & Plan Note (Signed)
Chronic, stable with recent labs showing improved CRT and GFR last visit.  Continue Losartan for kidney protection.  BMP today.  Refer to nephrology as needed. 

## 2019-08-31 NOTE — Patient Instructions (Signed)

## 2019-08-31 NOTE — Assessment & Plan Note (Signed)
He is interested in medication for this, he will discuss with cardiology prior to PCP ordering medication for this.

## 2019-08-31 NOTE — Assessment & Plan Note (Signed)
Chronic, ongoing with A1C 6.8% today.   Urine ALB 150 and A:C >300 last visit.  Losartan for kidney protection.  BMP today.  Continue current medication regimen and adjust as needed.  Return in 3 months.  Avoid hypoglycemia due to advanced age.  Will refer him to nephrology if worsening kidney function presents.

## 2019-09-01 LAB — BASIC METABOLIC PANEL
BUN/Creatinine Ratio: 21 (ref 10–24)
BUN: 23 mg/dL (ref 8–27)
CO2: 29 mmol/L (ref 20–29)
Calcium: 9.4 mg/dL (ref 8.6–10.2)
Chloride: 100 mmol/L (ref 96–106)
Creatinine, Ser: 1.11 mg/dL (ref 0.76–1.27)
GFR calc Af Amer: 72 mL/min/{1.73_m2} (ref >59)
GFR calc non Af Amer: 62 mL/min/{1.73_m2} (ref >59)
Glucose: 107 mg/dL — ABNORMAL HIGH (ref 65–99)
Potassium: 4.2 mmol/L (ref 3.5–5.2)
Sodium: 140 mmol/L (ref 134–144)

## 2019-09-01 NOTE — Progress Notes (Signed)
Good morning, please let John [name] know his kidney function and electrolytes are stable.  No medication changes needed at this time.  Have a great day!!

## 2019-09-25 ENCOUNTER — Other Ambulatory Visit: Payer: Self-pay

## 2019-10-15 DIAGNOSIS — E119 Type 2 diabetes mellitus without complications: Secondary | ICD-10-CM | POA: Diagnosis not present

## 2019-10-15 DIAGNOSIS — I10 Essential (primary) hypertension: Secondary | ICD-10-CM | POA: Diagnosis not present

## 2019-10-15 DIAGNOSIS — Z951 Presence of aortocoronary bypass graft: Secondary | ICD-10-CM | POA: Diagnosis not present

## 2019-10-15 DIAGNOSIS — E785 Hyperlipidemia, unspecified: Secondary | ICD-10-CM | POA: Diagnosis not present

## 2019-10-15 DIAGNOSIS — Z952 Presence of prosthetic heart valve: Secondary | ICD-10-CM | POA: Diagnosis not present

## 2019-10-15 DIAGNOSIS — I350 Nonrheumatic aortic (valve) stenosis: Secondary | ICD-10-CM | POA: Diagnosis not present

## 2019-11-26 ENCOUNTER — Ambulatory Visit (INDEPENDENT_AMBULATORY_CARE_PROVIDER_SITE_OTHER): Payer: PPO

## 2019-11-26 VITALS — Ht 72.0 in | Wt 208.0 lb

## 2019-11-26 DIAGNOSIS — Z0000 Encounter for general adult medical examination without abnormal findings: Secondary | ICD-10-CM | POA: Diagnosis not present

## 2019-11-26 NOTE — Progress Notes (Signed)
I connected with John [name] today by telephone and verified that I am speaking with the correct person using two identifiers. Location patient: home Location provider: work Persons participating in the virtual visit: Yedidya Duddy, Glenna Durand LPN.   I discussed the limitations, risks, security and privacy concerns of performing an evaluation and management service by telephone and the availability of in person appointments. I also discussed with the patient that there may be a patient responsible charge related to this service. The patient expressed understanding and verbally consented to this telephonic visit.    Interactive audio and video telecommunications were attempted between this provider and patient, however failed, due to patient having technical difficulties OR patient did not have access to video capability.  We continued and completed visit with audio only.     Vital signs may be patient reported or missing.  Subjective:   John [name] is a 79 y.o. male who presents for Medicare Annual/Subsequent preventive examination.  Review of Systems     Cardiac Risk Factors include: advanced age (>65men, >29 women);diabetes mellitus;dyslipidemia;hypertension;male gender;sedentary lifestyle     Objective:    Today's Vitals   11/26/19 1026  Weight: 208 lb (94.3 kg)  Height: 6' (1.829 m)   Body mass index is 28.21 kg/m.  Advanced Directives 11/26/2019 05/15/2019 05/03/2019 11/15/2018 11/15/2018 03/14/2018 03/02/2018  Does Patient Have a Medical Advance Directive? Yes Yes Yes Yes No No No  Type of Advance Directive Out of facility DNR (pink MOST or yellow form) [place];Living will [place];Living will Living will;Healthcare Power of Attorney - - -  Does patient want to make changes to medical advance directive? - No - Patient declined No - Patient declined - - - -  Copy of [place] in Chart? - No - copy requested No -  copy requested No - copy requested - - -  Would patient like information on creating a medical advance directive? - - No - Patient declined - - No - Patient declined No - Patient declined    Current Medications (verified) Outpatient Encounter Medications as of 11/26/2019  Medication Sig  . Ascorbic Acid (VITAMIN C) 1000 MG tablet Take 1,000 mg by mouth daily.  Marland Kitchen aspirin 81 MG EC tablet Take 1 tablet (81 mg total) by mouth daily.  Marland Kitchen atorvastatin (LIPITOR) 40 MG tablet Take 1 tablet (40 mg total) by mouth daily at 6 PM.  . Cholecalciferol (VITAMIN D3) 50 MCG (2000 UT) TABS Take 2,000 Units by mouth daily.   . Cyanocobalamin (RA VITAMIN B-12 TR) 1000 MCG TBCR Take 1,000 mcg by mouth daily.   . furosemide (LASIX) 40 MG tablet Take 1/2 (one-half) tablet by mouth once daily (Patient taking differently: Take 20 mg by mouth daily. )  . HYDROcodone-acetaminophen (NORCO/VICODIN) 5-325 MG tablet Take 1 tablet by mouth every 6 (six) hours as needed for moderate pain.  . Iron, Ferrous Sulfate, 142 (45 Fe) MG TBCR Take 45 mg by mouth daily.  Marland Kitchen KLOR-CON M10 10 MEQ tablet Take 1 tablet (10 mEq total) by mouth daily.  Marland Kitchen losartan (COZAAR) 25 MG tablet Take 0.5 tablets (12.5 mg total) by mouth daily.  . metoprolol tartrate (LOPRESSOR) 25 MG tablet Take 1 tablet (25 mg total) by mouth 2 (two) times daily.  . naproxen sodium (ALEVE) 220 MG tablet Take 220 mg by mouth daily as needed (pain).   . SitaGLIPtin-MetFORMIN HCl (JANUMET XR) 50-1000 MG TB24 Take 1 tablet by mouth 2 (two)  times daily.   No facility-administered encounter medications on file as of 11/26/2019.    Allergies (verified) Patient has no known allergies.   History: Past Medical History:  Diagnosis Date  . Anemia   . Anginal pain ([place])   . Aortic stenosis   . Cancer ([place])    bladder  . Cataracts, bilateral   . CHF (congestive heart failure) ([place])   . Chronic kidney disease   . Coronary artery disease   . Diabetes mellitus without  complication ([place])    takes Janumet daily...dx approx. 2009  . Dyspnea   . Dysrhythmia   . Heart murmur   . History of colon polyps    benign  . Hyperlipidemia    takes Lovastatin daily  . Hypertension    takes Hyzaar and Amlodipine daily  . Joint pain   . Medical history non-contributory   . Muscle cramps    occasionally in legs   . Osteoarthritis   . Pre-diabetes   . Senile purpura ([place]) 03/13/2018  . Skin cancer of face   . Urinary urgency    Past Surgical History:  Procedure Laterality Date  . AORTIC VALVE REPLACEMENT N/A 05/05/2017   Procedure: AORTIC VALVE REPLACEMENT (AVR) USING MAGNA EASE PERICARDIAL BIOPROSTHESIS - AORTIC VALVE MODEL 3300TFX, SIZE 23 MM;  Surgeon: Gaye Pollack, MD;  Location: [place];  Service: Open Heart Surgery;  Laterality: N/A;  . BLADDER SURGERY    . CARDIAC VALVE REPLACEMENT    . COLONOSCOPY    . CORONARY ARTERY BYPASS GRAFT N/A 05/05/2017   Procedure: CORONARY ARTERY BYPASS GRAFTING (CABG) x 4 WITH ENDOSCOPIC HARVESTING OF RIGHT SAPHENOUS VEIN;  Surgeon: Gaye Pollack, MD;  Location: [place];  Service: Open Heart Surgery;  Laterality: N/A;  . CYSTOURETHROSCOPY    . JOINT REPLACEMENT Left    hip  . JOINT REPLACEMENT Right    knee  . KNEE ARTHROPLASTY Right 01/12/2016   Procedure: RIGHT  TOTAL KNEE ARTHROPLASTY WITH COMPUTER NAVIGATION;  Surgeon: Rod Can, MD;  Location: [place];  Service: Orthopedics;  Laterality: Right;  Needs RNFA  . NO PAST SURGERIES    . RIGHT/LEFT HEART CATH AND CORONARY ANGIOGRAPHY N/A 04/21/2017   Procedure: RIGHT/LEFT HEART CATH AND CORONARY ANGIOGRAPHY;  Surgeon: Isaias Cowman, MD;  Location: [place];  Service: Cardiovascular;  Laterality: N/A;  . SKIN SURGERY Left    arm  . TEE WITHOUT CARDIOVERSION N/A 05/05/2017   Procedure: TRANSESOPHAGEAL ECHOCARDIOGRAM (TEE);  Surgeon: Gaye Pollack, MD;  Location: [place];  Service: Open Heart Surgery;  Laterality: N/A;  . TRANSURETHRAL RESECTION OF BLADDER  TUMOR N/A 05/15/2019   Procedure: TRANSURETHRAL RESECTION OF BLADDER TUMOR (TURBT);  Surgeon: Abbie Sons, MD;  Location: [facility];  Service: Urology;  Laterality: N/A;  . TRANSURETHRAL RESECTION OF BLADDER TUMOR WITH MITOMYCIN-C N/A 03/14/2018   Procedure: TRANSURETHRAL RESECTION OF BLADDER TUMOR WITH Gemcitabine;  Surgeon: Abbie Sons, MD;  Location: [facility];  Service: Urology;  Laterality: N/A;   Family History  Problem Relation Age of Onset  . Dementia Mother   . Stroke Mother   . Stroke Father   . Diabetes Father   . Heart disease Father   . Cancer Sister   . Hypertension Brother   . Cancer Sister    Social History   Socioeconomic History  . Marital status: Married    Spouse name: Not on file  . Number of children: Not on file  . Years of education:  Not on file  . Highest education level: 10th grade  Occupational History  . Occupation: retired  Tobacco Use  . Smoking status: Former Smoker    Years: 25.00    Quit date: 12/15/1980    Years since quitting: 38.9  . Smokeless tobacco: Never Used  Vaping Use  . Vaping Use: Never used  Substance and Sexual Activity  . Alcohol use: No    Alcohol/week: 0.0 standard drinks  . Drug use: No  . Sexual activity: Yes  Other Topics Concern  . Not on file  Social History Narrative  . Not on file   Social Determinants of Health   Financial Resource Strain: Low Risk   . Difficulty of Paying Living Expenses: Not hard at all  Food Insecurity: No Food Insecurity  . Worried About Charity fundraiser in the Last Year: Never true  . Ran Out of Food in the Last Year: Never true  Transportation Needs: No Transportation Needs  . Lack of Transportation (Medical): No  . Lack of Transportation (Non-Medical): No  Physical Activity: Inactive  . Days of Exercise per Week: 0 days  . Minutes of Exercise per Session: 0 min  Stress: No Stress Concern Present  . Feeling of Stress : Not at all  Social Connections:   . Frequency of  Communication with Friends and Family: Not on file  . Frequency of Social Gatherings with Friends and Family: Not on file  . Attends Religious Services: Not on file  . Active Member of Clubs or Organizations: Not on file  . Attends Archivist Meetings: Not on file  . Marital Status: Not on file    Tobacco Counseling Counseling given: Not Answered   Clinical Intake:  Pre-visit preparation completed: Yes  Pain : No/denies pain     Nutritional Status: BMI 25 -29 Overweight Nutritional Risks: None Diabetes: Yes  How often do you need to have someone help you when you read instructions, pamphlets, or other written materials from your doctor or pharmacy?: 1 - Never What is the last grade level you completed in school?: 10th grade  Diabetic? Yes Nutrition Risk Assessment:  Has the patient had any N/V/D within the last 2 months?  No  Does the patient have any non-healing wounds?  No  Has the patient had any unintentional weight loss or weight gain?  No   Diabetes:  Is the patient diabetic?  Yes  If diabetic, was a CBG obtained today?  No  Did the patient bring in their glucometer from home?  No  How often do you monitor your CBG's? none.   Financial Strains and Diabetes Management:  Are you having any financial strains with the device, your supplies or your medication? No .  Does the patient want to be seen by Chronic Care Management for management of their diabetes?  No  Would the patient like to be referred to a Nutritionist or for Diabetic Management?  No   Diabetic Exams:  Diabetic Eye Exam: Completed 06/12/2019 Diabetic Foot Exam: Overdue, Pt has been advised about the importance in completing this exam. Pt is scheduled for diabetic foot exam on next appointment.   Interpreter Needed?: No  Information entered by :: NAllen LPN   Activities of Daily Living In your present state of health, do you have any difficulty performing the following activities:  11/26/2019 05/03/2019  Hearing? Y N  Vision? Y N  Comment sometimes double vision -  Difficulty concentrating or making decisions? N N  Walking or climbing stairs? N N  Dressing or bathing? N N  Doing errands, shopping? N N  Preparing Food and eating ? N -  Using the Toilet? N -  In the past six months, have you accidently leaked urine? N -  Do you have problems with loss of bowel control? N -  Managing your Medications? N -  Managing your Finances? N -  Housekeeping or managing your Housekeeping? N -  Some recent data might be hidden    Patient Care Team: Venita Lick, NP as PCP - General (Nurse Practitioner) Rod Can, MD as Consulting Physician (Orthopedic Surgery) Isaias Cowman, MD as Consulting Physician (Cardiology) Gaye Pollack, MD as Consulting Physician (Cardiothoracic Surgery) Jannet Mantis, MD (Dermatology) Minor, Dalbert Garnet, RN (Inactive) as [place] any recent Medical Services you may have received from other than [facility] providers in the past year (date may be approximate).     Assessment:   This is a routine wellness examination for John [name].  Hearing/Vision screen  Hearing Screening   125Hz  250Hz  500Hz  1000Hz  2000Hz  3000Hz  4000Hz  6000Hz  8000Hz   Right ear:           Left ear:           Vision Screening Comments: Regular eye exams, Dr. Edison Pace, [facility]  Dietary issues and exercise activities discussed: Current Exercise Habits: The patient does not participate in regular exercise at present  Goals    . DIET - INCREASE WATER INTAKE     Recommend drinking at least 6-8 glasses of water a day     . Patient Stated     11/26/2019, no goals      Depression Screen PHQ 2/9 Scores 11/26/2019 02/28/2019 11/15/2018 11/15/2018 11/03/2017 06/13/2017 11/03/2016  PHQ - 2 Score 0 0 0 0 0 2 0  PHQ- 9 Score - - - - - 8 -    Fall Risk Fall Risk  11/26/2019 11/15/2018 11/03/2017 06/13/2017 11/03/2016  Falls in the  past year? 0 0 No No No  Risk for fall due to : Impaired balance/gait;Medication side effect - - - -  Follow up Falls evaluation completed;Education provided;Falls prevention discussed - - - -    Any stairs in or around the home? No  If so, are there any without handrails? n/a Home free of loose throw rugs in walkways, pet beds, electrical cords, etc? Yes  Adequate lighting in your home to reduce risk of falls? Yes   ASSISTIVE DEVICES UTILIZED TO PREVENT FALLS:  Life alert? No  Use of a cane, walker or w/c? No  Grab bars in the bathroom? No  Shower chair or bench in shower? No  Elevated toilet seat or a handicapped toilet? No   TIMED UP AND GO:  Was the test performed? No .    Cognitive Function:     6CIT Screen 11/26/2019 02/28/2019 11/03/2017 11/03/2016  What Year? 0 points 0 points 0 points 0 points  What month? 0 points 0 points 0 points 0 points  What time? 0 points 0 points 0 points 0 points  Count back from 20 0 points 0 points 0 points 0 points  Months in reverse 0 points 0 points 0 points 0 points  Repeat phrase 0 points 0 points 0 points 2 points  Total Score 0 0 0 2    Immunizations Immunization History  Administered Date(s) Administered  . Fluad Quad(high Dose 65+) 12/13/2018  . Influenza, High Dose  Seasonal PF 11/25/2015, 11/03/2016, 11/03/2017  . Influenza,inj,Quad PF,6+ Mos 11/18/2014  . PFIZER SARS-COV-2 Vaccination 03/12/2019, 04/02/2019  . Pneumococcal Conjugate-13 11/23/2013  . Pneumococcal Polysaccharide-23 05/24/2014  . Zoster 05/19/1957    TDAP status: Due, Education has been provided regarding the importance of this vaccine. Advised may receive this vaccine at local pharmacy or Health Dept. Aware to provide a copy of the vaccination record if obtained from local pharmacy or Health Dept. Verbalized acceptance and understanding. Flu Vaccine status: Up to date Pneumococcal vaccine status: Up to date Covid-19 vaccine status: Completed  vaccines  Qualifies for Shingles Vaccine? Yes   Zostavax completed Yes   Shingrix Completed?: No.    Education has been provided regarding the importance of this vaccine. Patient has been advised to call insurance company to determine out of pocket expense if they have not yet received this vaccine. Advised may also receive vaccine at local pharmacy or Health Dept. Verbalized acceptance and understanding.  Screening Tests Health Maintenance  Topic Date Due  . FOOT EXAM  05/12/2019  . INFLUENZA VACCINE  09/30/2019  . TETANUS/TDAP  08/30/2020 (Originally 03/25/1959)  . HEMOGLOBIN A1C  03/02/2020  . OPHTHALMOLOGY EXAM  06/11/2020  . COVID-19 Vaccine  Completed  . PNA vac Low Risk Adult  Completed  . COLONOSCOPY  Discontinued    Health Maintenance  Health Maintenance Due  Topic Date Due  . FOOT EXAM  05/12/2019  . INFLUENZA VACCINE  09/30/2019    Colorectal cancer screening: No longer required.   Lung Cancer Screening: (Low Dose CT Chest recommended if Age 50-80 years, 30 pack-year currently smoking OR have quit w/in 15years.) does not qualify.   Lung Cancer Screening Referral: no  Additional Screening:  Hepatitis C Screening: does not qualify;  Vision Screening: Recommended annual ophthalmology exams for early detection of glaucoma and other disorders of the eye. Is the patient up to date with their annual eye exam?  Yes  Who is the provider or what is the name of the office in which the patient attends annual eye exams? Dr. Edison Pace If pt is not established with a provider, would they like to be referred to a provider to establish care? No .   Dental Screening: Recommended annual dental exams for proper oral hygiene  Community Resource Referral / Chronic Care Management: CRR required this visit?  No   CCM required this visit?  No      Plan:     I have personally reviewed and noted the following in the patient's chart:   . Medical and social history . Use of alcohol,  tobacco or illicit drugs  . Current medications and supplements . Functional ability and status . Nutritional status . Physical activity . Advanced directives . List of other physicians . Hospitalizations, surgeries, and ER visits in previous 12 months . Vitals . Screenings to include cognitive, depression, and falls . Referrals and appointments  In addition, I have reviewed and discussed with patient certain preventive protocols, quality metrics, and best practice recommendations. A written personalized care plan for preventive services as well as general preventive health recommendations were provided to patient.     Kellie Simmering, LPN   11/14/9448   Nurse Notes:

## 2019-11-26 NOTE — Patient Instructions (Signed)
John [name] , Thank you for taking time to come for your Medicare Wellness Visit. I appreciate your ongoing commitment to your health goals. Please review the following plan we discussed and let me know if I can assist you in the future.   Screening recommendations/referrals: Colonoscopy: not required Recommended yearly ophthalmology/optometry visit for glaucoma screening and checkup Recommended yearly dental visit for hygiene and checkup  Vaccinations: Influenza vaccine: due Pneumococcal vaccine: completed 05/24/2014 Tdap vaccine: due Shingles vaccine: discussed   Covid-19:  04/02/2019, 03/12/2019  Advanced directives: copy in chart  Conditions/risks identified: none  Next appointment: Follow up in one year for your annual wellness visit.   Preventive Care 81 Years and Older, Male Preventive care refers to lifestyle choices and visits with your health care provider that can promote health and wellness. What does preventive care include?  A yearly physical exam. This is also called an annual well check.  Dental exams once or twice a year.  Routine eye exams. Ask your health care provider how often you should have your eyes checked.  Personal lifestyle choices, including:  Daily care of your teeth and gums.  Regular physical activity.  Eating a healthy diet.  Avoiding tobacco and drug use.  Limiting alcohol use.  Practicing safe sex.  Taking low doses of aspirin every day.  Taking vitamin and mineral supplements as recommended by your health care provider. What happens during an annual well check? The services and screenings done by your health care provider during your annual well check will depend on your age, overall health, lifestyle risk factors, and family history of disease. Counseling  Your health care provider may ask you questions about your:  Alcohol use.  Tobacco use.  Drug use.  Emotional well-being.  Home and relationship well-being.  Sexual  activity.  Eating habits.  History of falls.  Memory and ability to understand (cognition).  Work and work Statistician. Screening  You may have the following tests or measurements:  Height, weight, and BMI.  Blood pressure.  Lipid and cholesterol levels. These may be checked every 5 years, or more frequently if you are over 63 years old.  Skin check.  Lung cancer screening. You may have this screening every year starting at age 55 if you have a 30-pack-year history of smoking and currently smoke or have quit within the past 15 years.  Fecal occult blood test (FOBT) of the stool. You may have this test every year starting at age 28.  Flexible sigmoidoscopy or colonoscopy. You may have a sigmoidoscopy every 5 years or a colonoscopy every 10 years starting at age 10.  Prostate cancer screening. Recommendations will vary depending on your family history and other risks.  Hepatitis C blood test.  Hepatitis B blood test.  Sexually transmitted disease (STD) testing.  Diabetes screening. This is done by checking your blood sugar (glucose) after you have not eaten for a while (fasting). You may have this done every 1-3 years.  Abdominal aortic aneurysm (AAA) screening. You may need this if you are a current or former smoker.  Osteoporosis. You may be screened starting at age 83 if you are at high risk. Talk with your health care provider about your test results, treatment options, and if necessary, the need for more tests. Vaccines  Your health care provider may recommend certain vaccines, such as:  Influenza vaccine. This is recommended every year.  Tetanus, diphtheria, and acellular pertussis (Tdap, Td) vaccine. You may need a Td booster every 10 years.  Zoster vaccine. You may need this after age 56.  Pneumococcal 13-valent conjugate (PCV13) vaccine. One dose is recommended after age 76.  Pneumococcal polysaccharide (PPSV23) vaccine. One dose is recommended after age  43. Talk to your health care provider about which screenings and vaccines you need and how often you need them. This information is not intended to replace advice given to you by your health care provider. Make sure you discuss any questions you have with your health care provider. Document Released: 03/14/2015 Document Revised: 11/05/2015 Document Reviewed: 12/17/2014 Elsevier Interactive Patient Education  2017 [employer] Prevention in the Home Falls can cause injuries. They can happen to people of all ages. There are many things you can do to make your home safe and to help prevent falls. What can I do on the outside of my home?  Regularly fix the edges of walkways and driveways and fix any cracks.  Remove anything that might make you trip as you walk through a door, such as a raised step or threshold.  Trim any bushes or trees on the path to your home.  Use bright outdoor lighting.  Clear any walking paths of anything that might make someone trip, such as rocks or tools.  Regularly check to see if handrails are loose or broken. Make sure that both sides of any steps have handrails.  Any raised decks and porches should have guardrails on the edges.  Have any leaves, snow, or ice cleared regularly.  Use sand or salt on walking paths during winter.  Clean up any spills in your garage right away. This includes oil or grease spills. What can I do in the bathroom?  Use night lights.  Install grab bars by the toilet and in the tub and shower. Do not use towel bars as grab bars.  Use non-skid mats or decals in the tub or shower.  If you need to sit down in the shower, use a plastic, non-slip stool.  Keep the floor dry. Clean up any water that spills on the floor as soon as it happens.  Remove soap buildup in the tub or shower regularly.  Attach bath mats securely with double-sided non-slip rug tape.  Do not have throw rugs and other things on the floor that can make  you trip. What can I do in the bedroom?  Use night lights.  Make sure that you have a light by your bed that is easy to reach.  Do not use any sheets or blankets that are too big for your bed. They should not hang down onto the floor.  Have a firm chair that has side arms. You can use this for support while you get dressed.  Do not have throw rugs and other things on the floor that can make you trip. What can I do in the kitchen?  Clean up any spills right away.  Avoid walking on wet floors.  Keep items that you use a lot in easy-to-reach places.  If you need to reach something above you, use a strong step stool that has a grab bar.  Keep electrical cords out of the way.  Do not use floor polish or wax that makes floors slippery. If you must use wax, use non-skid floor wax.  Do not have throw rugs and other things on the floor that can make you trip. What can I do with my stairs?  Do not leave any items on the stairs.  Make sure that there are  handrails on both sides of the stairs and use them. Fix handrails that are broken or loose. Make sure that handrails are as long as the stairways.  Check any carpeting to make sure that it is firmly attached to the stairs. Fix any carpet that is loose or worn.  Avoid having throw rugs at the top or bottom of the stairs. If you do have throw rugs, attach them to the floor with carpet tape.  Make sure that you have a light switch at the top of the stairs and the bottom of the stairs. If you do not have them, ask someone to add them for you. What else can I do to help prevent falls?  Wear shoes that:  Do not have high heels.  Have rubber bottoms.  Are comfortable and fit you well.  Are closed at the toe. Do not wear sandals.  If you use a stepladder:  Make sure that it is fully opened. Do not climb a closed stepladder.  Make sure that both sides of the stepladder are locked into place.  Ask someone to hold it for you, if  possible.  Clearly mark and make sure that you can see:  Any grab bars or handrails.  First and last steps.  Where the edge of each step is.  Use tools that help you move around (mobility aids) if they are needed. These include:  Canes.  Walkers.  Scooters.  Crutches.  Turn on the lights when you go into a dark area. Replace any light bulbs as soon as they burn out.  Set up your furniture so you have a clear path. Avoid moving your furniture around.  If any of your floors are uneven, fix them.  If there are any pets around you, be aware of where they are.  Review your medicines with your doctor. Some medicines can make you feel dizzy. This can increase your chance of falling. Ask your doctor what other things that you can do to help prevent falls. This information is not intended to replace advice given to you by your health care provider. Make sure you discuss any questions you have with your health care provider. Document Released: 12/12/2008 Document Revised: 07/24/2015 Document Reviewed: 03/22/2014 Elsevier Interactive Patient Education  2017 [employer].

## 2019-12-06 ENCOUNTER — Other Ambulatory Visit: Payer: Self-pay | Admitting: Nurse Practitioner

## 2019-12-06 DIAGNOSIS — R600 Localized edema: Secondary | ICD-10-CM

## 2019-12-10 ENCOUNTER — Ambulatory Visit (INDEPENDENT_AMBULATORY_CARE_PROVIDER_SITE_OTHER): Payer: PPO | Admitting: Nurse Practitioner

## 2019-12-10 ENCOUNTER — Encounter: Payer: Self-pay | Admitting: Nurse Practitioner

## 2019-12-10 ENCOUNTER — Other Ambulatory Visit: Payer: Self-pay

## 2019-12-10 VITALS — BP 128/60 | HR 71 | Temp 99.1°F | Ht 71.0 in | Wt 208.0 lb

## 2019-12-10 DIAGNOSIS — Z23 Encounter for immunization: Secondary | ICD-10-CM

## 2019-12-10 DIAGNOSIS — E1122 Type 2 diabetes mellitus with diabetic chronic kidney disease: Secondary | ICD-10-CM | POA: Diagnosis not present

## 2019-12-10 DIAGNOSIS — N1832 Chronic kidney disease, stage 3b: Secondary | ICD-10-CM | POA: Diagnosis not present

## 2019-12-10 DIAGNOSIS — E1169 Type 2 diabetes mellitus with other specified complication: Secondary | ICD-10-CM

## 2019-12-10 DIAGNOSIS — I130 Hypertensive heart and chronic kidney disease with heart failure and stage 1 through stage 4 chronic kidney disease, or unspecified chronic kidney disease: Secondary | ICD-10-CM | POA: Diagnosis not present

## 2019-12-10 DIAGNOSIS — N1831 Chronic kidney disease, stage 3a: Secondary | ICD-10-CM

## 2019-12-10 DIAGNOSIS — R7989 Other specified abnormal findings of blood chemistry: Secondary | ICD-10-CM | POA: Diagnosis not present

## 2019-12-10 DIAGNOSIS — C679 Malignant neoplasm of bladder, unspecified: Secondary | ICD-10-CM

## 2019-12-10 DIAGNOSIS — M79675 Pain in left toe(s): Secondary | ICD-10-CM | POA: Diagnosis not present

## 2019-12-10 DIAGNOSIS — D508 Other iron deficiency anemias: Secondary | ICD-10-CM | POA: Diagnosis not present

## 2019-12-10 DIAGNOSIS — E785 Hyperlipidemia, unspecified: Secondary | ICD-10-CM | POA: Diagnosis not present

## 2019-12-10 DIAGNOSIS — I350 Nonrheumatic aortic (valve) stenosis: Secondary | ICD-10-CM | POA: Diagnosis not present

## 2019-12-10 DIAGNOSIS — I5022 Chronic systolic (congestive) heart failure: Secondary | ICD-10-CM

## 2019-12-10 DIAGNOSIS — D692 Other nonthrombocytopenic purpura: Secondary | ICD-10-CM | POA: Diagnosis not present

## 2019-12-10 LAB — BAYER DCA HB A1C WAIVED: HB A1C (BAYER DCA - WAIVED): 6.3 % (ref <7.0)

## 2019-12-10 NOTE — Assessment & Plan Note (Signed)
Chronic, ongoing.  Continue collaboration with cardiology and current medication regimen.  BMP today.  Recommend: - Reminded to call for an overnight weight gain of >2 pounds or a weekly weight weight of >5 pounds - not adding salt to his food and has been reading food labels. Reviewed the importance of keeping daily sodium intake to 2000mg  daily

## 2019-12-10 NOTE — Assessment & Plan Note (Addendum)
Chronic, ongoing.  Initial BP elevated, but repeat manually at goal and home readings in past at goal for age.  Avoid hypotension. Continue current medication regimen and adjust as needed.  BMP and TSH today.  Urine ALB 150 and A:C >300 last visit.  Losartan for kidney protection.  Refer to nephrology if worsening kidney function.  Recommend he monitor his BP regularly at home and follow DASH diet.  Continue to collaborate with cardiology.

## 2019-12-10 NOTE — Assessment & Plan Note (Signed)
Followed by cardiology.  Last echo June 2021.  Continue to collaborate with cardiology. 

## 2019-12-10 NOTE — Assessment & Plan Note (Signed)
Within normal range last visit, will recheck today as has been almost a year since last check.

## 2019-12-10 NOTE — Assessment & Plan Note (Signed)
Continue to collaborate with urology, at length discussion on goals of care will continue at visits. 

## 2019-12-10 NOTE — Assessment & Plan Note (Signed)
Chronic, ongoing.  Continue current medication regimen and adjust as needed.  Lipid panel today, recent LDL < 70. 

## 2019-12-10 NOTE — Assessment & Plan Note (Signed)
As evidenced by scattered bruises bilaterall upper extremities.  Gentle skin cleansers at home and lotion.  Monitor for skin breakdown, report to provider if present. 

## 2019-12-10 NOTE — Assessment & Plan Note (Signed)
Chronic, ongoing with A1C 6.3% today.   Urine ALB 150 and A:C >300 last visit.  Losartan for kidney protection.  BMP today.  Continue current medication regimen and adjust as needed.  Return in 3 months.  Avoid hypoglycemia due to advanced age.  Will refer him to nephrology if worsening kidney function presents.

## 2019-12-10 NOTE — Patient Instructions (Signed)

## 2019-12-10 NOTE — Assessment & Plan Note (Signed)
Ongoing, stable.  Continue daily iron.  Recheck iron and B12 level today + CBC.

## 2019-12-10 NOTE — Progress Notes (Signed)
BP 128/60 (BP Location: Left Arm)   Pulse 71   Temp 99.1 F (37.3 C) (Oral)   Ht 5\' 11"  (1.803 m)   Wt 208 lb (94.3 kg)   SpO2 97%   BMI 29.01 kg/m    Subjective:    Patient ID: John [name], male    DOB: 1940/10/25, 79 y.o.   MRN: [id number]  HPI: John [name] is a 79 y.o. male  Chief Complaint  Patient presents with  . Diabetes  . Hypertension   DIABETES Continues on Janumet XR 50-1000. Last A1C was 6.8%. He reports some left little toe discomfort over past couple weeks. Denies any skin breakdown.  No recent trauma to area.   Hypoglycemic episodes:no Polydipsia/polyuria:no Visual disturbance:no Chest pain:no Paresthesias:no Glucose Monitoring:no Accucheck frequency: Not Checking Fasting glucose: Post prandial: Evening: Before meals: Taking Insulin?:no Long acting insulin: Short acting insulin: Blood Pressure Monitoring:rarely Retinal Examination:Not Up To Date Foot Exam:Up to Date Pneumovax:Up to Date Influenza:Up to Date Aspirin:yes  ANEMIA History of low iron and B12 levels.  Last labs iron 110, B12 1027, and H/H 12.8/40.2 in December -- improved.  He continues supplements daily. Anemia status: stable Etiology of anemia: unknown Duration of anemia treatment: months Compliance with treatment: good compliance Iron supplementation side effects: no Severity of anemia: mild Fatigue: no Decreased exercise tolerance: no  Dyspnea on exertion: no Palpitations: no Bleeding: no Pica: no  HYPERTENSION / HYPERLIPIDEMIA/HF Continues on Lasix and Metoprolol + Losartan + Lipitor, ASA. Last saw Dr. Josefa Half 10/15/19 with no medications changes -- is to return in 3 months. Has moderate underlying aortic stenosis noted on past imaging, which is monitored by cardiology.  Last echo 08/02/19 -- mild LV dysfunction with LVH, EF 45%.  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 Satisfied with current treatment?yes Duration of hypertension:chronic BP monitoring frequency:not checking BP range: BP medication side effects:no Duration of hyperlipidemia:chronic Cholesterol medication side effects:no Cholesterol supplements: none Medication compliance:good compliance Aspirin:yes Recent stressors:no Recurrent headaches:no Visual changes:no Palpitations:no Dyspnea:no Chest pain:no Lower extremity edema:yes, at baseline Dizzy/lightheaded:no  BLADDER CANCER: Monitor by urology with last visit with Dr. Bernardo Heater on 08/27/19 and had recent cystoscopy on 08/27/19.  He does endorse that each time he has these procedures it takes longer to recover, so he is pondering whether to continue doing these as he reports belly pain lasts for 6 weeks afterwards.  Mr. John [name] discussed the treatment that they want to do, 6-week course of intravesical gemcitabine, and he is unsure if he wants to go through this.    Relevant past medical, surgical, family and social history reviewed and updated as indicated. Interim medical history since our last visit reviewed. Allergies and medications reviewed and updated.  Review of Systems  Constitutional: Negative for activity change, diaphoresis, fatigue and fever.  Respiratory: Negative for cough, chest tightness, shortness of breath and wheezing.   Cardiovascular: Negative for chest pain, palpitations and leg swelling.  Gastrointestinal: Negative.   Endocrine: Negative for cold intolerance, heat intolerance, polydipsia, polyphagia and polyuria.  Neurological: Negative.   Psychiatric/Behavioral: Negative.     Per HPI unless specifically indicated above     Objective:    BP 128/60 (BP Location: Left Arm)   Pulse 71   Temp 99.1 F (37.3 C) (Oral)   Ht 5\' 11"  (1.803 m)   Wt 208 lb (94.3 kg)   SpO2 97%   BMI 29.01 kg/m   Wt Readings from Last 3 Encounters:  12/10/19 208  lb (94.3 kg)  11/26/19 208  lb (94.3 kg)  08/31/19 207 lb 12.8 oz (94.3 kg)    Physical Exam Vitals and nursing note reviewed.  Constitutional:      General: He is awake. He is not in acute distress.    Appearance: He is well-developed and overweight. He is not ill-appearing.  HENT:     Head: Normocephalic and atraumatic.     Right Ear: Hearing normal. No drainage.     Left Ear: Hearing normal. No drainage.  Eyes:     General: Lids are normal.        Right eye: No discharge.        Left eye: No discharge.     Conjunctiva/sclera: Conjunctivae normal.     Pupils: Pupils are equal, round, and reactive to light.  Neck:     Thyroid: No thyromegaly.     Vascular: No carotid bruit.  Cardiovascular:     Rate and Rhythm: Normal rate and regular rhythm.     Heart sounds: S1 normal and S2 normal. Murmur heard.  Systolic murmur is present with a grade of 2/6.  No gallop.   Pulmonary:     Effort: Pulmonary effort is normal. No accessory muscle usage or respiratory distress.     Breath sounds: Normal breath sounds.  Abdominal:     General: Bowel sounds are normal.     Palpations: Abdomen is soft.  Musculoskeletal:        General: Normal range of motion.     Cervical back: Normal range of motion and neck supple.     Right lower leg: Edema (1+) present.     Left lower leg: Edema (1+) present.  Skin:    General: Skin is warm and dry.     Capillary Refill: Capillary refill takes less than 2 seconds.     Comments: Scattered pale purple bruises noted to bilateral upper extremities, skin intact.  Neurological:     Mental Status: He is alert and oriented to person, place, and time.     Deep Tendon Reflexes: Reflexes are normal and symmetric.  Psychiatric:        Attention and Perception: Attention normal.        Mood and Affect: Mood normal.        Speech: Speech normal.        Behavior: Behavior normal. Behavior is cooperative.        Thought Content: Thought content normal.    Diabetic Foot Exam - Simple    Simple Foot Form Visual Inspection See comments: Yes Sensation Testing Intact to touch and monofilament testing bilaterally: Yes Pulse Check See comments: Yes Comments Left foot has a callus inner left 5th toe, intact.  Thick toenails bilaterally.  1+ pulses bilaterally.  Good sensation.    Results for orders placed or performed in visit on 08/31/19  Bayer DCA Hb A1c Waived  Result Value Ref Range   HB A1C (BAYER DCA - WAIVED) 6.8 <4.7 %  Basic metabolic panel  Result Value Ref Range   Glucose 107 (H) 65 - 99 mg/dL   BUN 23 8 - 27 mg/dL   Creatinine, Ser 1.11 0.76 - 1.27 mg/dL   GFR calc non Af Amer 62 >59 mL/min/1.73   GFR calc Af Amer 72 >59 mL/min/1.73   BUN/Creatinine Ratio 21 10 - 24   Sodium 140 134 - 144 mmol/L   Potassium 4.2 3.5 - 5.2 mmol/L   Chloride 100 96 - 106 mmol/L  CO2 29 20 - 29 mmol/L   Calcium 9.4 8.6 - 10.2 mg/dL      Assessment & Plan:   Problem List Items Addressed This Visit      Cardiovascular and Mediastinum   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  Initial BP elevated, but repeat manually at goal and home readings in past at goal for age.  Avoid hypotension. Continue current medication regimen and adjust as needed.  BMP and TSH today.  Urine ALB 150 and A:C >300 last visit.  Losartan for kidney protection.  Refer to nephrology if worsening kidney function.  Recommend he monitor his BP regularly at home and follow DASH diet.  Continue to collaborate with cardiology.      Relevant Orders   Basic metabolic panel   Aortic valve stenosis, moderate    Followed by cardiology.  Last echo June 2021.  Continue to collaborate with cardiology.      Senile purpura ([place])    As evidenced by scattered bruises bilaterall upper extremities.  Gentle skin cleansers at home and lotion.  Monitor for skin breakdown, report to provider if present.      Chronic systolic heart failure (HCC)    Chronic, ongoing.  Continue  collaboration with cardiology and current medication regimen.  BMP today.  Recommend: - Reminded to call for an overnight weight gain of >2 pounds or a weekly weight weight of >5 pounds - not adding salt to his food and has been reading food labels. Reviewed the importance of keeping daily sodium intake to 2000mg  daily         Endocrine   Hyperlipidemia associated with type 2 diabetes mellitus (HCC)    Chronic, ongoing.  Continue current medication regimen and adjust as needed.  Lipid panel today, recent LDL <70.      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   Urine ALB 150 and A:C >300 last visit.  Losartan for kidney protection.  BMP today.  Continue current medication regimen and adjust as needed.  Return in 3 months.  Avoid hypoglycemia due to advanced age.  Will refer him to nephrology if worsening kidney function presents.      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      CKD (chronic kidney disease) stage 3, GFR 30-59 ml/min (HCC)    Chronic, stable with recent labs showing improved CRT and GFR last visit.  Continue Losartan for kidney protection.  BMP today.  Refer to nephrology as needed.        Other   Iron deficiency anemia    Ongoing, stable.  Continue daily iron.  Recheck iron and B12 level today + CBC.      Relevant Orders   CBC with Differential/Platelet   Vitamin B12   Iron, TIBC and Ferritin Panel   Elevated TSH    Within normal range last visit, will recheck today as has been almost a year since last check.      Relevant Orders   TSH    Other  Visit Diagnoses    Toe pain, left       To left 5th toe with  callus present at inner aspect and onychomycosis.  Referral to podiatry placed.    Need for influenza vaccination       Relevant Orders   Flu Vaccine QUAD High Dose(Fluad) (Completed)       Follow up plan: Return in about 3 months (around 03/11/2020) for T2DM, HTN/HLD, Bladder CA, HF.

## 2019-12-10 NOTE — Assessment & Plan Note (Signed)
Chronic, stable with recent labs showing improved CRT and GFR last visit.  Continue Losartan for kidney protection.  BMP today.  Refer to nephrology as needed.

## 2019-12-11 LAB — CBC WITH DIFFERENTIAL/PLATELET
Basophils Absolute: 0 10*3/uL (ref 0.0–0.2)
Basos: 0 %
EOS (ABSOLUTE): 0.4 10*3/uL (ref 0.0–0.4)
Eos: 5 %
Hematocrit: 40 % (ref 37.5–51.0)
Hemoglobin: 12.8 g/dL — ABNORMAL LOW (ref 13.0–17.7)
Immature Grans (Abs): 0 10*3/uL (ref 0.0–0.1)
Immature Granulocytes: 0 %
Lymphocytes Absolute: 1.8 10*3/uL (ref 0.7–3.1)
Lymphs: 23 %
MCH: 28.8 pg (ref 26.6–33.0)
MCHC: 32 g/dL (ref 31.5–35.7)
MCV: 90 fL (ref 79–97)
Monocytes Absolute: 0.5 10*3/uL (ref 0.1–0.9)
Monocytes: 7 %
Neutrophils Absolute: 5.1 10*3/uL (ref 1.4–7.0)
Neutrophils: 65 %
Platelets: 180 10*3/uL (ref 150–450)
RBC: 4.45 x10E6/uL (ref 4.14–5.80)
RDW: 12.2 % (ref 11.6–15.4)
WBC: 7.8 10*3/uL (ref 3.4–10.8)

## 2019-12-11 LAB — LIPID PANEL W/O CHOL/HDL RATIO
Cholesterol, Total: 119 mg/dL (ref 100–199)
HDL: 48 mg/dL (ref >39)
LDL Chol Calc (NIH): 49 mg/dL (ref 0–99)
Triglycerides: 121 mg/dL (ref 0–149)
VLDL Cholesterol Cal: 22 mg/dL (ref 5–40)

## 2019-12-11 LAB — BASIC METABOLIC PANEL
BUN/Creatinine Ratio: 19 (ref 10–24)
BUN: 19 mg/dL (ref 8–27)
CO2: 25 mmol/L (ref 20–29)
Calcium: 9.1 mg/dL (ref 8.6–10.2)
Chloride: 104 mmol/L (ref 96–106)
Creatinine, Ser: 1 mg/dL (ref 0.76–1.27)
GFR calc Af Amer: 81 mL/min/{1.73_m2} (ref >59)
GFR calc non Af Amer: 70 mL/min/{1.73_m2} (ref >59)
Glucose: 124 mg/dL — ABNORMAL HIGH (ref 65–99)
Potassium: 4.1 mmol/L (ref 3.5–5.2)
Sodium: 146 mmol/L — ABNORMAL HIGH (ref 134–144)

## 2019-12-11 LAB — IRON,TIBC AND FERRITIN PANEL
Ferritin: 158 ng/mL (ref 30–400)
Iron Saturation: 28 % (ref 15–55)
Iron: 74 ug/dL (ref 38–169)
Total Iron Binding Capacity: 264 ug/dL (ref 250–450)
UIBC: 190 ug/dL (ref 111–343)

## 2019-12-11 LAB — TSH: TSH: 3.38 u[IU]/mL (ref 0.450–4.500)

## 2019-12-11 LAB — VITAMIN B12: Vitamin B-12: 1403 pg/mL — ABNORMAL HIGH (ref 232–1245)

## 2019-12-11 NOTE — Progress Notes (Signed)
Good afternoon, please let Rhiley know that overall labs continue to look good with exception of mild elevation in sodium (salt) level again, I recommend cutting back on salt intake and drinking more water throughout daytime hours and we will recheck at next visit.  Anemia remains stable at this time and B12 level improved.  Have a great day!! Keep being awesome!!  Thank you for allowing me to participate in your care. Kindest regards, Adenike Shidler

## 2019-12-17 DIAGNOSIS — E119 Type 2 diabetes mellitus without complications: Secondary | ICD-10-CM | POA: Diagnosis not present

## 2019-12-17 DIAGNOSIS — M2042 Other hammer toe(s) (acquired), left foot: Secondary | ICD-10-CM | POA: Diagnosis not present

## 2019-12-17 DIAGNOSIS — M79672 Pain in left foot: Secondary | ICD-10-CM | POA: Diagnosis not present

## 2019-12-28 ENCOUNTER — Other Ambulatory Visit: Payer: Self-pay

## 2019-12-28 ENCOUNTER — Encounter: Payer: Self-pay | Admitting: Urology

## 2019-12-28 ENCOUNTER — Ambulatory Visit: Payer: PPO | Admitting: Urology

## 2019-12-28 VITALS — BP 153/77 | HR 78 | Ht 69.0 in | Wt 208.0 lb

## 2019-12-28 DIAGNOSIS — C679 Malignant neoplasm of bladder, unspecified: Secondary | ICD-10-CM | POA: Diagnosis not present

## 2019-12-28 LAB — MICROSCOPIC EXAMINATION: Bacteria, UA: NONE SEEN

## 2019-12-28 LAB — URINALYSIS, COMPLETE
Bilirubin, UA: NEGATIVE
Glucose, UA: NEGATIVE
Ketones, UA: NEGATIVE
Leukocytes,UA: NEGATIVE
Nitrite, UA: NEGATIVE
Protein,UA: NEGATIVE
RBC, UA: NEGATIVE
Specific Gravity, UA: 1.01 (ref 1.005–1.030)
Urobilinogen, Ur: 0.2 mg/dL (ref 0.2–1.0)
pH, UA: 5 (ref 5.0–7.5)

## 2019-12-28 MED ORDER — LEVOFLOXACIN 500 MG PO TABS
500.0000 mg | ORAL_TABLET | Freq: Once | ORAL | Status: AC
  Administered 2019-12-28: 500 mg via ORAL

## 2019-12-29 NOTE — Progress Notes (Signed)
   12/29/19  CC:  Chief Complaint  Patient presents with  . 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 Vitolo has no complaints today.  Denies gross hematuria  Blood pressure (!) 153/77, pulse 78, height 5\' 9"  (1.753 m), weight 208 lb (94.3 kg). NED. A&Ox3.   No respiratory distress   Abd soft, NT, ND Normal phallus with bilateral descended testicles  Cystoscopy Procedure Note  Patient identification was confirmed, informed consent was obtained, and patient was prepped using Betadine solution.  Lidocaine jelly was administered per urethral meatus.     Pre-Procedure: - Inspection reveals a normal caliber ureteral meatus.  Procedure: The flexible cystoscope was introduced without difficulty - No urethral strictures/lesions are present. - Moderate lateral lobe enlargement prostate  - Mild elevation bladder neck - Bilateral ureteral orifices identified - Bladder mucosa  reveals no ulcers, tumors, or lesions - No bladder stones -Moderate trabeculation  Retroflexion shows no evidence recurrent tumor   Post-Procedure: - Patient tolerated the procedure well  Assessment/ Plan:  No evidence of recurrent bladder tumor  Follow-up surveillance cystoscopy 6 months   Abbie Sons, MD

## 2020-01-02 DIAGNOSIS — L570 Actinic keratosis: Secondary | ICD-10-CM | POA: Diagnosis not present

## 2020-02-02 ENCOUNTER — Other Ambulatory Visit: Payer: Self-pay | Admitting: Nurse Practitioner

## 2020-02-02 NOTE — Telephone Encounter (Signed)
Requested Prescriptions  Pending Prescriptions Disp Refills  . losartan (COZAAR) 25 MG tablet [Pharmacy Med Name: Losartan Potassium 25 MG Oral Tablet] 45 tablet 0    Sig: Take 1/2 (one-half) tablet by mouth once daily     Cardiovascular:  Angiotensin Receptor Blockers Failed - 02/02/2020 12:54 PM      Failed - Last BP in normal range    BP Readings from Last 1 Encounters:  12/28/19 (!) 153/77         Passed - Cr in normal range and within 180 days    Creatinine  Date Value Ref Range Status  12/13/2012 0.98 0.60 - 1.30 mg/dL Final   Creatinine, Ser  Date Value Ref Range Status  12/10/2019 1.00 0.76 - 1.27 mg/dL Final         Passed - K in normal range and within 180 days    Potassium  Date Value Ref Range Status  12/10/2019 4.1 3.5 - 5.2 mmol/L Final  12/13/2012 3.4 (L) 3.5 - 5.1 mmol/L Final         Passed - Patient is not pregnant      Passed - Valid encounter within last 6 months    Recent Outpatient Visits          1 month ago Type 2 diabetes mellitus with stage 3b chronic kidney disease, without long-term current use of insulin ([place])   [place], Jolene T, NP   5 months ago Type 2 diabetes mellitus with stage 3a chronic kidney disease, without long-term current use of insulin ([place])   [place], Jolene T, NP   8 months ago Type 2 diabetes mellitus with stage 3a chronic kidney disease, without long-term current use of insulin ([place])   [place], Barbaraann Faster, NP   11 months ago Annual physical exam   [place], Jolene T, NP   1 year ago Type 2 diabetes mellitus with stage 3 chronic kidney disease, without long-term current use of insulin ([place])   [place], Barbaraann Faster, NP      Future Appointments            In 1 month [place], Barbaraann Faster, NP [facility]   In 9 months  [facility]           . atorvastatin (LIPITOR) 40 MG  tablet [Pharmacy Med Name: Atorvastatin Calcium 40 MG Oral Tablet] 90 tablet 3    Sig: TAKE 1 TABLET BY MOUTH ONCE DAILY AT 6 PM     Cardiovascular:  Antilipid - Statins Failed - 02/02/2020 12:54 PM      Failed - LDL in normal range and within 360 days    LDL Chol Calc (NIH)  Date Value Ref Range Status  12/10/2019 49 0 - 99 mg/dL Final         Passed - Total Cholesterol in normal range and within 360 days    Cholesterol, Total  Date Value Ref Range Status  12/10/2019 119 100 - 199 mg/dL Final   Cholesterol Piccolo, Waived  Date Value Ref Range Status  05/19/2015 192 <200 mg/dL Final    Comment:                            Desirable                <200  Borderline High      200- 239                         High                     >239          Passed - HDL in normal range and within 360 days    HDL  Date Value Ref Range Status  12/10/2019 48 >39 mg/dL Final         Passed - Triglycerides in normal range and within 360 days    Triglycerides  Date Value Ref Range Status  12/10/2019 121 0 - 149 mg/dL Final   Triglycerides Piccolo,Waived  Date Value Ref Range Status  05/19/2015 106 <150 mg/dL Final    Comment:                            Normal                   <150                         Borderline High     150 - 199                         High                200 - 499                         Very High                >499          Passed - Patient is not pregnant      Passed - Valid encounter within last 12 months    Recent Outpatient Visits          1 month ago Type 2 diabetes mellitus with stage 3b chronic kidney disease, without long-term current use of insulin ([place])   [place], Jolene T, NP   5 months ago Type 2 diabetes mellitus with stage 3a chronic kidney disease, without long-term current use of insulin ([place])   [place], Jolene T, NP   8 months ago Type 2 diabetes mellitus with stage  3a chronic kidney disease, without long-term current use of insulin ([place])   [place], Barbaraann Faster, NP   11 months ago Annual physical exam   [place], Jolene T, NP   1 year ago Type 2 diabetes mellitus with stage 3 chronic kidney disease, without long-term current use of insulin ([place])   [place], Barbaraann Faster, NP      Future Appointments            In 1 month [place], Barbaraann Faster, NP [facility]   In 9 months  [facility]

## 2020-03-03 ENCOUNTER — Other Ambulatory Visit: Payer: Self-pay | Admitting: Nurse Practitioner

## 2020-03-03 DIAGNOSIS — R600 Localized edema: Secondary | ICD-10-CM

## 2020-03-12 ENCOUNTER — Other Ambulatory Visit: Payer: Self-pay

## 2020-03-12 ENCOUNTER — Ambulatory Visit (INDEPENDENT_AMBULATORY_CARE_PROVIDER_SITE_OTHER): Payer: PPO | Admitting: Nurse Practitioner

## 2020-03-12 ENCOUNTER — Encounter: Payer: Self-pay | Admitting: Nurse Practitioner

## 2020-03-12 VITALS — BP 137/72 | HR 56 | Temp 98.3°F | Ht 71.06 in | Wt 205.6 lb

## 2020-03-12 DIAGNOSIS — D692 Other nonthrombocytopenic purpura: Secondary | ICD-10-CM | POA: Diagnosis not present

## 2020-03-12 DIAGNOSIS — I5022 Chronic systolic (congestive) heart failure: Secondary | ICD-10-CM

## 2020-03-12 DIAGNOSIS — E785 Hyperlipidemia, unspecified: Secondary | ICD-10-CM

## 2020-03-12 DIAGNOSIS — N1831 Chronic kidney disease, stage 3a: Secondary | ICD-10-CM

## 2020-03-12 DIAGNOSIS — E1122 Type 2 diabetes mellitus with diabetic chronic kidney disease: Secondary | ICD-10-CM

## 2020-03-12 DIAGNOSIS — C679 Malignant neoplasm of bladder, unspecified: Secondary | ICD-10-CM

## 2020-03-12 DIAGNOSIS — I350 Nonrheumatic aortic (valve) stenosis: Secondary | ICD-10-CM

## 2020-03-12 DIAGNOSIS — E1169 Type 2 diabetes mellitus with other specified complication: Secondary | ICD-10-CM

## 2020-03-12 DIAGNOSIS — D508 Other iron deficiency anemias: Secondary | ICD-10-CM | POA: Diagnosis not present

## 2020-03-12 DIAGNOSIS — I130 Hypertensive heart and chronic kidney disease with heart failure and stage 1 through stage 4 chronic kidney disease, or unspecified chronic kidney disease: Secondary | ICD-10-CM

## 2020-03-12 LAB — MICROALBUMIN, URINE WAIVED
Creatinine, Urine Waived: 100 mg/dL (ref 10–300)
Microalb, Ur Waived: 10 mg/L (ref 0–19)
Microalb/Creat Ratio: <30 mg/g (ref <30)

## 2020-03-12 LAB — BAYER DCA HB A1C WAIVED: HB A1C (BAYER DCA - WAIVED): 6.5 % (ref <7.0)

## 2020-03-12 MED ORDER — KLOR-CON M10 10 MEQ PO TBCR: 10.0000 meq | EXTENDED_RELEASE_TABLET | Freq: Every day | ORAL | 4 refills | Status: DC

## 2020-03-12 MED ORDER — METOPROLOL TARTRATE 25 MG PO TABS: 25.0000 mg | ORAL_TABLET | Freq: Two times a day (BID) | ORAL | 4 refills | Status: DC

## 2020-03-12 MED ORDER — FUROSEMIDE 40 MG PO TABS: ORAL_TABLET | ORAL | 4 refills | Status: DC

## 2020-03-12 MED ORDER — JANUMET XR 50-1000 MG PO TB24: 1.0000 | ORAL_TABLET | Freq: Two times a day (BID) | ORAL | 4 refills | Status: DC

## 2020-03-12 MED ORDER — ATORVASTATIN CALCIUM 40 MG PO TABS: ORAL_TABLET | ORAL | 4 refills | Status: DC

## 2020-03-12 MED ORDER — LOSARTAN POTASSIUM 25 MG PO TABS: ORAL_TABLET | ORAL | 4 refills | Status: DC

## 2020-03-12 NOTE — Assessment & Plan Note (Signed)
Ongoing, stable.  Continue daily iron.  Recheck iron and B12 level next visit + CBC -- recent levels stable.

## 2020-03-12 NOTE — Assessment & Plan Note (Signed)
Followed by cardiology.  Last echo June 2021.  Continue to collaborate with cardiology. 

## 2020-03-12 NOTE — Assessment & Plan Note (Signed)
Chronic, stable with recent labs showing improved CRT and GFR last visit.  Continue Losartan for kidney protection.  BMP today.  Refer to nephrology as needed. 

## 2020-03-12 NOTE — Assessment & Plan Note (Signed)
Chronic, ongoing.  Continue collaboration with cardiology and current medication regimen.  BMP today.  Recommend: - Reminded to call for an overnight weight gain of >2 pounds or a weekly weight weight of >5 pounds - not adding salt to his food and has been reading food labels. Reviewed the importance of keeping daily sodium intake to <2000mg daily  

## 2020-03-12 NOTE — Assessment & Plan Note (Signed)
Continue to collaborate with urology, at length discussion on goals of care will continue at visits. 

## 2020-03-12 NOTE — Assessment & Plan Note (Signed)
Chronic, ongoing. BP at goal for age.  Avoid hypotension. Continue current medication regimen and adjust as needed.  BMP and TSH today.  Urine ALB improved at 10 this visit and A:C <30.  Losartan for kidney protection.  Refer to nephrology if worsening kidney function.  Recommend he monitor his BP regularly at home and follow DASH diet.  Continue to collaborate with cardiology.

## 2020-03-12 NOTE — Progress Notes (Signed)
BP 137/72   Pulse (!) 56   Temp 98.3 F (36.8 C) (Oral)   Ht 5' 11.06" (1.805 m)   Wt 205 lb 9.6 oz (93.3 kg)   SpO2 97%   BMI 28.62 kg/m    Subjective:    Patient ID: John [name], male    DOB: [birth year], 80 y.o.   MRN: [id number]  HPI: John [name] is a 80 y.o. male  Chief Complaint  Patient presents with  . Diabetes  . Hypertension   DIABETES Continues on Janumet XR 50-1000. Last A1C was 6.3%.  Hypoglycemic episodes:no Polydipsia/polyuria:no Visual disturbance:no Chest pain:no Paresthesias:no Glucose Monitoring:no Accucheck frequency: Not Checking Fasting glucose: Post prandial: Evening: Before meals: Taking Insulin?:no Long acting insulin: Short acting insulin: Blood Pressure Monitoring:rarely Retinal Examination:Not Up To Date Foot Exam:Up to Date Pneumovax:Up to Date Influenza:Up to Date Aspirin:yes  ANEMIA History of low iron and B12 levels.  Last labs iron 110, B12 1027, and H/H 12.8/40.2 in December -- improved.  He continues supplements daily. Anemia status: stable Etiology of anemia: unknown Duration of anemia treatment: months Compliance with treatment: good compliance Iron supplementation side effects: no Severity of anemia: mild Fatigue: no Decreased exercise tolerance: no  Dyspnea on exertion: no Palpitations: no Bleeding: no Pica: no  HYPERTENSION / HYPERLIPIDEMIA/HF Continues on Lasix and Metoprolol + Losartan + Lipitor, ASA. Last saw Dr. Josefa Half 10/15/19 with no medications changes. Has moderate underlying aortic stenosis noted on past imaging, which is monitored by cardiology.  Last echo 08/02/19 -- mild LV dysfunction with LVH, EF 45%.  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 Satisfied with current treatment?yes Duration of hypertension:chronic BP monitoring frequency:not checking BP range: BP medication side  effects:no Duration of hyperlipidemia:chronic Cholesterol medication side effects:no Cholesterol supplements: none Medication compliance:good compliance Aspirin:yes Recent stressors:no Recurrent headaches:no Visual changes:no Palpitations:no Dyspnea:no Chest pain:no Lower extremity edema:yes, at baseline Dizzy/lightheaded:no  BLADDER CANCER: Monitor by urology with last visit with Dr. Bernardo Heater on 08/27/19 and had recent cystoscopy on 12/28/19.  He does endorse that each time he has these procedures it takes longer to recover, so he is pondering whether to continue doing these as he reports belly pain lasts for 6 weeks afterwards.    Relevant past medical, surgical, family and social history reviewed and updated as indicated. Interim medical history since our last visit reviewed. Allergies and medications reviewed and updated.  Review of Systems  Constitutional: Negative for activity change, diaphoresis, fatigue and fever.  Respiratory: Negative for cough, chest tightness, shortness of breath and wheezing.   Cardiovascular: Negative for chest pain, palpitations and leg swelling.  Gastrointestinal: Negative.   Endocrine: Negative for cold intolerance, heat intolerance, polydipsia, polyphagia and polyuria.  Neurological: Negative.   Psychiatric/Behavioral: Negative.     Per HPI unless specifically indicated above     Objective:    BP 137/72   Pulse (!) 56   Temp 98.3 F (36.8 C) (Oral)   Ht 5' 11.06" (1.805 m)   Wt 205 lb 9.6 oz (93.3 kg)   SpO2 97%   BMI 28.62 kg/m   Wt Readings from Last 3 Encounters:  03/12/20 205 lb 9.6 oz (93.3 kg)  12/28/19 208 lb (94.3 kg)  12/10/19 208 lb (94.3 kg)    Physical Exam Vitals and nursing note reviewed.  Constitutional:      General: He is awake. He is not in acute distress.    Appearance: He is well-developed and overweight. He is not ill-appearing.  HENT:     Head: Normocephalic and atraumatic.     Right Ear:  Hearing normal. No drainage.     Left Ear: Hearing normal. No drainage.  Eyes:     General: Lids are normal.        Right eye: No discharge.        Left eye: No discharge.     Conjunctiva/sclera: Conjunctivae normal.     Pupils: Pupils are equal, round, and reactive to light.  Neck:     Thyroid: No thyromegaly.     Vascular: No carotid bruit.  Cardiovascular:     Rate and Rhythm: Normal rate and regular rhythm.     Heart sounds: S1 normal and S2 normal. Murmur heard.   Systolic murmur is present with a grade of 2/6. No gallop.   Pulmonary:     Effort: Pulmonary effort is normal. No accessory muscle usage or respiratory distress.     Breath sounds: Normal breath sounds.  Abdominal:     General: Bowel sounds are normal.     Palpations: Abdomen is soft.  Musculoskeletal:        General: Normal range of motion.     Cervical back: Normal range of motion and neck supple.     Right lower leg: Edema (trace) present.     Left lower leg: Edema (trace) present.  Skin:    General: Skin is warm and dry.     Capillary Refill: Capillary refill takes less than 2 seconds.     Comments: Scattered pale purple bruises noted to bilateral upper extremities, skin intact.  Neurological:     Mental Status: He is alert and oriented to person, place, and time.     Deep Tendon Reflexes: Reflexes are normal and symmetric.  Psychiatric:        Attention and Perception: Attention normal.        Mood and Affect: Mood normal.        Speech: Speech normal.        Behavior: Behavior normal. Behavior is cooperative.        Thought Content: Thought content normal.    Results for orders placed or performed in visit on 12/28/19  Microscopic Examination   Urine  Result Value Ref Range   WBC, UA 0-5 0 - 5 /hpf   RBC 0-2 0 - 2 /hpf   Epithelial Cells (non renal) 0-10 0 - 10 /hpf   Bacteria, UA None seen None seen/Few  Urinalysis, Complete  Result Value Ref Range   Specific Gravity, UA 1.010 1.005 - 1.030    pH, UA 5.0 5.0 - 7.5   Color, UA Yellow Yellow   Appearance Ur Clear Clear   Leukocytes,UA Negative Negative   Protein,UA Negative Negative/Trace   Glucose, UA Negative Negative   Ketones, UA Negative Negative   RBC, UA Negative Negative   Bilirubin, UA Negative Negative   Urobilinogen, Ur 0.2 0.2 - 1.0 mg/dL   Nitrite, UA Negative Negative   Microscopic Examination See below:       Assessment & Plan:   Problem List Items Addressed This Visit      Cardiovascular and Mediastinum   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 BP at goal for age.  Avoid hypotension. Continue current medication regimen and adjust as needed.  BMP and TSH today.  Urine ALB improved at 10 this visit and A:C <30.  Losartan for kidney protection.  Refer to nephrology  if worsening kidney function.  Recommend he monitor his BP regularly at home and follow DASH diet.  Continue to collaborate with cardiology.      Relevant Medications   losartan (COZAAR) 25 MG tablet   metoprolol tartrate (LOPRESSOR) 25 MG tablet   atorvastatin (LIPITOR) 40 MG tablet   furosemide (LASIX) 40 MG tablet   Other Relevant Orders   Bayer DCA Hb A1c Waived   Basic metabolic panel   Microalbumin, Urine Waived   TSH   Aortic valve stenosis, moderate    Followed by cardiology.  Last echo June 2021.  Continue to collaborate with cardiology.      Relevant Medications   losartan (COZAAR) 25 MG tablet   metoprolol tartrate (LOPRESSOR) 25 MG tablet   atorvastatin (LIPITOR) 40 MG tablet   furosemide (LASIX) 40 MG tablet   Senile purpura (HCC)    As evidenced by scattered bruises bilaterall upper extremities.  Gentle skin cleansers at home and lotion.  Monitor for skin breakdown, report to provider if present.      Relevant Medications   losartan (COZAAR) 25 MG tablet   metoprolol tartrate (LOPRESSOR) 25 MG tablet   atorvastatin (LIPITOR) 40 MG tablet   furosemide (LASIX) 40 MG tablet    Chronic systolic heart failure (HCC)    Chronic, ongoing.  Continue collaboration with cardiology and current medication regimen.  BMP today.  Recommend: - Reminded to call for an overnight weight gain of >2 pounds or a weekly weight weight of >5 pounds - not adding salt to his food and has been reading food labels. Reviewed the importance of keeping daily sodium intake to 2000mg  daily       Relevant Medications   losartan (COZAAR) 25 MG tablet   metoprolol tartrate (LOPRESSOR) 25 MG tablet   atorvastatin (LIPITOR) 40 MG tablet   furosemide (LASIX) 40 MG tablet     Endocrine   Hyperlipidemia associated with type 2 diabetes mellitus (HCC)    Chronic, ongoing.  Continue current medication regimen and adjust as needed.  Lipid panel today, recent LDL <70.      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  Urine ALB improved at 10 this visit and A:C <30.   Losartan for kidney protection.  BMP today.  Continue current medication regimen and adjust as needed.  Return in 3 months.  Avoid hypoglycemia due to advanced age.  Will refer him to nephrology if worsening kidney function presents.      Relevant Medications   losartan (COZAAR) 25 MG tablet   SitaGLIPtin-MetFORMIN HCl (JANUMET XR) 50-1000 MG TB24   atorvastatin (LIPITOR) 40 MG tablet   Other Relevant Orders   Bayer DCA Hb A1c Waived   Microalbumin, Urine Waived     Genitourinary   Bladder cancer ([place])    Continue to collaborate with urology, at length discussion on goals of care will continue at visits.      CKD (chronic kidney disease) stage 3, GFR 30-59 ml/min (HCC)    Chronic, stable with recent labs showing improved CRT and GFR  last visit.  Continue Losartan for kidney protection.  BMP today.  Refer to nephrology  as needed.        Other   Iron deficiency anemia    Ongoing, stable.  Continue daily iron.  Recheck iron and B12 level next visit + CBC -- recent levels stable.          Follow up plan: Return in about 3 months (around 06/10/2020) for T2DM, HTN/HLD, ANEMIA, CA.

## 2020-03-12 NOTE — Patient Instructions (Signed)
Diabetes Mellitus and Nutrition, Adult When you have diabetes, or diabetes mellitus, it is very important to have healthy eating habits because your blood sugar (glucose) levels are greatly affected by what you eat and drink. Eating healthy foods in the right amounts, at about the same times every day, can help you:  Control your blood glucose.  Lower your risk of heart disease.  Improve your blood pressure.  Reach or maintain a healthy weight. What can affect my meal plan? Every person with diabetes is different, and each person has different needs for a meal plan. Your health care provider may recommend that you work with a dietitian to make a meal plan that is best for you. Your meal plan may vary depending on factors such as:  The calories you need.  The medicines you take.  Your weight.  Your blood glucose, blood pressure, and cholesterol levels.  Your activity level.  Other health conditions you have, such as heart or kidney disease. How do carbohydrates affect me? Carbohydrates, also called carbs, affect your blood glucose level more than any other type of food. Eating carbs naturally raises the amount of glucose in your blood. Carb counting is a method for keeping track of how many carbs you eat. Counting carbs is important to keep your blood glucose at a healthy level, especially if you use insulin or take certain oral diabetes medicines. It is important to know how many carbs you can safely have in each meal. This is different for every person. Your dietitian can help you calculate how many carbs you should have at each meal and for each snack. How does alcohol affect me? Alcohol can cause a sudden decrease in blood glucose (hypoglycemia), especially if you use insulin or take certain oral diabetes medicines. Hypoglycemia can be a life-threatening condition. Symptoms of hypoglycemia, such as sleepiness, dizziness, and confusion, are similar to symptoms of having too much  alcohol.  Do not drink alcohol if: ? Your health care provider tells you not to drink. ? You are pregnant, may be pregnant, or are planning to become pregnant.  If you drink alcohol: ? Do not drink on an empty stomach. ? Limit how much you use to:  0-1 drink a day for women.  0-2 drinks a day for men. ? Be aware of how much alcohol is in your drink. In the [place] one drink equals one 12 oz bottle of beer (355 mL), one 5 oz glass of wine (148 mL), or one 1 oz glass of hard liquor (44 mL). ? Keep yourself hydrated with water, diet soda, or unsweetened iced tea.  Keep in mind that regular soda, juice, and other mixers may contain a lot of sugar and must be counted as carbs. What are tips for following this plan? Reading food labels  Start by checking the serving size on the "Nutrition Facts" label of packaged foods and drinks. The amount of calories, carbs, fats, and other nutrients listed on the label is based on one serving of the item. Many items contain more than one serving per package.  Check the total grams (g) of carbs in one serving. You can calculate the number of servings of carbs in one serving by dividing the total carbs by 15. For example, if a food has 30 g of total carbs per serving, it would be equal to 2 servings of carbs.  Check the number of grams (g) of saturated fats and trans fats in one serving. Choose foods that have   a low amount or none of these fats.  Check the number of milligrams (mg) of salt (sodium) in one serving. Most people should limit total sodium intake to less than 2,300 mg per day.  Always check the nutrition information of foods labeled as "low-fat" or "nonfat." These foods may be higher in added sugar or refined carbs and should be avoided.  Talk to your dietitian to identify your daily goals for nutrients listed on the label. Shopping  Avoid buying canned, pre-made, or processed foods. These foods tend to be high in fat, sodium, and added  sugar.  Shop around the outside edge of the grocery store. This is where you will most often find fresh fruits and vegetables, bulk grains, fresh meats, and fresh dairy. Cooking  Use low-heat cooking methods, such as baking, instead of high-heat cooking methods like deep frying.  Cook using healthy oils, such as olive, canola, or sunflower oil.  Avoid cooking with butter, cream, or high-fat meats. Meal planning  Eat meals and snacks regularly, preferably at the same times every day. Avoid going long periods of time without eating.  Eat foods that are high in fiber, such as fresh fruits, vegetables, beans, and whole grains. Talk with your dietitian about how many servings of carbs you can eat at each meal.  Eat 4-6 oz (112-168 g) of lean protein each day, such as lean meat, chicken, fish, eggs, or tofu. One ounce (oz) of lean protein is equal to: ? 1 oz (28 g) of meat, chicken, or fish. ? 1 egg. ?  cup (62 g) of tofu.  Eat some foods each day that contain healthy fats, such as avocado, nuts, seeds, and fish.   What foods should I eat? Fruits Berries. Apples. Oranges. Peaches. Apricots. Plums. Grapes. Mango. Papaya. Pomegranate. Kiwi. Cherries. Vegetables Lettuce. Spinach. Leafy greens, including kale, chard, collard greens, and mustard greens. Beets. Cauliflower. Cabbage. Broccoli. Carrots. Green beans. Tomatoes. Peppers. Onions. Cucumbers. Brussels sprouts. Grains Whole grains, such as whole-wheat or whole-grain bread, crackers, tortillas, cereal, and pasta. Unsweetened oatmeal. Quinoa. Brown or wild rice. Meats and other proteins Seafood. Poultry without skin. Lean cuts of poultry and beef. Tofu. Nuts. Seeds. Dairy Low-fat or fat-free dairy products such as milk, yogurt, and cheese. The items listed above may not be a complete list of foods and beverages you can eat. Contact a dietitian for more information. What foods should I avoid? Fruits Fruits canned with  syrup. Vegetables Canned vegetables. Frozen vegetables with butter or cream sauce. Grains Refined white flour and flour products such as bread, pasta, snack foods, and cereals. Avoid all processed foods. Meats and other proteins Fatty cuts of meat. Poultry with skin. Breaded or fried meats. Processed meat. Avoid saturated fats. Dairy Full-fat yogurt, cheese, or milk. Beverages Sweetened drinks, such as soda or iced tea. The items listed above may not be a complete list of foods and beverages you should avoid. Contact a dietitian for more information. Questions to ask a health care provider  Do I need to meet with a diabetes educator?  Do I need to meet with a dietitian?  What number can I call if I have questions?  When are the best times to check my blood glucose? Where to find more information:  American Diabetes Association: diabetes.org  Academy of Nutrition and Dietetics: www.eatright.org  National Institute of Diabetes and Digestive and Kidney Diseases: www.niddk.nih.gov  Association of Diabetes Care and Education Specialists: www.diabeteseducator.org Summary  It is important to have healthy eating   habits because your blood sugar (glucose) levels are greatly affected by what you eat and drink.  A healthy meal plan will help you control your blood glucose and maintain a healthy lifestyle.  Your health care provider may recommend that you work with a dietitian to make a meal plan that is best for you.  Keep in mind that carbohydrates (carbs) and alcohol have immediate effects on your blood glucose levels. It is important to count carbs and to use alcohol carefully. This information is not intended to replace advice given to you by your health care provider. Make sure you discuss any questions you have with your health care provider. Document Revised: 01/23/2019 Document Reviewed: 01/23/2019 Elsevier Patient Education  2021 Elsevier Inc.  

## 2020-03-12 NOTE — Assessment & Plan Note (Signed)
Chronic, ongoing.  Continue current medication regimen and adjust as needed.  Lipid panel today, recent LDL < 70. 

## 2020-03-12 NOTE — Assessment & Plan Note (Signed)
Chronic, ongoing with A1C 6.5% today.  Urine ALB improved at 10 this visit and A:C <30.   Losartan for kidney protection.  BMP today.  Continue current medication regimen and adjust as needed.  Return in 3 months.  Avoid hypoglycemia due to advanced age.  Will refer him to nephrology if worsening kidney function presents.

## 2020-03-12 NOTE — Assessment & Plan Note (Signed)
As evidenced by scattered bruises bilaterall upper extremities.  Gentle skin cleansers at home and lotion.  Monitor for skin breakdown, report to provider if present. 

## 2020-03-13 LAB — LIPID PANEL W/O CHOL/HDL RATIO
Cholesterol, Total: 116 mg/dL (ref 100–199)
HDL: 51 mg/dL (ref >39)
LDL Chol Calc (NIH): 49 mg/dL (ref 0–99)
Triglycerides: 83 mg/dL (ref 0–149)
VLDL Cholesterol Cal: 16 mg/dL (ref 5–40)

## 2020-03-13 LAB — BASIC METABOLIC PANEL
BUN/Creatinine Ratio: 14 (ref 10–24)
BUN: 16 mg/dL (ref 8–27)
CO2: 28 mmol/L (ref 20–29)
Calcium: 9.2 mg/dL (ref 8.6–10.2)
Chloride: 102 mmol/L (ref 96–106)
Creatinine, Ser: 1.14 mg/dL (ref 0.76–1.27)
GFR calc Af Amer: 69 mL/min/{1.73_m2} (ref >59)
GFR calc non Af Amer: 60 mL/min/{1.73_m2} (ref >59)
Glucose: 124 mg/dL — ABNORMAL HIGH (ref 65–99)
Potassium: 4.3 mmol/L (ref 3.5–5.2)
Sodium: 144 mmol/L (ref 134–144)

## 2020-03-13 LAB — TSH: TSH: 4.24 u[IU]/mL (ref 0.450–4.500)

## 2020-03-13 NOTE — Progress Notes (Signed)
Good morning, please let John [name] know all of his labs returned and look fantastic with no concerns noted.  He continues to be amazing as always!!  Continue all current medications and have a fabulous day!! Keep being awesome!!  Thank you for allowing me to participate in your care. Kindest regards, Jaysiah Marchetta

## 2020-04-03 DIAGNOSIS — E785 Hyperlipidemia, unspecified: Secondary | ICD-10-CM | POA: Diagnosis not present

## 2020-04-03 DIAGNOSIS — Z952 Presence of prosthetic heart valve: Secondary | ICD-10-CM | POA: Diagnosis not present

## 2020-04-03 DIAGNOSIS — Z951 Presence of aortocoronary bypass graft: Secondary | ICD-10-CM | POA: Diagnosis not present

## 2020-05-21 DIAGNOSIS — I700 Atherosclerosis of aorta: Secondary | ICD-10-CM | POA: Insufficient documentation

## 2020-05-28 DIAGNOSIS — L570 Actinic keratosis: Secondary | ICD-10-CM | POA: Diagnosis not present

## 2020-05-28 DIAGNOSIS — Z85828 Personal history of other malignant neoplasm of skin: Secondary | ICD-10-CM | POA: Diagnosis not present

## 2020-05-28 DIAGNOSIS — Z872 Personal history of diseases of the skin and subcutaneous tissue: Secondary | ICD-10-CM | POA: Diagnosis not present

## 2020-05-28 DIAGNOSIS — Z859 Personal history of malignant neoplasm, unspecified: Secondary | ICD-10-CM | POA: Diagnosis not present

## 2020-06-10 ENCOUNTER — Ambulatory Visit: Payer: PPO | Admitting: Nurse Practitioner

## 2020-06-11 ENCOUNTER — Ambulatory Visit (INDEPENDENT_AMBULATORY_CARE_PROVIDER_SITE_OTHER): Payer: PPO | Admitting: Nurse Practitioner

## 2020-06-11 ENCOUNTER — Encounter: Payer: Self-pay | Admitting: Nurse Practitioner

## 2020-06-11 DIAGNOSIS — I130 Hypertensive heart and chronic kidney disease with heart failure and stage 1 through stage 4 chronic kidney disease, or unspecified chronic kidney disease: Secondary | ICD-10-CM | POA: Diagnosis not present

## 2020-06-11 DIAGNOSIS — I5022 Chronic systolic (congestive) heart failure: Secondary | ICD-10-CM | POA: Diagnosis not present

## 2020-06-11 DIAGNOSIS — D508 Other iron deficiency anemias: Secondary | ICD-10-CM

## 2020-06-11 DIAGNOSIS — E1169 Type 2 diabetes mellitus with other specified complication: Secondary | ICD-10-CM

## 2020-06-11 DIAGNOSIS — E785 Hyperlipidemia, unspecified: Secondary | ICD-10-CM | POA: Diagnosis not present

## 2020-06-11 DIAGNOSIS — N1831 Chronic kidney disease, stage 3a: Secondary | ICD-10-CM | POA: Diagnosis not present

## 2020-06-11 DIAGNOSIS — I700 Atherosclerosis of aorta: Secondary | ICD-10-CM | POA: Diagnosis not present

## 2020-06-11 DIAGNOSIS — C679 Malignant neoplasm of bladder, unspecified: Secondary | ICD-10-CM

## 2020-06-11 DIAGNOSIS — E1122 Type 2 diabetes mellitus with diabetic chronic kidney disease: Secondary | ICD-10-CM

## 2020-06-11 MED ORDER — FUROSEMIDE 40 MG PO TABS: ORAL_TABLET | ORAL | 4 refills | Status: DC

## 2020-06-11 NOTE — Progress Notes (Signed)
There were no vitals taken for this visit.   Subjective:    Patient ID: John [name], male    DOB: 02/04/1941, 80 y.o.   MRN: [id number]  HPI: John [name] is a 80 y.o. male  Chief Complaint  Patient presents with  . Diabetes    Patient denies having any problems or concerns at this time. Patient states he is doing well.   . Medication Refill    Patient states he has a prescription that he needs a refill on.     . This visit was completed via telephone due to the restrictions of the COVID-19 pandemic. All issues as above were discussed and addressed but no physical exam was performed. If it was felt that the patient should be evaluated in the office, they were directed there. The patient verbally consented to this visit. Patient was unable to complete an audio/visual visit due to Technical difficulties, Lack of internet. Due to the catastrophic nature of the COVID-19 pandemic, this visit was done through audio contact only. . Location of the patient: home . Location of the provider: home . Those involved with this call:  . Provider: Marnee Guarneri, DNP . CMA: Irena Reichmann, CMA . Front Desk/Registration: Jill Side  . Time spent on call: 21 minutes on the phone discussing health concerns. 15 minutes total spent in review of patient's record and preparation of their chart.  . I verified patient identity using two factors (patient name and date of birth). Patient consents verbally to being seen via telemedicine visit today.   DIABETES Continues on Janumet XR 50-1000. Last A1C was 6.5%.  Hypoglycemic episodes:no Polydipsia/polyuria:no Visual disturbance:no Chest pain:no Paresthesias:no Glucose Monitoring:no Accucheck frequency: Not Checking Fasting glucose: Post prandial: Evening: Before meals: Taking Insulin?:no Long acting insulin: Short acting insulin: Blood Pressure  Monitoring:rarely Retinal Examination:Not Up To Date Foot Exam:Up to Date Pneumovax:Up to Date Influenza:Up to Date Aspirin:yes  ANEMIA History of low iron and B12 levels.  Last labs iron 110, B12 1027, and H/H 12.8/40.2 in December -- improved.  He continues supplements daily. Anemia status: stable Etiology of anemia: unknown Duration of anemia treatment: months Compliance with treatment: good compliance Iron supplementation side effects: no Severity of anemia: mild Fatigue: no Decreased exercise tolerance: no  Dyspnea on exertion: no Palpitations: no Bleeding: no Pica: no  HYPERTENSION / HYPERLIPIDEMIA/HF Continues on Lasix and Metoprolol + Losartan + Lipitor, ASA. Last saw Dr. Josefa Half 04/03/20 with no medications changes. Has moderate underlying aortic stenosis noted on past imaging, which is monitored by cardiology.  Last echo 08/02/19 -- mild LV dysfunction with LVH, EF 45%.  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  Aortic atherosclerosis noted on imaging 02/08/18. Satisfied with current treatment?yes Duration of hypertension:chronic BP monitoring frequency:not checking BP range: BP medication side effects:no Duration of hyperlipidemia:chronic Cholesterol medication side effects:no Cholesterol supplements: none Medication compliance:good compliance Aspirin:yes Recent stressors:no Recurrent headaches:no Visual changes:no Palpitations:no Dyspnea:occasionally with lifting heavy items Chest pain:no Lower extremity edema:yes, at baseline Dizzy/lightheaded:no  BLADDER CANCER: Monitor by urology with last visit with Dr. Bernardo Heater on 08/27/19 and had recent cystoscopy on 12/28/19.  He does endorse that each time he has these procedures it takes longer to recover, so he is pondering whether to continue doing these as he reports pain lasts for 6 weeks afterwards.  Endorses being tired of all the procedures, ponders asking urology how long he  would survive without treatment.  Relevant past medical, surgical, family and social history  reviewed and updated as indicated. Interim medical history since our last visit reviewed. Allergies and medications reviewed and updated.  Review of Systems  Constitutional: Negative for activity change, diaphoresis, fatigue and fever.  Respiratory: Positive for shortness of breath (occasional with lifting). Negative for cough, chest tightness and wheezing.   Cardiovascular: Negative for chest pain, palpitations and leg swelling.  Gastrointestinal: Negative.   Endocrine: Negative for cold intolerance, heat intolerance, polydipsia, polyphagia and polyuria.  Neurological: Negative.   Psychiatric/Behavioral: Negative.     Per HPI unless specifically indicated above     Objective:    There were no vitals taken for this visit.  Wt Readings from Last 3 Encounters:  03/12/20 205 lb 9.6 oz (93.3 kg)  12/28/19 208 lb (94.3 kg)  12/10/19 208 lb (94.3 kg)    Physical Exam   Unable to perform due to telephone only.  Results for orders placed or performed in visit on 03/12/20  Bayer DCA Hb A1c Waived  Result Value Ref Range   HB A1C (BAYER DCA - WAIVED) 6.5 <8.1 %  Basic metabolic panel  Result Value Ref Range   Glucose 124 (H) 65 - 99 mg/dL   BUN 16 8 - 27 mg/dL   Creatinine, Ser 1.14 0.76 - 1.27 mg/dL   GFR calc non Af Amer 60 >59 mL/min/1.73   GFR calc Af Amer 69 >59 mL/min/1.73   BUN/Creatinine Ratio 14 10 - 24   Sodium 144 134 - 144 mmol/L   Potassium 4.3 3.5 - 5.2 mmol/L   Chloride 102 96 - 106 mmol/L   CO2 28 20 - 29 mmol/L   Calcium 9.2 8.6 - 10.2 mg/dL  Microalbumin, Urine Waived  Result Value Ref Range   Microalb, Ur Waived 10 0 - 19 mg/L   Creatinine, Urine Waived 100 10 - 300 mg/dL   Microalb/Creat Ratio <30 <30 mg/g  TSH  Result Value Ref Range   TSH 4.240 0.450 - 4.500 uIU/mL  Lipid Panel w/o Chol/HDL Ratio  Result Value Ref Range   Cholesterol, Total 116 100 - 199  mg/dL   Triglycerides 83 0 - 149 mg/dL   HDL 51 >39 mg/dL   VLDL Cholesterol Cal 16 5 - 40 mg/dL   LDL Chol Calc (NIH) 49 0 - 99 mg/dL      Assessment & Plan:   Problem List Items Addressed This Visit      Cardiovascular and Mediastinum   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 BP at goal for age on recent visits.  Avoid hypotension. Continue current medication regimen and adjust as needed.  Urine ALB improved at 10 last visit and A:C <30.  Losartan for kidney protection.  Refer to nephrology if worsening kidney function.  Recommend he monitor his BP regularly at home and follow DASH diet.  Continue to collaborate with cardiology.  Return in 3 months.      Relevant Medications   furosemide (LASIX) 40 MG tablet   Chronic systolic heart failure (HCC)    Chronic, ongoing.  Continue collaboration with cardiology and current medication regimen.  Recommend: - Reminded to call for an overnight weight gain of >2 pounds or a weekly weight weight of >5 pounds - not adding salt to his food and has been reading food labels. Reviewed the importance of keeping daily sodium intake to 2000mg  daily       Relevant Medications   furosemide (LASIX) 40 MG tablet   Aortic  atherosclerosis ([place])    Noted on imaging 02/08/18.  Continue daily medications for prevention.      Relevant Medications   furosemide (LASIX) 40 MG tablet     Endocrine   Hyperlipidemia associated with type 2 diabetes mellitus (HCC)    Chronic, ongoing.  Continue current medication regimen and adjust as needed.  Lipid panel up to date, recent LDL <70.      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  Urine ALB improved at 10 last visit and A:C <30.   Obtain A1c outpatient.  Losartan for kidney protection.  BMP today.  Continue current medication regimen and adjust as needed.  Return in 3 months.  Avoid hypoglycemia due  to advanced age.  Will refer him to nephrology if worsening kidney function presents.      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      CKD (chronic kidney disease) stage 3, GFR 30-59 ml/min (HCC)    Chronic, stable with recent labs showing improved CRT and GFR last visit.  Continue Losartan for kidney protection.  Refer to nephrology as needed.        Other   Iron deficiency anemia    Ongoing, stable.  Continue daily iron.  Recheck iron and B12 level next visit + CBC -- recent levels stable.         I discussed the assessment and treatment plan with the patient. The patient was provided an opportunity to ask questions and all were answered. The patient agreed with the plan and demonstrated an understanding of the instructions.   The patient was advised to call back or seek an in-person evaluation if the symptoms worsen or if the condition fails to improve as anticipated.   I provided 21+ minutes of time during this encounter.  Follow up plan: Return in about 3 months (around 09/10/2020) for T2DM, HTN/HLD, BLADDER CA, CKD.

## 2020-06-11 NOTE — Assessment & Plan Note (Signed)
Ongoing, stable.  Continue daily iron.  Recheck iron and B12 level next visit + CBC -- recent levels stable.

## 2020-06-11 NOTE — Assessment & Plan Note (Signed)
Chronic, ongoing.  Continue current medication regimen and adjust as needed.  Lipid panel up to date, recent LDL <70.

## 2020-06-11 NOTE — Assessment & Plan Note (Signed)
Continue to collaborate with urology, at length discussion on goals of care will continue at visits. 

## 2020-06-11 NOTE — Patient Instructions (Signed)
Diabetes Mellitus and Nutrition, Adult When you have diabetes, or diabetes mellitus, it is very important to have healthy eating habits because your blood sugar (glucose) levels are greatly affected by what you eat and drink. Eating healthy foods in the right amounts, at about the same times every day, can help you:  Control your blood glucose.  Lower your risk of heart disease.  Improve your blood pressure.  Reach or maintain a healthy weight. What can affect my meal plan? Every person with diabetes is different, and each person has different needs for a meal plan. Your health care provider may recommend that you work with a dietitian to make a meal plan that is best for you. Your meal plan may vary depending on factors such as:  The calories you need.  The medicines you take.  Your weight.  Your blood glucose, blood pressure, and cholesterol levels.  Your activity level.  Other health conditions you have, such as heart or kidney disease. How do carbohydrates affect me? Carbohydrates, also called carbs, affect your blood glucose level more than any other type of food. Eating carbs naturally raises the amount of glucose in your blood. Carb counting is a method for keeping track of how many carbs you eat. Counting carbs is important to keep your blood glucose at a healthy level, especially if you use insulin or take certain oral diabetes medicines. It is important to know how many carbs you can safely have in each meal. This is different for every person. Your dietitian can help you calculate how many carbs you should have at each meal and for each snack. How does alcohol affect me? Alcohol can cause a sudden decrease in blood glucose (hypoglycemia), especially if you use insulin or take certain oral diabetes medicines. Hypoglycemia can be a life-threatening condition. Symptoms of hypoglycemia, such as sleepiness, dizziness, and confusion, are similar to symptoms of having too much  alcohol.  Do not drink alcohol if: ? Your health care provider tells you not to drink. ? You are pregnant, may be pregnant, or are planning to become pregnant.  If you drink alcohol: ? Do not drink on an empty stomach. ? Limit how much you use to:  0-1 drink a day for women.  0-2 drinks a day for men. ? Be aware of how much alcohol is in your drink. In the [place] one drink equals one 12 oz bottle of beer (355 mL), one 5 oz glass of wine (148 mL), or one 1 oz glass of hard liquor (44 mL). ? Keep yourself hydrated with water, diet soda, or unsweetened iced tea.  Keep in mind that regular soda, juice, and other mixers may contain a lot of sugar and must be counted as carbs. What are tips for following this plan? Reading food labels  Start by checking the serving size on the "Nutrition Facts" label of packaged foods and drinks. The amount of calories, carbs, fats, and other nutrients listed on the label is based on one serving of the item. Many items contain more than one serving per package.  Check the total grams (g) of carbs in one serving. You can calculate the number of servings of carbs in one serving by dividing the total carbs by 15. For example, if a food has 30 g of total carbs per serving, it would be equal to 2 servings of carbs.  Check the number of grams (g) of saturated fats and trans fats in one serving. Choose foods that have   a low amount or none of these fats.  Check the number of milligrams (mg) of salt (sodium) in one serving. Most people should limit total sodium intake to less than 2,300 mg per day.  Always check the nutrition information of foods labeled as "low-fat" or "nonfat." These foods may be higher in added sugar or refined carbs and should be avoided.  Talk to your dietitian to identify your daily goals for nutrients listed on the label. Shopping  Avoid buying canned, pre-made, or processed foods. These foods tend to be high in fat, sodium, and added  sugar.  Shop around the outside edge of the grocery store. This is where you will most often find fresh fruits and vegetables, bulk grains, fresh meats, and fresh dairy. Cooking  Use low-heat cooking methods, such as baking, instead of high-heat cooking methods like deep frying.  Cook using healthy oils, such as olive, canola, or sunflower oil.  Avoid cooking with butter, cream, or high-fat meats. Meal planning  Eat meals and snacks regularly, preferably at the same times every day. Avoid going long periods of time without eating.  Eat foods that are high in fiber, such as fresh fruits, vegetables, beans, and whole grains. Talk with your dietitian about how many servings of carbs you can eat at each meal.  Eat 4-6 oz (112-168 g) of lean protein each day, such as lean meat, chicken, fish, eggs, or tofu. One ounce (oz) of lean protein is equal to: ? 1 oz (28 g) of meat, chicken, or fish. ? 1 egg. ?  cup (62 g) of tofu.  Eat some foods each day that contain healthy fats, such as avocado, nuts, seeds, and fish.   What foods should I eat? Fruits Berries. Apples. Oranges. Peaches. Apricots. Plums. Grapes. Mango. Papaya. Pomegranate. Kiwi. Cherries. Vegetables Lettuce. Spinach. Leafy greens, including kale, chard, collard greens, and mustard greens. Beets. Cauliflower. Cabbage. Broccoli. Carrots. Green beans. Tomatoes. Peppers. Onions. Cucumbers. Brussels sprouts. Grains Whole grains, such as whole-wheat or whole-grain bread, crackers, tortillas, cereal, and pasta. Unsweetened oatmeal. Quinoa. Brown or wild rice. Meats and other proteins Seafood. Poultry without skin. Lean cuts of poultry and beef. Tofu. Nuts. Seeds. Dairy Low-fat or fat-free dairy products such as milk, yogurt, and cheese. The items listed above may not be a complete list of foods and beverages you can eat. Contact a dietitian for more information. What foods should I avoid? Fruits Fruits canned with  syrup. Vegetables Canned vegetables. Frozen vegetables with butter or cream sauce. Grains Refined white flour and flour products such as bread, pasta, snack foods, and cereals. Avoid all processed foods. Meats and other proteins Fatty cuts of meat. Poultry with skin. Breaded or fried meats. Processed meat. Avoid saturated fats. Dairy Full-fat yogurt, cheese, or milk. Beverages Sweetened drinks, such as soda or iced tea. The items listed above may not be a complete list of foods and beverages you should avoid. Contact a dietitian for more information. Questions to ask a health care provider  Do I need to meet with a diabetes educator?  Do I need to meet with a dietitian?  What number can I call if I have questions?  When are the best times to check my blood glucose? Where to find more information:  American Diabetes Association: diabetes.org  Academy of Nutrition and Dietetics: www.eatright.org  National Institute of Diabetes and Digestive and Kidney Diseases: www.niddk.nih.gov  Association of Diabetes Care and Education Specialists: www.diabeteseducator.org Summary  It is important to have healthy eating   habits because your blood sugar (glucose) levels are greatly affected by what you eat and drink.  A healthy meal plan will help you control your blood glucose and maintain a healthy lifestyle.  Your health care provider may recommend that you work with a dietitian to make a meal plan that is best for you.  Keep in mind that carbohydrates (carbs) and alcohol have immediate effects on your blood glucose levels. It is important to count carbs and to use alcohol carefully. This information is not intended to replace advice given to you by your health care provider. Make sure you discuss any questions you have with your health care provider. Document Revised: 01/23/2019 Document Reviewed: 01/23/2019 Elsevier Patient Education  2021 Elsevier Inc.  

## 2020-06-11 NOTE — Assessment & Plan Note (Signed)
Noted on imaging 02/08/18.  Continue daily medications for prevention.

## 2020-06-11 NOTE — Assessment & Plan Note (Signed)
Chronic, ongoing with A1C 6.5% last visit.  Urine ALB improved at 10 last visit and A:C <30.   Obtain A1c outpatient.  Losartan for kidney protection.  BMP today.  Continue current medication regimen and adjust as needed.  Return in 3 months.  Avoid hypoglycemia due to advanced age.  Will refer him to nephrology if worsening kidney function presents.

## 2020-06-11 NOTE — Assessment & Plan Note (Signed)
Chronic, ongoing.  Continue collaboration with cardiology and current medication regimen.  Recommend: - Reminded to call for an overnight weight gain of >2 pounds or a weekly weight weight of >5 pounds - not adding salt to his food and has been reading food labels. Reviewed the importance of keeping daily sodium intake to 2000mg  daily

## 2020-06-11 NOTE — Assessment & Plan Note (Signed)
Chronic, stable with recent labs showing improved CRT and GFR last visit.  Continue Losartan for kidney protection.  Refer to nephrology as needed.

## 2020-06-11 NOTE — Assessment & Plan Note (Signed)
Chronic, ongoing. BP at goal for age on recent visits.  Avoid hypotension. Continue current medication regimen and adjust as needed.  Urine ALB improved at 10 last visit and A:C <30.  Losartan for kidney protection.  Refer to nephrology if worsening kidney function.  Recommend he monitor his BP regularly at home and follow DASH diet.  Continue to collaborate with cardiology.  Return in 3 months.

## 2020-06-12 ENCOUNTER — Other Ambulatory Visit: Payer: Self-pay

## 2020-06-12 ENCOUNTER — Other Ambulatory Visit: Payer: PPO

## 2020-06-12 DIAGNOSIS — N1831 Chronic kidney disease, stage 3a: Secondary | ICD-10-CM | POA: Diagnosis not present

## 2020-06-12 DIAGNOSIS — E1122 Type 2 diabetes mellitus with diabetic chronic kidney disease: Secondary | ICD-10-CM | POA: Diagnosis not present

## 2020-06-12 LAB — BAYER DCA HB A1C WAIVED: HB A1C (BAYER DCA - WAIVED): 7 % — ABNORMAL HIGH (ref <7.0)

## 2020-06-12 NOTE — Addendum Note (Signed)
Addended byRudie Meyer on: 06/12/2020 08:02 AM   Modules accepted: Orders

## 2020-06-12 NOTE — Progress Notes (Signed)
Good afternoon crew, please let John [name] know his A1c returned slightly more elevated then previous.  Currently 7%, was previously 6.5%.  At this time maintain current medications, but in future we may discussed adding on something like [place] or Vania Rea which are very heart beneficial and would help his diabetes.  I will discuss further next visit.  Have a great day and weekend!!  I missed seeing your smile this visit.:) Keep being awesome!!  Thank you for allowing me to participate in your care. Kindest regards, Chozen Latulippe

## 2020-06-19 ENCOUNTER — Encounter: Payer: Self-pay | Admitting: Nurse Practitioner

## 2020-06-19 DIAGNOSIS — H2513 Age-related nuclear cataract, bilateral: Secondary | ICD-10-CM | POA: Diagnosis not present

## 2020-06-19 LAB — HM DIABETES EYE EXAM

## 2020-06-27 ENCOUNTER — Other Ambulatory Visit: Payer: Self-pay | Admitting: Urology

## 2020-07-03 DIAGNOSIS — H2511 Age-related nuclear cataract, right eye: Secondary | ICD-10-CM | POA: Diagnosis not present

## 2020-07-11 ENCOUNTER — Encounter: Payer: Self-pay | Admitting: Ophthalmology

## 2020-07-17 NOTE — Discharge Instructions (Signed)

## 2020-07-21 ENCOUNTER — Encounter
Admission: RE | Disposition: A | Discharge status: Home / Self Care | Payer: Self-pay | Source: Home / Self Care | Attending: Ophthalmology

## 2020-07-21 ENCOUNTER — Other Ambulatory Visit: Payer: Self-pay

## 2020-07-21 ENCOUNTER — Encounter: Payer: Self-pay | Admitting: Ophthalmology

## 2020-07-21 ENCOUNTER — Ambulatory Visit: Payer: PPO | Admitting: Anesthesiology

## 2020-07-21 ENCOUNTER — Ambulatory Visit
Admission: RE | Admit: 2020-07-21 | Discharge: 2020-07-21 | Disposition: A | Discharge status: Home / Self Care | Payer: PPO | Attending: Ophthalmology | Admitting: Ophthalmology

## 2020-07-21 DIAGNOSIS — Z87891 Personal history of nicotine dependence: Secondary | ICD-10-CM | POA: Diagnosis not present

## 2020-07-21 DIAGNOSIS — Z952 Presence of prosthetic heart valve: Secondary | ICD-10-CM | POA: Insufficient documentation

## 2020-07-21 DIAGNOSIS — Z8551 Personal history of malignant neoplasm of bladder: Secondary | ICD-10-CM | POA: Insufficient documentation

## 2020-07-21 DIAGNOSIS — Z79899 Other long term (current) drug therapy: Secondary | ICD-10-CM | POA: Insufficient documentation

## 2020-07-21 DIAGNOSIS — H25811 Combined forms of age-related cataract, right eye: Secondary | ICD-10-CM | POA: Diagnosis not present

## 2020-07-21 DIAGNOSIS — Z96651 Presence of right artificial knee joint: Secondary | ICD-10-CM | POA: Insufficient documentation

## 2020-07-21 DIAGNOSIS — Z85828 Personal history of other malignant neoplasm of skin: Secondary | ICD-10-CM | POA: Insufficient documentation

## 2020-07-21 DIAGNOSIS — E1151 Type 2 diabetes mellitus with diabetic peripheral angiopathy without gangrene: Secondary | ICD-10-CM | POA: Diagnosis not present

## 2020-07-21 DIAGNOSIS — H2511 Age-related nuclear cataract, right eye: Secondary | ICD-10-CM | POA: Insufficient documentation

## 2020-07-21 DIAGNOSIS — Z7982 Long term (current) use of aspirin: Secondary | ICD-10-CM | POA: Diagnosis not present

## 2020-07-21 DIAGNOSIS — Z951 Presence of aortocoronary bypass graft: Secondary | ICD-10-CM | POA: Diagnosis not present

## 2020-07-21 DIAGNOSIS — E1136 Type 2 diabetes mellitus with diabetic cataract: Secondary | ICD-10-CM | POA: Insufficient documentation

## 2020-07-21 DIAGNOSIS — Z7984 Long term (current) use of oral hypoglycemic drugs: Secondary | ICD-10-CM | POA: Insufficient documentation

## 2020-07-21 DIAGNOSIS — Z96642 Presence of left artificial hip joint: Secondary | ICD-10-CM | POA: Diagnosis not present

## 2020-07-21 HISTORY — PX: CATARACT EXTRACTION W/PHACO: SHX586

## 2020-07-21 LAB — GLUCOSE, CAPILLARY: Glucose-Capillary: 145 mg/dL — ABNORMAL HIGH (ref 70–99)

## 2020-07-21 SURGERY — PHACOEMULSIFICATION, CATARACT, WITH IOL INSERTION
Anesthesia: Monitor Anesthesia Care | Site: Eye | Laterality: Right

## 2020-07-21 MED ORDER — LIDOCAINE HCL (PF) 2 % IJ SOLN
INTRAOCULAR | Status: DC | PRN
  Administered 2020-07-21: 1 mL via INTRAOCULAR

## 2020-07-21 MED ORDER — ONDANSETRON HCL 4 MG/2ML IJ SOLN: 4.0000 mg | Freq: Once | INTRAMUSCULAR | Status: DC | PRN

## 2020-07-21 MED ORDER — ARMC OPHTHALMIC DILATING DROPS
1.0000 "application " | OPHTHALMIC | Status: DC | PRN
  Administered 2020-07-21 (×3): 1 via OPHTHALMIC

## 2020-07-21 MED ORDER — ACETAMINOPHEN 325 MG PO TABS: 325.0000 mg | ORAL_TABLET | ORAL | Status: DC | PRN

## 2020-07-21 MED ORDER — FENTANYL CITRATE (PF) 100 MCG/2ML IJ SOLN
INTRAMUSCULAR | Status: DC | PRN
  Administered 2020-07-21: 50 ug via INTRAVENOUS

## 2020-07-21 MED ORDER — TETRACAINE HCL 0.5 % OP SOLN
1.0000 [drp] | OPHTHALMIC | Status: DC | PRN
  Administered 2020-07-21 (×3): 1 [drp] via OPHTHALMIC

## 2020-07-21 MED ORDER — SODIUM HYALURONATE 10 MG/ML IO SOLUTION
PREFILLED_SYRINGE | INTRAOCULAR | Status: DC | PRN
  Administered 2020-07-21: 0.55 mL via INTRAOCULAR

## 2020-07-21 MED ORDER — SODIUM HYALURONATE 23MG/ML IO SOSY
PREFILLED_SYRINGE | INTRAOCULAR | Status: DC | PRN
  Administered 2020-07-21: 0.6 mL via INTRAOCULAR

## 2020-07-21 MED ORDER — EPINEPHRINE PF 1 MG/ML IJ SOLN
INTRAOCULAR | Status: DC | PRN
  Administered 2020-07-21: 68 mL via OPHTHALMIC

## 2020-07-21 MED ORDER — ACETAMINOPHEN 160 MG/5ML PO SOLN: 325.0000 mg | ORAL | Status: DC | PRN

## 2020-07-21 SURGICAL SUPPLY — 17 items
CANNULA ANT/CHMB 27GA (MISCELLANEOUS) ×4 IMPLANT
DISSECTOR HYDRO NUCLEUS 50X22 (MISCELLANEOUS) ×2 IMPLANT
GLOVE PI ULTRA LF STRL 7.5 (GLOVE) ×1 IMPLANT
GLOVE PI ULTRA NON LATEX 7.5 (GLOVE) ×1
GLOVE SURG SYN 8.5  E (GLOVE) ×1
GLOVE SURG SYN 8.5 E (GLOVE) ×1 IMPLANT
GOWN STRL REUS W/ TWL LRG LVL3 (GOWN DISPOSABLE) ×2 IMPLANT
GOWN STRL REUS W/TWL LRG LVL3 (GOWN DISPOSABLE) ×4
LENS IOL TECNIS EYHANCE 25.0 (Intraocular Lens) ×2 IMPLANT
MARKER SKIN DUAL TIP RULER LAB (MISCELLANEOUS) ×2 IMPLANT
PACK DR. KING ARMS (PACKS) ×2 IMPLANT
PACK EYE AFTER SURG (MISCELLANEOUS) ×2 IMPLANT
PACK OPTHALMIC (MISCELLANEOUS) ×2 IMPLANT
SYR 3ML LL SCALE MARK (SYRINGE) ×2 IMPLANT
SYR TB 1ML LUER SLIP (SYRINGE) ×2 IMPLANT
WATER STERILE IRR 250ML POUR (IV SOLUTION) ×2 IMPLANT
WIPE NON LINTING 3.25X3.25 (MISCELLANEOUS) ×2 IMPLANT

## 2020-07-21 NOTE — Anesthesia Procedure Notes (Signed)
Procedure Name: MAC Date/Time: 07/21/2020 8:36 AM Performed by: Cameron Ali, CRNA Pre-anesthesia Checklist: Patient identified, Emergency Drugs available, Suction available, Timeout performed and Patient being monitored Patient Re-evaluated:Patient Re-evaluated prior to induction Oxygen Delivery Method: Nasal cannula Placement Confirmation: positive ETCO2

## 2020-07-21 NOTE — H&P (Signed)
[facility]   Primary Care Physician:  Venita Lick, NP Ophthalmologist: Dr. Benay Pillow  Pre-Procedure History & Physical: HPI:  John [name] is a 80 y.o. male here for cataract surgery.   Past Medical History:  Diagnosis Date  . Anemia   . Anginal pain ([place])   . Aortic stenosis   . Cancer ([place])    bladder  . Cataracts, bilateral   . CHF (congestive heart failure) ([place])   . Chronic kidney disease   . Coronary artery disease   . Diabetes mellitus without complication ([place])    takes Janumet daily...dx approx. 2009  . Dyspnea   . Dysrhythmia   . Heart murmur   . History of colon polyps    benign  . Hyperlipidemia    takes Lovastatin daily  . Hypertension    takes Hyzaar and Amlodipine daily  . Joint pain   . Medical history non-contributory   . Muscle cramps    occasionally in legs   . Osteoarthritis   . Pre-diabetes   . Senile purpura ([place]) 03/13/2018  . Skin cancer of face   . Urinary urgency     Past Surgical History:  Procedure Laterality Date  . AORTIC VALVE REPLACEMENT N/A 05/05/2017   Procedure: AORTIC VALVE REPLACEMENT (AVR) USING MAGNA EASE PERICARDIAL BIOPROSTHESIS - AORTIC VALVE MODEL 3300TFX, SIZE 23 MM;  Surgeon: Gaye Pollack, MD;  Location: [place];  Service: Open Heart Surgery;  Laterality: N/A;  . BLADDER SURGERY    . CARDIAC VALVE REPLACEMENT    . COLONOSCOPY    . CORONARY ARTERY BYPASS GRAFT N/A 05/05/2017   Procedure: CORONARY ARTERY BYPASS GRAFTING (CABG) x 4 WITH ENDOSCOPIC HARVESTING OF RIGHT SAPHENOUS VEIN;  Surgeon: Gaye Pollack, MD;  Location: [place];  Service: Open Heart Surgery;  Laterality: N/A;  . CYSTOURETHROSCOPY    . JOINT REPLACEMENT Left    hip  . JOINT REPLACEMENT Right    knee  . KNEE ARTHROPLASTY Right 01/12/2016   Procedure: RIGHT  TOTAL KNEE ARTHROPLASTY WITH COMPUTER NAVIGATION;  Surgeon: Rod Can, MD;  Location: [place];  Service: Orthopedics;  Laterality: Right;  Needs RNFA  . NO PAST SURGERIES    .  RIGHT/LEFT HEART CATH AND CORONARY ANGIOGRAPHY N/A 04/21/2017   Procedure: RIGHT/LEFT HEART CATH AND CORONARY ANGIOGRAPHY;  Surgeon: Isaias Cowman, MD;  Location: [place] [facility];  Service: Cardiovascular;  Laterality: N/A;  . SKIN SURGERY Left    arm  . TEE WITHOUT CARDIOVERSION N/A 05/05/2017   Procedure: TRANSESOPHAGEAL ECHOCARDIOGRAM (TEE);  Surgeon: Gaye Pollack, MD;  Location: [place];  Service: Open Heart Surgery;  Laterality: N/A;  . TRANSURETHRAL RESECTION OF BLADDER TUMOR N/A 05/15/2019   Procedure: TRANSURETHRAL RESECTION OF BLADDER TUMOR (TURBT);  Surgeon: Abbie Sons, MD;  Location: [facility];  Service: Urology;  Laterality: N/A;  . TRANSURETHRAL RESECTION OF BLADDER TUMOR WITH MITOMYCIN-C N/A 03/14/2018   Procedure: TRANSURETHRAL RESECTION OF BLADDER TUMOR WITH Gemcitabine;  Surgeon: Abbie Sons, MD;  Location: [facility];  Service: Urology;  Laterality: N/A;    Prior to Admission medications   Medication Sig Start Date End Date Taking? Authorizing Provider  Ascorbic Acid (VITAMIN C) 1000 MG tablet Take 1,000 mg by mouth daily.   Yes [provider]  aspirin 81 MG EC tablet Take 1 tablet (81 mg total) by mouth daily. 05/11/17  Yes Conte, Tessa N, PA-C  atorvastatin (LIPITOR) 40 MG tablet TAKE 1 TABLET BY MOUTH ONCE DAILY  AT 6 PM 03/12/20  Yes Cannady, Jolene T, NP  Cholecalciferol (VITAMIN D3) 50 MCG (2000 UT) TABS Take 2,000 Units by mouth daily.    Yes [provider]  Cyanocobalamin 1000 MCG TBCR Take 1,000 mcg by mouth daily.    Yes [provider]  furosemide (LASIX) 40 MG tablet TAKE ONE-HALF TABLET BY MOUTH ONCE DAILY 06/11/20  Yes Cannady, Jolene T, NP  HYDROcodone-acetaminophen (NORCO/VICODIN) 5-325 MG tablet Take 1 tablet by mouth every 6 (six) hours as needed for moderate pain. 05/15/19  Yes Stoioff, Ronda Fairly, MD  Iron, Ferrous Sulfate, 142 (45 Fe) MG TBCR Take 45 mg by mouth daily.   Yes [provider]  KLOR-CON M10 10 MEQ  tablet Take 1 tablet (10 mEq total) by mouth daily. 03/12/20  Yes Cannady, Jolene T, NP  losartan (COZAAR) 25 MG tablet Take 1/2 (one-half) tablet by mouth once daily 03/12/20  Yes Cannady, Jolene T, NP  metoprolol tartrate (LOPRESSOR) 25 MG tablet Take 1 tablet (25 mg total) by mouth 2 (two) times daily. 03/12/20  Yes Cannady, Jolene T, NP  naproxen sodium (ALEVE) 220 MG tablet Take 220 mg by mouth daily as needed (pain).    Yes [provider]  SitaGLIPtin-MetFORMIN HCl (JANUMET XR) 50-1000 MG TB24 Take 1 tablet by mouth 2 (two) times daily. 03/12/20  Yes Marnee Guarneri T, NP    Allergies as of 06/20/2020  . (No Known Allergies)    Family History  Problem Relation Age of Onset  . Dementia Mother   . Stroke Mother   . Stroke Father   . Diabetes Father   . Heart disease Father   . Cancer Sister   . Hypertension Brother   . Cancer Sister     Social History   Socioeconomic History  . Marital status: Married    Spouse name: Not on file  . Number of children: Not on file  . Years of education: Not on file  . Highest education level: 10th grade  Occupational History  . Occupation: retired  Tobacco Use  . Smoking status: Former Smoker    Years: 25.00    Quit date: 12/15/1980    Years since quitting: 39.6  . Smokeless tobacco: Never Used  Vaping Use  . Vaping Use: Never used  Substance and Sexual Activity  . Alcohol use: No    Alcohol/week: 0.0 standard drinks  . Drug use: No  . Sexual activity: Yes  Other Topics Concern  . Not on file  Social History Narrative  . Not on file   Social Determinants of Health   Financial Resource Strain: Low Risk   . Difficulty of Paying Living Expenses: Not hard at all  Food Insecurity: No Food Insecurity  . Worried About Charity fundraiser in the Last Year: Never true  . Ran Out of Food in the Last Year: Never true  Transportation Needs: No Transportation Needs  . Lack of Transportation (Medical): No  . Lack of  Transportation (Non-Medical): No  Physical Activity: Inactive  . Days of Exercise per Week: 0 days  . Minutes of Exercise per Session: 0 min  Stress: No Stress Concern Present  . Feeling of Stress : Not at all  Social Connections: Not on file  Intimate Partner Violence: Not on file    Review of Systems: See HPI, otherwise negative ROS  Physical Exam: BP (!) 170/52   Pulse (!) 52   Temp (!) 97.1 F (36.2 C) (Temporal)   Resp  18   Ht 6' (1.829 m)   Wt 93.4 kg   SpO2 95%   BMI 27.94 kg/m  General:   Alert,  pleasant and cooperative in NAD Head:  Normocephalic and atraumatic. Respiratory:  Normal work of breathing. Cardiovascular:  RRR  Impression/Plan: [name] is here for cataract surgery.  Risks, benefits, limitations, and alternatives regarding cataract surgery have been reviewed with the patient.  Questions have been answered.  All parties agreeable.   Benay Pillow, MD  07/21/2020, 8:30 AM

## 2020-07-21 NOTE — Op Note (Signed)
OPERATIVE NOTE  John [name] [id number] 07/21/2020   PREOPERATIVE DIAGNOSIS:  Nuclear sclerotic cataract right eye.  H25.11   POSTOPERATIVE DIAGNOSIS:    Nuclear sclerotic cataract right eye.     PROCEDURE:  Phacoemusification with posterior chamber intraocular lens placement of the right eye   LENS:   Implant Name Type Inv. Item Serial No. Manufacturer Lot No. LRB No. Used Action  LENS IOL TECNIS EYHANCE 25.0 - [id number] Intraocular Lens LENS IOL TECNIS EYHANCE 25.0 [id number] [id number]   Right 1 Implanted       Procedure(s): CATARACT EXTRACTION PHACO AND INTRAOCULAR LENS PLACEMENT (IOC) RIGHT DIABETIC 9.09 00:51.3 (Right)  DIB00 +25.0   ULTRASOUND TIME: 0 minutes 51 seconds.  CDE 9.09   SURGEON:  Benay Pillow, MD, MPH  ANESTHESIOLOGIST: Anesthesiologist: Veda Canning, MD CRNA: Cameron Ali, CRNA   ANESTHESIA:  Topical with tetracaine drops augmented with 1% preservative-free intracameral lidocaine.  ESTIMATED BLOOD LOSS: less than 1 mL.   COMPLICATIONS:  None.   DESCRIPTION OF PROCEDURE:  The patient was identified in the holding room and transported to the operating room and placed in the supine position under the operating microscope.  The right eye was identified as the operative eye and it was prepped and draped in the usual sterile ophthalmic fashion.   A 1.0 millimeter clear-corneal paracentesis was made at the 10:30 position. 0.5 ml of preservative-free 1% lidocaine with epinephrine was injected into the anterior chamber.  The anterior chamber was filled with Healon 5 viscoelastic.  A 2.4 millimeter keratome was used to make a near-clear corneal incision at the 8:00 position.  A curvilinear capsulorrhexis was made with a cystotome and capsulorrhexis forceps.  Balanced salt solution was used to hydrodissect and hydrodelineate the nucleus.   Phacoemulsification was then used in stop and chop fashion to remove the lens nucleus and epinucleus.  The remaining cortex was  then removed using the irrigation and aspiration handpiece. Healon was then placed into the capsular bag to distend it for lens placement.  A lens was then injected into the capsular bag.  The remaining viscoelastic was aspirated.   Wounds were hydrated with balanced salt solution.  The anterior chamber was inflated to a physiologic pressure with balanced salt solution.   Intracameral vigamox 0.1 mL undiluted was injected into the eye and a drop placed onto the ocular surface.  No wound leaks were noted.  The patient was taken to the recovery room in stable condition without complications of anesthesia or surgery  Benay Pillow 07/21/2020, 8:57 AM

## 2020-07-21 NOTE — Anesthesia Postprocedure Evaluation (Signed)
Anesthesia Post Note  Patient: John [name]  Procedure(s) Performed: CATARACT EXTRACTION PHACO AND INTRAOCULAR LENS PLACEMENT (IOC) RIGHT DIABETIC 9.09 00:51.3 (Right Eye)     Patient location during evaluation: PACU Anesthesia Type: MAC Level of consciousness: awake Pain management: pain level controlled Vital Signs Assessment: post-procedure vital signs reviewed and stable Respiratory status: respiratory function stable Cardiovascular status: stable Postop Assessment: no apparent nausea or vomiting Anesthetic complications: no   No complications documented.  Veda Canning

## 2020-07-21 NOTE — Anesthesia Preprocedure Evaluation (Signed)
Anesthesia Evaluation  Patient identified by MRN, date of birth, ID band Patient awake    Reviewed: Allergy & Precautions, NPO status   Airway Mallampati: II  TM Distance: >3 FB     Dental   Pulmonary former smoker,    Pulmonary exam normal        Cardiovascular hypertension, + CAD, + CABG (2019), + Peripheral Vascular Disease and +CHF  + Valvular Problems/Murmurs (s/p AVR 2019)  Rhythm:Regular Rate:Normal  HLD  TTE 02/08/2018   Mildly depressed LVF  Mild global Hypo  EF=40-45%  Normal Right side  Last cardiology visit 04/03/2020   Neuro/Psych    GI/Hepatic   Endo/Other  diabetes, Type 2  Renal/GU Renal InsufficiencyRenal disease     Musculoskeletal  (+) Arthritis ,   Abdominal   Peds  Hematology  (+) anemia ,   Anesthesia Other Findings   Reproductive/Obstetrics                             Anesthesia Physical Anesthesia Plan  ASA: III  Anesthesia Plan: MAC   Post-op Pain Management:    Induction: Intravenous  PONV Risk Score and Plan: TIVA, Midazolam and Treatment may vary due to age or medical condition  Airway Management Planned: Natural Airway and Nasal Cannula  Additional Equipment:   Intra-op Plan:   Post-operative Plan:   Informed Consent: I have reviewed the patients History and Physical, chart, labs and discussed the procedure including the risks, benefits and alternatives for the proposed anesthesia with the patient or authorized representative who has indicated his/her understanding and acceptance.       Plan Discussed with: CRNA  Anesthesia Plan Comments:         Anesthesia Quick Evaluation

## 2020-07-21 NOTE — Transfer of Care (Signed)
Immediate Anesthesia Transfer of Care Note  Patient: John [name]  Procedure(s) Performed: CATARACT EXTRACTION PHACO AND INTRAOCULAR LENS PLACEMENT (IOC) RIGHT DIABETIC 9.09 00:51.3 (Right Eye)  Patient Location: PACU  Anesthesia Type: MAC  Level of Consciousness: awake, alert  and patient cooperative  Airway and Oxygen Therapy: Patient Spontanous Breathing and Patient connected to supplemental oxygen  Post-op Assessment: Post-op Vital signs reviewed, Patient's Cardiovascular Status Stable, Respiratory Function Stable, Patent Airway and No signs of Nausea or vomiting  Post-op Vital Signs: Reviewed and stable  Complications: No complications documented.

## 2020-07-22 ENCOUNTER — Encounter: Payer: Self-pay | Admitting: Ophthalmology

## 2020-07-29 NOTE — Anesthesia Preprocedure Evaluation (Addendum)
Anesthesia Evaluation  Patient identified by MRN, date of birth, ID band Patient awake    Reviewed: Allergy & Precautions, H&P , NPO status , Patient's Chart, lab work & pertinent test results  Airway Mallampati: II  TM Distance: >3 FB     Dental   Pulmonary shortness of breath, former smoker,    Pulmonary exam normal        Cardiovascular hypertension, + angina + CAD, + Peripheral Vascular Disease and +CHF  + dysrhythmias  Rhythm:Regular Rate:Normal  Low normal EF in 2019 (40-45%)   Neuro/Psych    GI/Hepatic   Endo/Other  diabetes  Renal/GU      Musculoskeletal   Abdominal   Peds  Hematology   Anesthesia Other Findings   Reproductive/Obstetrics negative OB ROS                            Anesthesia Physical Anesthesia Plan  ASA: 3  Anesthesia Plan: MAC   Post-op Pain Management:    Induction: Intravenous  PONV Risk Score and Plan: TIVA, Midazolam and Treatment may vary due to age or medical condition  Airway Management Planned: Natural Airway and Nasal Cannula  Additional Equipment:   Intra-op Plan:   Post-operative Plan:   Informed Consent: I have reviewed the patients History and Physical, chart, labs and discussed the procedure including the risks, benefits and alternatives for the proposed anesthesia with the patient or authorized representative who has indicated his/her understanding and acceptance.       Plan Discussed with: CRNA  Anesthesia Plan Comments:         Anesthesia Quick Evaluation

## 2020-07-30 ENCOUNTER — Other Ambulatory Visit: Payer: Self-pay

## 2020-07-30 ENCOUNTER — Ambulatory Visit: Payer: PPO | Admitting: Urology

## 2020-07-30 ENCOUNTER — Encounter: Payer: Self-pay | Admitting: Urology

## 2020-07-30 VITALS — BP 184/76 | HR 81 | Ht 72.0 in | Wt 208.0 lb

## 2020-07-30 DIAGNOSIS — D494 Neoplasm of unspecified behavior of bladder: Secondary | ICD-10-CM

## 2020-07-30 NOTE — Progress Notes (Signed)
   07/30/20  CC:  Chief Complaint  Patient presents with  . Cysto    Urologic history: 03/2013: distal right ureterectomy and reimplant for low grade UTUC 03/2014: TURBT, LGTa 04/2016: TURBT, PUNLMP 03/2018: TURBT,LGTa; post resection gemcitabine 04/2019: TURBT, LGTa; recommended 6-week course of intravesical chemotherapy however patient declined  HPI: John [name] presents for surveillance cystoscopy.  He has no complaints.  Denies recurrent gross hematuria.  Blood pressure (!) 184/76, pulse 81, height 6' (1.829 m), weight 208 lb (94.3 kg). NED. A&Ox3.   No respiratory distress   Abd soft, NT, ND Normal phallus with bilateral descended testicles  Cystoscopy Procedure Note  Patient identification was confirmed, informed consent was obtained, and patient was prepped using Betadine solution.  Lidocaine jelly was administered per urethral meatus.     Pre-Procedure: - Inspection reveals a normal caliber urethral meatus.  Procedure: The flexible cystoscope was introduced without difficulty - Wide caliber bulbar stricture - Moderate lateral lobe enlargement prostate  - Mild elevation bladder neck - Bilateral ureteral orifices identified - Bladder mucosa  reveals broad area of mucosa with papillary change posterior wall - No bladder stones - No trabeculation  Retroflexion shows no abnormalities   Post-Procedure: - Patient tolerated the procedure well  Assessment/ Plan:  Recurrent bladder tumor  Findings were discussed in detail with John [name] and have recommended scheduling TURBT  Potential risks discussed including bleeding, infection, urethral stricture and bladder injury/perforation.  All questions were answered and he desires to proceed.   Abbie Sons, MD

## 2020-07-31 DIAGNOSIS — H2512 Age-related nuclear cataract, left eye: Secondary | ICD-10-CM | POA: Diagnosis not present

## 2020-07-31 LAB — URINALYSIS, COMPLETE
Bilirubin, UA: NEGATIVE
Glucose, UA: NEGATIVE
Ketones, UA: NEGATIVE
Leukocytes,UA: NEGATIVE
Nitrite, UA: NEGATIVE
Protein,UA: NEGATIVE
RBC, UA: NEGATIVE
Specific Gravity, UA: 1.025 (ref 1.005–1.030)
Urobilinogen, Ur: 0.2 mg/dL (ref 0.2–1.0)
pH, UA: 5.5 (ref 5.0–7.5)

## 2020-07-31 LAB — MICROSCOPIC EXAMINATION
Bacteria, UA: NONE SEEN
RBC, Urine: NONE SEEN /hpf (ref 0–2)

## 2020-08-11 ENCOUNTER — Encounter: Payer: Self-pay | Admitting: Ophthalmology

## 2020-08-11 ENCOUNTER — Ambulatory Visit: Payer: PPO | Admitting: Anesthesiology

## 2020-08-11 ENCOUNTER — Ambulatory Visit
Admission: RE | Admit: 2020-08-11 | Discharge: 2020-08-11 | Disposition: A | Discharge status: Home / Self Care | Payer: PPO | Attending: Ophthalmology | Admitting: Ophthalmology

## 2020-08-11 ENCOUNTER — Encounter
Admission: RE | Disposition: A | Discharge status: Home / Self Care | Payer: Self-pay | Source: Home / Self Care | Attending: Ophthalmology

## 2020-08-11 ENCOUNTER — Other Ambulatory Visit: Payer: Self-pay

## 2020-08-11 DIAGNOSIS — I350 Nonrheumatic aortic (valve) stenosis: Secondary | ICD-10-CM | POA: Diagnosis not present

## 2020-08-11 DIAGNOSIS — H2512 Age-related nuclear cataract, left eye: Secondary | ICD-10-CM | POA: Insufficient documentation

## 2020-08-11 DIAGNOSIS — Z951 Presence of aortocoronary bypass graft: Secondary | ICD-10-CM | POA: Diagnosis not present

## 2020-08-11 DIAGNOSIS — Z7984 Long term (current) use of oral hypoglycemic drugs: Secondary | ICD-10-CM | POA: Diagnosis not present

## 2020-08-11 DIAGNOSIS — N189 Chronic kidney disease, unspecified: Secondary | ICD-10-CM | POA: Diagnosis not present

## 2020-08-11 DIAGNOSIS — I509 Heart failure, unspecified: Secondary | ICD-10-CM | POA: Diagnosis not present

## 2020-08-11 DIAGNOSIS — Z833 Family history of diabetes mellitus: Secondary | ICD-10-CM | POA: Diagnosis not present

## 2020-08-11 DIAGNOSIS — E1136 Type 2 diabetes mellitus with diabetic cataract: Secondary | ICD-10-CM | POA: Insufficient documentation

## 2020-08-11 DIAGNOSIS — Z823 Family history of stroke: Secondary | ICD-10-CM | POA: Insufficient documentation

## 2020-08-11 DIAGNOSIS — Z79899 Other long term (current) drug therapy: Secondary | ICD-10-CM | POA: Insufficient documentation

## 2020-08-11 DIAGNOSIS — E1122 Type 2 diabetes mellitus with diabetic chronic kidney disease: Secondary | ICD-10-CM | POA: Diagnosis not present

## 2020-08-11 DIAGNOSIS — Z7982 Long term (current) use of aspirin: Secondary | ICD-10-CM | POA: Diagnosis not present

## 2020-08-11 DIAGNOSIS — Z809 Family history of malignant neoplasm, unspecified: Secondary | ICD-10-CM | POA: Diagnosis not present

## 2020-08-11 DIAGNOSIS — Z87891 Personal history of nicotine dependence: Secondary | ICD-10-CM | POA: Insufficient documentation

## 2020-08-11 DIAGNOSIS — I130 Hypertensive heart and chronic kidney disease with heart failure and stage 1 through stage 4 chronic kidney disease, or unspecified chronic kidney disease: Secondary | ICD-10-CM | POA: Insufficient documentation

## 2020-08-11 DIAGNOSIS — Z952 Presence of prosthetic heart valve: Secondary | ICD-10-CM | POA: Insufficient documentation

## 2020-08-11 HISTORY — PX: CATARACT EXTRACTION W/PHACO: SHX586

## 2020-08-11 LAB — GLUCOSE, CAPILLARY
Glucose-Capillary: 136 mg/dL — ABNORMAL HIGH (ref 70–99)
Glucose-Capillary: 126 mg/dL — ABNORMAL HIGH (ref 70–99)

## 2020-08-11 SURGERY — PHACOEMULSIFICATION, CATARACT, WITH IOL INSERTION
Anesthesia: Monitor Anesthesia Care | Site: Eye | Laterality: Left

## 2020-08-11 MED ORDER — ARMC OPHTHALMIC DILATING DROPS
1.0000 "application " | OPHTHALMIC | Status: DC | PRN
  Administered 2020-08-11 (×3): 1 via OPHTHALMIC

## 2020-08-11 MED ORDER — ACETAMINOPHEN 325 MG PO TABS: 325.0000 mg | ORAL_TABLET | ORAL | Status: DC | PRN

## 2020-08-11 MED ORDER — EPINEPHRINE PF 1 MG/ML IJ SOLN
INTRAOCULAR | Status: DC | PRN
  Administered 2020-08-11: 88 mL via OPHTHALMIC

## 2020-08-11 MED ORDER — MIDAZOLAM HCL 2 MG/2ML IJ SOLN
INTRAMUSCULAR | Status: DC | PRN
  Administered 2020-08-11: .5 mg via INTRAVENOUS

## 2020-08-11 MED ORDER — SODIUM HYALURONATE 23MG/ML IO SOSY
PREFILLED_SYRINGE | INTRAOCULAR | Status: DC | PRN
  Administered 2020-08-11: 0.6 mL via INTRAOCULAR

## 2020-08-11 MED ORDER — ONDANSETRON HCL 4 MG/2ML IJ SOLN: 4.0000 mg | Freq: Once | INTRAMUSCULAR | Status: DC | PRN

## 2020-08-11 MED ORDER — FENTANYL CITRATE (PF) 100 MCG/2ML IJ SOLN
INTRAMUSCULAR | Status: DC | PRN
  Administered 2020-08-11: 50 ug via INTRAVENOUS

## 2020-08-11 MED ORDER — TETRACAINE HCL 0.5 % OP SOLN
1.0000 [drp] | OPHTHALMIC | Status: DC | PRN
  Administered 2020-08-11 (×3): 1 [drp] via OPHTHALMIC

## 2020-08-11 MED ORDER — MOXIFLOXACIN HCL 0.5 % OP SOLN
OPHTHALMIC | Status: DC | PRN
  Administered 2020-08-11: 0.2 mL via OPHTHALMIC

## 2020-08-11 MED ORDER — LIDOCAINE HCL (PF) 2 % IJ SOLN
INTRAOCULAR | Status: DC | PRN
  Administered 2020-08-11: 1 mL via INTRAOCULAR

## 2020-08-11 MED ORDER — SODIUM HYALURONATE 10 MG/ML IO SOLUTION
PREFILLED_SYRINGE | INTRAOCULAR | Status: DC | PRN
  Administered 2020-08-11: 0.55 mL via INTRAOCULAR

## 2020-08-11 MED ORDER — ACETAMINOPHEN 160 MG/5ML PO SOLN: 325.0000 mg | ORAL | Status: DC | PRN

## 2020-08-11 SURGICAL SUPPLY — 16 items
CANNULA ANT/CHMB 27GA (MISCELLANEOUS) ×6 IMPLANT
DISSECTOR HYDRO NUCLEUS 50X22 (MISCELLANEOUS) ×3 IMPLANT
GLOVE ORTHO TXT STRL SZ7.5 (GLOVE) ×3 IMPLANT
GLOVE SURG SYN 8.5  E (GLOVE) ×2
GLOVE SURG SYN 8.5 E (GLOVE) ×1 IMPLANT
GOWN STRL REUS W/ TWL LRG LVL3 (GOWN DISPOSABLE) ×2 IMPLANT
GOWN STRL REUS W/TWL LRG LVL3 (GOWN DISPOSABLE) ×6
LENS IOL TECNIS EYHANCE 24.0 (Intraocular Lens) ×3 IMPLANT
MARKER SKIN DUAL TIP RULER LAB (MISCELLANEOUS) ×3 IMPLANT
PACK DR. KING ARMS (PACKS) ×3 IMPLANT
PACK EYE AFTER SURG (MISCELLANEOUS) ×3 IMPLANT
PACK OPTHALMIC (MISCELLANEOUS) ×3 IMPLANT
SYR 3ML LL SCALE MARK (SYRINGE) ×3 IMPLANT
SYR TB 1ML LUER SLIP (SYRINGE) ×3 IMPLANT
WATER STERILE IRR 250ML POUR (IV SOLUTION) ×3 IMPLANT
WIPE NON LINTING 3.25X3.25 (MISCELLANEOUS) ×3 IMPLANT

## 2020-08-11 NOTE — Op Note (Signed)
OPERATIVE NOTE  JACQUES FIFE [id number] 08/11/2020   PREOPERATIVE DIAGNOSIS:  Nuclear sclerotic cataract left eye.  H25.12   POSTOPERATIVE DIAGNOSIS:    Nuclear sclerotic cataract left eye.     PROCEDURE:  Phacoemusification with posterior chamber intraocular lens placement of the left eye   LENS:   Implant Name Type Inv. Item Serial No. Manufacturer Lot No. LRB No. Used Action  LENS IOL TECNIS EYHANCE 24.0 - [id number] Intraocular Lens LENS IOL TECNIS EYHANCE 24.0 [id number] JOHNSON   Left 1 Implanted      Procedure(s) with comments: CATARACT EXTRACTION PHACO AND INTRAOCULAR LENS PLACEMENT (IOC) LEFT DIABETIC (Left) - 5.08 0:37.2  DIB00 +24.0  SURGEON:  Benay Pillow, MD, MPH   ANESTHESIA:  Topical with tetracaine drops augmented with 1% preservative-free intracameral lidocaine.  ESTIMATED BLOOD LOSS: <1 mL   COMPLICATIONS:  None.   DESCRIPTION OF PROCEDURE:  The patient was identified in the holding room and transported to the operating room and placed in the supine position under the operating microscope.  The left eye was identified as the operative eye and it was prepped and draped in the usual sterile ophthalmic fashion.   A 1.0 millimeter clear-corneal paracentesis was made at the 5:00 position. 0.5 ml of preservative-free 1% lidocaine with epinephrine was injected into the anterior chamber.  The anterior chamber was filled with Healon 5 viscoelastic.  A 2.4 millimeter keratome was used to make a near-clear corneal incision at the 2:00 position.  A curvilinear capsulorrhexis was made with a cystotome and capsulorrhexis forceps.  Balanced salt solution was used to hydrodissect and hydrodelineate the nucleus.   Phacoemulsification was then used in stop and chop fashion to remove the lens nucleus and epinucleus.  The remaining cortex was then removed using the irrigation and aspiration handpiece. Healon was then placed into the capsular bag to distend it for lens placement.   A lens was then injected into the capsular bag.  The remaining viscoelastic was aspirated.   Wounds were hydrated with balanced salt solution.  The anterior chamber was inflated to a physiologic pressure with balanced salt solution.  Intracameral vigamox 0.1 mL undiltued was injected into the eye and a drop placed onto the ocular surface.  No wound leaks were noted.  The patient was taken to the recovery room in stable condition without complications of anesthesia or surgery  Benay Pillow 08/11/2020, 11:28 AM

## 2020-08-11 NOTE — Transfer of Care (Signed)
Immediate Anesthesia Transfer of Care Note  Patient: John [name]  Procedure(s) Performed: CATARACT EXTRACTION PHACO AND INTRAOCULAR LENS PLACEMENT (IOC) LEFT DIABETIC (Left: Eye)  Patient Location: PACU  Anesthesia Type: MAC  Level of Consciousness: awake, alert  and patient cooperative  Airway and Oxygen Therapy: Patient Spontanous Breathing and Patient connected to supplemental oxygen  Post-op Assessment: Post-op Vital signs reviewed, Patient's Cardiovascular Status Stable, Respiratory Function Stable, Patent Airway and No signs of Nausea or vomiting  Post-op Vital Signs: Reviewed and stable  Complications: No notable events documented.

## 2020-08-11 NOTE — Anesthesia Postprocedure Evaluation (Signed)
Anesthesia Post Note  Patient: John [name]  Procedure(s) Performed: CATARACT EXTRACTION PHACO AND INTRAOCULAR LENS PLACEMENT (IOC) LEFT DIABETIC (Left: Eye)     Patient location during evaluation: PACU Anesthesia Type: MAC Level of consciousness: awake Pain management: pain level controlled Vital Signs Assessment: post-procedure vital signs reviewed and stable Respiratory status: respiratory function stable Cardiovascular status: stable Postop Assessment: no apparent nausea or vomiting Anesthetic complications: no   No notable events documented.  Veda Canning

## 2020-08-11 NOTE — H&P (Signed)
[facility]   Primary Care Physician:  Venita Lick, NP Ophthalmologist: Dr. Benay Pillow  Pre-Procedure History & Physical: HPI:  John [name] is a 80 y.o. male here for cataract surgery.   Past Medical History:  Diagnosis Date   Anemia    Anginal pain (HCC)    Aortic stenosis    Cancer (HCC)    bladder   Cataracts, bilateral    CHF (congestive heart failure) (HCC)    Chronic kidney disease    Coronary artery disease    Diabetes mellitus without complication ([place])    takes Janumet daily...dx approx. 2009   Dyspnea    Dysrhythmia    Heart murmur    History of colon polyps    benign   Hyperlipidemia    takes Lovastatin daily   Hypertension    takes Hyzaar and Amlodipine daily   Joint pain    Medical history non-contributory    Muscle cramps    occasionally in legs    Osteoarthritis    Pre-diabetes    Senile purpura ([place]) 03/13/2018   Skin cancer of face    Urinary urgency     Past Surgical History:  Procedure Laterality Date   AORTIC VALVE REPLACEMENT N/A 05/05/2017   Procedure: AORTIC VALVE REPLACEMENT (AVR) USING MAGNA EASE PERICARDIAL BIOPROSTHESIS - AORTIC VALVE MODEL 3300TFX, SIZE 23 MM;  Surgeon: Gaye Pollack, MD;  Location: [place];  Service: Open Heart Surgery;  Laterality: N/A;   BLADDER SURGERY     CARDIAC VALVE REPLACEMENT     CATARACT EXTRACTION W/PHACO Right 07/21/2020   Procedure: CATARACT EXTRACTION PHACO AND INTRAOCULAR LENS PLACEMENT (IOC) RIGHT DIABETIC 9.09 00:51.3;  Surgeon: Eulogio Bear, MD;  Location: [place];  Service: Ophthalmology;  Laterality: Right;   COLONOSCOPY     CORONARY ARTERY BYPASS GRAFT N/A 05/05/2017   Procedure: CORONARY ARTERY BYPASS GRAFTING (CABG) x 4 WITH ENDOSCOPIC HARVESTING OF RIGHT SAPHENOUS VEIN;  Surgeon: Gaye Pollack, MD;  Location: [place];  Service: Open Heart Surgery;  Laterality: N/A;   CYSTOURETHROSCOPY     JOINT REPLACEMENT Left    hip   JOINT REPLACEMENT Right    knee   KNEE  ARTHROPLASTY Right 01/12/2016   Procedure: RIGHT  TOTAL KNEE ARTHROPLASTY WITH COMPUTER NAVIGATION;  Surgeon: Rod Can, MD;  Location: [facility] [place];  Service: Orthopedics;  Laterality: Right;  Needs RNFA   NO PAST SURGERIES     RIGHT/LEFT HEART CATH AND CORONARY ANGIOGRAPHY N/A 04/21/2017   Procedure: RIGHT/LEFT HEART CATH AND CORONARY ANGIOGRAPHY;  Surgeon: Isaias Cowman, MD;  Location: [place];  Service: Cardiovascular;  Laterality: N/A;   SKIN SURGERY Left    arm   TEE WITHOUT CARDIOVERSION N/A 05/05/2017   Procedure: TRANSESOPHAGEAL ECHOCARDIOGRAM (TEE);  Surgeon: Gaye Pollack, MD;  Location: [place];  Service: Open Heart Surgery;  Laterality: N/A;   TRANSURETHRAL RESECTION OF BLADDER TUMOR N/A 05/15/2019   Procedure: TRANSURETHRAL RESECTION OF BLADDER TUMOR (TURBT);  Surgeon: Abbie Sons, MD;  Location: [facility];  Service: Urology;  Laterality: N/A;   TRANSURETHRAL RESECTION OF BLADDER TUMOR WITH MITOMYCIN-C N/A 03/14/2018   Procedure: TRANSURETHRAL RESECTION OF BLADDER TUMOR WITH Gemcitabine;  Surgeon: Abbie Sons, MD;  Location: [facility];  Service: Urology;  Laterality: N/A;    Prior to Admission medications   Medication Sig Start Date End Date Taking? Authorizing Provider  Ascorbic Acid (VITAMIN C) 1000 MG tablet Take 1,000 mg by mouth daily.  Yes [provider]  aspirin 81 MG EC tablet Take 1 tablet (81 mg total) by mouth daily. 05/11/17  Yes Conte, Tessa N, PA-C  atorvastatin (LIPITOR) 40 MG tablet TAKE 1 TABLET BY MOUTH ONCE DAILY AT 6 PM 03/12/20  Yes Cannady, Jolene T, NP  Cholecalciferol (VITAMIN D3) 50 MCG (2000 UT) TABS Take 2,000 Units by mouth daily.    Yes [provider]  Cyanocobalamin 1000 MCG TBCR Take 1,000 mcg by mouth daily.    Yes [provider]  furosemide (LASIX) 40 MG tablet TAKE ONE-HALF TABLET BY MOUTH ONCE DAILY 06/11/20  Yes Cannady, Jolene T, NP  HYDROcodone-acetaminophen (NORCO/VICODIN) 5-325 MG tablet Take  1 tablet by mouth every 6 (six) hours as needed for moderate pain. 05/15/19  Yes Stoioff, Ronda Fairly, MD  Iron, Ferrous Sulfate, 142 (45 Fe) MG TBCR Take 45 mg by mouth daily.   Yes [provider]  KLOR-CON M10 10 MEQ tablet Take 1 tablet (10 mEq total) by mouth daily. 03/12/20  Yes Cannady, Jolene T, NP  losartan (COZAAR) 25 MG tablet Take 1/2 (one-half) tablet by mouth once daily 03/12/20  Yes Cannady, Jolene T, NP  metoprolol tartrate (LOPRESSOR) 25 MG tablet Take 1 tablet (25 mg total) by mouth 2 (two) times daily. 03/12/20  Yes Cannady, Jolene T, NP  naproxen sodium (ALEVE) 220 MG tablet Take 220 mg by mouth daily as needed (pain).    Yes [provider]  SitaGLIPtin-MetFORMIN HCl (JANUMET XR) 50-1000 MG TB24 Take 1 tablet by mouth 2 (two) times daily. 03/12/20  Yes Marnee Guarneri T, NP    Allergies as of 06/20/2020   (No Known Allergies)    Family History  Problem Relation Age of Onset   Dementia Mother    Stroke Mother    Stroke Father    Diabetes Father    Heart disease Father    Cancer Sister    Hypertension Brother    Cancer Sister     Social History   Socioeconomic History   Marital status: Married    Spouse name: Not on file   Number of children: Not on file   Years of education: Not on file   Highest education level: 10th grade  Occupational History   Occupation: retired  Tobacco Use   Smoking status: Former    Years: 25.00    Pack years: 0.00    Types: Cigarettes    Quit date: 12/15/1980    Years since quitting: 39.6   Smokeless tobacco: Never  Vaping Use   Vaping Use: Never used  Substance and Sexual Activity   Alcohol use: No    Alcohol/week: 0.0 standard drinks   Drug use: No   Sexual activity: Yes  Other Topics Concern   Not on file  Social History Narrative   Not on file   Social Determinants of Health   Financial Resource Strain: Low Risk    Difficulty of Paying Living Expenses: Not hard at all  Food Insecurity: No Food  Insecurity   Worried About Charity fundraiser in the Last Year: Never true   [place] in the Last Year: Never true  Transportation Needs: No Transportation Needs   Lack of Transportation (Medical): No   Lack of Transportation (Non-Medical): No  Physical Activity: Inactive   Days of Exercise per Week: 0 days   Minutes of Exercise per Session: 0 min  Stress: No Stress Concern Present   Feeling of Stress : Not at  all  Social Connections: Not on file  Intimate Partner Violence: Not on file    Review of Systems: See HPI, otherwise negative ROS  Physical Exam: BP (!) 161/62   Pulse (!) 51   Temp 97.7 F (36.5 C) (Temporal)   Wt 93 kg   SpO2 97%   BMI 27.80 kg/m  General:   Alert,  pleasant and cooperative in NAD Head:  Normocephalic and atraumatic. Respiratory:  Normal work of breathing. Cardiovascular:  RRR  Impression/Plan: John [name] is here for cataract surgery.  Risks, benefits, limitations, and alternatives regarding cataract surgery have been reviewed with the patient.  Questions have been answered.  All parties agreeable.   Benay Pillow, MD  08/11/2020, 10:56 AM

## 2020-08-12 ENCOUNTER — Encounter: Payer: Self-pay | Admitting: Ophthalmology

## 2020-08-20 ENCOUNTER — Other Ambulatory Visit: Payer: Self-pay | Admitting: Urology

## 2020-08-20 ENCOUNTER — Telehealth: Payer: Self-pay | Admitting: Urology

## 2020-08-20 DIAGNOSIS — D494 Neoplasm of unspecified behavior of bladder: Secondary | ICD-10-CM

## 2020-08-20 NOTE — Telephone Encounter (Signed)
LMOM for pt. To return my call and discuss surgery date.

## 2020-09-03 ENCOUNTER — Other Ambulatory Visit: Payer: Self-pay | Admitting: *Deleted

## 2020-09-03 DIAGNOSIS — C679 Malignant neoplasm of bladder, unspecified: Secondary | ICD-10-CM

## 2020-09-04 ENCOUNTER — Encounter
Admission: RE | Admit: 2020-09-04 | Discharge: 2020-09-04 | Disposition: A | Discharge status: Home / Self Care | Payer: PPO | Source: Ambulatory Visit | Attending: Urology | Admitting: Urology

## 2020-09-04 ENCOUNTER — Other Ambulatory Visit: Payer: PPO

## 2020-09-04 ENCOUNTER — Other Ambulatory Visit: Payer: Self-pay

## 2020-09-04 DIAGNOSIS — C679 Malignant neoplasm of bladder, unspecified: Secondary | ICD-10-CM | POA: Diagnosis not present

## 2020-09-04 NOTE — Patient Instructions (Addendum)
Your procedure is scheduled on: September 16, 2020 TUESDAY Report to the Registration Desk on the 1st floor of the [facility]. To find out your arrival time, please call [phone] between 1PM - 3PM on: September 15, 2020 MONDAY  REMEMBER: Instructions that are not followed completely may result in serious medical risk, up to and including death; or upon the discretion of your surgeon and anesthesiologist your surgery may need to be rescheduled.  DO NOT EAT OR DRINK after midnight the night before surgery.  No gum chewing, lozengers or hard candies.  TAKE THESE MEDICATIONS THE MORNING OF SURGERY WITH A SIP OF WATER: METOPROLOL  Stop Metformin and/OR Janumet 2 days prior to surgery. LAST DOSE 09/13/2020 Saturday   STOP ASPIRIN 7 DAYS BEFORE SURGERY LAST DOSE Monday September 08, 2020  One week prior to surgery: Stop Anti-inflammatories (NSAIDS) such as Advil, Aleve, Ibuprofen, Motrin, Naproxen, Naprosyn and Aspirin based products such as Excedrin, Goodys Powder, BC Powder. Stop ANY OVER THE COUNTER supplements until after surgery. You may however, continue to take Tylenol if needed for pain up until the day of surgery. CONTINUE KLOR-CON  No Alcohol for 24 hours before or after surgery.  No Smoking including e-cigarettes for 24 hours prior to surgery.  No chewable tobacco products for at least 6 hours prior to surgery.  No nicotine patches on the day of surgery.  Do not use any "recreational" drugs for at least a week prior to your surgery.  Please be advised that the combination of cocaine and anesthesia may have negative outcomes, up to and including death. If you test positive for cocaine, your surgery will be cancelled.  On the morning of surgery brush your teeth with toothpaste and water, you may rinse your mouth with mouthwash if you wish. Do not swallow any toothpaste or mouthwash.  Do not wear jewelry, make-up, hairpins, clips or nail polish.  Do not wear lotions, powders, or  perfumes OR DEODORANT   Do not shave body from the neck down 48 hours prior to surgery just in case you cut yourself which could leave a site for infection.  Also, freshly shaved skin may become irritated if using the CHG soap.  Contact lenses, hearing aids and dentures may not be worn into surgery.  Do not bring valuables to the hospital. [facility] is not responsible for any missing/lost belongings or valuables.   SHOWER MORNING OF SURGERY  Notify your doctor if there is any change in your medical condition (cold, fever, infection).  Wear comfortable clothing (specific to your surgery type) to the hospital.  After surgery, you can help prevent lung complications by doing breathing exercises.  Take deep breaths and cough every 1-2 hours. Your doctor may order a device called an Incentive Spirometer to help you take deep breaths. When coughing or sneezing, hold a pillow firmly against your incision with both hands. This is called "splinting." Doing this helps protect your incision. It also decreases belly discomfort.  If you are being discharged the day of surgery, you will not be allowed to drive home. You will need a responsible adult (18 years or older) to drive you home and stay with you that night.   If you are taking public transportation, you will need to have a responsible adult (18 years or older) with you. Please confirm with your physician that it is acceptable to use public transportation.   Please call the [facility] at [phone] if you have any questions about  these instructions.  Surgery Visitation Policy:  Patients undergoing a surgery or procedure may have one family member or support person with them as long as that person is not COVID-19 positive or experiencing its symptoms.  That person may remain in the waiting area during the procedure.  Inpatient Visitation:    Visiting hours are 7 a.m. to 8 p.m. Inpatients will be allowed two  visitors daily. The visitors may change each day during the patient's stay. No visitors under the age of 82. Any visitor under the age of 44 must be accompanied by an adult. The visitor must pass COVID-19 screenings, use hand sanitizer when entering and exiting the patient's room and wear a mask at all times, including in the patient's room. Patients must also wear a mask when staff or their visitor are in the room. Masking is required regardless of vaccination status.

## 2020-09-07 LAB — CULTURE, URINE COMPREHENSIVE

## 2020-09-11 ENCOUNTER — Encounter: Payer: Self-pay | Admitting: Urology

## 2020-09-11 NOTE — Progress Notes (Signed)
Perioperative Services  Pre-Admission/Anesthesia Testing Clinical Review  Date: 09/12/20  Patient Demographics:  Name: John [name] DOB:   01-05-1941 MRN:   [id number]  Planned Surgical Procedure(s):    Case: [id number] Date/Time: 09/16/20 0928   Procedure: TRANSURETHRAL RESECTION OF BLADDER TUMOR (TURBT)   Anesthesia type: Choice   Pre-op diagnosis: BLADDER TUMOR   Location: [facility] OR ROOM 10 / [place] ORS FOR ANESTHESIA GROUP   Surgeons: Abbie Sons, MD     NOTE: Available PAT nursing documentation and vital signs have been reviewed. Clinical nursing staff has updated patient's PMH/PSHx, current medication list, and drug allergies/intolerances to ensure comprehensive history available to assist in medical decision making as it pertains to the aforementioned surgical procedure and anticipated anesthetic course. Extensive review of available clinical information performed. [place] PMH and PSHx updated with any diagnoses/procedures that  may have been inadvertently omitted during his intake with the pre-admission testing department's nursing staff.  Clinical Discussion:  John [name] is a 80 y.o. male who is submitted for pre-surgical anesthesia review and clearance prior to him undergoing the above procedure. Patient is a Former Smoker (quit 11/1980). Pertinent PMH includes: CAD (s/p CABG), severe aortic stenosis (s/p AVR), CHF, angina, cardiac murmur, mild pulmonary hypertension, HTN, HLD, T2DM, dyspnea, CKD-III, anemia, OA.  Patient is followed by cardiology Saralyn Pilar, MD). He was last seen in the cardiology clinic on 04/03/2020; notes reviewed. At the time of his clinic visit, the patient denied any chest pain, shortness of breath, PND, orthopnea, palpitations, significant peripheral edema, vertiginous symptoms, or presyncope/syncope.  Patient with a PMH significant for cardiovascular diagnoses.  Patient underwent diagnostic right/left heart catheterization on 04/21/2017 that  revealed significant three-vessel CAD.  There was 70% stenosis of the proximal LAD, 80% stenosis of the ostial LCx, 90% stenosis of the ostial ramus intermedius and 75% stenosis of the proximal PDA.  Cardiologist was unable to cross calcific aortic valve due to severe aortic stenosis.  There was mild pulmonary hypertension with a mean PA pressure of 34 mmHg.  Recommendations were for CVTS consult for consideration of CABG with AVR.  Patient underwent a four-vessel CABG procedure on 05/05/2017 at [facility] performed by Dr. Gilford Raid, MD.  LIMA-LAD, SVG-RI, SVG-OM, and SVG-PDA bypass grafts were placed.  Patient also underwent aortic valve replacement whereby a 23 mm [facility] Ease pericardial valve was placed.  TEE was performed revealing excellent function of bioprosthetic valve with a small perivalvular leak.  Aortic valve mean gradient 8 mmHg.  Last TTE performed on 08/02/2019 revealed mild left ventricular systolic dysfunction (LVEF 45%) with mild LVH.  There was mild mitral and tricuspid valve insufficiency (see full interpretation of cardiovascular testing and interventions below).  Patient on GDMT for his HTN and HLD diagnoses.  Blood pressure well controlled at 134/68 on currently prescribed CCB, ARB, diuretic, and beta-blocker therapies.  Patient is on a statin for his HLD. T2DM well controlled on currently prescribed regimen; Hgb A1c 7.0% when last checked on 06/12/2020. Functional capacity, as defined by DASI, is documented as being >/= 4 METS.  No changes were made to patient's medication regimen.  Patient to follow-up with outpatient cardiology in 6 months or sooner if needed.  Patient is scheduled to undergo a TURBT on 09/16/2020 with Dr. John Giovanni, MD.  Given patient's past medical history significant for cardiovascular diagnoses, presurgical cardiac clearance was sought by the PAT team. Per cardiology, "this patient is optimized for surgery and may proceed with the planned  procedural course with a LOW risk stratification". This patient is on daily antiplatelet therapy. He has been instructed on recommendations for holding his daily low-dose ASA for 7 days prior to his procedure with plans to restart as soon as postoperative bleeding risk felt to be minimized by his attending surgeon. The patient has been instructed that his last dose of his anticoagulant will be on 09/08/2020.  Patient denies previous perioperative complications with anesthesia in the past. In review of the available records, it is noted that patient underwent a MAC anesthetic course at [facility] (ASA III) in 07/2020 without documented complications.   Vitals with BMI 09/12/2020 09/12/2020 09/12/2020  Height - - -  Weight - - 205 lbs 3 oz  BMI - - 43.56  Systolic [phone]  Diastolic 78 71 61  Pulse - 60 59    Providers/Specialists:   NOTE: Primary physician provider listed below. Patient may have been seen by APP or partner within same practice.   PROVIDER ROLE / SPECIALTY LAST Claud Kelp, MD Urology (Surgeon) 07/30/2020   Venita Lick, NP Primary Care Provider 06/11/2020  Isaias Cowman, MD Cardiology 04/03/2020   Allergies:  Patient has no known allergies.  Current Home Medications:   No current facility-administered medications for this encounter.    Ascorbic Acid (VITAMIN C) 1000 MG tablet   atorvastatin (LIPITOR) 40 MG tablet   furosemide (LASIX) 40 MG tablet   Iron, Ferrous Sulfate, 142 (45 Fe) MG TBCR   KLOR-CON M10 10 MEQ tablet   losartan (COZAAR) 25 MG tablet   metoprolol tartrate (LOPRESSOR) 25 MG tablet   SitaGLIPtin-MetFORMIN HCl (JANUMET XR) 50-1000 MG TB24   History:   Past Medical History:  Diagnosis Date   Anemia    Anginal pain (HCC)    Aortic stenosis    s/p AVR on 05/05/2017   Bladder tumor    Cataracts, bilateral    CHF (congestive heart failure) (HCC)    CKD (chronic kidney disease), stage III (HCC)    Coronary  artery disease    Dyspnea    Heart murmur    History of colon polyps    benign   Hx of CABG 05/05/2017   4v; LIMA-LAD, SVG-OM, SVG-RI, SVG-PDA   Hyperlipidemia    Hypertension    Muscle cramps    occasionally in legs    Osteoarthritis    S/P AVR (aortic valve replacement) 05/05/2017   20m [facility] Magna-Ease pericardial valve   [place] 03/13[place]   Skin cancer of face    T2DM (type 2 diabetes mellitus) ([place] 2009   Past Surgical History:  Procedure Laterality Date   AORTIC VALVE REPLACEMENT N/A 05/05/2017   Procedure: AORTIC VALVE REPLACEMENT (AVR) USING MAGNA EASE PERICARDIAL BIOPROSTHESIS - AORTIC VALVE MODEL 3300TFX, SIZE 23 MM;  Surgeon: BGaye Pollack MD;  [facility];  Service: Open Heart Surgery;  Laterality: N/A;   CATARACT EXTRACTION W/PHACO Right 07/21/2020   Procedure: CATARACT EXTRACTION PHACO AND INTRAOCULAR LENS PLACEMENT (IOC) RIGHT DIABETIC 9.09 00:51.3;  Surgeon: KEulogio Bear MD;  [place]  Service: Ophthalmology;  Laterality: Right;   CATARACT EXTRACTION W/PHACO Left 08/11/2020   Procedure: CATARACT EXTRACTION [place] AND [place]OCULAR LENS PLACEMENT ([place] DIABET[place];  Surgeon: KEulogio Bear MD;  [place] [facility]  Service: Ophthalmology;  Laterality: Left;  5.08 0:37.2   COLONOSCOPY     CORONARY ARTERY BYPASS GRAFT N/A 05/05/2017   Procedure: CORONARY ARTERY BYPASS  GRAFTING (CABG) x 4 WITH ENDOSCOPIC HARVESTING OF RIGHT SAPHENOUS VEIN (4v; LIMA-LAD, SVG-OM, SVG-RI, SVG-PDA);  Surgeon: Gaye Pollack, MD;  Location: [place];  Service: Open Heart Surgery;  Laterality: N/A;   CYSTOURETHROSCOPY     JOINT REPLACEMENT Left    hip   KNEE ARTHROPLASTY Right 01/12/2016   Procedure: RIGHT  TOTAL KNEE ARTHROPLASTY WITH COMPUTER NAVIGATION;  Surgeon: Rod Can, MD;  Location: [place];  Service: Orthopedics;  Laterality: Right;  Needs RNFA   RIGHT/LEFT HEART CATH AND CORONARY ANGIOGRAPHY N/A 04/21/2017    Procedure: RIGHT/LEFT HEART CATH AND CORONARY ANGIOGRAPHY;  Surgeon: Isaias Cowman, MD;  Location: [place] [facility];  Service: Cardiovascular;  Laterality: N/A;   SKIN SURGERY Left    arm   TEE WITHOUT CARDIOVERSION N/A 05/05/2017   Procedure: TRANSESOPHAGEAL ECHOCARDIOGRAM (TEE);  Surgeon: Gaye Pollack, MD;  Location: [place];  Service: Open Heart Surgery;  Laterality: N/A;   TRANSURETHRAL RESECTION OF BLADDER TUMOR N/A 05/15/2019   Procedure: TRANSURETHRAL RESECTION OF BLADDER TUMOR (TURBT);  Surgeon: Abbie Sons, MD;  Location: [facility] ORS;  Service: Urology;  Laterality: N/A;   TRANSURETHRAL RESECTION OF BLADDER TUMOR WITH MITOMYCIN-C N/A 03/14/2018   Procedure: TRANSURETHRAL RESECTION OF BLADDER TUMOR WITH Gemcitabine;  Surgeon: Abbie Sons, MD;  Location: [facility] ORS;  Service: Urology;  Laterality: N/A;   Family History  Problem Relation Age of Onset   Dementia Mother    Stroke Mother    Stroke Father    Diabetes Father    Heart disease Father    Cancer Sister    Hypertension Brother    Cancer Sister    Social History   Tobacco Use   Smoking status: Former    Years: 25.00    Types: Cigarettes    Quit date: 12/15/1980    Years since quitting: 39.7   Smokeless tobacco: Never  Vaping Use   Vaping Use: Never used  Substance Use Topics   Alcohol use: No    Alcohol/week: 0.0 standard drinks   Drug use: No    Pertinent Clinical Results:  LABS: Labs reviewed: Acceptable for surgery.  Hospital Outpatient Visit on 09/12/2020  Component Date Value Ref Range Status   WBC 09/12/2020 7.4  4.0 - 10.5 K/uL Final   RBC 09/12/2020 4.22  4.22 - 5.81 MIL/uL Final   Hemoglobin 09/12/2020 12.2 (A) 13.0 - 17.0 g/dL Final   HCT 09/12/2020 37.8 (A) 39.0 - 52.0 % Final   MCV 09/12/2020 89.6  80.0 - 100.0 fL Final   MCH 09/12/2020 28.9  26.0 - 34.0 pg Final   MCHC 09/12/2020 32.3  30.0 - 36.0 g/dL Final   RDW 09/12/2020 12.7  11.5 - 15.5 % Final   Platelets 09/12/2020  195  150 - 400 K/uL Final   nRBC 09/12/2020 0.0  0.0 - 0.2 % Final   Performed at [facility], [address] [place], [place] [address]   Sodium 09/12/2020 142  135 - 145 mmol/L Final   Potassium 09/12/2020 3.8  3.5 - 5.1 mmol/L Final   Chloride 09/12/2020 104  98 - 111 mmol/L Final   CO2 09/12/2020 30  22 - 32 mmol/L Final   Glucose, Bld 09/12/2020 188 (A) 70 - 99 mg/dL Final   Glucose reference range applies only to samples taken after fasting for at least 8 hours.   BUN 09/12/2020 25 (A) 8 - 23 mg/dL Final   Creatinine, Ser 09/12/2020 1.19  0.61 - 1.24 mg/dL Final  Calcium 09/12/2020 8.8 (A) 8.9 - 10.3 mg/dL Final   GFR, Estimated 09/12/2020 >60  >60 mL/min Final   Comment: (NOTE) Calculated using the CKD-EPI Creatinine Equation (2021)    Anion gap 09/12/2020 8  5 - 15 Final   Performed at [facility], [address] [place],  [address]  Office Visit on 09/12/2020  Component Date Value Ref Range Status   HB A1C (BAYER DCA - WAIVED) 09/12/2020 6.4  <7.0 % Final   Comment:                                       Diabetic Adult            <7.0                                       Healthy Adult        4.3 - 5.7                                                           (DCCT/NGSP) American Diabetes Association's Summary of Glycemic Recommendations for Adults with Diabetes: Hemoglobin A1c <7.0%. More stringent glycemic goals (A1c <6.0%) may further reduce complications at the cost of increased risk of hypoglycemia.     ECG: Date: 09/12/2020 Time ECG obtained: 1112 AM Rate: 73 bpm Rhythm:  Sinus rhythm with first-degree AV block Axis (leads I and aVF): Normal Intervals: PR 210 ms. QRS 102 ms. QTc 456 ms. ST segment and T wave changes: No evidence of acute ST segment elevation or depression Comparison: Similar to previous tracing obtained on 05/07/2019   IMAGING / PROCEDURES: TRANSTHORACIC ECHOCARDIOGRAM performed on 08/02/2019 LVEF 45% Mild left  ventricular systolic dysfunction with mild LVH Normal right ventricular systolic function Mild MR and TR No AR or PR No valvular stenosis  No evidence of pericardial effusion  TRANSESOPHAGEAL ECHOCARDIOGRAM performed on 05/05/2017 Left ventricular cavity size was normal. Wall thickness was normal. Systolic function was normal. The estimated ejection   fraction was in the range of 55% to 60%. Wall motion was normal; there were no regional wall motion abnormalities.  Aortic valve normal-sized, mildly calcified annulus. Trileaflet; moderately thickened leaflets. Cusp separation was reduced. Transvalvular velocity was increased. There was moderate to severe stenosis. There was trivial regurgitation. Mean gradient (S): 38 mm Hg. Peak gradient (S): 61 mm Hg. Valve area (VTI): 1 cm^2. Valve area (Vmax): 1.14 cm^2. Valve area (Vmean): 1.04 cm^2. There was mild regurgitation directed centrally.  Limited Post-CPB exam: Improved, vigorous LV function. overall EF approximately 70%. Prosthetic aortic valve  well seated in the aortic annulus. No AI seen in LV outflow tract. Post replacement images demonstrate no residual valvular  insufficiency, with small perivalvular leak. Aortic valve peak gradient 11 mmHg, mean gradient 8 mmHg. No change post bypass in mitral valve function, with mild MR remaining.  CORONARY ARTERY BYPASS GRAFTING procedure performed on 05/05/2017 Four-vessel CABG procedure LIMA-LAD SVG-OM SVG-RI SVG-PDA  RIGHT/LEFT HEART CATHETERIZATION AND CORONARY ANGIOGRAPHY performed on 04/21/2017 Three-vessel CAD 90% stenosis of the ostial ramus intermedius 80% stenosis of the ostial to proximal LCx 70% stenosis of  the ostial to proximal LAD 60% stenosis of the proximal LAD 40% stenosis of the proximal RCA 40% stenosis of the mid to distal RCA 75% stenosis of the ostial RPDA to RPDA Unable to cross calcific aortic valve with a straight wire Severe aortic stenosis with calculated aortic  valve area of 1.0 cm by 2D echocardiogram Mild pulmonary HTN with a mean pulmonary artery pressure of 34 mmHg Recommendations: CVTS consult for consideration of CABG and AVR  Impression and Plan:  John [name] has been referred for pre-anesthesia review and clearance prior to him undergoing the planned anesthetic and procedural courses. Available labs, pertinent testing, and imaging results were personally reviewed by me. This patient has been appropriately cleared by cardiology with an overall LOW risk of significant perioperative cardiovascular complications.  Based on clinical review performed today (09/12/20), barring any significant acute changes in the patient's overall condition, it is anticipated that he will be able to proceed with the planned surgical intervention. Any acute changes in clinical condition may necessitate his procedure being postponed and/or cancelled. Patient will meet with anesthesia team (MD and/or CRNA) on the day of his procedure for preoperative evaluation/assessment. Questions regarding anesthetic course will be fielded at that time.   Pre-surgical instructions were reviewed with the patient during his PAT appointment and questions were fielded by PAT clinical staff. Patient was advised that if any questions or concerns arise prior to his procedure then he should return a call to PAT and/or his surgeon's office to discuss.  Honor Loh, MSN, APRN, FNP-C, CEN [facility]  Peri-operative Services Nurse Practitioner Phone: [phone] Fax: [phone] 09/12/20 12:02 PM  NOTE: This note has been prepared using Dragon dictation software. Despite my best ability to proofread, there is always the potential that unintentional transcriptional errors may still occur from this process.

## 2020-09-12 ENCOUNTER — Encounter
Admission: RE | Admit: 2020-09-12 | Discharge: 2020-09-12 | Disposition: A | Discharge status: Home / Self Care | Payer: PPO | Source: Ambulatory Visit | Attending: Urology | Admitting: Urology

## 2020-09-12 ENCOUNTER — Other Ambulatory Visit: Payer: Self-pay

## 2020-09-12 ENCOUNTER — Encounter: Payer: Self-pay | Admitting: Urology

## 2020-09-12 ENCOUNTER — Encounter: Payer: Self-pay | Admitting: Urgent Care

## 2020-09-12 ENCOUNTER — Ambulatory Visit (INDEPENDENT_AMBULATORY_CARE_PROVIDER_SITE_OTHER): Payer: PPO | Admitting: Nurse Practitioner

## 2020-09-12 ENCOUNTER — Encounter: Payer: Self-pay | Admitting: Nurse Practitioner

## 2020-09-12 VITALS — BP 132/78 | HR 60 | Temp 97.7°F | Wt 205.2 lb

## 2020-09-12 DIAGNOSIS — I130 Hypertensive heart and chronic kidney disease with heart failure and stage 1 through stage 4 chronic kidney disease, or unspecified chronic kidney disease: Secondary | ICD-10-CM

## 2020-09-12 DIAGNOSIS — C679 Malignant neoplasm of bladder, unspecified: Secondary | ICD-10-CM | POA: Diagnosis not present

## 2020-09-12 DIAGNOSIS — E785 Hyperlipidemia, unspecified: Secondary | ICD-10-CM | POA: Diagnosis not present

## 2020-09-12 DIAGNOSIS — D692 Other nonthrombocytopenic purpura: Secondary | ICD-10-CM | POA: Diagnosis not present

## 2020-09-12 DIAGNOSIS — Z01818 Encounter for other preprocedural examination: Secondary | ICD-10-CM | POA: Insufficient documentation

## 2020-09-12 DIAGNOSIS — I350 Nonrheumatic aortic (valve) stenosis: Secondary | ICD-10-CM

## 2020-09-12 DIAGNOSIS — I440 Atrioventricular block, first degree: Secondary | ICD-10-CM | POA: Insufficient documentation

## 2020-09-12 DIAGNOSIS — E1169 Type 2 diabetes mellitus with other specified complication: Secondary | ICD-10-CM | POA: Diagnosis not present

## 2020-09-12 DIAGNOSIS — I5022 Chronic systolic (congestive) heart failure: Secondary | ICD-10-CM

## 2020-09-12 DIAGNOSIS — N1831 Chronic kidney disease, stage 3a: Secondary | ICD-10-CM | POA: Diagnosis not present

## 2020-09-12 DIAGNOSIS — D508 Other iron deficiency anemias: Secondary | ICD-10-CM

## 2020-09-12 DIAGNOSIS — E1122 Type 2 diabetes mellitus with diabetic chronic kidney disease: Secondary | ICD-10-CM | POA: Diagnosis not present

## 2020-09-12 LAB — CBC
HCT: 37.8 % — ABNORMAL LOW (ref 39.0–52.0)
Hemoglobin: 12.2 g/dL — ABNORMAL LOW (ref 13.0–17.0)
MCH: 28.9 pg (ref 26.0–34.0)
MCHC: 32.3 g/dL (ref 30.0–36.0)
MCV: 89.6 fL (ref 80.0–100.0)
Platelets: 195 10*3/uL (ref 150–400)
RBC: 4.22 MIL/uL (ref 4.22–5.81)
RDW: 12.7 % (ref 11.5–15.5)
WBC: 7.4 10*3/uL (ref 4.0–10.5)
nRBC: 0 % (ref 0.0–0.2)

## 2020-09-12 LAB — BASIC METABOLIC PANEL
Anion gap: 8 (ref 5–15)
BUN: 25 mg/dL — ABNORMAL HIGH (ref 8–23)
CO2: 30 mmol/L (ref 22–32)
Calcium: 8.8 mg/dL — ABNORMAL LOW (ref 8.9–10.3)
Chloride: 104 mmol/L (ref 98–111)
Creatinine, Ser: 1.19 mg/dL (ref 0.61–1.24)
GFR, Estimated: >60 mL/min (ref >60)
Glucose, Bld: 188 mg/dL — ABNORMAL HIGH (ref 70–99)
Potassium: 3.8 mmol/L (ref 3.5–5.1)
Sodium: 142 mmol/L (ref 135–145)

## 2020-09-12 LAB — BAYER DCA HB A1C WAIVED: HB A1C (BAYER DCA - WAIVED): 6.4 % (ref <7.0)

## 2020-09-12 NOTE — Patient Instructions (Signed)
Diabetes Mellitus and Nutrition, Adult When you have diabetes, or diabetes mellitus, it is very important to have healthy eating habits because your blood sugar (glucose) levels are greatly affected by what you eat and drink. Eating healthy foods in the right amounts, at about the same times every day, can help you:  Control your blood glucose.  Lower your risk of heart disease.  Improve your blood pressure.  Reach or maintain a healthy weight. What can affect my meal plan? Every person with diabetes is different, and each person has different needs for a meal plan. Your health care provider may recommend that you work with a dietitian to make a meal plan that is best for you. Your meal plan may vary depending on factors such as:  The calories you need.  The medicines you take.  Your weight.  Your blood glucose, blood pressure, and cholesterol levels.  Your activity level.  Other health conditions you have, such as heart or kidney disease. How do carbohydrates affect me? Carbohydrates, also called carbs, affect your blood glucose level more than any other type of food. Eating carbs naturally raises the amount of glucose in your blood. Carb counting is a method for keeping track of how many carbs you eat. Counting carbs is important to keep your blood glucose at a healthy level, especially if you use insulin or take certain oral diabetes medicines. It is important to know how many carbs you can safely have in each meal. This is different for every person. Your dietitian can help you calculate how many carbs you should have at each meal and for each snack. How does alcohol affect me? Alcohol can cause a sudden decrease in blood glucose (hypoglycemia), especially if you use insulin or take certain oral diabetes medicines. Hypoglycemia can be a life-threatening condition. Symptoms of hypoglycemia, such as sleepiness, dizziness, and confusion, are similar to symptoms of having too much  alcohol.  Do not drink alcohol if: ? Your health care provider tells you not to drink. ? You are pregnant, may be pregnant, or are planning to become pregnant.  If you drink alcohol: ? Do not drink on an empty stomach. ? Limit how much you use to:  0-1 drink a day for women.  0-2 drinks a day for men. ? Be aware of how much alcohol is in your drink. In the [place] one drink equals one 12 oz bottle of beer (355 mL), one 5 oz glass of wine (148 mL), or one 1 oz glass of hard liquor (44 mL). ? Keep yourself hydrated with water, diet soda, or unsweetened iced tea.  Keep in mind that regular soda, juice, and other mixers may contain a lot of sugar and must be counted as carbs. What are tips for following this plan? Reading food labels  Start by checking the serving size on the "Nutrition Facts" label of packaged foods and drinks. The amount of calories, carbs, fats, and other nutrients listed on the label is based on one serving of the item. Many items contain more than one serving per package.  Check the total grams (g) of carbs in one serving. You can calculate the number of servings of carbs in one serving by dividing the total carbs by 15. For example, if a food has 30 g of total carbs per serving, it would be equal to 2 servings of carbs.  Check the number of grams (g) of saturated fats and trans fats in one serving. Choose foods that have   a low amount or none of these fats.  Check the number of milligrams (mg) of salt (sodium) in one serving. Most people should limit total sodium intake to less than 2,300 mg per day.  Always check the nutrition information of foods labeled as "low-fat" or "nonfat." These foods may be higher in added sugar or refined carbs and should be avoided.  Talk to your dietitian to identify your daily goals for nutrients listed on the label. Shopping  Avoid buying canned, pre-made, or processed foods. These foods tend to be high in fat, sodium, and added  sugar.  Shop around the outside edge of the grocery store. This is where you will most often find fresh fruits and vegetables, bulk grains, fresh meats, and fresh dairy. Cooking  Use low-heat cooking methods, such as baking, instead of high-heat cooking methods like deep frying.  Cook using healthy oils, such as olive, canola, or sunflower oil.  Avoid cooking with butter, cream, or high-fat meats. Meal planning  Eat meals and snacks regularly, preferably at the same times every day. Avoid going long periods of time without eating.  Eat foods that are high in fiber, such as fresh fruits, vegetables, beans, and whole grains. Talk with your dietitian about how many servings of carbs you can eat at each meal.  Eat 4-6 oz (112-168 g) of lean protein each day, such as lean meat, chicken, fish, eggs, or tofu. One ounce (oz) of lean protein is equal to: ? 1 oz (28 g) of meat, chicken, or fish. ? 1 egg. ?  cup (62 g) of tofu.  Eat some foods each day that contain healthy fats, such as avocado, nuts, seeds, and fish.   What foods should I eat? Fruits Berries. Apples. Oranges. Peaches. Apricots. Plums. Grapes. Mango. Papaya. Pomegranate. Kiwi. Cherries. Vegetables Lettuce. Spinach. Leafy greens, including kale, chard, collard greens, and mustard greens. Beets. Cauliflower. Cabbage. Broccoli. Carrots. Green beans. Tomatoes. Peppers. Onions. Cucumbers. Brussels sprouts. Grains Whole grains, such as whole-wheat or whole-grain bread, crackers, tortillas, cereal, and pasta. Unsweetened oatmeal. Quinoa. Brown or wild rice. Meats and other proteins Seafood. Poultry without skin. Lean cuts of poultry and beef. Tofu. Nuts. Seeds. Dairy Low-fat or fat-free dairy products such as milk, yogurt, and cheese. The items listed above may not be a complete list of foods and beverages you can eat. Contact a dietitian for more information. What foods should I avoid? Fruits Fruits canned with  syrup. Vegetables Canned vegetables. Frozen vegetables with butter or cream sauce. Grains Refined white flour and flour products such as bread, pasta, snack foods, and cereals. Avoid all processed foods. Meats and other proteins Fatty cuts of meat. Poultry with skin. Breaded or fried meats. Processed meat. Avoid saturated fats. Dairy Full-fat yogurt, cheese, or milk. Beverages Sweetened drinks, such as soda or iced tea. The items listed above may not be a complete list of foods and beverages you should avoid. Contact a dietitian for more information. Questions to ask a health care provider  Do I need to meet with a diabetes educator?  Do I need to meet with a dietitian?  What number can I call if I have questions?  When are the best times to check my blood glucose? Where to find more information:  American Diabetes Association: diabetes.org  Academy of Nutrition and Dietetics: www.eatright.org  National Institute of Diabetes and Digestive and Kidney Diseases: www.niddk.nih.gov  Association of Diabetes Care and Education Specialists: www.diabeteseducator.org Summary  It is important to have healthy eating   habits because your blood sugar (glucose) levels are greatly affected by what you eat and drink.  A healthy meal plan will help you control your blood glucose and maintain a healthy lifestyle.  Your health care provider may recommend that you work with a dietitian to make a meal plan that is best for you.  Keep in mind that carbohydrates (carbs) and alcohol have immediate effects on your blood glucose levels. It is important to count carbs and to use alcohol carefully. This information is not intended to replace advice given to you by your health care provider. Make sure you discuss any questions you have with your health care provider. Document Revised: 01/23/2019 Document Reviewed: 01/23/2019 Elsevier Patient Education  2021 Elsevier Inc.  

## 2020-09-12 NOTE — Progress Notes (Signed)
BP 132/78   Pulse 60   Temp 97.7 F (36.5 C) (Oral)   Wt 205 lb 3.2 oz (93.1 kg)   SpO2 99%   BMI 27.83 kg/m    Subjective:    Patient ID: John [name], male    DOB: 02-24-1941, 80 y.o.   MRN: [id number]  HPI: John [name] is a 80 y.o. male  Chief Complaint  Patient presents with   Diabetes   Chronic Kidney Disease   Congestive Heart Failure   DIABETES Continues on Janumet XR 50-1000.  Last A1C 7% in April.  Hypoglycemic episodes:no Polydipsia/polyuria: no Visual disturbance: no Chest pain: no Paresthesias: no Glucose Monitoring: no             Accucheck frequency: Not Checking             Fasting glucose:             Post prandial:             Evening:             Before meals: Taking Insulin?: no             Long acting insulin:             Short acting insulin: Blood Pressure Monitoring: rarely Retinal Examination: Not Up To Date Foot Exam: Up to Date Pneumovax: Up to Date Influenza: Up to Date Aspirin: yes   ANEMIA History of low iron and B12 levels.  Last labs improved.  He continues supplements daily, except for this week he is on hold due to surgery. Anemia status: stable Etiology of anemia: unknown Duration of anemia treatment: months Compliance with treatment: good compliance Iron supplementation side effects: no Severity of anemia: mild Fatigue: no Decreased exercise tolerance: no  Dyspnea on exertion: no Palpitations: no Bleeding: no Pica: no   HYPERTENSION / HYPERLIPIDEMIA/HF Continues on Lasix and Metoprolol + Losartan + Lipitor, ASA. Last saw Dr. Josefa Half 04/03/20 with no medications changes. Has moderate underlying aortic stenosis noted on past imaging, which is monitored by cardiology.  Last echo 08/02/19 -- mild LV dysfunction with LVH, EF 45%.  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 Satisfied with current treatment? yes Duration of hypertension: chronic BP monitoring frequency: not checking BP range:  BP medication  side effects: no Duration of hyperlipidemia: chronic Cholesterol medication side effects: no Cholesterol supplements: none Medication compliance: good compliance Aspirin: yes Recent stressors: no Recurrent headaches: no Visual changes: no Palpitations: no Dyspnea: no Chest pain: no Lower extremity edema: yes, at baseline Dizzy/lightheaded: no   BLADDER CANCER: Monitor by urology with last visit with Dr. Bernardo Heater on 07/30/20 and is scheduled for surgery on 09/16/20 (resection).  He does endorse that each time he has these procedures it takes longer to recover, so he is pondering whether to continue doing these as he reports belly pain lasts for several weeks.   Relevant past medical, surgical, family and social history reviewed and updated as indicated. Interim medical history since our last visit reviewed. Allergies and medications reviewed and updated.  Review of Systems  Constitutional:  Negative for activity change, diaphoresis, fatigue and fever.  Respiratory:  Negative for cough, chest tightness, shortness of breath and wheezing.   Cardiovascular:  Negative for chest pain, palpitations and leg swelling.  Gastrointestinal: Negative.   Endocrine: Negative for cold intolerance, heat intolerance, polydipsia, polyphagia and polyuria.  Neurological: Negative.   Psychiatric/Behavioral: Negative.     Per HPI  unless specifically indicated above     Objective:    BP 132/78   Pulse 60   Temp 97.7 F (36.5 C) (Oral)   Wt 205 lb 3.2 oz (93.1 kg)   SpO2 99%   BMI 27.83 kg/m   Wt Readings from Last 3 Encounters:  09/12/20 205 lb 3.2 oz (93.1 kg)  09/04/20 206 lb (93.4 kg)  08/11/20 205 lb (93 kg)    Physical Exam Vitals and nursing note reviewed.  Constitutional:      General: He is awake. He is not in acute distress.    Appearance: He is well-developed and overweight. He is not ill-appearing.  HENT:     Head: Normocephalic and atraumatic.     Right Ear: Hearing normal. No  drainage.     Left Ear: Hearing normal. No drainage.  Eyes:     General: Lids are normal.        Right eye: No discharge.        Left eye: No discharge.     Conjunctiva/sclera: Conjunctivae normal.     Pupils: Pupils are equal, round, and reactive to light.  Neck:     Thyroid: No thyromegaly.     Vascular: No carotid bruit.  Cardiovascular:     Rate and Rhythm: Normal rate and regular rhythm.     Heart sounds: S1 normal and S2 normal. Murmur heard.  Systolic murmur is present with a grade of 2/6.    No gallop.  Pulmonary:     Effort: Pulmonary effort is normal. No accessory muscle usage or respiratory distress.     Breath sounds: Normal breath sounds.  Abdominal:     General: Bowel sounds are normal.     Palpations: Abdomen is soft.  Musculoskeletal:        General: Normal range of motion.     Cervical back: Normal range of motion and neck supple.     Right lower leg: No edema.     Left lower leg: No edema.  Skin:    General: Skin is warm and dry.     Capillary Refill: Capillary refill takes less than 2 seconds.     Comments: Scattered pale purple bruises noted to bilateral upper extremities, skin intact.  Neurological:     Mental Status: He is alert and oriented to person, place, and time.     Deep Tendon Reflexes: Reflexes are normal and symmetric.  Psychiatric:        Attention and Perception: Attention normal.        Mood and Affect: Mood normal.        Speech: Speech normal.        Behavior: Behavior normal. Behavior is cooperative.        Thought Content: Thought content normal.   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 BP at goal for age today.  Avoid hypotension. Continue current medication regimen and adjust as needed.  Urine ALB improved at 10 last visit and A:C <30.  Losartan for kidney protection.  Refer to nephrology if  worsening kidney function.  Recommend he monitor his BP regularly at home and follow DASH diet.  Continue to collaborate with cardiology.  Return in 3 months.       Relevant Orders   Bayer DCA Hb A1c Waived   Aortic valve stenosis, moderate    Followed by cardiology.  Last echo June 2021.  Continue to collaborate with cardiology.       Senile purpura ([place])    As evidenced by scattered bruises bilaterall upper extremities.  Gentle skin cleansers at home and lotion.  Monitor for skin breakdown, report to provider if present.       Chronic systolic heart failure (HCC)    Chronic, ongoing.  Euvolemic today.  Continue collaboration with cardiology and current medication regimen.  Recommend: - Reminded to call for an overnight weight gain of >2 pounds or a weekly weight weight of >5 pounds - not adding salt to his food and has been reading food labels. Reviewed the importance of keeping daily sodium intake to 2000mg  daily         Endocrine   Hyperlipidemia associated with type 2 diabetes mellitus (HCC)    Chronic, ongoing.  Continue current medication regimen and adjust as needed.  Lipid panel today.       Relevant Orders   Bayer DCA Hb A1c Waived   Lipid Panel w/o Chol/HDL Ratio   Type 2 diabetes mellitus with chronic kidney disease ([place]) - Primary    Chronic, ongoing with A1C 6.5% last visit, will recheck today.  Urine ALB improved at 10 last visit and A:C <30.   Losartan for kidney protection.  Continue current medication regimen and adjust as needed.  Return in 3 months.  Avoid hypoglycemia due to advanced age.  Will refer him to nephrology if worsening kidney function presents.       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       CKD (chronic kidney disease) stage 3, GFR 30-59 ml/min (HCC)    Chronic, stable with recent labs showing improved CRT and GFR last visit.  Continue Losartan for kidney protection.  Refer to nephrology as needed.         Other   Iron deficiency anemia    Ongoing, stable.  Continue daily iron.  Recheck iron and B12 level next visit + CBC -- recent levels stable.         Follow up plan: Return in about 3 months (around 12/13/2020) for T2DM, HTN/HLD, BLADDER CANCER, CKD, ANEMIA.

## 2020-09-12 NOTE — Assessment & Plan Note (Signed)
Ongoing, stable.  Continue daily iron.  Recheck iron and B12 level next visit + CBC -- recent levels stable.

## 2020-09-12 NOTE — Assessment & Plan Note (Signed)
Chronic, ongoing.  Continue current medication regimen and adjust as needed. Lipid panel today. 

## 2020-09-12 NOTE — Assessment & Plan Note (Signed)
Chronic, ongoing with A1C 6.5% last visit, will recheck today.  Urine ALB improved at 10 last visit and A:C <30.   Losartan for kidney protection.  Continue current medication regimen and adjust as needed.  Return in 3 months.  Avoid hypoglycemia due to advanced age.  Will refer him to nephrology if worsening kidney function presents.

## 2020-09-12 NOTE — Assessment & Plan Note (Signed)
Chronic, ongoing. BP at goal for age today.  Avoid hypotension. Continue current medication regimen and adjust as needed.  Urine ALB improved at 10 last visit and A:C <30.  Losartan for kidney protection.  Refer to nephrology if worsening kidney function.  Recommend he monitor his BP regularly at home and follow DASH diet.  Continue to collaborate with cardiology.  Return in 3 months.

## 2020-09-12 NOTE — Assessment & Plan Note (Signed)
Continue to collaborate with urology, at length discussion on goals of care will continue at visits. 

## 2020-09-12 NOTE — Assessment & Plan Note (Signed)
Followed by cardiology.  Last echo June 2021.  Continue to collaborate with cardiology. 

## 2020-09-12 NOTE — Assessment & Plan Note (Signed)
As evidenced by scattered bruises bilaterall upper extremities.  Gentle skin cleansers at home and lotion.  Monitor for skin breakdown, report to provider if present. 

## 2020-09-12 NOTE — Assessment & Plan Note (Signed)
Chronic, ongoing.  Euvolemic today.  Continue collaboration with cardiology and current medication regimen.  Recommend: - Reminded to call for an overnight weight gain of >2 pounds or a weekly weight weight of >5 pounds - not adding salt to his food and has been reading food labels. Reviewed the importance of keeping daily sodium intake to 2000mg  daily

## 2020-09-12 NOTE — Assessment & Plan Note (Signed)
Chronic, stable with recent labs showing improved CRT and GFR last visit.  Continue Losartan for kidney protection.  Refer to nephrology as needed.

## 2020-09-13 LAB — LIPID PANEL W/O CHOL/HDL RATIO
Cholesterol, Total: 116 mg/dL (ref 100–199)
HDL: 55 mg/dL (ref >39)
LDL Chol Calc (NIH): 46 mg/dL (ref 0–99)
Triglycerides: 76 mg/dL (ref 0–149)
VLDL Cholesterol Cal: 15 mg/dL (ref 5–40)

## 2020-09-13 NOTE — Progress Notes (Signed)
Good morning, please let Rondall know his cholesterol levels remain fabulous.  Continue all current medications and good luck with surgery.  Any questions? Keep being awesome!!  Thank you for allowing me to participate in your care.  I appreciate you. Kindest regards, Vahan Wadsworth

## 2020-09-15 MED ORDER — CEFAZOLIN SODIUM-DEXTROSE 2-4 GM/100ML-% IV SOLN
2.0000 g | INTRAVENOUS | Status: AC
Start: 2020-09-15 — End: 2020-09-16
  Administered 2020-09-16: 2 g via INTRAVENOUS

## 2020-09-15 MED ORDER — SODIUM CHLORIDE 0.9 % IV SOLN: INTRAVENOUS | Status: DC

## 2020-09-15 MED ORDER — FAMOTIDINE 20 MG PO TABS: 20.0000 mg | ORAL_TABLET | Freq: Once | ORAL | Status: AC

## 2020-09-16 ENCOUNTER — Encounter
Admission: RE | Disposition: A | Discharge status: Home / Self Care | Payer: Self-pay | Source: Home / Self Care | Attending: Urology

## 2020-09-16 ENCOUNTER — Ambulatory Visit: Payer: PPO | Admitting: Urgent Care

## 2020-09-16 ENCOUNTER — Ambulatory Visit
Admission: RE | Admit: 2020-09-16 | Discharge: 2020-09-16 | Disposition: A | Discharge status: Home / Self Care | Payer: PPO | Attending: Urology | Admitting: Urology

## 2020-09-16 ENCOUNTER — Other Ambulatory Visit: Payer: Self-pay

## 2020-09-16 ENCOUNTER — Encounter: Payer: Self-pay | Admitting: Urology

## 2020-09-16 DIAGNOSIS — D494 Neoplasm of unspecified behavior of bladder: Secondary | ICD-10-CM | POA: Diagnosis not present

## 2020-09-16 DIAGNOSIS — Z951 Presence of aortocoronary bypass graft: Secondary | ICD-10-CM | POA: Diagnosis not present

## 2020-09-16 DIAGNOSIS — Z7984 Long term (current) use of oral hypoglycemic drugs: Secondary | ICD-10-CM | POA: Diagnosis not present

## 2020-09-16 DIAGNOSIS — Z833 Family history of diabetes mellitus: Secondary | ICD-10-CM | POA: Diagnosis not present

## 2020-09-16 DIAGNOSIS — Z8249 Family history of ischemic heart disease and other diseases of the circulatory system: Secondary | ICD-10-CM | POA: Insufficient documentation

## 2020-09-16 DIAGNOSIS — Z8719 Personal history of other diseases of the digestive system: Secondary | ICD-10-CM | POA: Insufficient documentation

## 2020-09-16 DIAGNOSIS — E785 Hyperlipidemia, unspecified: Secondary | ICD-10-CM | POA: Diagnosis not present

## 2020-09-16 DIAGNOSIS — Z79899 Other long term (current) drug therapy: Secondary | ICD-10-CM | POA: Insufficient documentation

## 2020-09-16 DIAGNOSIS — I509 Heart failure, unspecified: Secondary | ICD-10-CM | POA: Diagnosis not present

## 2020-09-16 DIAGNOSIS — C679 Malignant neoplasm of bladder, unspecified: Secondary | ICD-10-CM | POA: Diagnosis not present

## 2020-09-16 DIAGNOSIS — N3289 Other specified disorders of bladder: Secondary | ICD-10-CM | POA: Diagnosis not present

## 2020-09-16 DIAGNOSIS — C674 Malignant neoplasm of posterior wall of bladder: Secondary | ICD-10-CM | POA: Diagnosis not present

## 2020-09-16 DIAGNOSIS — I2510 Atherosclerotic heart disease of native coronary artery without angina pectoris: Secondary | ICD-10-CM | POA: Diagnosis not present

## 2020-09-16 DIAGNOSIS — E1122 Type 2 diabetes mellitus with diabetic chronic kidney disease: Secondary | ICD-10-CM | POA: Insufficient documentation

## 2020-09-16 DIAGNOSIS — I350 Nonrheumatic aortic (valve) stenosis: Secondary | ICD-10-CM | POA: Diagnosis not present

## 2020-09-16 DIAGNOSIS — Z87891 Personal history of nicotine dependence: Secondary | ICD-10-CM | POA: Diagnosis not present

## 2020-09-16 DIAGNOSIS — N183 Chronic kidney disease, stage 3 unspecified: Secondary | ICD-10-CM | POA: Diagnosis not present

## 2020-09-16 DIAGNOSIS — E1136 Type 2 diabetes mellitus with diabetic cataract: Secondary | ICD-10-CM | POA: Diagnosis not present

## 2020-09-16 DIAGNOSIS — Z85828 Personal history of other malignant neoplasm of skin: Secondary | ICD-10-CM | POA: Diagnosis not present

## 2020-09-16 DIAGNOSIS — Z809 Family history of malignant neoplasm, unspecified: Secondary | ICD-10-CM | POA: Diagnosis not present

## 2020-09-16 DIAGNOSIS — Z952 Presence of prosthetic heart valve: Secondary | ICD-10-CM | POA: Diagnosis not present

## 2020-09-16 DIAGNOSIS — I130 Hypertensive heart and chronic kidney disease with heart failure and stage 1 through stage 4 chronic kidney disease, or unspecified chronic kidney disease: Secondary | ICD-10-CM | POA: Diagnosis not present

## 2020-09-16 DIAGNOSIS — N35912 Unspecified bulbous urethral stricture, male: Secondary | ICD-10-CM | POA: Diagnosis not present

## 2020-09-16 DIAGNOSIS — I10 Essential (primary) hypertension: Secondary | ICD-10-CM | POA: Diagnosis not present

## 2020-09-16 DIAGNOSIS — I272 Pulmonary hypertension, unspecified: Secondary | ICD-10-CM | POA: Insufficient documentation

## 2020-09-16 DIAGNOSIS — Z01810 Encounter for preprocedural cardiovascular examination: Secondary | ICD-10-CM | POA: Diagnosis not present

## 2020-09-16 HISTORY — DX: Pulmonary hypertension, unspecified: I27.20

## 2020-09-16 HISTORY — DX: Chronic kidney disease, stage 3 unspecified: N18.30

## 2020-09-16 HISTORY — DX: Neoplasm of unspecified behavior of bladder: D49.4

## 2020-09-16 HISTORY — PX: TRANSURETHRAL RESECTION OF BLADDER TUMOR: SHX2575

## 2020-09-16 LAB — GLUCOSE, CAPILLARY
Glucose-Capillary: 149 mg/dL — ABNORMAL HIGH (ref 70–99)
Glucose-Capillary: 144 mg/dL — ABNORMAL HIGH (ref 70–99)

## 2020-09-16 SURGERY — TURBT (TRANSURETHRAL RESECTION OF BLADDER TUMOR)
Anesthesia: General

## 2020-09-16 MED ORDER — FENTANYL CITRATE (PF) 100 MCG/2ML IJ SOLN
INTRAMUSCULAR | Status: AC
  Filled 2020-09-16: qty 2

## 2020-09-16 MED ORDER — CHLORHEXIDINE GLUCONATE 0.12 % MT SOLN
OROMUCOSAL | Status: AC
  Administered 2020-09-16: 15 mL via OROMUCOSAL
  Filled 2020-09-16: qty 15

## 2020-09-16 MED ORDER — DEXAMETHASONE SODIUM PHOSPHATE 10 MG/ML IJ SOLN
INTRAMUSCULAR | Status: AC
  Filled 2020-09-16: qty 1

## 2020-09-16 MED ORDER — CHLORHEXIDINE GLUCONATE 0.12 % MT SOLN: 15.0000 mL | Freq: Once | OROMUCOSAL | Status: AC

## 2020-09-16 MED ORDER — FAMOTIDINE 20 MG PO TABS
ORAL_TABLET | ORAL | Status: AC
  Administered 2020-09-16: 20 mg via ORAL
  Filled 2020-09-16: qty 1

## 2020-09-16 MED ORDER — PROPOFOL 10 MG/ML IV BOLUS
INTRAVENOUS | Status: DC | PRN
  Administered 2020-09-16: 100 mg via INTRAVENOUS

## 2020-09-16 MED ORDER — ONDANSETRON HCL 4 MG/2ML IJ SOLN
INTRAMUSCULAR | Status: AC
  Filled 2020-09-16: qty 2

## 2020-09-16 MED ORDER — DEXAMETHASONE SODIUM PHOSPHATE 10 MG/ML IJ SOLN
INTRAMUSCULAR | Status: DC | PRN
  Administered 2020-09-16: 10 mg via INTRAVENOUS

## 2020-09-16 MED ORDER — PROPOFOL 10 MG/ML IV BOLUS
INTRAVENOUS | Status: AC
  Filled 2020-09-16: qty 20

## 2020-09-16 MED ORDER — CEFAZOLIN SODIUM-DEXTROSE 2-4 GM/100ML-% IV SOLN
INTRAVENOUS | Status: AC
  Filled 2020-09-16: qty 100

## 2020-09-16 MED ORDER — ONDANSETRON HCL 4 MG/2ML IJ SOLN
INTRAMUSCULAR | Status: DC | PRN
  Administered 2020-09-16: 4 mg via INTRAVENOUS

## 2020-09-16 MED ORDER — SODIUM CHLORIDE 0.9 % IR SOLN
Status: DC | PRN
Start: 2020-09-16 — End: 2020-09-16
  Administered 2020-09-16 (×2): 3000 mL

## 2020-09-16 MED ORDER — LIDOCAINE HCL (CARDIAC) PF 100 MG/5ML IV SOSY
PREFILLED_SYRINGE | INTRAVENOUS | Status: DC | PRN
  Administered 2020-09-16: 90 mg via INTRAVENOUS

## 2020-09-16 MED ORDER — ORAL CARE MOUTH RINSE: 15.0000 mL | Freq: Once | OROMUCOSAL | Status: AC

## 2020-09-16 MED ORDER — LIDOCAINE HCL (PF) 2 % IJ SOLN
INTRAMUSCULAR | Status: AC
  Filled 2020-09-16: qty 5

## 2020-09-16 MED ORDER — FENTANYL CITRATE (PF) 100 MCG/2ML IJ SOLN
INTRAMUSCULAR | Status: DC | PRN
  Administered 2020-09-16: 25 ug via INTRAVENOUS

## 2020-09-16 SURGICAL SUPPLY — 26 items
BAG DRAIN CYSTO-URO LG1000N (MISCELLANEOUS) ×2 IMPLANT
BAG DRN RND TRDRP ANRFLXCHMBR (UROLOGICAL SUPPLIES) ×1
BAG URINE DRAIN 2000ML AR STRL (UROLOGICAL SUPPLIES) ×2 IMPLANT
CATH FOLEY 2WAY 18X30 (CATHETERS) IMPLANT
CATH FOLEY 2WAY SIL 18X30 (CATHETERS)
DRAPE UTILITY 15X26 TOWEL STRL (DRAPES) ×2 IMPLANT
DRSG TELFA 4X3 1S NADH ST (GAUZE/BANDAGES/DRESSINGS) ×2 IMPLANT
ELECT LOOP 22F BIPOLAR SML (ELECTROSURGICAL) ×2
ELECT REM PT RETURN 9FT ADLT (ELECTROSURGICAL)
ELECTRODE LOOP 22F BIPOLAR SML (ELECTROSURGICAL) ×1 IMPLANT
ELECTRODE REM PT RTRN 9FT ADLT (ELECTROSURGICAL) IMPLANT
GAUZE 4X4 16PLY ~~LOC~~+RFID DBL (SPONGE) ×4 IMPLANT
GLOVE SURG UNDER POLY LF SZ7.5 (GLOVE) ×2 IMPLANT
GOWN STRL REUS W/ TWL LRG LVL3 (GOWN DISPOSABLE) ×1 IMPLANT
GOWN STRL REUS W/TWL LRG LVL3 (GOWN DISPOSABLE) ×2
GOWN STRL REUS W/TWL XL LVL4 (GOWN DISPOSABLE) ×2 IMPLANT
IV NS IRRIG 3000ML ARTHROMATIC (IV SOLUTION) ×4 IMPLANT
KIT TURNOVER CYSTO (KITS) ×2 IMPLANT
LOOP CUT BIPOLAR 24F LRG (ELECTROSURGICAL) IMPLANT
MANIFOLD NEPTUNE II (INSTRUMENTS) ×2 IMPLANT
PACK CYSTO AR (MISCELLANEOUS) ×2 IMPLANT
SET IRRIG Y TYPE TUR BLADDER L (SET/KITS/TRAYS/PACK) ×2 IMPLANT
SURGILUBE 2OZ TUBE FLIPTOP (MISCELLANEOUS) ×2 IMPLANT
SYR TOOMEY IRRIG 70ML (MISCELLANEOUS) ×2
SYRINGE TOOMEY IRRIG 70ML (MISCELLANEOUS) ×1 IMPLANT
WATER STERILE IRR 1000ML POUR (IV SOLUTION) ×2 IMPLANT

## 2020-09-16 NOTE — Transfer of Care (Signed)
Immediate Anesthesia Transfer of Care Note  Patient: AVARY PITSENBARGER  Procedure(s) Performed: TRANSURETHRAL RESECTION OF BLADDER TUMOR (TURBT)  Patient Location: PACU  Anesthesia Type:General  Level of Consciousness: awake and alert   Airway & Oxygen Therapy: Patient Spontanous Breathing and Patient connected to face mask oxygen  Post-op Assessment: Report given to RN and Post -op Vital signs reviewed and stable  Post vital signs: Reviewed and stable  Last Vitals:  Vitals Value Taken Time  BP 125/62 09/16/20 0942  Temp    Pulse 57 09/16/20 0945  Resp 11 09/16/20 0945  SpO2 98 % 09/16/20 0945  Vitals shown include unvalidated device data.  Last Pain:  Vitals:   09/16/20 0823  TempSrc: Temporal  PainSc: 0-No pain         Complications: No notable events documented.

## 2020-09-16 NOTE — Discharge Instructions (Addendum)
Transurethral Resection of Bladder Tumor (TURBT) or Bladder Biopsy   Definition:  Transurethral Resection of the Bladder Tumor is a surgical procedure used to diagnose and remove tumors within the bladder.   General instructions:     Your recent bladder surgery requires very little post hospital care but some definite precautions.  Despite the fact that no skin incisions were used, the area around the bladder incisions are raw and covered with scabs to promote healing and prevent bleeding. Certain precautions are needed to insure that the scabs are not disturbed over the next 2-4 weeks while the healing proceeds.  Because the raw surface inside your bladder and the irritating effects of urine you may expect frequency of urination and/or urgency (a stronger desire to urinate) and perhaps even getting up at night more often. This will usually resolve or improve slowly over the healing period. You may see some blood in your urine over the first 6 weeks. Do not be alarmed, even if the urine was clear for a while. Get off your feet and drink lots of fluids until clearing occurs. If you start to pass clots or don't improve call us.  Diet:  You may return to your normal diet immediately. Because of the raw surface of your bladder, alcohol, spicy foods, foods high in acid and drinks with caffeine may cause irritation or frequency and should be used in moderation. To keep your urine flowing freely and avoid constipation, drink plenty of fluids during the day (8-10 glasses). Tip: Avoid cranberry juice because it is very acidic.  Activity:  Your physical activity doesn't need to be restricted. However, if you are very active, you may see some blood in the urine. We suggest that you reduce your activity under the circumstances until the bleeding has stopped.  Bowels:  It is important to keep your bowels regular during the postoperative period. Straining with bowel movements can cause bleeding. A bowel  movement every other day is reasonable. Use a mild laxative if needed, such as milk of magnesia 2-3 tablespoons, or 2 Dulcolax tablets. Call if you continue to have problems. If you had been taking narcotics for pain, before, during or after your surgery, you may be constipated. Take a laxative if necessary.    Medication:  You may resume your at home medications.  Over-the-counter AZO for burning will help for urinary discomfort if needed  Follow-up: You will be contacted with your biopsy results and further follow-up recommendations at that time   [place], [address] [place], [place] [address] [phone]    [place]   The drugs that you were given will stay in your system until tomorrow so for the next 24 hours you should not:  Drive an automobile Make any legal decisions Drink any alcoholic beverage   You may resume regular meals tomorrow.  Today it is better to start with liquids and gradually work up to solid foods.  You may eat anything you prefer, but it is better to start with liquids, then soup and crackers, and gradually work up to solid foods.   Please notify your doctor immediately if you have any unusual bleeding, trouble breathing, redness and pain at the surgery site, drainage, fever, or pain not relieved by medication.    Additional Instructions:        Please contact your physician with any problems or Same Day Surgery at [phone], Monday through Friday 6 am to 4 pm, or [facility] number at [phone].

## 2020-09-16 NOTE — Anesthesia Preprocedure Evaluation (Addendum)
Anesthesia Evaluation  Patient identified by MRN, date of birth, ID band Patient awake    Reviewed: Allergy & Precautions, NPO status , Patient's Chart, lab work & pertinent test results  History of Anesthesia Complications Negative for: history of anesthetic complications  Airway Mallampati: II  TM Distance: >3 FB Neck ROM: Full    Dental  (+) Edentulous Upper, Missing, Poor Dentition Only has two teeth on bottom:   Pulmonary neg sleep apnea, neg COPD, former smoker,    breath sounds clear to auscultation- rhonchi (-) wheezing      Cardiovascular Exercise Tolerance: Good hypertension, + CAD, + CABG, + Peripheral Vascular Disease and +CHF  (-) Cardiac Stents + Valvular Problems/Murmurs (s/p AVR)  Rhythm:Regular Rate:Normal - Systolic murmurs and - Diastolic murmurs METS>4   Neuro/Psych neg Seizures negative neurological ROS  negative psych ROS   GI/Hepatic negative GI ROS, Neg liver ROS,   Endo/Other  diabetes, Oral Hypoglycemic Agents  Renal/GU Renal InsufficiencyRenal disease     Musculoskeletal  (+) Arthritis ,   Abdominal Normal abdominal exam  (+) - obese,   Peds negative pediatric ROS (+)  Hematology  (+) Blood dyscrasia, anemia ,   Anesthesia Other Findings Past Medical History: No date: Anemia No date: Anginal pain ([place]) No date: Aortic stenosis No date: [facility])     Comment:  bladder No date: Cataracts, bilateral No date: CHF (congestive heart failure) (HCC) No date: Chronic kidney disease No date: Coronary artery disease No date: Diabetes mellitus without complication (HCC)     Comment:  takes Janumet daily...dx approx. 2009 No date: Dyspnea No date: Dysrhythmia No date: Heart murmur No date: History of colon polyps     Comment:  benign No date: Hyperlipidemia     Comment:  takes Lovastatin daily No date: Hypertension     Comment:  takes Hyzaar and Amlodipine daily No date: Joint  pain No date: Medical history non-contributory No date: Muscle cramps     Comment:  occasionally in legs  No date: Osteoarthritis No date: Pre-diabetes 03/13/2018: Senile purpura (HCC) No date: Skin cancer of face No date: Urinary urgency   Reproductive/Obstetrics                             Anesthesia Physical  Anesthesia Plan  ASA: III  Anesthesia Plan: General   Post-op Pain Management:    Induction: Intravenous  PONV Risk Score and Plan: 1 and Ondansetron and Dexamethasone  Airway Management Planned: LMA  Additional Equipment:   Intra-op Plan:   Post-operative Plan:   Informed Consent: I have reviewed the patients History and Physical, chart, labs and discussed the procedure including the risks, benefits and alternatives for the proposed anesthesia with the patient or authorized representative who has indicated his/her understanding and acceptance.    Suspend DNR.   Dental advisory given  Plan Discussed with: CRNA and Anesthesiologist  Anesthesia Plan Comments:        Anesthesia Quick Evaluation

## 2020-09-16 NOTE — Op Note (Signed)
Preoperative diagnosis:  Bladder tumor  Postoperative diagnosis:  Bladder tumor  Procedure: Cystoscopy with bladder biopsies/fulguration  Surgeon: Abbie Sons, MD  Anesthesia: General  Complications: None  Intraoperative findings:  Cystoscopy-wide caliber bulbar stricture which did not impede passage of the 26 French resectoscope.  Mild to moderate lateral lobe enlargement with mild bladder neck elevation.  Left ureteral orifice normal-appearing with clear efflux.  Reimplanted right UO visualized right dome region with clear efflux Mid posterior wall <5 mm papillary tumor with mild mucosal papillary changes for a area of approximately 2 cm  EBL: Minimal  Specimens: Bladder biopsies  Indication: John [name] is a 80 y.o. patient with a history of recurrent low-grade urothelial carcinoma the bladder.  Found to have recurrent tumor on recent surveillance cystoscopy.  After reviewing the management options for treatment, he elected to proceed with the above surgical procedure(s). We have discussed the potential benefits and risks of the procedure, side effects of the proposed treatment, the likelihood of the patient achieving the goals of the procedure, and any potential problems that might occur during the procedure or recuperation. Informed consent has been obtained.  Description of procedure:  The patient was taken to the operating room and general anesthesia was induced.  The patient was placed in the dorsal lithotomy position, prepped and draped in the usual sterile fashion, and preoperative antibiotics were administered. A preoperative time-out was performed.   A 26 French resectoscope sheath with visual operator was lubricated, placed per urethra and advanced proximally under direct vision.  The [facility] resectoscope with loop was then placed into the sheath and panendoscopy was performed with findings as described above.  The resectoscope was exchanged for cold biopsy  forceps and the small papillary tumor was removed and 1 bite with additional bites taken of the papillary mucosal change.  The biopsy sites and area of papillary change were then fulgurated with the loop.  At the completion procedure no visible tumor was identified.  The bladder was emptied and the resectoscope was removed.  After anesthetic reversal he was transported to the PACU in stable condition  Plan: He will be contacted with the pathology results and further follow-up recommendations   Abbie Sons, M.D.

## 2020-09-16 NOTE — Anesthesia Postprocedure Evaluation (Signed)
Anesthesia Post Note  Patient: John [name]  Procedure(s) Performed: TRANSURETHRAL RESECTION OF BLADDER TUMOR (TURBT)  Patient location during evaluation: Radiology Anesthesia Type: General Level of consciousness: awake and alert Pain management: pain level controlled Vital Signs Assessment: post-procedure vital signs reviewed and stable Respiratory status: spontaneous breathing, nonlabored ventilation, respiratory function stable and patient connected to nasal cannula oxygen Cardiovascular status: blood pressure returned to baseline and stable Postop Assessment: no apparent nausea or vomiting Anesthetic complications: no   No notable events documented.   Last Vitals:  Vitals:   09/16/20 1000 09/16/20 1012  BP: 139/67 (!) 148/97  Pulse: (!) 59 66  Resp: 13 16  Temp: 36.5 C (!) 36.1 C  SpO2: 97% 95%    Last Pain:  Vitals:   09/16/20 1012  TempSrc: Temporal  PainSc: [place]

## 2020-09-16 NOTE — Interval H&P Note (Signed)
History and Physical Interval Note:  09/16/2020 8:59 AM  John [name]  has presented today for surgery, with the diagnosis of BLADDER TUMOR.  The various methods of treatment have been discussed with the patient and family. After consideration of risks, benefits and other options for treatment, the patient has consented to  Procedure(s): TRANSURETHRAL RESECTION OF BLADDER TUMOR (TURBT) (N/A) as a surgical intervention.  The patient's history has been reviewed, patient examined, no change in status, stable for surgery.  I have reviewed the patient's chart and labs.  Questions were answered to the patient's satisfaction.     [place]

## 2020-09-16 NOTE — H&P (Signed)
Urology H&P  Urologic history: 03/2013: distal right ureterectomy and reimplant for low grade UTUC 03/2014: TURBT, LGTa 04/2016: TURBT, PUNLMP 03/2018: TURBT, LGTa; post resection gemcitabine 04/2019: TURBT, LGTa; recommended 6-week course of intravesical chemotherapy however patient declined  History of Present Illness: John [name] is a 80 y.o. male who on recent surveillance cystoscopy found to have a broad area of papillary mucosal change on the posterior wall.  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   4v; LIMA-LAD, SVG-OM, SVG-RI, SVG-PDA   Hyperlipidemia    Hypertension    Muscle cramps    occasionally in legs    Osteoarthritis    Pulmonary hypertension (HCC)    mild   S/P AVR (aortic valve replacement) 05/05/2017   16mm Edwards Magna-Ease pericardial valve   Senile purpura ([place]) 03/13/2018   Skin cancer of face    T2DM (type 2 diabetes mellitus) ([place]) 2009    Past Surgical History:  Procedure Laterality Date   AORTIC VALVE REPLACEMENT N/A 05/05/2017   Procedure: AORTIC VALVE REPLACEMENT (AVR) USING MAGNA EASE PERICARDIAL BIOPROSTHESIS - AORTIC VALVE MODEL 3300TFX, SIZE 23 MM;  Surgeon: Gaye Pollack, MD;  Location: [place];  Service: Open Heart Surgery;  Laterality: N/A;   CATARACT EXTRACTION W/PHACO Right 07/21/2020   Procedure: CATARACT EXTRACTION PHACO AND INTRAOCULAR LENS PLACEMENT (IOC) RIGHT DIABETIC 9.09 00:51.3;  Surgeon: Eulogio Bear, MD;  Location: [place];  Service: Ophthalmology;  Laterality: Right;   CATARACT EXTRACTION W/PHACO Left 08/11/2020    Procedure: CATARACT EXTRACTION PHACO AND INTRAOCULAR LENS PLACEMENT ([place]) LEFT DIABETIC;  Surgeon: Eulogio Bear, MD;  Location: [place];  Service: Ophthalmology;  Laterality: Left;  5.08 0:37.2   COLONOSCOPY     CORONARY ARTERY BYPASS GRAFT N/A 05/05/2017   Procedure: CORONARY ARTERY BYPASS GRAFTING (CABG) x 4 WITH ENDOSCOPIC HARVESTING OF RIGHT SAPHENOUS VEIN (4v; LIMA-LAD, SVG-OM, SVG-RI, SVG-PDA);  Surgeon: Gaye Pollack, MD;  Location: [place];  Service: Open Heart Surgery;  Laterality: N/A;   CYSTOURETHROSCOPY     JOINT REPLACEMENT Left    hip   KNEE ARTHROPLASTY Right 01/12/2016   Procedure: RIGHT  TOTAL KNEE ARTHROPLASTY WITH COMPUTER NAVIGATION;  Surgeon: Rod Can, MD;  Location: [facility];  Service: Orthopedics;  Laterality: Right;  Needs RNFA   RIGHT/LEFT HEART CATH AND CORONARY ANGIOGRAPHY N/A 04/21/2017   Procedure: RIGHT/LEFT HEART CATH AND CORONARY ANGIOGRAPHY;  Surgeon: Isaias Cowman, MD;  Location: [facility];  Service: Cardiovascular;  Laterality: N/A;   SKIN SURGERY Left    arm   TEE WITHOUT CARDIOVERSION N/A 05/05/2017   Procedure: TRANSESOPHAGEAL ECHOCARDIOGRAM (TEE);  Surgeon: Gaye Pollack, MD;  Location: [place] [facility];  Service: Open Heart Surgery;  Laterality: N/A;   TRANSURETHRAL RESECTION OF BLADDER TUMOR N/A 05/15/2019   Procedure: TRANSURETHRAL RESECTION OF BLADDER TUMOR (TURBT);  Surgeon: Abbie Sons, MD;  Location: [facility];  Service: Urology;  Laterality: N/A;   TRANSURETHRAL RESECTION  OF BLADDER TUMOR WITH MITOMYCIN-C N/A 03/14/2018   Procedure: TRANSURETHRAL RESECTION OF BLADDER TUMOR WITH Gemcitabine;  Surgeon: Abbie Sons, MD;  Location: [facility];  Service: Urology;  Laterality: N/A;    Home Medications:  Current Meds  Medication Sig   Ascorbic Acid (VITAMIN C) 1000 MG tablet Take 1,000 mg by mouth daily.   atorvastatin (LIPITOR) 40 MG tablet TAKE 1 TABLET BY MOUTH ONCE DAILY AT 6 PM (Patient taking differently:  Take 40 mg by mouth daily. TAKE 1 TABLET BY MOUTH ONCE DAILY AT 6 PM)   furosemide (LASIX) 40 MG tablet TAKE ONE-HALF TABLET BY MOUTH ONCE DAILY (Patient taking differently: Take 20 mg by mouth daily. TAKE ONE-HALF TABLET BY MOUTH ONCE DAILY)   Iron, Ferrous Sulfate, 142 (45 Fe) MG TBCR Take 45 mg by mouth daily.   KLOR-CON M10 10 MEQ tablet Take 1 tablet (10 mEq total) by mouth daily.   losartan (COZAAR) 25 MG tablet Take 1/2 (one-half) tablet by mouth once daily (Patient taking differently: Take 12.5 mg by mouth daily. Take 1/2 (one-half) tablet by mouth once daily)   metoprolol tartrate (LOPRESSOR) 25 MG tablet Take 1 tablet (25 mg total) by mouth 2 (two) times daily.   SitaGLIPtin-MetFORMIN HCl (JANUMET XR) 50-1000 MG TB24 Take 1 tablet by mouth 2 (two) times daily.   [DISCONTINUED] aspirin 81 MG EC tablet Take 1 tablet (81 mg total) by mouth daily. (Patient not taking: Reported on 09/12/2020)   [DISCONTINUED] Cholecalciferol (VITAMIN D3) 50 MCG (2000 UT) TABS Take 2,000 Units by mouth daily.  (Patient not taking: Reported on 09/12/2020)   [DISCONTINUED] Cyanocobalamin 1000 MCG TBCR Take 1,000 mcg by mouth daily.  (Patient not taking: Reported on 09/12/2020)   [DISCONTINUED] naproxen sodium (ALEVE) 220 MG tablet Take 220 mg by mouth daily as needed (pain).  (Patient not taking: Reported on 09/12/2020)    Allergies: No Known Allergies  Family History  Problem Relation Age of Onset   Dementia Mother    Stroke Mother    Stroke Father    Diabetes Father    Heart disease Father    Cancer Sister    Hypertension Brother    Cancer Sister     Social History:  reports that he quit smoking about 39 years ago. His smoking use included cigarettes. He has never used smokeless tobacco. He reports that he does not drink alcohol and does not use drugs.  ROS: A complete review of systems was performed.  All systems are negative except for pertinent findings as noted.  Physical Exam:  Vital signs in  last 24 hours:   Constitutional:  Alert and oriented, No acute distress HEENT: [place] AT, moist mucus membranes.  Trachea midline, no masses Cardiovascular: Regular rate and rhythm, no clubbing, cyanosis, or edema. Respiratory: Normal respiratory effort, lungs clear bilaterally   Laboratory Data:  No results for input(s): WBC, HGB, HCT in the last 72 hours. No results for input(s): NA, K, CL, CO2, GLUCOSE, BUN, CREATININE, CALCIUM in the last 72 hours. No results for input(s): LABPT, INR in the last 72 hours. No results for input(s): LABURIN in the last 72 hours. Results for orders placed or performed in visit on 09/04/20  CULTURE, URINE COMPREHENSIVE     Status: None   Collection Time: 09/04/20 10:57 AM   Specimen: Urine   UR  Result Value Ref Range Status   Urine Culture, Comprehensive Final report  Final   Organism ID, Bacteria Comment  Final  Comment: No growth in 36 - 48 hours.     Radiologic Imaging: No results found.  Impression/Assessment:  80 y.o. male with findings on recent surveillance cystoscopy consistent with early recurrent urothelial carcinoma  Plan:  Cystoscopy with bladder biopsy/fulguration; possible TURBT   09/16/2020, 7:26 AM  John Giovanni,  MD

## 2020-09-16 NOTE — Anesthesia Procedure Notes (Signed)
Procedure Name: LMA Insertion Date/Time: 09/16/2020 9:17 AM Performed by: Rona Ravens, CRNA Pre-anesthesia Checklist: Patient identified, Patient being monitored, Timeout performed, Emergency Drugs available and Suction available Patient Re-evaluated:Patient Re-evaluated prior to induction Oxygen Delivery Method: Circle system utilized Preoxygenation: Pre-oxygenation with 100% oxygen Induction Type: IV induction Ventilation: Mask ventilation without difficulty LMA: LMA inserted LMA Size: 4.5 Tube type: Oral Number of attempts: 1 Placement Confirmation: positive ETCO2 and breath sounds checked- equal and bilateral Tube secured with: Tape Dental Injury: Teeth and Oropharynx as per pre-operative assessment

## 2020-09-17 LAB — SURGICAL PATHOLOGY

## 2020-09-19 ENCOUNTER — Telehealth: Payer: Self-pay | Admitting: Urology

## 2020-09-19 NOTE — Telephone Encounter (Signed)
I spoke with Ms. John [name] regarding John [name]'s bladder pathology report.  She is on his DPR.  He was asleep when I called.  Path report was a low-grade, noninvasive urothelial carcinoma the bladder.  We had previously discussed 6-week intravesical chemotherapy however he declined and she states he had also indicated he did not want desire intravesical chemotherapy with this tumor.  I recommended a 71-monthsurveillance cystoscopy and if he changes his mind and desires to discuss intravesical chemotherapy they will call for a postoperative appointment to discuss further.

## 2020-09-25 NOTE — Telephone Encounter (Signed)
App made ° ° °Michelle  °

## 2020-10-15 DIAGNOSIS — I10 Essential (primary) hypertension: Secondary | ICD-10-CM | POA: Diagnosis not present

## 2020-10-15 DIAGNOSIS — Z952 Presence of prosthetic heart valve: Secondary | ICD-10-CM | POA: Diagnosis not present

## 2020-10-15 DIAGNOSIS — I350 Nonrheumatic aortic (valve) stenosis: Secondary | ICD-10-CM | POA: Diagnosis not present

## 2020-10-15 DIAGNOSIS — E785 Hyperlipidemia, unspecified: Secondary | ICD-10-CM | POA: Diagnosis not present

## 2020-10-15 DIAGNOSIS — Z951 Presence of aortocoronary bypass graft: Secondary | ICD-10-CM | POA: Diagnosis not present

## 2020-11-26 ENCOUNTER — Ambulatory Visit: Payer: PPO

## 2020-12-01 ENCOUNTER — Ambulatory Visit (INDEPENDENT_AMBULATORY_CARE_PROVIDER_SITE_OTHER): Payer: PPO

## 2020-12-01 VITALS — Ht 72.0 in | Wt 208.0 lb

## 2020-12-01 DIAGNOSIS — Z0000 Encounter for general adult medical examination without abnormal findings: Secondary | ICD-10-CM

## 2020-12-01 NOTE — Patient Instructions (Signed)
John [name] , Thank you for taking time to come for your Medicare Wellness Visit. I appreciate your ongoing commitment to your health goals. Please review the following plan we discussed and let me know if I can assist you in the future.   Screening recommendations/referrals: Colonoscopy: not required Recommended yearly ophthalmology/optometry visit for glaucoma screening and checkup Recommended yearly dental visit for hygiene and checkup  Vaccinations: Influenza vaccine: due Pneumococcal vaccine: completed 05/24/2014 Tdap vaccine: due Shingles vaccine: discussed   Covid-19:  12/20/2019, 04/02/2019, 03/12/2019  Advanced directives: copy in chart  Conditions/risks identified: none  Next appointment: Follow up in one year for your annual wellness visit.   Preventive Care 25 Years and Older, Male Preventive care refers to lifestyle choices and visits with your health care provider that can promote health and wellness. What does preventive care include? A yearly physical exam. This is also called an annual well check. Dental exams once or twice a year. Routine eye exams. Ask your health care provider how often you should have your eyes checked. Personal lifestyle choices, including: Daily care of your teeth and gums. Regular physical activity. Eating a healthy diet. Avoiding tobacco and drug use. Limiting alcohol use. Practicing safe sex. Taking low doses of aspirin every day. Taking vitamin and mineral supplements as recommended by your health care provider. What happens during an annual well check? The services and screenings done by your health care provider during your annual well check will depend on your age, overall health, lifestyle risk factors, and family history of disease. Counseling  Your health care provider may ask you questions about your: Alcohol use. Tobacco use. Drug use. Emotional well-being. Home and relationship well-being. Sexual activity. Eating  habits. History of falls. Memory and ability to understand (cognition). Work and work Statistician. Screening  You may have the following tests or measurements: Height, weight, and BMI. Blood pressure. Lipid and cholesterol levels. These may be checked every 5 years, or more frequently if you are over 18 years old. Skin check. Lung cancer screening. You may have this screening every year starting at age 5 if you have a 30-pack-year history of smoking and currently smoke or have quit within the past 15 years. Fecal occult blood test (FOBT) of the stool. You may have this test every year starting at age 79. Flexible sigmoidoscopy or colonoscopy. You may have a sigmoidoscopy every 5 years or a colonoscopy every 10 years starting at age 58. Prostate cancer screening. Recommendations will vary depending on your family history and other risks. Hepatitis C blood test. Hepatitis B blood test. Sexually transmitted disease (STD) testing. Diabetes screening. This is done by checking your blood sugar (glucose) after you have not eaten for a while (fasting). You may have this done every 1-3 years. Abdominal aortic aneurysm (AAA) screening. You may need this if you are a current or former smoker. Osteoporosis. You may be screened starting at age 58 if you are at high risk. Talk with your health care provider about your test results, treatment options, and if necessary, the need for more tests. Vaccines  Your health care provider may recommend certain vaccines, such as: Influenza vaccine. This is recommended every year. Tetanus, diphtheria, and acellular pertussis (Tdap, Td) vaccine. You may need a Td booster every 10 years. Zoster vaccine. You may need this after age 26. Pneumococcal 13-valent conjugate (PCV13) vaccine. One dose is recommended after age 61. Pneumococcal polysaccharide (PPSV23) vaccine. One dose is recommended after age 82. Talk to your health care  provider about which screenings and  vaccines you need and how often you need them. This information is not intended to replace advice given to you by your health care provider. Make sure you discuss any questions you have with your health care provider. Document Released: 03/14/2015 Document Revised: 11/05/2015 Document Reviewed: 12/17/2014 Elsevier Interactive Patient Education  2017 [place] Prevention in the Home Falls can cause injuries. They can happen to people of all ages. There are many things you can do to make your home safe and to help prevent falls. What can I do on the outside of my home? Regularly fix the edges of walkways and driveways and fix any cracks. Remove anything that might make you trip as you walk through a door, such as a raised step or threshold. Trim any bushes or trees on the path to your home. Use bright outdoor lighting. Clear any walking paths of anything that might make someone trip, such as rocks or tools. Regularly check to see if handrails are loose or broken. Make sure that both sides of any steps have handrails. Any raised decks and porches should have guardrails on the edges. Have any leaves, snow, or ice cleared regularly. Use sand or salt on walking paths during winter. Clean up any spills in your garage right away. This includes oil or grease spills. What can I do in the bathroom? Use night lights. Install grab bars by the toilet and in the tub and shower. Do not use towel bars as grab bars. Use non-skid mats or decals in the tub or shower. If you need to sit down in the shower, use a plastic, non-slip stool. Keep the floor dry. Clean up any water that spills on the floor as soon as it happens. Remove soap buildup in the tub or shower regularly. Attach bath mats securely with double-sided non-slip rug tape. Do not have throw rugs and other things on the floor that can make you trip. What can I do in the bedroom? Use night lights. Make sure that you have a light by your  bed that is easy to reach. Do not use any sheets or blankets that are too big for your bed. They should not hang down onto the floor. Have a firm chair that has side arms. You can use this for support while you get dressed. Do not have throw rugs and other things on the floor that can make you trip. What can I do in the kitchen? Clean up any spills right away. Avoid walking on wet floors. Keep items that you use a lot in easy-to-reach places. If you need to reach something above you, use a strong step stool that has a grab bar. Keep electrical cords out of the way. Do not use floor polish or wax that makes floors slippery. If you must use wax, use non-skid floor wax. Do not have throw rugs and other things on the floor that can make you trip. What can I do with my stairs? Do not leave any items on the stairs. Make sure that there are handrails on both sides of the stairs and use them. Fix handrails that are broken or loose. Make sure that handrails are as long as the stairways. Check any carpeting to make sure that it is firmly attached to the stairs. Fix any carpet that is loose or worn. Avoid having throw rugs at the top or bottom of the stairs. If you do have throw rugs, attach them to the  floor with carpet tape. Make sure that you have a light switch at the top of the stairs and the bottom of the stairs. If you do not have them, ask someone to add them for you. What else can I do to help prevent falls? Wear shoes that: Do not have high heels. Have rubber bottoms. Are comfortable and fit you well. Are closed at the toe. Do not wear sandals. If you use a stepladder: Make sure that it is fully opened. Do not climb a closed stepladder. Make sure that both sides of the stepladder are locked into place. Ask someone to hold it for you, if possible. Clearly mark and make sure that you can see: Any grab bars or handrails. First and last steps. Where the edge of each step is. Use tools that  help you move around (mobility aids) if they are needed. These include: Canes. Walkers. Scooters. Crutches. Turn on the lights when you go into a dark area. Replace any light bulbs as soon as they burn out. Set up your furniture so you have a clear path. Avoid moving your furniture around. If any of your floors are uneven, fix them. If there are any pets around you, be aware of where they are. Review your medicines with your doctor. Some medicines can make you feel dizzy. This can increase your chance of falling. Ask your doctor what other things that you can do to help prevent falls. This information is not intended to replace advice given to you by your health care provider. Make sure you discuss any questions you have with your health care provider. Document Released: 12/12/2008 Document Revised: 07/24/2015 Document Reviewed: 03/22/2014 Elsevier Interactive Patient Education  2017 [employer].

## 2020-12-01 NOTE — Progress Notes (Signed)
I connected with [name] today by telephone and verified that I am speaking with the correct person using two identifiers. Location patient: home Location provider: work Persons participating in the virtual visit: Oseph Imburgia, Glenna Durand LPN.   I discussed the limitations, risks, security and privacy concerns of performing an evaluation and management service by telephone and the availability of in person appointments. I also discussed with the patient that there may be a patient responsible charge related to this service. The patient expressed understanding and verbally consented to this telephonic visit.    Interactive audio and video telecommunications were attempted between this provider and patient, however failed, due to patient having technical difficulties OR patient did not have access to video capability.  We continued and completed visit with audio only.     Vital signs may be patient reported or missing.  Subjective:   John [name] is a 80 y.o. male who presents for Medicare Annual/Subsequent preventive examination.  Review of Systems     Cardiac Risk Factors include: advanced age (>55men, >45 women);diabetes mellitus;dyslipidemia;hypertension;male gender     Objective:    Today's Vitals   12/01/20 1256  Weight: 208 lb (94.3 kg)  Height: 6' (1.829 m)   Body mass index is 28.21 kg/m.  Advanced Directives 12/01/2020 09/16/2020 09/04/2020 08/11/2020 07/21/2020 11/26/2019 05/15/2019  Does Patient Have a Medical Advance Directive? Yes Yes Yes Yes No Yes Yes  Type of Advance Directive Out of facility DNR (pink MOST or yellow form) (No Data) (No Data) [place];Living will - Out of facility DNR (pink MOST or yellow form) [place];Living will  Does patient want to make changes to medical advance directive? - No - Patient declined - No - Patient declined - - No - Patient declined  Copy of [place] in Chart? - - -  Yes - validated most recent copy scanned in chart (See row information) - - No - copy requested  Would patient like information on creating a medical advance directive? - - - - No - Patient declined - -    Current Medications (verified) Outpatient Encounter Medications as of 12/01/2020  Medication Sig   Ascorbic Acid (VITAMIN C) 1000 MG tablet Take 1,000 mg by mouth daily.   aspirin EC 81 MG tablet Take 81 mg by mouth daily. Swallow whole.   atorvastatin (LIPITOR) 40 MG tablet TAKE 1 TABLET BY MOUTH ONCE DAILY AT 6 PM (Patient taking differently: Take 40 mg by mouth daily. TAKE 1 TABLET BY MOUTH ONCE DAILY AT 6 PM)   furosemide (LASIX) 40 MG tablet TAKE ONE-HALF TABLET BY MOUTH ONCE DAILY (Patient taking differently: Take 20 mg by mouth daily. TAKE ONE-HALF TABLET BY MOUTH ONCE DAILY)   Iron, Ferrous Sulfate, 142 (45 Fe) MG TBCR Take 45 mg by mouth daily.   KLOR-CON M10 10 MEQ tablet Take 1 tablet (10 mEq total) by mouth daily.   losartan (COZAAR) 25 MG tablet Take 1/2 (one-half) tablet by mouth once daily (Patient taking differently: Take 12.5 mg by mouth daily. Take 1/2 (one-half) tablet by mouth once daily)   metoprolol tartrate (LOPRESSOR) 25 MG tablet Take 1 tablet (25 mg total) by mouth 2 (two) times daily.   SitaGLIPtin-MetFORMIN HCl (JANUMET XR) 50-1000 MG TB24 Take 1 tablet by mouth 2 (two) times daily.   No facility-administered encounter medications on file as of 12/01/2020.    Allergies (verified) Patient has no known allergies.   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   67mm Edwards Magna-Ease pericardial valve   Senile purpura ([place]) 03/13/2018   Skin cancer of face    T2DM (type 2 diabetes mellitus) ([place]) 2009   Past Surgical History:  Procedure Laterality Date   AORTIC VALVE REPLACEMENT N/A 05/05/2017   Procedure: AORTIC VALVE REPLACEMENT (AVR) USING MAGNA EASE PERICARDIAL BIOPROSTHESIS - AORTIC VALVE MODEL 3300TFX, SIZE 23 MM;  Surgeon: Gaye Pollack, MD;  Location: [facility];  Service: Open Heart Surgery;  Laterality: N/A;   CATARACT EXTRACTION W/PHACO Right 07/21/2020   Procedure: CATARACT EXTRACTION PHACO AND INTRAOCULAR LENS PLACEMENT (IOC) RIGHT DIABETIC 9.09 00:51.3;  Surgeon: Eulogio Bear, MD;  Location: [facility];  Service: Ophthalmology;  Laterality: Right;   CATARACT EXTRACTION W/PHACO Left 08/11/2020   Procedure: CATARACT EXTRACTION PHACO AND INTRAOCULAR LENS PLACEMENT ([place]) LEFT DIABETIC;  Surgeon: Eulogio Bear, MD;  Location: [facility];  Service: Ophthalmology;  Laterality: Left;  5.08 0:37.2   COLONOSCOPY     CORONARY ARTERY BYPASS GRAFT N/A 05/05/2017   Procedure: CORONARY ARTERY BYPASS GRAFTING (CABG) x 4 WITH ENDOSCOPIC HARVESTING OF RIGHT SAPHENOUS VEIN (4v; LIMA-LAD, SVG-OM, SVG-RI, SVG-PDA);  Surgeon: Gaye Pollack, MD;  Location: [facility];  Service: Open Heart Surgery;  Laterality: N/A;   CYSTOURETHROSCOPY     JOINT REPLACEMENT Left    hip   KNEE ARTHROPLASTY Right 01/12/2016   Procedure: RIGHT  TOTAL KNEE ARTHROPLASTY WITH COMPUTER NAVIGATION;  Surgeon: Rod Can, MD;  Location: [facility];  Service: Orthopedics;  Laterality: Right;  Needs RNFA   RIGHT/LEFT HEART CATH AND CORONARY ANGIOGRAPHY N/A 04/21/2017   Procedure: RIGHT/LEFT HEART CATH AND CORONARY ANGIOGRAPHY;  Surgeon: Isaias Cowman, MD;  Location: [facility];  Service:  Cardiovascular;  Laterality: N/A;   SKIN SURGERY Left    arm   TEE WITHOUT CARDIOVERSION N/A 05/05/2017   Procedure: TRANSESOPHAGEAL ECHOCARDIOGRAM (TEE);  Surgeon: Gaye Pollack, MD;  Location: [facility];  Service: Open Heart Surgery;  Laterality: N/A;   TRANSURETHRAL RESECTION OF BLADDER TUMOR N/A 05/15/2019   Procedure: TRANSURETHRAL RESECTION OF BLADDER TUMOR (TURBT);  Surgeon: Abbie Sons, MD;  Location: [facility];  Service: Urology;  Laterality: N/A;   TRANSURETHRAL RESECTION OF BLADDER TUMOR N/A 09/16/2020   Procedure: TRANSURETHRAL RESECTION OF BLADDER TUMOR (TURBT);  Surgeon: Abbie Sons, MD;  Location: [facility];  Service: Urology;  Laterality: N/A;   TRANSURETHRAL RESECTION OF BLADDER TUMOR WITH MITOMYCIN-C N/A 03/14/2018   Procedure: TRANSURETHRAL RESECTION OF BLADDER TUMOR WITH Gemcitabine;  Surgeon: Abbie Sons, MD;  Location: [facility];  Service: Urology;  Laterality: N/A;   Family History  Problem Relation Age of Onset   Dementia Mother    Stroke Mother    Stroke Father  Diabetes Father    Heart disease Father    Cancer Sister    Hypertension Brother    Cancer Sister    Social History   Socioeconomic History   Marital status: Married    Spouse name: Not on file   Number of children: Not on file   Years of education: Not on file   Highest education level: 10th grade  Occupational History   Occupation: retired  Tobacco Use   Smoking status: Former    Years: 25.00    Types: Cigarettes    Quit date: 12/15/1980    Years since quitting: 39.9   Smokeless tobacco: Never  Vaping Use   Vaping Use: Never used  Substance and Sexual Activity   Alcohol use: No    Alcohol/week: 0.0 standard drinks   Drug use: No   Sexual activity: Yes  Other Topics Concern   Not on file  Social History Narrative   Not on file   Social Determinants of Health   Financial Resource Strain: Low Risk    Difficulty of Paying Living Expenses: Not hard at all  Food  Insecurity: No Food Insecurity   Worried About Charity fundraiser in the Last Year: Never true   [place] in the Last Year: Never true  Transportation Needs: No Transportation Needs   Lack of Transportation (Medical): No   Lack of Transportation (Non-Medical): No  Physical Activity: Inactive   Days of Exercise per Week: 0 days   Minutes of Exercise per Session: 0 min  Stress: No Stress Concern Present   Feeling of Stress : Not at all  Social Connections: Not on file    Tobacco Counseling Counseling given: Not Answered   Clinical Intake:  Pre-visit preparation completed: Yes  Pain : 0-10 Pain Location: Back Pain Orientation: Lower Pain Radiating Towards: down leg Pain Descriptors / Indicators: Shooting Pain Onset: In the past 7 days Pain Frequency: Intermittent     Nutritional Status: BMI 25 -29 Overweight Nutritional Risks: None Diabetes: Yes  How often do you need to have someone help you when you read instructions, pamphlets, or other written materials from your doctor or pharmacy?: 1 - Never  Diabetic? Yes Nutrition Risk Assessment:  Has the patient had any N/V/D within the last 2 months?  No  Does the patient have any non-healing wounds?  No  Has the patient had any unintentional weight loss or weight gain?  No   Diabetes:  Is the patient diabetic?  Yes  If diabetic, was a CBG obtained today?  No  Did the patient bring in their glucometer from home?  No  How often do you monitor your CBG's? Does not.   Financial Strains and Diabetes Management:  Are you having any financial strains with the device, your supplies or your medication? No .  Does the patient want to be seen by Chronic Care Management for management of their diabetes?  No  Would the patient like to be referred to a Nutritionist or for Diabetic Management?  No   Diabetic Exams:  Diabetic Eye Exam: Completed 06/19/2020 Diabetic Foot Exam: Completed 12/10/2019   Interpreter Needed?:  No  Information entered by :: NAllen LPN   Activities of Daily Living In your present state of health, do you have any difficulty performing the following activities: 12/01/2020 09/04/2020  Hearing? Tempie Donning  Vision? N N  Difficulty concentrating or making decisions? N N  Walking or climbing stairs? N N  Dressing or  bathing? N N  Doing errands, shopping? N N  Preparing Food and eating ? N -  Using the Toilet? N -  In the past six months, have you accidently leaked urine? N -  Do you have problems with loss of bowel control? N -  Managing your Medications? N -  Managing your Finances? N -  Housekeeping or managing your Housekeeping? N -  Some recent data might be hidden    Patient Care Team: Venita Lick, NP as PCP - General (Nurse Practitioner) Rod Can, MD as Consulting Physician (Orthopedic Surgery) Isaias Cowman, MD as Consulting Physician (Cardiology) Gaye Pollack, MD as Consulting Physician (Cardiothoracic Surgery) Jannet Mantis, MD (Dermatology) Minor, Dalbert Garnet, RN (Inactive) as [place] any recent Medical Services you may have received from other than [facility] providers in the past year (date may be approximate).     Assessment:   This is a routine wellness examination for [place].  Hearing/Vision screen Vision Screening - Comments:: Regular eye exams, [facility]  Dietary issues and exercise activities discussed: Current Exercise Habits: The patient does not participate in regular exercise at present   Goals Addressed             This Visit's Progress    Patient Stated       12/01/2020, no goals       Depression Screen PHQ 2/9 Scores 12/01/2020 03/12/2020 11/26/2019 02/28/2019 11/15/2018 11/15/2018 11/03/2017  PHQ - 2 Score 0 0 0 0 0 0 0  PHQ- 9 Score - 0 - - - - -    Fall Risk Fall Risk  12/01/2020 03/12/2020 11/26/2019 09/25/2019 11/15/2018  Falls in the past year? 1 0 0 0 0  Comment fell in  bathtub - - Emmi Telephone Survey: data to providers prior to load -  Number falls in past yr: 0 - - - -  Injury with Fall? 1 - - - -  Risk for fall due to : Medication side effect;Impaired mobility - Impaired balance/gait;Medication side effect - -  Follow up Falls evaluation completed;Education provided;Falls prevention discussed - Falls evaluation completed;Education provided;Falls prevention discussed - -    FALL RISK PREVENTION PERTAINING TO THE HOME:  Any stairs in or around the home? No  If so, are there any without handrails? N/a Home free of loose throw rugs in walkways, pet beds, electrical cords, etc? No  Adequate lighting in your home to reduce risk of falls? No   ASSISTIVE DEVICES UTILIZED TO PREVENT FALLS:  Life alert? No  Use of a cane, walker or w/c? No  Grab bars in the bathroom? No  Shower chair or bench in shower? No  Elevated toilet seat or a handicapped toilet? Yes   TIMED UP AND GO:  Was the test performed? No .      Cognitive Function:     6CIT Screen 12/01/2020 11/26/2019 02/28/2019 11/03/2017 11/03/2016  What Year? 0 points 0 points 0 points 0 points 0 points  What month? 0 points 0 points 0 points 0 points 0 points  What time? 0 points 0 points 0 points 0 points 0 points  Count back from 20 0 points 0 points 0 points 0 points 0 points  Months in reverse 0 points 0 points 0 points 0 points 0 points  Repeat phrase 0 points 0 points 0 points 0 points 2 points  Total Score 0 0 0 0 2    Immunizations Immunization History  Administered Date(s) Administered   Fluad Quad(high Dose 65+) 12/13/2018, 12/10/2019   Influenza, High Dose Seasonal PF 11/25/2015, 11/03/2016, 11/03/2017   Influenza,inj,Quad PF,6+ Mos 11/18/2014   PFIZER(Purple Top)SARS-COV-2 Vaccination 03/12/2019, 04/02/2019, 12/20/2019   Pneumococcal Conjugate-13 11/23/2013   Pneumococcal Polysaccharide-23 05/24/2014   Zoster, Live 05/19/1957    TDAP status: Due, Education has been provided  regarding the importance of this vaccine. Advised may receive this vaccine at local pharmacy or Health Dept. Aware to provide a copy of the vaccination record if obtained from local pharmacy or Health Dept. Verbalized acceptance and understanding.  Flu Vaccine status: Due, Education has been provided regarding the importance of this vaccine. Advised may receive this vaccine at local pharmacy or Health Dept. Aware to provide a copy of the vaccination record if obtained from local pharmacy or Health Dept. Verbalized acceptance and understanding.  Pneumococcal vaccine status: Up to date  Covid-19 vaccine status: Completed vaccines  Qualifies for Shingles Vaccine? Yes   Zostavax completed No   Shingrix Completed?: No.    Education has been provided regarding the importance of this vaccine. Patient has been advised to call insurance company to determine out of pocket expense if they have not yet received this vaccine. Advised may also receive vaccine at local pharmacy or Health Dept. Verbalized acceptance and understanding.  Screening Tests Health Maintenance  Topic Date Due   COVID-19 Vaccine (4 - Booster for Pfizer series) 03/13/2020   INFLUENZA VACCINE  09/29/2020   Zoster Vaccines- Shingrix (1 of 2) 12/13/2020 (Originally 03/25/1959)   TETANUS/TDAP  09/12/2021 (Originally 03/25/1959)   FOOT EXAM  12/09/2020   HEMOGLOBIN A1C  03/15/2021   OPHTHALMOLOGY EXAM  06/19/2021   HPV VACCINES  Aged Out   COLONOSCOPY (Pts 45-87yrs Insurance coverage will need to be confirmed)  Discontinued    Health Maintenance  Health Maintenance Due  Topic Date Due   COVID-19 Vaccine (4 - Booster for [place] series) 03/13/2020   INFLUENZA VACCINE  09/29/2020    Colorectal cancer screening: No longer required.   Lung Cancer Screening: (Low Dose CT Chest recommended if Age 21-80 years, 30 pack-year currently smoking OR have quit w/in 15years.) does not qualify.   Lung Cancer Screening Referral:  no  Additional Screening:  Hepatitis C Screening: does not qualify;   Vision Screening: Recommended annual ophthalmology exams for early detection of glaucoma and other disorders of the eye. Is the patient up to date with their annual eye exam?  Yes  Who is the provider or what is the name of the office in which the patient attends annual eye exams? [facility] If pt is not established with a provider, would they like to be referred to a provider to establish care? No .   Dental Screening: Recommended annual dental exams for proper oral hygiene  Community Resource Referral / Chronic Care Management: CRR required this visit?  No   CCM required this visit?  No      Plan:     I have personally reviewed and noted the following in the patient's chart:   Medical and social history Use of alcohol, tobacco or illicit drugs  Current medications and supplements including opioid prescriptions. Patient is not currently taking opioid prescriptions. Functional ability and status Nutritional status Physical activity Advanced directives List of other physicians Hospitalizations, surgeries, and ER visits in previous 12 months Vitals Screenings to include cognitive, depression, and falls Referrals and appointments  In addition, I have reviewed and discussed with patient certain preventive protocols,  quality metrics, and best practice recommendations. A written personalized care plan for preventive services as well as general preventive health recommendations were provided to patient.     Kellie Simmering, LPN   17/10/8100   Nurse Notes:

## 2020-12-15 ENCOUNTER — Other Ambulatory Visit: Payer: Self-pay

## 2020-12-15 ENCOUNTER — Ambulatory Visit: Payer: PPO | Admitting: Nurse Practitioner

## 2020-12-15 ENCOUNTER — Ambulatory Visit (INDEPENDENT_AMBULATORY_CARE_PROVIDER_SITE_OTHER): Payer: PPO

## 2020-12-15 DIAGNOSIS — D508 Other iron deficiency anemias: Secondary | ICD-10-CM

## 2020-12-15 DIAGNOSIS — E1169 Type 2 diabetes mellitus with other specified complication: Secondary | ICD-10-CM

## 2020-12-15 DIAGNOSIS — Z23 Encounter for immunization: Secondary | ICD-10-CM | POA: Diagnosis not present

## 2020-12-15 DIAGNOSIS — E538 Deficiency of other specified B group vitamins: Secondary | ICD-10-CM

## 2020-12-15 DIAGNOSIS — N1831 Chronic kidney disease, stage 3a: Secondary | ICD-10-CM

## 2020-12-15 DIAGNOSIS — I130 Hypertensive heart and chronic kidney disease with heart failure and stage 1 through stage 4 chronic kidney disease, or unspecified chronic kidney disease: Secondary | ICD-10-CM

## 2020-12-26 ENCOUNTER — Encounter: Payer: Self-pay | Admitting: Urology

## 2020-12-26 ENCOUNTER — Ambulatory Visit: Payer: PPO | Admitting: Urology

## 2020-12-26 ENCOUNTER — Other Ambulatory Visit: Payer: Self-pay

## 2020-12-26 VITALS — BP 154/69 | HR 73 | Ht 72.0 in | Wt 203.0 lb

## 2020-12-26 DIAGNOSIS — C679 Malignant neoplasm of bladder, unspecified: Secondary | ICD-10-CM | POA: Diagnosis not present

## 2020-12-26 LAB — MICROSCOPIC EXAMINATION
Bacteria, UA: NONE SEEN
RBC, Urine: >30 /hpf — AB (ref 0–2)

## 2020-12-26 LAB — URINALYSIS, COMPLETE
Bilirubin, UA: NEGATIVE
Glucose, UA: NEGATIVE
Ketones, UA: NEGATIVE
Leukocytes,UA: NEGATIVE
Nitrite, UA: NEGATIVE
Protein,UA: NEGATIVE
Specific Gravity, UA: 1.015 (ref 1.005–1.030)
Urobilinogen, Ur: 0.2 mg/dL (ref 0.2–1.0)
pH, UA: 5.5 (ref 5.0–7.5)

## 2020-12-26 MED ORDER — SULFAMETHOXAZOLE-TRIMETHOPRIM 800-160 MG PO TABS
1.0000 | ORAL_TABLET | Freq: Once | ORAL | Status: AC
  Administered 2020-12-26: 1 via ORAL

## 2020-12-26 NOTE — Progress Notes (Signed)
   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 male presents for 59-month surveillance cystoscopy  Refer to rooming tab for vitals NED. A&Ox3.   No respiratory distress   Abd soft, NT, ND Normal phallus with bilateral descended testicles  Cystoscopy Procedure Note  Patient identification was confirmed, informed consent was obtained, and patient was prepped using Betadine solution.  Lidocaine jelly was administered per urethral meatus.     Pre-Procedure: - Inspection reveals a normal caliber urethral meatus.  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

## 2020-12-28 ENCOUNTER — Encounter: Payer: Self-pay | Admitting: Urology

## 2021-01-15 ENCOUNTER — Encounter: Payer: Self-pay | Admitting: Nurse Practitioner

## 2021-01-15 ENCOUNTER — Ambulatory Visit (INDEPENDENT_AMBULATORY_CARE_PROVIDER_SITE_OTHER): Payer: PPO | Admitting: Nurse Practitioner

## 2021-01-15 ENCOUNTER — Other Ambulatory Visit: Payer: Self-pay

## 2021-01-15 VITALS — BP 134/62 | HR 56 | Temp 97.9°F | Wt 204.0 lb

## 2021-01-15 DIAGNOSIS — N1831 Chronic kidney disease, stage 3a: Secondary | ICD-10-CM | POA: Diagnosis not present

## 2021-01-15 DIAGNOSIS — D692 Other nonthrombocytopenic purpura: Secondary | ICD-10-CM

## 2021-01-15 DIAGNOSIS — R7989 Other specified abnormal findings of blood chemistry: Secondary | ICD-10-CM | POA: Diagnosis not present

## 2021-01-15 DIAGNOSIS — E785 Hyperlipidemia, unspecified: Secondary | ICD-10-CM | POA: Diagnosis not present

## 2021-01-15 DIAGNOSIS — R5383 Other fatigue: Secondary | ICD-10-CM

## 2021-01-15 DIAGNOSIS — E1122 Type 2 diabetes mellitus with diabetic chronic kidney disease: Secondary | ICD-10-CM | POA: Diagnosis not present

## 2021-01-15 DIAGNOSIS — E1169 Type 2 diabetes mellitus with other specified complication: Secondary | ICD-10-CM | POA: Diagnosis not present

## 2021-01-15 DIAGNOSIS — I130 Hypertensive heart and chronic kidney disease with heart failure and stage 1 through stage 4 chronic kidney disease, or unspecified chronic kidney disease: Secondary | ICD-10-CM

## 2021-01-15 DIAGNOSIS — C679 Malignant neoplasm of bladder, unspecified: Secondary | ICD-10-CM | POA: Diagnosis not present

## 2021-01-15 DIAGNOSIS — D508 Other iron deficiency anemias: Secondary | ICD-10-CM

## 2021-01-15 DIAGNOSIS — I5022 Chronic systolic (congestive) heart failure: Secondary | ICD-10-CM | POA: Diagnosis not present

## 2021-01-15 LAB — BAYER DCA HB A1C WAIVED: HB A1C (BAYER DCA - WAIVED): 6.6 % — ABNORMAL HIGH (ref 4.8–5.6)

## 2021-01-15 NOTE — Assessment & Plan Note (Signed)
Continue to collaborate with urology, at length discussion on goals of care will continue at visits. 

## 2021-01-15 NOTE — Assessment & Plan Note (Signed)
Chronic, stable with recent labs showing improved CRT and GFR last visit.  Continue Losartan for kidney protection.  Refer to nephrology as needed.

## 2021-01-15 NOTE — Progress Notes (Signed)
BP 134/62 (BP Location: Left Arm)   Pulse (!) 56   Temp 97.9 F (36.6 C) (Oral)   Wt 204 lb (92.5 kg)   SpO2 93%   BMI 27.67 kg/m    Subjective:    Patient ID: John [name], male    DOB: 07-Mar-1940, 80 y.o.   MRN: [id number]  HPI: John [name] is a 80 y.o. male  Chief Complaint  Patient presents with   Diabetes   Hyperlipidemia   Hypertension   Bladder Cancer   Anemia   Chronic Kidney Disease   Fatigue    Patient states he would like to discuss having testosterone prescribed possibly as he has been tired a lot and having no energy and he states he heard it can help with having low energy and would like to discuss with provider at today's visit.    DIABETES Continues on Janumet XR 50-1000.  Last A1C July 6.4%. Hypoglycemic episodes:no Polydipsia/polyuria: no Visual disturbance: no Chest pain: no Paresthesias: no Glucose Monitoring: no             Accucheck frequency: Not Checking             Fasting glucose:             Post prandial:             Evening:             Before meals: Taking Insulin?: no             Long acting insulin:             Short acting insulin: Blood Pressure Monitoring: rarely Retinal Examination: Not Up To Date Foot Exam: Up to Date Pneumovax: Up to Date Influenza: Up to Date Aspirin: yes   ANEMIA History of low iron and B12 levels.  Continues supplements daily.  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   Anemia status: stable Etiology of anemia: unknown Duration of anemia treatment: months Compliance with treatment: good compliance Iron supplementation side effects: no Severity of anemia: mild Fatigue: no Decreased exercise tolerance: no  Dyspnea on exertion: no Palpitations: no Bleeding: no Pica: no   HYPERTENSION / HYPERLIPIDEMIA/HF Continues on Lasix 20 MG daily, Metoprolol 25 MG BID + Losartan 12.5 MG + Lipitor 40 MG, ASA. Last saw cardiology 10/15/20  with no medications changes. Has moderate underlying aortic stenosis noted on past imaging, which is monitored by cardiology.    Last echo 08/02/19 -- mild LV dysfunction with LVH, EF 45%.  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 Satisfied with current treatment? yes Duration of hypertension: chronic BP monitoring frequency: not checking BP range:  BP medication side effects: no Duration of hyperlipidemia: chronic Cholesterol medication side effects: no Cholesterol supplements: none Medication compliance: good compliance Aspirin: yes Recent stressors: no Recurrent headaches: no Visual changes: no Palpitations: no Dyspnea: no -- no orthopnea Chest pain: no Lower extremity edema: yes, at baseline Dizzy/lightheaded: no   BLADDER CANCER: Followed by urology, last visit with Dr. Bernardo Heater on 12/26/20 -- had cystoscopy.  He does endorse that each time he has these procedures it takes longer to recover, so he is pondering whether to continue doing these as he reports belly pain lasts for several weeks.   Relevant past medical, surgical, family and social history reviewed and updated as indicated. Interim medical history since our last visit reviewed. Allergies and  medications reviewed and updated.  Review of Systems  Constitutional:  Negative for activity change, diaphoresis, fatigue and fever.  Respiratory:  Negative for cough, chest tightness, shortness of breath and wheezing.   Cardiovascular:  Negative for chest pain, palpitations and leg swelling.  Gastrointestinal: Negative.   Endocrine: Negative for cold intolerance, heat intolerance, polydipsia, polyphagia and polyuria.  Neurological: Negative.   Psychiatric/Behavioral: Negative.     Per HPI unless specifically indicated above     Objective:    BP 134/62 (BP Location: Left Arm)   Pulse (!) 56   Temp 97.9 F (36.6 C) (Oral)   Wt 204 lb (92.5 kg)   SpO2 93%   BMI 27.67 kg/m   Wt Readings from Last 3  Encounters:  01/15/21 204 lb (92.5 kg)  12/26/20 203 lb (92.1 kg)  12/01/20 208 lb (94.3 kg)    Physical Exam Vitals and nursing note reviewed.  Constitutional:      General: He is awake. He is not in acute distress.    Appearance: He is well-developed and overweight. He is not ill-appearing.  HENT:     Head: Normocephalic and atraumatic.     Right Ear: Hearing normal. No drainage.     Left Ear: Hearing normal. No drainage.  Eyes:     General: Lids are normal.        Right eye: No discharge.        Left eye: No discharge.     Conjunctiva/sclera: Conjunctivae normal.     Pupils: Pupils are equal, round, and reactive to light.  Neck:     Thyroid: No thyromegaly.     Vascular: No carotid bruit.  Cardiovascular:     Rate and Rhythm: Normal rate and regular rhythm.     Heart sounds: S1 normal and S2 normal. Murmur heard.  Systolic murmur is present with a grade of 2/6.    No gallop.  Pulmonary:     Effort: Pulmonary effort is normal. No accessory muscle usage or respiratory distress.     Breath sounds: Normal breath sounds.  Abdominal:     General: Bowel sounds are normal.     Palpations: Abdomen is soft.  Musculoskeletal:        General: Normal range of motion.     Cervical back: Normal range of motion and neck supple.     Right lower leg: 1+ Edema present.     Left lower leg: 1+ Edema present.  Skin:    General: Skin is warm and dry.     Capillary Refill: Capillary refill takes less than 2 seconds.     Comments: Scattered pale purple bruises noted to bilateral upper extremities, skin intact.  Neurological:     Mental Status: He is alert and oriented to person, place, and time.     Deep Tendon Reflexes: Reflexes are normal and symmetric.  Psychiatric:        Attention and Perception: Attention normal.        Mood and Affect: Mood normal.        Speech: Speech normal.        Behavior: Behavior normal. Behavior is cooperative.        Thought Content: Thought content  normal.   Results for orders placed or performed in visit on 01/15/21  Bayer DCA Hb A1c Waived  Result Value Ref Range   HB A1C (BAYER DCA - WAIVED) 6.6 (H) 4.8 - 5.6 %      Assessment & Plan:  Problem List Items Addressed This Visit       Cardiovascular and Mediastinum   Chronic systolic heart failure (HCC)    Chronic, ongoing.  Euvolemic today.  Continue collaboration with cardiology and current medication regimen.  Recommend: - Reminded to call for an overnight weight gain of >2 pounds or a weekly weight weight of >5 pounds - not adding salt to his food and has been reading food labels. 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 BP at goal for age today.  Avoid hypotension. Continue current medication regimen and adjust as needed.  Urine ALB improved at 10 last visit and A:C <30.  Losartan for kidney protection.  Refer to nephrology if worsening kidney function.  Recommend he monitor his BP regularly at home and follow DASH diet.  Continue to collaborate with cardiology.  Return in 3 months.      Relevant Orders   Basic metabolic panel   Senile purpura ([place])    As evidenced by scattered bruises bilaterall upper extremities.  Gentle skin cleansers at home and lotion.  Monitor for skin breakdown, report to provider if present.        Endocrine   Hyperlipidemia associated with type 2 diabetes mellitus (HCC)    Chronic, ongoing.  Continue current medication regimen and adjust as needed.  Lipid panel today.      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  Urine ALB improved at 10 last visit and A:C <30.   Losartan for kidney protection.  Continue current medication regimen and adjust as needed.  Return  in 3 months.  Avoid hypoglycemia due to advanced age.  Will refer him to nephrology if worsening kidney function presents.      Relevant Orders   Bayer DCA Hb A1c Waived (Completed)     Genitourinary   Bladder cancer ([place])    Continue to collaborate with urology, at length discussion on goals of care will continue at visits.      CKD (chronic kidney disease) stage 3, GFR 30-59 ml/min (HCC)    Chronic, stable with recent labs showing improved CRT and GFR last visit.  Continue Losartan for kidney protection.  Refer to nephrology as needed.        Other   Elevated TSH    Recheck thyroid labs today due to fatigue.      Iron deficiency anemia    Ongoing, stable.  Continue daily iron.  Recheck iron and B12 level today + CBC -- recent levels stable.      Relevant Orders   CBC with Differential/Platelet   Iron, TIBC and Ferritin Panel   Vitamin B12   Other Visit Diagnoses     Fatigue, unspecified type       Check testosterone today per request.   Relevant Orders   Testosterone, free, total([facility])   TSH        Follow up plan: Return in about 3 months (around 04/17/2021) for HTN/HLD, T2DM, ANEMIA, BLADDER CA.

## 2021-01-15 NOTE — Assessment & Plan Note (Signed)
Ongoing, stable.  Continue daily iron.  Recheck iron and B12 level today + CBC -- recent levels stable.

## 2021-01-15 NOTE — Patient Instructions (Signed)

## 2021-01-15 NOTE — Assessment & Plan Note (Signed)
Chronic, ongoing.  Continue current medication regimen and adjust as needed. Lipid panel today. 

## 2021-01-15 NOTE — Assessment & Plan Note (Signed)
Chronic, ongoing with A1C 6.6% today, stable.  Urine ALB improved at 10 last visit and A:C <30.   Losartan for kidney protection.  Continue current medication regimen and adjust as needed.  Return in 3 months.  Avoid hypoglycemia due to advanced age.  Will refer him to nephrology if worsening kidney function presents.

## 2021-01-15 NOTE — Assessment & Plan Note (Signed)
Chronic, ongoing.  Euvolemic today.  Continue collaboration with cardiology and current medication regimen.  Recommend: - Reminded to call for an overnight weight gain of >2 pounds or a weekly weight weight of >5 pounds - not adding salt to his food and has been reading food labels. Reviewed the importance of keeping daily sodium intake to 2000mg  daily

## 2021-01-15 NOTE — Assessment & Plan Note (Signed)
Chronic, ongoing. BP at goal for age today.  Avoid hypotension. Continue current medication regimen and adjust as needed.  Urine ALB improved at 10 last visit and A:C <30.  Losartan for kidney protection.  Refer to nephrology if worsening kidney function.  Recommend he monitor his BP regularly at home and follow DASH diet.  Continue to collaborate with cardiology.  Return in 3 months.

## 2021-01-15 NOTE — Assessment & Plan Note (Signed)
As evidenced by scattered bruises bilaterall upper extremities.  Gentle skin cleansers at home and lotion.  Monitor for skin breakdown, report to provider if present. 

## 2021-01-15 NOTE — Assessment & Plan Note (Signed)
Recheck thyroid labs today due to fatigue.

## 2021-01-16 ENCOUNTER — Other Ambulatory Visit: Payer: Self-pay | Admitting: Nurse Practitioner

## 2021-01-16 DIAGNOSIS — R7989 Other specified abnormal findings of blood chemistry: Secondary | ICD-10-CM

## 2021-01-16 LAB — LIPID PANEL W/O CHOL/HDL RATIO
Cholesterol, Total: 112 mg/dL (ref 100–199)
HDL: 48 mg/dL (ref >39)
LDL Chol Calc (NIH): 49 mg/dL (ref 0–99)
Triglycerides: 74 mg/dL (ref 0–149)
VLDL Cholesterol Cal: 15 mg/dL (ref 5–40)

## 2021-01-16 LAB — CBC WITH DIFFERENTIAL/PLATELET
Basophils Absolute: 0 10*3/uL (ref 0.0–0.2)
Basos: 0 %
EOS (ABSOLUTE): 0.4 10*3/uL (ref 0.0–0.4)
Eos: 6 %
Hematocrit: 39 % (ref 37.5–51.0)
Hemoglobin: 12.6 g/dL — ABNORMAL LOW (ref 13.0–17.7)
Immature Grans (Abs): 0 10*3/uL (ref 0.0–0.1)
Immature Granulocytes: 0 %
Lymphocytes Absolute: 1.8 10*3/uL (ref 0.7–3.1)
Lymphs: 30 %
MCH: 28.2 pg (ref 26.6–33.0)
MCHC: 32.3 g/dL (ref 31.5–35.7)
MCV: 87 fL (ref 79–97)
Monocytes Absolute: 0.4 10*3/uL (ref 0.1–0.9)
Monocytes: 7 %
Neutrophils Absolute: 3.4 10*3/uL (ref 1.4–7.0)
Neutrophils: 57 %
Platelets: 210 10*3/uL (ref 150–450)
RBC: 4.47 x10E6/uL (ref 4.14–5.80)
RDW: 12.2 % (ref 11.6–15.4)
WBC: 5.9 10*3/uL (ref 3.4–10.8)

## 2021-01-16 LAB — BASIC METABOLIC PANEL
BUN/Creatinine Ratio: 25 — ABNORMAL HIGH (ref 10–24)
BUN: 27 mg/dL (ref 8–27)
CO2: 28 mmol/L (ref 20–29)
Calcium: 9.2 mg/dL (ref 8.6–10.2)
Chloride: 104 mmol/L (ref 96–106)
Creatinine, Ser: 1.06 mg/dL (ref 0.76–1.27)
Glucose: 97 mg/dL (ref 70–99)
Potassium: 4.6 mmol/L (ref 3.5–5.2)
Sodium: 146 mmol/L — ABNORMAL HIGH (ref 134–144)
eGFR: 70 mL/min/{1.73_m2} (ref >59)

## 2021-01-16 LAB — IRON,TIBC AND FERRITIN PANEL
Ferritin: 164 ng/mL (ref 30–400)
Iron Saturation: 37 % (ref 15–55)
Iron: 101 ug/dL (ref 38–169)
Total Iron Binding Capacity: 274 ug/dL (ref 250–450)
UIBC: 173 ug/dL (ref 111–343)

## 2021-01-16 LAB — VITAMIN B12: Vitamin B-12: 957 pg/mL (ref 232–1245)

## 2021-01-16 LAB — TSH: TSH: 4.54 u[IU]/mL — ABNORMAL HIGH (ref 0.450–4.500)

## 2021-01-16 NOTE — Progress Notes (Signed)
Good morning crew, please let Istvan know his labs have returned: - Kidney function, creatinine and eGFR is normal.  Sodium, salt level, was a little high.  Please reduce salt intake and increase water a little. - CBC shows mild anemia, but iron level normal.  Continue supplements at home.  - Cholesterol levels normal - Thyroid level, TSH, is a little elevated again.  I would like to recheck this in [redacted] weeks along with Testosterone as this was a little low too.  Please schedule a first thing in the morning lab only visit for 6 weeks.  Either of these could be cause for fatigue.  Any questions? Keep being amazing!!  Thank you for allowing me to participate in your care.  I appreciate you. Kindest regards, Dray Dente

## 2021-01-18 LAB — TESTOSTERONE, FREE, TOTAL, SHBG
Sex Hormone Binding: 80.9 nmol/L — ABNORMAL HIGH (ref 19.3–76.4)
Testosterone, Free: 2.4 pg/mL — ABNORMAL LOW (ref 6.6–18.1)
Testosterone: 250 ng/dL — ABNORMAL LOW (ref 264–916)

## 2021-03-04 ENCOUNTER — Other Ambulatory Visit: Payer: PPO

## 2021-03-04 ENCOUNTER — Other Ambulatory Visit: Payer: Self-pay

## 2021-03-04 DIAGNOSIS — R7989 Other specified abnormal findings of blood chemistry: Secondary | ICD-10-CM

## 2021-03-05 NOTE — Progress Notes (Signed)
Good morning crew, please let John [name] know his labs have returned.  His testosterone levels are improved this check but are on lower side of normal.  I do recommend he discuss this with his urologist next visit to see if there is anything available he could use to help with this or if they recommend anything.  Thyroid levels are normal this check.  No changes needed.  Have a great day!! Keep being awesome!!  Thank you for allowing me to participate in your care.  I appreciate you. Kindest regards, Leonell Lobdell

## 2021-03-06 LAB — TSH: TSH: 4.29 u[IU]/mL (ref 0.450–4.500)

## 2021-03-06 LAB — T4, FREE: Free T4: 1.1 ng/dL (ref 0.82–1.77)

## 2021-03-06 LAB — TESTOSTERONE, FREE, TOTAL, SHBG
Sex Hormone Binding: 71.2 nmol/L (ref 19.3–76.4)
Testosterone, Free: 4.4 pg/mL — ABNORMAL LOW (ref 6.6–18.1)
Testosterone: 297 ng/dL (ref 264–916)

## 2021-04-11 ENCOUNTER — Other Ambulatory Visit: Payer: Self-pay | Admitting: Nurse Practitioner

## 2021-04-13 NOTE — Telephone Encounter (Signed)
Requested Prescriptions  Pending Prescriptions Disp Refills   JANUMET XR 50-1000 MG TB24 [Pharmacy Med Name: Janumet XR 50-1000 MG Oral Tablet Extended Release 24 Hour] 180 tablet 0    Sig: Take 1 tablet by mouth twice daily     Endocrinology:  Diabetes - Biguanide + DPP-4 Inhibitor Combos Passed - 04/11/2021 11:18 AM      Passed - HBA1C is between 0 and 7.9 and within 180 days    Hemoglobin A1C  Date Value Ref Range Status  11/25/2015 6.6%  Final   HB A1C (BAYER DCA - WAIVED)  Date Value Ref Range Status  01/15/2021 6.6 (H) 4.8 - 5.6 % Final    Comment:             Prediabetes: 5.7 - 6.4          Diabetes: >6.4          Glycemic control for adults with diabetes: <7.0          Passed - Cr in normal range and within 360 days    Creatinine  Date Value Ref Range Status  12/13/2012 0.98 0.60 - 1.30 mg/dL Final   Creatinine, Ser  Date Value Ref Range Status  01/15/2021 1.06 0.76 - 1.27 mg/dL Final         Passed - eGFR in normal range and within 360 days    EGFR (African American)  Date Value Ref Range Status  12/13/2012 >60  Final   GFR calc Af Amer  Date Value Ref Range Status  03/12/2020 69 >59 mL/min/1.73 Final    Comment:    **In accordance with recommendations from the NKF-ASN Task force,**   [facility] is in the process of updating its eGFR calculation to the   2021 CKD-EPI creatinine equation that estimates kidney function   without a race variable.    EGFR (Non-African Amer.)  Date Value Ref Range Status  12/13/2012 >60  Final    Comment:    eGFR values <54mL/min/1.73 m2 may be an indication of chronic kidney disease (CKD). Calculated eGFR is useful in patients with stable renal function. The eGFR calculation will not be reliable in acutely ill patients when serum creatinine is changing rapidly. It is not useful in  patients on dialysis. The eGFR calculation may not be applicable to patients at the low and high extremes of body sizes, pregnant women, and  vegetarians.    GFR, Estimated  Date Value Ref Range Status  09/12/2020 >60 >60 mL/min Final    Comment:    (NOTE) Calculated using the CKD-EPI Creatinine Equation (2021)    eGFR  Date Value Ref Range Status  01/15/2021 70 >59 mL/min/1.73 Final         Passed - B12 Level in normal range and within 720 days    Vitamin B-12  Date Value Ref Range Status  01/15/2021 957 232 - 1,245 pg/mL Final         Passed - Valid encounter within last 6 months    Recent Outpatient Visits          2 months ago Type 2 diabetes mellitus with stage 3a chronic kidney disease, without long-term current use of insulin ([place])   [place], Jolene T, NP   7 months ago Type 2 diabetes mellitus with stage 3a chronic kidney disease, without long-term current use of insulin ([place])   [place], [place] T, NP   10 months ago Type 2 diabetes mellitus with stage  3a chronic kidney disease, without long-term current use of insulin ([place])   [place], [place] T, NP   1 year ago Type 2 diabetes mellitus with stage 3a chronic kidney disease, without long-term current use of insulin ([place])   [place], [place] T, NP   1 year ago Type 2 diabetes mellitus with stage 3b chronic kidney disease, without long-term current use of insulin ([place])   [place], [place] T, NP      Future Appointments            In 1 week [place], Barbaraann Faster, NP [facility]   In 7 months  [facility]           Passed - CBC within normal limits and completed in the last 12 months    WBC  Date Value Ref Range Status  01/15/2021 5.9 3.4 - 10.8 x10E3/uL Final  09/12/2020 7.4 4.0 - 10.5 K/uL Final   RBC  Date Value Ref Range Status  01/15/2021 4.47 4.14 - 5.80 x10E6/uL Final  09/12/2020 4.22 4.22 - 5.81 MIL/uL Final   Hemoglobin  Date Value Ref Range Status  01/15/2021 12.6 (L) 13.0 - 17.7 g/dL  Final   Hematocrit  Date Value Ref Range Status  01/15/2021 39.0 37.5 - 51.0 % Final   MCHC  Date Value Ref Range Status  01/15/2021 32.3 31.5 - 35.7 g/dL Final  09/12/2020 32.3 30.0 - 36.0 g/dL Final   [facility]  Date Value Ref Range Status  01/15/2021 28.2 26.6 - 33.0 pg Final  09/12/2020 28.9 26.0 - 34.0 pg Final   MCV  Date Value Ref Range Status  01/15/2021 87 79 - 97 fL Final   No results found for: PLTCOUNTKUC, LABPLAT, POCPLA RDW  Date Value Ref Range Status  01/15/2021 12.2 11.6 - 15.4 % Final

## 2021-04-19 NOTE — Patient Instructions (Signed)

## 2021-04-23 DIAGNOSIS — I10 Essential (primary) hypertension: Secondary | ICD-10-CM | POA: Diagnosis not present

## 2021-04-23 DIAGNOSIS — Z952 Presence of prosthetic heart valve: Secondary | ICD-10-CM | POA: Diagnosis not present

## 2021-04-23 DIAGNOSIS — Z951 Presence of aortocoronary bypass graft: Secondary | ICD-10-CM | POA: Diagnosis not present

## 2021-04-23 DIAGNOSIS — E119 Type 2 diabetes mellitus without complications: Secondary | ICD-10-CM | POA: Diagnosis not present

## 2021-04-23 DIAGNOSIS — E785 Hyperlipidemia, unspecified: Secondary | ICD-10-CM | POA: Diagnosis not present

## 2021-04-23 DIAGNOSIS — I350 Nonrheumatic aortic (valve) stenosis: Secondary | ICD-10-CM | POA: Diagnosis not present

## 2021-04-24 ENCOUNTER — Ambulatory Visit (INDEPENDENT_AMBULATORY_CARE_PROVIDER_SITE_OTHER): Payer: PPO | Admitting: Nurse Practitioner

## 2021-04-24 ENCOUNTER — Other Ambulatory Visit: Payer: Self-pay

## 2021-04-24 ENCOUNTER — Encounter: Payer: Self-pay | Admitting: Nurse Practitioner

## 2021-04-24 VITALS — BP 128/78 | HR 59 | Temp 97.7°F | Ht 72.0 in | Wt 204.4 lb

## 2021-04-24 DIAGNOSIS — N1831 Chronic kidney disease, stage 3a: Secondary | ICD-10-CM

## 2021-04-24 DIAGNOSIS — I700 Atherosclerosis of aorta: Secondary | ICD-10-CM

## 2021-04-24 DIAGNOSIS — I130 Hypertensive heart and chronic kidney disease with heart failure and stage 1 through stage 4 chronic kidney disease, or unspecified chronic kidney disease: Secondary | ICD-10-CM | POA: Diagnosis not present

## 2021-04-24 DIAGNOSIS — C679 Malignant neoplasm of bladder, unspecified: Secondary | ICD-10-CM

## 2021-04-24 DIAGNOSIS — E1122 Type 2 diabetes mellitus with diabetic chronic kidney disease: Secondary | ICD-10-CM

## 2021-04-24 DIAGNOSIS — L989 Disorder of the skin and subcutaneous tissue, unspecified: Secondary | ICD-10-CM | POA: Diagnosis not present

## 2021-04-24 DIAGNOSIS — E1169 Type 2 diabetes mellitus with other specified complication: Secondary | ICD-10-CM | POA: Diagnosis not present

## 2021-04-24 DIAGNOSIS — I5022 Chronic systolic (congestive) heart failure: Secondary | ICD-10-CM | POA: Diagnosis not present

## 2021-04-24 DIAGNOSIS — R7989 Other specified abnormal findings of blood chemistry: Secondary | ICD-10-CM | POA: Diagnosis not present

## 2021-04-24 DIAGNOSIS — E785 Hyperlipidemia, unspecified: Secondary | ICD-10-CM | POA: Diagnosis not present

## 2021-04-24 DIAGNOSIS — I350 Nonrheumatic aortic (valve) stenosis: Secondary | ICD-10-CM

## 2021-04-24 DIAGNOSIS — D692 Other nonthrombocytopenic purpura: Secondary | ICD-10-CM

## 2021-04-24 LAB — MICROALBUMIN, URINE WAIVED
Creatinine, Urine Waived: 200 mg/dL (ref 10–300)
Microalb, Ur Waived: 10 mg/L (ref 0–19)
Microalb/Creat Ratio: <30 mg/g (ref <30)

## 2021-04-24 LAB — BAYER DCA HB A1C WAIVED: HB A1C (BAYER DCA - WAIVED): 6.4 % — ABNORMAL HIGH (ref 4.8–5.6)

## 2021-04-24 NOTE — Assessment & Plan Note (Signed)
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  Recommend he call his dermatologist today and get scheduled for removal.

## 2021-04-24 NOTE — Assessment & Plan Note (Signed)
Chronic, ongoing.  Continue current medication regimen and adjust as needed. Lipid panel today. 

## 2021-04-24 NOTE — Assessment & Plan Note (Signed)
As evidenced by scattered bruises bilaterall upper extremities.  Gentle skin cleansers at home and lotion.  Monitor for skin breakdown, report to provider if present. 

## 2021-04-24 NOTE — Assessment & Plan Note (Signed)
Followed by cardiology.  Last echo June 2021.  Continue to collaborate with cardiology. 

## 2021-04-24 NOTE — Progress Notes (Addendum)
BP 128/78 (BP Location: Left Arm, Patient Position: Sitting, Cuff Size: Normal)    Pulse (!) 59    Temp 97.7 F (36.5 C) (Oral)    Ht 6' (1.829 m)    Wt 204 lb 6.4 oz (92.7 kg)    SpO2 97%    BMI 27.72 kg/m    Subjective:    Patient ID: John [name], male    DOB: Jan 11, 1941, 81 y.o.   MRN: [id number]  HPI: John [name] is a 81 y.o. male  Chief Complaint  Patient presents with   Hyperlipidemia   Bladder Cancer   Anemia   Diabetes   DIABETES Continues on Janumet XR 50-1000.  Last A1c November 6.6%. Hypoglycemic episodes:no Polydipsia/polyuria: no Visual disturbance: no Chest pain: no Paresthesias: no Glucose Monitoring: no             Accucheck frequency: Not Checking             Fasting glucose:             Post prandial:             Evening:             Before meals: Taking Insulin?: no             Long acting insulin:             Short acting insulin: Blood Pressure Monitoring: rarely Retinal Examination: Not Up To Date Foot Exam: Up to Date Pneumovax: Up to Date Influenza: Up to Date Aspirin: yes   HYPERTENSION / HYPERLIPIDEMIA/HF Continues on Lasix 20 MG daily, Metoprolol 25 MG BID + Losartan 12.5 MG + Lipitor 40 MG, ASA. Last saw cardiology 04/23/21 with no medications changes. Has moderate underlying aortic stenosis noted on past imaging, which is monitored by cardiology.  History of elevation of TSH on labs.  No symptoms.  Last echo 08/02/19 -- mild LV dysfunction with LVH, EF 45%.  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 Satisfied with current treatment? yes Duration of hypertension: chronic BP monitoring frequency: not checking BP range:  BP medication side effects: no Duration of hyperlipidemia: chronic Cholesterol medication side effects: no Cholesterol supplements: none Medication compliance: good compliance Aspirin: yes Recent stressors: no Recurrent headaches: no Visual changes: no Palpitations: no Dyspnea: no -- no  orthopnea Chest pain: no Lower extremity edema: yes, at baseline Dizzy/lightheaded: no   BLADDER CANCER: Followed by urology, last visit with Dr. Bernardo Heater on 12/26/20 -- had cystoscopy -- returns on 06/29/21.  He does endorse that each time he has these procedures it takes longer to recover (2-3 days to recover), so he is pondering whether to continue doing these and ponders whether to go back period.     Relevant past medical, surgical, family and social history reviewed and updated as indicated. Interim medical history since our last visit reviewed. Allergies and medications reviewed and updated.  Review of Systems  Constitutional:  Negative for activity change, diaphoresis, fatigue and fever.  Respiratory:  Negative for cough, chest tightness, shortness of breath and wheezing.   Cardiovascular:  Negative for chest pain, palpitations and leg swelling.  Gastrointestinal: Negative.   Endocrine: Negative for cold intolerance, heat intolerance, polydipsia, polyphagia and polyuria.  Neurological: Negative.   Psychiatric/Behavioral: Negative.     Per HPI unless specifically indicated above     Objective:    BP 128/78 (BP Location: Left Arm, Patient Position: Sitting, Cuff Size: Normal)    Pulse Marland Kitchen)  59    Temp 97.7 F (36.5 C) (Oral)    Ht 6' (1.829 m)    Wt 204 lb 6.4 oz (92.7 kg)    SpO2 97%    BMI 27.72 kg/m   Wt Readings from Last 3 Encounters:  04/24/21 204 lb 6.4 oz (92.7 kg)  01/15/21 204 lb (92.5 kg)  12/26/20 203 lb (92.1 kg)    Physical Exam Vitals and nursing note reviewed.  Constitutional:      General: He is awake. He is not in acute distress.    Appearance: He is well-developed and overweight. He is not ill-appearing.  HENT:     Head: Normocephalic and atraumatic.     Right Ear: Hearing normal. No drainage.     Left Ear: Hearing normal. No drainage.  Eyes:     General: Lids are normal.        Right eye: No discharge.        Left eye: No discharge.      Conjunctiva/sclera: Conjunctivae normal.     Pupils: Pupils are equal, round, and reactive to light.  Neck:     Thyroid: No thyromegaly.     Vascular: No carotid bruit.  Cardiovascular:     Rate and Rhythm: Normal rate and regular rhythm.     Heart sounds: S1 normal and S2 normal. Murmur heard.  Systolic murmur is present with a grade of 2/6.    No gallop.  Pulmonary:     Effort: Pulmonary effort is normal. No accessory muscle usage or respiratory distress.     Breath sounds: Normal breath sounds.  Abdominal:     General: Bowel sounds are normal.     Palpations: Abdomen is soft.  Musculoskeletal:        General: Normal range of motion.     Cervical back: Normal range of motion and neck supple.     Right lower leg: 1+ Edema present.     Left lower leg: 1+ Edema present.  Skin:    General: Skin is warm and dry.     Capillary Refill: Capillary refill takes less than 2 seconds.     Findings: Lesion present.          Comments: Scattered pale purple bruises noted to bilateral upper extremities, skin intact.  Neurological:     Mental Status: He is alert and oriented to person, place, and time.     Deep Tendon Reflexes: Reflexes are normal and symmetric.  Psychiatric:        Attention and Perception: Attention normal.        Mood and Affect: Mood normal.        Speech: Speech normal.        Behavior: Behavior normal. Behavior is cooperative.        Thought Content: Thought content normal.   Results for orders placed or performed in visit on 04/24/21  Bayer DCA Hb A1c Waived  Result Value Ref Range   HB A1C (BAYER DCA - WAIVED) 6.4 (H) 4.8 - 5.6 %  Microalbumin, Urine Waived  Result Value Ref Range   Microalb, Ur Waived 10 0 - 19 mg/L   Creatinine, Urine Waived 200 10 - 300 mg/dL   Microalb/Creat Ratio <30 <30 mg/g      Assessment & Plan:   Problem List Items Addressed This Visit       Cardiovascular and Mediastinum   Aortic atherosclerosis ([place])    Noted on imaging  02/08/18.  Continue daily medications for  prevention.      Relevant Orders   Comprehensive metabolic panel   Lipid Panel w/o Chol/HDL Ratio   Aortic valve stenosis, moderate    Followed by cardiology.  Last echo June 2021.  Continue to collaborate with cardiology.      Chronic systolic heart failure (HCC)    Chronic, ongoing.  Euvolemic today.  Continue collaboration with cardiology and current medication regimen.  Recommend: - Reminded to call for an overnight weight gain of >2 pounds or a weekly weight weight of >5 pounds - not adding salt to his food and has been reading food labels. 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 BP at goal for age today.  Avoid hypotension. Continue current medication regimen and adjust as needed.  Urine ALB improved at 10 today and A:C <30 [place] 2023).  Losartan for kidney protection.  Refer to nephrology if worsening kidney function.  Recommend he monitor his BP regularly at home and follow DASH diet.  Continue to collaborate with cardiology.  Return in 6 months.      Relevant Orders   Microalbumin, Urine Waived (Completed)   Comprehensive metabolic panel   Senile purpura ([place])    As evidenced by scattered bruises bilaterall upper extremities.  Gentle skin cleansers at home and lotion.  Monitor for skin breakdown, report to provider if present.      Relevant Orders   CBC with Differential/Platelet     Endocrine   Hyperlipidemia associated with type 2 diabetes mellitus (HCC)    Chronic, ongoing.  Continue current medication regimen and adjust as needed.  Lipid panel today.      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  Urine ALB 10 and A:C <30 today (February 2023).   Losartan for kidney protection.  Continue current medication regimen and adjust as needed.  Return in 6 months.  Avoid hypoglycemia due to advanced age.  Will refer him to nephrology if worsening kidney function presents.      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  Recommend he call his dermatologist today and get scheduled for removal.        Genitourinary   Bladder cancer ([place])    Continue to collaborate with urology, at length discussion on goals of care will continue at visits.      Relevant Orders   Comprehensive metabolic panel   CKD (chronic kidney disease) stage 3, GFR 30-59 ml/min (HCC)    Chronic, stable with recent labs showing improved CRT and GFR last visit.  Continue Losartan for kidney protection.  Refer to nephrology as needed.      Relevant Orders   Microalbumin, Urine Waived (Completed)   Comprehensive metabolic panel     Other   Elevated TSH    Recheck thyroid labs today, history of elevations.  Start medication as needed.  Relevant Orders   TSH   T4, free   Thyroid peroxidase antibody     Follow up plan: Return in about 6 months (around 10/22/2021) for T2DM, HTN/HLD, BLADDER CANCER, ANEMIA.

## 2021-04-24 NOTE — Assessment & Plan Note (Signed)
Recheck thyroid labs today, history of elevations.  Start medication as needed. 

## 2021-04-24 NOTE — Assessment & Plan Note (Signed)
Chronic, ongoing.  Euvolemic today.  Continue collaboration with cardiology and current medication regimen.  Recommend: - Reminded to call for an overnight weight gain of >2 pounds or a weekly weight weight of >5 pounds - not adding salt to his food and has been reading food labels. Reviewed the importance of keeping daily sodium intake to <2000mg daily  - Avoid NSAIDs 

## 2021-04-24 NOTE — Assessment & Plan Note (Addendum)
Chronic, ongoing with A1C 6.4% today, stable.  Urine ALB 10 and A:C <30 today (February 2023).   Losartan for kidney protection.  Continue current medication regimen and adjust as needed.  Return in 6 months.  Avoid hypoglycemia due to advanced age.  Will refer him to nephrology if worsening kidney function presents.

## 2021-04-24 NOTE — Assessment & Plan Note (Signed)
Chronic, ongoing. BP at goal for age today.  Avoid hypotension. Continue current medication regimen and adjust as needed.  Urine ALB improved at 10 today and A:C <30 [place] 2023).  Losartan for kidney protection.  Refer to nephrology if worsening kidney function.  Recommend he monitor his BP regularly at home and follow DASH diet.  Continue to collaborate with cardiology.  Return in 6 months.

## 2021-04-24 NOTE — Assessment & Plan Note (Signed)
Continue to collaborate with urology, at length discussion on goals of care will continue at visits. 

## 2021-04-24 NOTE — Assessment & Plan Note (Signed)
Noted on imaging 02/08/18.  Continue daily medications for prevention.

## 2021-04-24 NOTE — Assessment & Plan Note (Signed)
Chronic, stable with recent labs showing improved CRT and GFR last visit.  Continue Losartan for kidney protection.  Refer to nephrology as needed.

## 2021-04-25 LAB — CBC WITH DIFFERENTIAL/PLATELET
Basophils Absolute: 0 10*3/uL (ref 0.0–0.2)
Basos: 0 %
EOS (ABSOLUTE): 0.3 10*3/uL (ref 0.0–0.4)
Eos: 5 %
Hematocrit: 39.8 % (ref 37.5–51.0)
Hemoglobin: 13.2 g/dL (ref 13.0–17.7)
Immature Grans (Abs): 0 10*3/uL (ref 0.0–0.1)
Immature Granulocytes: 0 %
Lymphocytes Absolute: 1.8 10*3/uL (ref 0.7–3.1)
Lymphs: 28 %
MCH: 29.3 pg (ref 26.6–33.0)
MCHC: 33.2 g/dL (ref 31.5–35.7)
MCV: 88 fL (ref 79–97)
Monocytes Absolute: 0.4 10*3/uL (ref 0.1–0.9)
Monocytes: 7 %
Neutrophils Absolute: 3.7 10*3/uL (ref 1.4–7.0)
Neutrophils: 60 %
Platelets: 205 10*3/uL (ref 150–450)
RBC: 4.5 x10E6/uL (ref 4.14–5.80)
RDW: 12.3 % (ref 11.6–15.4)
WBC: 6.2 10*3/uL (ref 3.4–10.8)

## 2021-04-25 LAB — COMPREHENSIVE METABOLIC PANEL
ALT: 20 IU/L (ref 0–44)
AST: 24 IU/L (ref 0–40)
Albumin/Globulin Ratio: 1.4 (ref 1.2–2.2)
Albumin: 4.1 g/dL (ref 3.6–4.6)
Alkaline Phosphatase: 81 IU/L (ref 44–121)
BUN/Creatinine Ratio: 17 (ref 10–24)
BUN: 19 mg/dL (ref 8–27)
Bilirubin Total: 0.5 mg/dL (ref 0.0–1.2)
CO2: 27 mmol/L (ref 20–29)
Calcium: 9.2 mg/dL (ref 8.6–10.2)
Chloride: 102 mmol/L (ref 96–106)
Creatinine, Ser: 1.11 mg/dL (ref 0.76–1.27)
Globulin, Total: 2.9 g/dL (ref 1.5–4.5)
Glucose: 119 mg/dL — ABNORMAL HIGH (ref 70–99)
Potassium: 4.3 mmol/L (ref 3.5–5.2)
Sodium: 144 mmol/L (ref 134–144)
Total Protein: 7 g/dL (ref 6.0–8.5)
eGFR: 66 mL/min/{1.73_m2} (ref >59)

## 2021-04-25 LAB — LIPID PANEL W/O CHOL/HDL RATIO
Cholesterol, Total: 129 mg/dL (ref 100–199)
HDL: 53 mg/dL (ref >39)
LDL Chol Calc (NIH): 58 mg/dL (ref 0–99)
Triglycerides: 99 mg/dL (ref 0–149)
VLDL Cholesterol Cal: 18 mg/dL (ref 5–40)

## 2021-04-25 LAB — TSH: TSH: 4.72 u[IU]/mL — ABNORMAL HIGH (ref 0.450–4.500)

## 2021-04-25 LAB — T4, FREE: Free T4: 1.11 ng/dL (ref 0.82–1.77)

## 2021-04-25 LAB — THYROID PEROXIDASE ANTIBODY: Thyroperoxidase Ab SerPl-aCnc: 11 IU/mL (ref 0–34)

## 2021-04-26 NOTE — Progress Notes (Signed)
Good morning crew, please let John [name] know his labs have returned and overall remain stable.  Kidney and liver function are normal.  CBC shows no anemia or infection.  Cholesterol labs are at goal.  Thyroid labs show mild elevation in TSH again, but no need to start medication at this time, as Free T4 normal = will continue to monitor.  Any questions? Keep being wonderful!!  Thank you for allowing me to participate in your care.  I appreciate you. Kindest regards, Rolin Schult

## 2021-04-29 ENCOUNTER — Other Ambulatory Visit: Payer: Self-pay | Admitting: Nurse Practitioner

## 2021-04-29 NOTE — Telephone Encounter (Signed)
Requested medication (s) are due for refill today: Yes ? ?Requested medication (s) are on the active medication list: Yes ? ?Last refill:  03/12/20 ? ?Future visit scheduled: Yes ? ?Notes to clinic:  Prescription expired. ? ? ? ?Requested Prescriptions  ?Pending Prescriptions Disp Refills  ? metoprolol tartrate (LOPRESSOR) 25 MG tablet [Pharmacy Med Name: Metoprolol Tartrate 25 MG Oral Tablet] 180 tablet 0  ?  Sig: Take 1 tablet by mouth twice daily  ?  ? Cardiovascular:  Beta Blockers Passed - 04/29/2021  2:27 AM  ?  ?  Passed - Last BP in normal range  ?  BP Readings from Last 1 Encounters:  ?04/24/21 128/78  ?  ?  ?  ?  Passed - Last Heart Rate in normal range  ?  Pulse Readings from Last 1 Encounters:  ?04/24/21 (!) 59  ?  ?  ?  ?  Passed - Valid encounter within last 6 months  ?  Recent Outpatient Visits   ? ?      ? 5 days ago Type 2 diabetes mellitus with stage 3a chronic kidney disease, without long-term current use of insulin ([place])  ? [place], [place] T, NP  ? 3 months ago Type 2 diabetes mellitus with stage 3a chronic kidney disease, without long-term current use of insulin ([place])  ? [place], Garland T, NP  ? 7 months ago Type 2 diabetes mellitus with stage 3a chronic kidney disease, without long-term current use of insulin ([place])  ? [place], [place] T, NP  ? 10 months ago Type 2 diabetes mellitus with stage 3a chronic kidney disease, without long-term current use of insulin ([place])  ? [place], [place] T, NP  ? 1 year ago Type 2 diabetes mellitus with stage 3a chronic kidney disease, without long-term current use of insulin ([place])  ? [facility] [place], Henrine Screws T, NP  ? ?  ?  ?Future Appointments   ? ?        ? In 5 months Cannady, Barbaraann Faster, NP [employer], [facility]  ? In 7 months  [place]  ? ?  ? ?  ?  ?  ? ?

## 2021-05-09 ENCOUNTER — Other Ambulatory Visit: Payer: Self-pay | Admitting: Nurse Practitioner

## 2021-05-11 NOTE — Telephone Encounter (Signed)
Requested Prescriptions  ?Pending Prescriptions Disp Refills  ?? losartan (COZAAR) 25 MG tablet [Pharmacy Med Name: Losartan Potassium 25 MG Oral Tablet] 45 tablet 0  ?  Sig: Take 1/2 (one-half) tablet by mouth once daily  ?  ? Cardiovascular:  Angiotensin Receptor Blockers Passed - 05/09/2021  3:41 PM  ?  ?  Passed - Cr in normal range and within 180 days  ?  Creatinine  ?Date Value Ref Range Status  ?12/13/2012 0.98 0.60 - 1.30 mg/dL Final  ? ?Creatinine, Ser  ?Date Value Ref Range Status  ?04/24/2021 1.11 0.76 - 1.27 mg/dL Final  ?   ?  ?  Passed - K in normal range and within 180 days  ?  Potassium  ?Date Value Ref Range Status  ?04/24/2021 4.3 3.5 - 5.2 mmol/L Final  ?12/13/2012 3.4 (L) 3.5 - 5.1 mmol/L Final  ?   ?  ?  Passed - Patient is not pregnant  ?  ?  Passed - Last BP in normal range  ?  BP Readings from Last 1 Encounters:  ?04/24/21 128/78  ?   ?  ?  Passed - Valid encounter within last 6 months  ?  Recent Outpatient Visits   ?      ? 2 weeks ago Type 2 diabetes mellitus with stage 3a chronic kidney disease, without long-term current use of insulin ([place])  ? [place], [place]oy T, NP  ? 3 months ago Type 2 diabetes mellitus with stage 3a chronic kidney disease, without long-term current use of insulin ([place])  ? [place], Monetta T, NP  ? 8 months ago Type 2 diabetes mellitus with stage 3a chronic kidney disease, without long-term current use of insulin ([place])  ? [facility], [place] T, NP  ? 11 months ago Type 2 diabetes mellitus with stage 3a chronic kidney disease, without long-term current use of insulin ([place])  ? [place], [place] T, NP  ? 1 year ago Type 2 diabetes mellitus with stage 3a chronic kidney disease, without long-term current use of insulin ([place])  ? [facility], Henrine Screws T, NP  ?  ?  ?Future Appointments   ?        ? In 5 months Cannady, Barbaraann Faster, NP [employer] [facility]  ? In 6  months  [place]  ?  ? ?  ?  ?  ? ?

## 2021-05-28 DIAGNOSIS — Z85828 Personal history of other malignant neoplasm of skin: Secondary | ICD-10-CM | POA: Diagnosis not present

## 2021-05-28 DIAGNOSIS — C4442 Squamous cell carcinoma of skin of scalp and neck: Secondary | ICD-10-CM | POA: Diagnosis not present

## 2021-05-28 DIAGNOSIS — L578 Other skin changes due to chronic exposure to nonionizing radiation: Secondary | ICD-10-CM | POA: Diagnosis not present

## 2021-05-28 DIAGNOSIS — Z859 Personal history of malignant neoplasm, unspecified: Secondary | ICD-10-CM | POA: Diagnosis not present

## 2021-05-28 DIAGNOSIS — L570 Actinic keratosis: Secondary | ICD-10-CM | POA: Diagnosis not present

## 2021-05-28 DIAGNOSIS — D485 Neoplasm of uncertain behavior of skin: Secondary | ICD-10-CM | POA: Diagnosis not present

## 2021-05-28 DIAGNOSIS — C44519 Basal cell carcinoma of skin of other part of trunk: Secondary | ICD-10-CM | POA: Diagnosis not present

## 2021-05-30 ENCOUNTER — Other Ambulatory Visit: Payer: Self-pay

## 2021-05-30 ENCOUNTER — Emergency Department
Admission: EM | Admit: 2021-05-30 | Discharge: 2021-05-30 | Disposition: A | Discharge status: Home / Self Care | Payer: PPO | Attending: Emergency Medicine | Admitting: Emergency Medicine

## 2021-05-30 ENCOUNTER — Encounter: Payer: Self-pay | Admitting: Emergency Medicine

## 2021-05-30 DIAGNOSIS — Z7984 Long term (current) use of oral hypoglycemic drugs: Secondary | ICD-10-CM | POA: Insufficient documentation

## 2021-05-30 DIAGNOSIS — Z4801 Encounter for change or removal of surgical wound dressing: Secondary | ICD-10-CM | POA: Diagnosis not present

## 2021-05-30 DIAGNOSIS — Z5189 Encounter for other specified aftercare: Secondary | ICD-10-CM

## 2021-05-30 DIAGNOSIS — L7622 Postprocedural hemorrhage and hematoma of skin and subcutaneous tissue following other procedure: Secondary | ICD-10-CM | POA: Insufficient documentation

## 2021-05-30 DIAGNOSIS — Z79899 Other long term (current) drug therapy: Secondary | ICD-10-CM | POA: Diagnosis not present

## 2021-05-30 DIAGNOSIS — Z7982 Long term (current) use of aspirin: Secondary | ICD-10-CM | POA: Insufficient documentation

## 2021-05-30 NOTE — ED Provider Notes (Signed)
?[facility] EMERGENCY DEPARTMENT ?Provider Note ? ? ?CSN: [id number] ?Arrival date & time: 05/30/21  1904 ? ?  ? ?History ? ?Chief Complaint  ?Patient presents with  ? Wound Check  ? ? ?John [name] is a 81 y.o. male.  Presents to the emergency department for evaluation of postoperative bleeding.  Patient had a lesion cut off of his scalp, approximately 2 x 2 cm on Thursday of this week.  Today, he noticed when he was going to the restroom that the wound was bleeding.  He has been applying pressure and since being here in the ER the wound has stopped bleeding.  He is on aspirin.  He denies any trauma or injury.  No dizziness or lightheadedness.  No trauma to the head. ? ?HPI ? ?  ? ?Home Medications ?Prior to Admission medications   ?Medication Sig Start Date End Date Taking? Authorizing Provider  ?Ascorbic Acid (VITAMIN C) 1000 MG tablet Take 1,000 mg by mouth daily.    [provider]  ?aspirin EC 81 MG tablet Take 81 mg by mouth daily. Swallow whole.    [provider]  ?atorvastatin (LIPITOR) 40 MG tablet TAKE 1 TABLET BY MOUTH ONCE DAILY AT 6 PM ?Patient taking differently: Take 40 mg by mouth daily. TAKE 1 TABLET BY MOUTH ONCE DAILY AT 6 PM 03/12/20   Cannady, Henrine Screws T, NP  ?furosemide (LASIX) 40 MG tablet TAKE ONE-HALF TABLET BY MOUTH ONCE DAILY ?Patient taking differently: Take 20 mg by mouth daily. TAKE ONE-HALF TABLET BY MOUTH ONCE DAILY 06/11/20   Venita Lick, NP  ?Iron, Ferrous Sulfate, 142 (45 Fe) MG TBCR Take 45 mg by mouth daily.    [provider]  ?JANUMET XR 50-1000 MG TB24 Take 1 tablet by mouth twice daily 04/13/21   Cannady, Jolene T, NP  ?KLOR-CON M10 10 MEQ tablet Take 1 tablet (10 mEq total) by mouth daily. 03/12/20   Marnee Guarneri T, NP  ?losartan (COZAAR) 25 MG tablet Take 1/2 (one-half) tablet by mouth once daily 05/11/21   Marnee Guarneri T, NP  ?metoprolol tartrate (LOPRESSOR) 25 MG tablet Take 1 tablet by mouth twice daily 04/29/21    Venita Lick, NP  ?   ? ?Allergies    ?Patient has no known allergies.   ? ?Review of Systems   ?Review of Systems ? ?Physical Exam ?Updated Vital Signs ?BP (!) 165/81 (BP Location: Left Arm)   Pulse 64   Temp 97.8 ?F (36.6 ?C) (Oral)   Resp 20   Ht 6' (1.829 m)   Wt 93 kg   SpO2 95%   BMI 27.80 kg/m?  ?Physical Exam ?Constitutional:   ?   Appearance: He is well-developed.  ?HENT:  ?   Head: Normocephalic and atraumatic.  ?Eyes:  ?   Conjunctiva/sclera: Conjunctivae normal.  ?Cardiovascular:  ?   Rate and Rhythm: Normal rate.  ?Pulmonary:  ?   Effort: Pulmonary effort is normal. No respiratory distress.  ?Musculoskeletal:     ?   General: Normal range of motion.  ?   Cervical back: Normal range of motion.  ?Skin: ?   General: Skin is warm.  ?   Findings: No rash.  ?   Comments: Mid scalp there is a 2 x 2 cm flat wound with good eschar tissue present.  No active bleeding or ulceration.  No fluctuance, tenderness or abscess formation.  Patient is minimally tender to palpation along the wound.  ?Neurological:  ?  General: No focal deficit present.  ?   Mental Status: He is alert and oriented to person, place, and time. Mental status is at baseline.  ?   Cranial Nerves: No cranial nerve deficit.  ?Psychiatric:     ?   Behavior: Behavior normal.     ?   Thought Content: Thought content normal.  ? ? ?ED Results / Procedures / Treatments   ?Labs ?(all labs ordered are listed, but only abnormal results are displayed) ?Labs Reviewed - No data to display ? ?EKG ?None ? ?Radiology ?No results found. ? ?Procedures ?Procedures  ? ? ?Medications Ordered in ED ?Medications - No data to display ? ?ED Course/ Medical Decision Making/ A&P ?  ?                        ?Medical Decision Making ? ?81 year old male with skin lesion removed from his scalp 2 days ago.  Today noticed reoccurring bleeding at the site with standing up to go to the bathroom and leaning forward he noticed blood dripping down.  Wife states it was  quite a bit of blood, they did place pressure over the wound with gauze and presented to the ER, since being here in the ER the bleeding has stopped.  He feels well, vital signs are stable.  He is on aspirin for blood thinner.  Wound appears well with no signs of any infectious process or hematoma/abscess formation.  No signs of cellulitis.  Wound is not currently bleeding but I did place some gauze with Tegaderm over the wound just in case, wife was given instructions in regards to returning to the ER if dressing becomes saturated due to recurrent bleeding. ?Final Clinical Impression(s) / ED Diagnoses ?Final diagnoses:  ?Visit for wound check  ? ? ?Rx / DC Orders ?ED Discharge Orders   ? ? None  ? ?  ? ? ?  ?Duanne Guess, PA-C ?05/30/21 2019 ? ?  ?Naaman Plummer, MD ?05/31/21 1107 ? ?

## 2021-05-30 NOTE — ED Notes (Signed)
Was sitting and all of sudden area that had burnt from cancer at Dr. Phillip Heal Dermatologist started pouring blood. He moved the bandage yesterday per instructions. They said it bled for 30 minutes.Did not stop until he got here. Denies taking a blood thinner. He does take an '81mg'$  ASA daily. Denies hitting head. Wife states used 1/2 of paper towels to put pressure on top head. ?

## 2021-05-30 NOTE — ED Triage Notes (Signed)
Pt to ED via POV with wife, pt had possible skin cancer removed to top of his head on Thursday. Reports, today while watching TV had sudden onset bleeding, pt's wife reports initially unable to control bleeding, bleeding controlled on arrival to ED. Pt with noted scab to top of his head.  ?

## 2021-05-30 NOTE — Discharge Instructions (Signed)
Please keep current dressing on for 24 hours.  After 24 hours you can remove current dressing.  If any reoccurring bleeding would recommend using gauze and Tegaderm for 24 hours and less dressing becomes saturated with blood would recommend returning to the ER. ?

## 2021-06-01 ENCOUNTER — Telehealth: Payer: Self-pay | Admitting: *Deleted

## 2021-06-01 NOTE — Telephone Encounter (Signed)
Transition Care Management Follow-up Telephone Call ?Date of discharge and from where: [facility] 05-30-2021 ?How have you been since you were released from the hospital?  ?Feeling better ?Any questions or concerns? No ? ?Items Reviewed: ?Did the pt receive and understand the discharge instructions provided? Yes  ?Medications obtained and verified? No  ?Other? No  ?Any new allergies since your discharge? No  ?Dietary orders reviewed? No ?Do you have support at home? Yes  ? ?Home Care and Equipment/Supplies: ?Were home health services ordered? not applicable ?If so, what is the name of the agency?   ?Has the agency set up a time to come to the patient's home? not applicable ?Were any new equipment or medical supplies ordered?   ?What is the name of the medical supply agency?  ?Were you able to get the supplies/equipment? not applicable ?Do you have any questions related to the use of the equipment or supplies?  ?  ?Functional Questionnaire: (I = Independent and D = Dependent) ?ADLs: I ? ?Bathing/Dressing- I ? ?Meal Prep- I ? ?Eating- I ? ?Maintaining continence- I ? ?Transferring/Ambulation- I ? ?Managing Meds- I ? ?Follow up appointments reviewed: ? ?PCP Hospital f/u appt confirmed? No  Scheduled to see  on  @ . ?[facility] f/u appt confirmed? Yes  Scheduled to see Phillip Heal in [place] on 06-01-2021. ?Are transportation arrangements needed? No  ?If their condition worsens, is the pt aware to call PCP or go to the Emergency Dept.? Yes ?Was the patient provided with contact information for the PCP's office or ED? Yes ?Was to pt encouraged to call back with questions or concerns? Yes  ?

## 2021-06-02 ENCOUNTER — Other Ambulatory Visit: Payer: Self-pay | Admitting: Nurse Practitioner

## 2021-06-03 NOTE — Telephone Encounter (Signed)
Requested Prescriptions  ?Pending Prescriptions Disp Refills  ?? atorvastatin (LIPITOR) 40 MG tablet [Pharmacy Med Name: Atorvastatin Calcium 40 MG Oral Tablet] 90 tablet 0  ?  Sig: TAKE 1 TABLET BY MOUTH ONCE DAILY AT 6PM  ?  ? Cardiovascular:  Antilipid - Statins Failed - 06/02/2021 11:37 AM  ?  ?  Failed - Lipid Panel in normal range within the last 12 months  ?  Cholesterol, Total  ?Date Value Ref Range Status  ?04/24/2021 129 100 - 199 mg/dL Final  ? ?Cholesterol [place], [place]  ?Date Value Ref Range Status  ?05/19/2015 192 <200 mg/dL Final  ?  Comment:  ?                          Desirable                <200 ?                        Borderline High      200- 239 ?                        High                     >239 ?  ? ?LDL Chol Calc (NIH)  ?Date Value Ref Range Status  ?04/24/2021 58 0 - 99 mg/dL Final  ? ?HDL  ?Date Value Ref Range Status  ?04/24/2021 53 >39 mg/dL Final  ? ?Triglycerides  ?Date Value Ref Range Status  ?04/24/2021 99 0 - 149 mg/dL Final  ? ?Triglycerides Piccolo,Waived  ?Date Value Ref Range Status  ?05/19/2015 106 <150 mg/dL Final  ?  Comment:  ?                          Normal                   <150 ?                        Borderline High     150 - 199 ?                        High                200 - 499 ?                        Very High                >499 ?  ? ?  ?  ?  Passed - Patient is not pregnant  ?  ?  Passed - Valid encounter within last 12 months  ?  Recent Outpatient Visits   ?      ? 1 month ago Type 2 diabetes mellitus with stage 3a chronic kidney disease, without long-term current use of insulin ([place])  ? [place], [place]mar T, NP  ? 4 months ago Type 2 diabetes mellitus with stage 3a chronic kidney disease, without long-term current use of insulin ([place])  ? [place], Woodland T, NP  ? 8 months ago Type 2 diabetes mellitus with stage 3a chronic kidney disease, without long-term current use of insulin ([place])  ? [facility] Marnee Guarneri T, NP  ? 11 months ago Type 2 diabetes mellitus with stage 3a chronic kidney disease, without long-term current use of insulin ([place])  ? [place], [place]ia [place] T, NP  ? 1 year ago Type 2 diabetes mellitus with stage 3a chronic kidney disease, without long-term current use of insulin ([place])  ? [facility] [place], Henrine Screws T, NP  ?  ?  ?Future Appointments   ?        ? In 4 months Cannady, Barbaraann Faster, NP [facility]  ? In 6 months  [place]  ?  ? ?  ?  ?  ?? potassium chloride (KLOR-CON M) 10 MEQ tablet [Pharmacy Med Name: Potassium Chloride Crys ER 10 MEQ Oral Tablet Extended Release] 90 tablet 0  ?  Sig: Take 1 tablet by mouth once daily  ?  ? Endocrinology:  Minerals - Potassium Supplementation Passed - 06/02/2021 11:37 AM  ?  ?  Passed - K in normal range and within 360 days  ?  Potassium  ?Date Value Ref Range Status  ?04/24/2021 4.3 3.5 - 5.2 mmol/L Final  ?12/13/2012 3.4 (L) 3.5 - 5.1 mmol/L Final  ?   ?  ?  Passed - Cr in normal range and within 360 days  ?  Creatinine  ?Date Value Ref Range Status  ?12/13/2012 0.98 0.60 - 1.30 mg/dL Final  ? ?Creatinine, Ser  ?Date Value Ref Range Status  ?04/24/2021 1.11 0.76 - 1.27 mg/dL Final  ?   ?  ?  Passed - Valid encounter within last 12 months  ?  Recent Outpatient Visits   ?      ? 1 month ago Type 2 diabetes mellitus with stage 3a chronic kidney disease, without long-term current use of insulin ([place])  ? [place], Grayson T, NP  ? 4 months ago Type 2 diabetes mellitus with stage 3a chronic kidney disease, without long-term current use of insulin ([place])  ? [place], [place] T, NP  ? 8 months ago Type 2 diabetes mellitus with stage 3a chronic kidney disease, without long-term current use of insulin ([place])  ? [facility] [place], [place] T, NP  ? 11 months ago Type 2 diabetes mellitus with stage 3a chronic kidney disease,  without long-term current use of insulin ([place])  ? [place], [place] T, NP  ? 1 year ago Type 2 diabetes mellitus with stage 3a chronic kidney disease, without long-term current use of insulin ([place])  ? [facility] [place], Henrine Screws T, NP  ?  ?  ?Future Appointments   ?        ? In 4 months Cannady, Barbaraann Faster, NP [facility]  ? In 6 months  [place]  ?  ? ?  ?  ?  ? ? ?

## 2021-06-29 ENCOUNTER — Ambulatory Visit: Payer: PPO | Admitting: Urology

## 2021-06-29 ENCOUNTER — Encounter: Payer: Self-pay | Admitting: Urology

## 2021-06-29 VITALS — BP 130/72 | HR 78 | Ht 72.0 in | Wt 203.0 lb

## 2021-06-29 DIAGNOSIS — Z8551 Personal history of malignant neoplasm of bladder: Secondary | ICD-10-CM | POA: Diagnosis not present

## 2021-06-29 DIAGNOSIS — D494 Neoplasm of unspecified behavior of bladder: Secondary | ICD-10-CM | POA: Diagnosis not present

## 2021-06-29 LAB — MICROSCOPIC EXAMINATION: Bacteria, UA: NONE SEEN

## 2021-06-29 LAB — URINALYSIS, COMPLETE
Bilirubin, UA: NEGATIVE
Glucose, UA: NEGATIVE
Ketones, UA: NEGATIVE
Leukocytes,UA: NEGATIVE
Nitrite, UA: NEGATIVE
Protein,UA: NEGATIVE
RBC, UA: NEGATIVE
Specific Gravity, UA: 1.015 (ref 1.005–1.030)
Urobilinogen, Ur: 0.2 mg/dL (ref 0.2–1.0)
pH, UA: 7 (ref 5.0–7.5)

## 2021-06-29 MED ORDER — CIPROFLOXACIN HCL 500 MG PO TABS
500.0000 mg | ORAL_TABLET | Freq: Once | ORAL | Status: AC
  Administered 2021-06-29: 500 mg via ORAL

## 2021-06-29 NOTE — Progress Notes (Signed)
? ?  06/29/21 ? ?CC:  ?Chief Complaint  ?Patient presents with  ? Other  ? 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 ?HPI: 81 y.o. male presents for 6 month surveillance cystoscopy ? ?Refer to rooming tab for vitals ?NED. A&Ox3.   ?No respiratory distress   ?Abd soft, NT, ND ?Normal phallus with bilateral descended testicles ? ?Cystoscopy Procedure Note ? ?Patient identification was confirmed, informed consent was obtained, and patient was prepped using Betadine solution.  Lidocaine jelly was administered per urethral meatus.   ? ? ?Pre-Procedure: ?- Inspection reveals a normal caliber urethral meatus. ? ?Procedure: ?The flexible cystoscope was introduced without difficulty ?- Wide caliber bulbar stricture ?-Moderate lateral lobe enlargement prostate  ?- Elevated bladder neck, mild ?- Bilateral ureteral orifices identified ?- Bladder mucosa  reveals no ulcers, tumors ?- Posterior wall diverticulum ?- No bladder stones ?- Mild trabeculation ? ?Retroflexion shows no abnormality/tumor ? ? ?Post-Procedure: ?- Patient tolerated the procedure well ? 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 ? ? ?SAbbie Sons MD ? ?

## 2021-07-02 ENCOUNTER — Telehealth: Payer: Self-pay | Admitting: *Deleted

## 2021-07-02 NOTE — Telephone Encounter (Signed)
Routing to provider to advise patient.

## 2021-07-02 NOTE — Telephone Encounter (Signed)
Patient is calling to report he wants to follow up on testosterone level.  ?Good morning crew, please let Morell know his labs have returned.  His testosterone levels are improved this check but are on lower side of normal.  I do recommend he discuss this with his urologist next visit to see if there is anything available he could use to help with this or if they recommend anything.  Thyroid levels are normal this check.  No changes needed.  Have a great day!! ?Keep being awesome!!  Thank you for allowing me to participate in your care.  I appreciate you. ?Kindest regards, ?Jolene ?Patient forgot to discuss with urologist and feels that this is something his PCP can handle. Patient states he feels fatigued and no energy. Patient states his brother uses injection and he wants to try this. Patient advised I would send request for provider review. ? ?

## 2021-07-02 NOTE — Telephone Encounter (Signed)
Patient aware, no further questions.

## 2021-07-09 DIAGNOSIS — C4442 Squamous cell carcinoma of skin of scalp and neck: Secondary | ICD-10-CM | POA: Diagnosis not present

## 2021-07-09 DIAGNOSIS — L988 Other specified disorders of the skin and subcutaneous tissue: Secondary | ICD-10-CM | POA: Diagnosis not present

## 2021-07-14 ENCOUNTER — Other Ambulatory Visit: Payer: PPO

## 2021-07-20 ENCOUNTER — Other Ambulatory Visit: Payer: Self-pay | Admitting: Nurse Practitioner

## 2021-07-20 DIAGNOSIS — I5022 Chronic systolic (congestive) heart failure: Secondary | ICD-10-CM

## 2021-07-21 NOTE — Telephone Encounter (Signed)
Requested Prescriptions  Pending Prescriptions Disp Refills  . furosemide (LASIX) 40 MG tablet [Pharmacy Med Name: Furosemide 40 MG Oral Tablet] 45 tablet 0    Sig: TAKE ONE-HALF TABLET BY MOUTH ONCE DAILY     Cardiovascular:  Diuretics - Loop Failed - 07/20/2021  1:05 PM      Failed - Mg Level in normal range and within 180 days    Magnesium  Date Value Ref Range Status  08/15/2018 1.8 1.6 - 2.3 mg/dL Final         Passed - K in normal range and within 180 days    Potassium  Date Value Ref Range Status  04/24/2021 4.3 3.5 - 5.2 mmol/L Final  12/13/2012 3.4 (L) 3.5 - 5.1 mmol/L Final         Passed - Ca in normal range and within 180 days    Calcium  Date Value Ref Range Status  04/24/2021 9.2 8.6 - 10.2 mg/dL Final   Calcium, Total  Date Value Ref Range Status  12/13/2012 9.0 8.5 - 10.1 mg/dL Final   Calcium, Ion  Date Value Ref Range Status  05/06/2017 1.06 (L) 1.15 - 1.40 mmol/L Final         Passed - Na in normal range and within 180 days    Sodium  Date Value Ref Range Status  04/24/2021 144 134 - 144 mmol/L Final  12/13/2012 139 136 - 145 mmol/L Final         Passed - Cr in normal range and within 180 days    Creatinine  Date Value Ref Range Status  12/13/2012 0.98 0.60 - 1.30 mg/dL Final   Creatinine, Ser  Date Value Ref Range Status  04/24/2021 1.11 0.76 - 1.27 mg/dL Final         Passed - Cl in normal range and within 180 days    Chloride  Date Value Ref Range Status  04/24/2021 102 96 - 106 mmol/L Final  12/13/2012 103 98 - 107 mmol/L Final         Passed - Last BP in normal range    BP Readings from Last 1 Encounters:  06/29/21 130/72         Passed - Valid encounter within last 6 months    Recent Outpatient Visits          2 months ago Type 2 diabetes mellitus with stage 3a chronic kidney disease, without long-term current use of insulin ([place])   [place], Jolene T, NP   6 months ago Type 2 diabetes mellitus with  stage 3a chronic kidney disease, without long-term current use of insulin ([place])   [place], Jolene T, NP   10 months ago Type 2 diabetes mellitus with stage 3a chronic kidney disease, without long-term current use of insulin ([place])   [place], [place] T, NP   1 year ago Type 2 diabetes mellitus with stage 3a chronic kidney disease, without long-term current use of insulin ([place])   [place], Jolene T, NP   1 year ago Type 2 diabetes mellitus with stage 3a chronic kidney disease, without long-term current use of insulin ([place])   [place], Barbaraann Faster, NP      Future Appointments            In 3 months Cannady, Barbaraann Faster, NP [facility]   In 4 months  [facility]

## 2021-08-22 ENCOUNTER — Other Ambulatory Visit: Payer: Self-pay | Admitting: Nurse Practitioner

## 2021-08-24 DIAGNOSIS — Z859 Personal history of malignant neoplasm, unspecified: Secondary | ICD-10-CM | POA: Diagnosis not present

## 2021-08-24 DIAGNOSIS — Z85828 Personal history of other malignant neoplasm of skin: Secondary | ICD-10-CM | POA: Diagnosis not present

## 2021-09-07 DIAGNOSIS — E119 Type 2 diabetes mellitus without complications: Secondary | ICD-10-CM | POA: Diagnosis not present

## 2021-09-07 DIAGNOSIS — H35363 Drusen (degenerative) of macula, bilateral: Secondary | ICD-10-CM | POA: Diagnosis not present

## 2021-09-07 DIAGNOSIS — H5055 Alternating heterophoria: Secondary | ICD-10-CM | POA: Diagnosis not present

## 2021-09-07 DIAGNOSIS — H5203 Hypermetropia, bilateral: Secondary | ICD-10-CM | POA: Diagnosis not present

## 2021-09-07 LAB — HM DIABETES EYE EXAM

## 2021-09-19 ENCOUNTER — Other Ambulatory Visit: Payer: Self-pay | Admitting: Nurse Practitioner

## 2021-09-21 NOTE — Telephone Encounter (Signed)
Requested Prescriptions  Pending Prescriptions Disp Refills  . JANUMET XR 50-1000 MG TB24 [Pharmacy Med Name: Janumet XR 50-1000 MG Oral Tablet Extended Release 24 Hour] 180 tablet 0    Sig: Take 1 tablet by mouth twice daily     Endocrinology:  Diabetes - Biguanide + DPP-4 Inhibitor Combos Passed - 09/19/2021 10:10 AM      Passed - HBA1C is between 0 and 7.9 and within 180 days    Hemoglobin A1C  Date Value Ref Range Status  11/25/2015 6.6%  Final   HB A1C (BAYER DCA - WAIVED)  Date Value Ref Range Status  04/24/2021 6.4 (H) 4.8 - 5.6 % Final    Comment:             Prediabetes: 5.7 - 6.4          Diabetes: >6.4          Glycemic control for adults with diabetes: <7.0          Passed - Cr in normal range and within 360 days    Creatinine  Date Value Ref Range Status  12/13/2012 0.98 0.60 - 1.30 mg/dL Final   Creatinine, Ser  Date Value Ref Range Status  04/24/2021 1.11 0.76 - 1.27 mg/dL Final         Passed - eGFR in normal range and within 360 days    EGFR (African American)  Date Value Ref Range Status  12/13/2012 >60  Final   GFR calc Af Amer  Date Value Ref Range Status  03/12/2020 69 >59 mL/min/1.73 Final    Comment:    **In accordance with recommendations from the NKF-ASN Task force,**   [facility] is in the process of updating its eGFR calculation to the   2021 CKD-EPI creatinine equation that estimates kidney function   without a race variable.    EGFR (Non-African Amer.)  Date Value Ref Range Status  12/13/2012 >60  Final    Comment:    eGFR values <32m/min/1.73 m2 may be an indication of chronic kidney disease (CKD). Calculated eGFR is useful in patients with stable renal function. The eGFR calculation will not be reliable in acutely ill patients when serum creatinine is changing rapidly. It is not useful in  patients on dialysis. The eGFR calculation may not be applicable to patients at the low and high extremes of body sizes, pregnant women, and  vegetarians.    GFR, Estimated  Date Value Ref Range Status  09/12/2020 >60 >60 mL/min Final    Comment:    (NOTE) Calculated using the CKD-EPI Creatinine Equation (2021)    eGFR  Date Value Ref Range Status  04/24/2021 66 >59 mL/min/1.73 Final         Passed - B12 Level in normal range and within 720 days    Vitamin B-12  Date Value Ref Range Status  01/15/2021 957 232 - 1,245 pg/mL Final         Passed - Valid encounter within last 6 months    Recent Outpatient Visits          5 months ago Type 2 diabetes mellitus with stage 3a chronic kidney disease, without long-term current use of insulin ([place]   [place] T, NP   8 months ago Type 2 diabetes mellitus with stage 3a chronic kidney disease, without long-term current use of insulin ([place]   [place], NP   1 year ago Type 2 diabetes mellitus with stage  3a chronic kidney disease, without long-term current use of insulin ([place])   [place], [place] T, NP   1 year ago Type 2 diabetes mellitus with stage 3a chronic kidney disease, without long-term current use of insulin ([place])   [place], [place] T, NP   1 year ago Type 2 diabetes mellitus with stage 3a chronic kidney disease, without long-term current use of insulin ([place])   [place], [place] T, NP      Future Appointments            In 1 month Cannady, [place] T, NP [facility]   In 2 months  [facility]           Passed - CBC within normal limits and completed in the last 12 months    WBC  Date Value Ref Range Status  04/24/2021 6.2 3.4 - 10.8 x10E3/uL Final  09/12/2020 7.4 4.0 - 10.5 K/uL Final   RBC  Date Value Ref Range Status  04/24/2021 4.50 4.14 - 5.80 x10E6/uL Final  09/12/2020 4.22 4.22 - 5.81 MIL/uL Final   Hemoglobin  Date Value Ref Range Status  04/24/2021 13.2 13.0 - 17.7 g/dL Final    Hematocrit  Date Value Ref Range Status  04/24/2021 39.8 37.5 - 51.0 % Final   MCHC  Date Value Ref Range Status  04/24/2021 33.2 31.5 - 35.7 g/dL Final  09/12/2020 32.3 30.0 - 36.0 g/dL Final   [facility]  Date Value Ref Range Status  04/24/2021 29.3 26.6 - 33.0 pg Final  09/12/2020 28.9 26.0 - 34.0 pg Final   MCV  Date Value Ref Range Status  04/24/2021 88 79 - 97 fL Final   No results found for: "PLTCOUNTKUC", "LABPLAT", "POCPLA" RDW  Date Value Ref Range Status  04/24/2021 12.3 11.6 - 15.4 % Final

## 2021-10-01 ENCOUNTER — Other Ambulatory Visit: Payer: Self-pay | Admitting: Nurse Practitioner

## 2021-10-01 DIAGNOSIS — L988 Other specified disorders of the skin and subcutaneous tissue: Secondary | ICD-10-CM | POA: Diagnosis not present

## 2021-10-01 DIAGNOSIS — C4442 Squamous cell carcinoma of skin of scalp and neck: Secondary | ICD-10-CM | POA: Diagnosis not present

## 2021-10-01 NOTE — Telephone Encounter (Signed)
Requested Prescriptions  Pending Prescriptions Disp Refills  . JANUMET XR 50-1000 MG TB24 [Pharmacy Med Name: Janumet XR 50-1000 MG Oral Tablet Extended Release 24 Hour] 180 tablet 0    Sig: Take 1 tablet by mouth twice daily     Endocrinology:  Diabetes - Biguanide + DPP-4 Inhibitor Combos Passed - 10/01/2021  9:21 AM      Passed - HBA1C is between 0 and 7.9 and within 180 days    Hemoglobin A1C  Date Value Ref Range Status  11/25/2015 6.6%  Final   HB A1C (BAYER DCA - WAIVED)  Date Value Ref Range Status  04/24/2021 6.4 (H) 4.8 - 5.6 % Final    Comment:             Prediabetes: 5.7 - 6.4          Diabetes: >6.4          Glycemic control for adults with diabetes: <7.0          Passed - Cr in normal range and within 360 days    Creatinine  Date Value Ref Range Status  12/13/2012 0.98 0.60 - 1.30 mg/dL Final   Creatinine, Ser  Date Value Ref Range Status  04/24/2021 1.11 0.76 - 1.27 mg/dL Final         Passed - eGFR in normal range and within 360 days    EGFR (African American)  Date Value Ref Range Status  12/13/2012 >60  Final   GFR calc Af Amer  Date Value Ref Range Status  03/12/2020 69 >59 mL/min/1.73 Final    Comment:    **In accordance with recommendations from the NKF-ASN Task force,**   [facility] is in the process of updating its eGFR calculation to the   2021 CKD-EPI creatinine equation that estimates kidney function   without a race variable.    EGFR (Non-African Amer.)  Date Value Ref Range Status  12/13/2012 >60  Final    Comment:    eGFR values <45m/min/1.73 m2 may be an indication of chronic kidney disease (CKD). Calculated eGFR is useful in patients with stable renal function. The eGFR calculation will not be reliable in acutely ill patients when serum creatinine is changing rapidly. It is not useful in  patients on dialysis. The eGFR calculation may not be applicable to patients at the low and high extremes of body sizes, pregnant women, and  vegetarians.    GFR, Estimated  Date Value Ref Range Status  09/12/2020 >60 >60 mL/min Final    Comment:    (NOTE) Calculated using the CKD-EPI Creatinine Equation (2021)    eGFR  Date Value Ref Range Status  04/24/2021 66 >59 mL/min/1.73 Final         Passed - B12 Level in normal range and within 720 days    Vitamin B-12  Date Value Ref Range Status  01/15/2021 957 232 - 1,245 pg/mL Final         Passed - Valid encounter within last 6 months    Recent Outpatient Visits          5 months ago Type 2 diabetes mellitus with stage 3a chronic kidney disease, without long-term current use of insulin ([place]   [place] T, NP   8 months ago Type 2 diabetes mellitus with stage 3a chronic kidney disease, without long-term current use of insulin ([place]   [place], NP   1 year ago Type 2 diabetes mellitus with  stage 3a chronic kidney disease, without long-term current use of insulin ([place])   [place], [place] T, NP   1 year ago Type 2 diabetes mellitus with stage 3a chronic kidney disease, without long-term current use of insulin ([place])   [place], [place] T, NP   1 year ago Type 2 diabetes mellitus with stage 3a chronic kidney disease, without long-term current use of insulin ([place])   [place], [place] T, NP      Future Appointments            In 3 weeks Cannady, Barbaraann Faster, NP [facility]   In 2 months  [facility]           Passed - CBC within normal limits and completed in the last 12 months    WBC  Date Value Ref Range Status  04/24/2021 6.2 3.4 - 10.8 x10E3/uL Final  09/12/2020 7.4 4.0 - 10.5 K/uL Final   RBC  Date Value Ref Range Status  04/24/2021 4.50 4.14 - 5.80 x10E6/uL Final  09/12/2020 4.22 4.22 - 5.81 MIL/uL Final   Hemoglobin  Date Value Ref Range Status  04/24/2021 13.2 13.0 - 17.7 g/dL Final    Hematocrit  Date Value Ref Range Status  04/24/2021 39.8 37.5 - 51.0 % Final   MCHC  Date Value Ref Range Status  04/24/2021 33.2 31.5 - 35.7 g/dL Final  09/12/2020 32.3 30.0 - 36.0 g/dL Final   [facility]  Date Value Ref Range Status  04/24/2021 29.3 26.6 - 33.0 pg Final  09/12/2020 28.9 26.0 - 34.0 pg Final   MCV  Date Value Ref Range Status  04/24/2021 88 79 - 97 fL Final   No results found for: "PLTCOUNTKUC", "LABPLAT", "POCPLA" RDW  Date Value Ref Range Status  04/24/2021 12.3 11.6 - 15.4 % Final

## 2021-10-18 NOTE — Patient Instructions (Signed)

## 2021-10-23 ENCOUNTER — Encounter: Payer: Self-pay | Admitting: Nurse Practitioner

## 2021-10-23 ENCOUNTER — Ambulatory Visit (INDEPENDENT_AMBULATORY_CARE_PROVIDER_SITE_OTHER): Payer: PPO | Admitting: Nurse Practitioner

## 2021-10-23 VITALS — BP 130/68 | HR 66 | Temp 97.8°F | Ht 72.0 in | Wt 203.6 lb

## 2021-10-23 DIAGNOSIS — E785 Hyperlipidemia, unspecified: Secondary | ICD-10-CM

## 2021-10-23 DIAGNOSIS — E1122 Type 2 diabetes mellitus with diabetic chronic kidney disease: Secondary | ICD-10-CM

## 2021-10-23 DIAGNOSIS — E1169 Type 2 diabetes mellitus with other specified complication: Secondary | ICD-10-CM

## 2021-10-23 DIAGNOSIS — C679 Malignant neoplasm of bladder, unspecified: Secondary | ICD-10-CM | POA: Diagnosis not present

## 2021-10-23 DIAGNOSIS — I5022 Chronic systolic (congestive) heart failure: Secondary | ICD-10-CM | POA: Diagnosis not present

## 2021-10-23 DIAGNOSIS — I130 Hypertensive heart and chronic kidney disease with heart failure and stage 1 through stage 4 chronic kidney disease, or unspecified chronic kidney disease: Secondary | ICD-10-CM | POA: Diagnosis not present

## 2021-10-23 DIAGNOSIS — R7989 Other specified abnormal findings of blood chemistry: Secondary | ICD-10-CM | POA: Diagnosis not present

## 2021-10-23 DIAGNOSIS — I700 Atherosclerosis of aorta: Secondary | ICD-10-CM | POA: Diagnosis not present

## 2021-10-23 DIAGNOSIS — N1831 Chronic kidney disease, stage 3a: Secondary | ICD-10-CM

## 2021-10-23 DIAGNOSIS — I350 Nonrheumatic aortic (valve) stenosis: Secondary | ICD-10-CM | POA: Diagnosis not present

## 2021-10-23 DIAGNOSIS — D692 Other nonthrombocytopenic purpura: Secondary | ICD-10-CM | POA: Diagnosis not present

## 2021-10-23 DIAGNOSIS — D508 Other iron deficiency anemias: Secondary | ICD-10-CM

## 2021-10-23 LAB — BAYER DCA HB A1C WAIVED: HB A1C (BAYER DCA - WAIVED): 6.7 % — ABNORMAL HIGH (ref 4.8–5.6)

## 2021-10-23 MED ORDER — JANUMET XR 50-1000 MG PO TB24: 1.0000 | ORAL_TABLET | Freq: Two times a day (BID) | ORAL | 4 refills | Status: DC

## 2021-10-23 MED ORDER — LOSARTAN POTASSIUM 25 MG PO TABS
ORAL_TABLET | ORAL | 4 refills | Status: DC
Start: 2021-10-23 — End: 2022-10-26

## 2021-10-23 MED ORDER — FUROSEMIDE 40 MG PO TABS: 20.0000 mg | ORAL_TABLET | Freq: Every day | ORAL | 4 refills | Status: DC

## 2021-10-23 MED ORDER — ATORVASTATIN CALCIUM 40 MG PO TABS: ORAL_TABLET | ORAL | 4 refills | Status: DC

## 2021-10-23 MED ORDER — POTASSIUM CHLORIDE CRYS ER 10 MEQ PO TBCR
10.0000 meq | EXTENDED_RELEASE_TABLET | Freq: Every day | ORAL | 4 refills | Status: DC
Start: 2021-10-23 — End: 2022-10-26

## 2021-10-23 MED ORDER — METOPROLOL TARTRATE 25 MG PO TABS: 25.0000 mg | ORAL_TABLET | Freq: Two times a day (BID) | ORAL | 4 refills | Status: DC

## 2021-10-23 NOTE — Assessment & Plan Note (Signed)
Chronic, ongoing with A1c 6.7% today, stable.  Urine ALB 10 and A:C <30 (February 2023).   Losartan for kidney protection.  Continue current medication regimen and adjust as needed.   Avoid hypoglycemia due to advanced age.  Will refer him to nephrology if worsening kidney function presents. Return in 6 months.

## 2021-10-23 NOTE — Assessment & Plan Note (Signed)
Chronic, ongoing.  Continue current medication regimen and adjust as needed. Lipid panel today. 

## 2021-10-23 NOTE — Assessment & Plan Note (Addendum)
Chronic, stable with BP on recheck at goal for age.  Avoid hypotension. Continue current medication regimen and adjust as needed.  Urine ALB improved at 10 and A:C <30 (February 2023).  Losartan for kidney protection.  Refer to nephrology if worsening kidney function.  Recommend he monitor his BP regularly at home and follow DASH diet.  Continue to collaborate with cardiology.  LABS: CBC, CMP, TSH.  Return in 6 months.

## 2021-10-23 NOTE — Assessment & Plan Note (Signed)
Followed by cardiology.  Last echo June 2021.  Continue to collaborate with cardiology. 

## 2021-10-23 NOTE — Assessment & Plan Note (Signed)
Ongoing, stable.  Continue daily iron.  Recheck iron and B12 level today + CBC -- recent levels stable.  If ongoing stability will stop iron supplement. 

## 2021-10-23 NOTE — Assessment & Plan Note (Signed)
Chronic, stable.  Noted on imaging 02/08/18.  Continue daily medications for prevention, statin therapy. 

## 2021-10-23 NOTE — Assessment & Plan Note (Signed)
Chronic, stable with recent labs showing improved CRT and GFR.  Continue Losartan for kidney protection.  Refer to nephrology as needed.

## 2021-10-23 NOTE — Assessment & Plan Note (Signed)
As evidenced by scattered bruises bilaterall upper extremities.  Gentle skin cleansers at home and lotion.  Monitor for skin breakdown, report to provider if present.

## 2021-10-23 NOTE — Assessment & Plan Note (Signed)
Continue to collaborate with urology, at length discussion on goals of care will continue at visits.

## 2021-10-23 NOTE — Assessment & Plan Note (Signed)
Recheck thyroid labs today, history of elevations.  Start medication as needed. 

## 2021-10-23 NOTE — Assessment & Plan Note (Signed)
Chronic, ongoing.  Euvolemic today.  Continue collaboration with cardiology and current medication regimen.  Recommend: - Reminded to call for an overnight weight gain of >2 pounds or a weekly weight weight of >5 pounds - not adding salt to his food and has been reading food labels. Reviewed the importance of keeping daily sodium intake to '2000mg'$  daily  - Avoid NSAIDs

## 2021-10-23 NOTE — Progress Notes (Signed)
BP 130/68 (BP Location: Left Arm, Patient Position: Sitting, Cuff Size: Normal)   Pulse 66   Temp 97.8 F (36.6 C) (Oral)   Ht 6' (1.829 m)   Wt 203 lb 9.6 oz (92.4 kg)   SpO2 92%   BMI 27.61 kg/m    Subjective:    Patient ID: [name], male    DOB: 10-01-1940, 81 y.o.   MRN: [id number]  HPI: TEVON BERHANE is a 81 y.o. male  Chief Complaint  Patient presents with   Diabetes   Hyperlipidemia   Hypertension   Anemia   Bladder Cancer   DIABETES Taking Janumet XR 50-1000.  A1c February 6.4%. Hypoglycemic episodes:no Polydipsia/polyuria: no Visual disturbance: no Chest pain: no Paresthesias: no Glucose Monitoring: no             Accucheck frequency: Not Checking             Fasting glucose:             Post prandial:             Evening:             Before meals: Taking Insulin?: no             Long acting insulin:             Short acting insulin: Blood Pressure Monitoring: rarely Retinal Examination: Up To Date Foot Exam: Up to Date Pneumovax: Up to Date Influenza: Up to Date Aspirin: yes   HYPERTENSION / HYPERLIPIDEMIA/HF Taking Lasix 20 MG daily, Metoprolol 25 MG BID + Losartan 12.5 MG + Lipitor 40 MG, ASA. Saw cardiology 04/23/21, no medications changes. Has moderate underlying aortic stenosis noted on past imaging, continues on statin.    History of elevation of TSH on labs.  No symptoms.  Last echo 08/02/19 -- mild LV dysfunction with LVH, EF 45%.  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 Satisfied with current treatment? yes Duration of hypertension: chronic BP monitoring frequency: not checking BP range:  BP medication side effects: no Duration of hyperlipidemia: chronic Cholesterol medication side effects: no Cholesterol supplements: none Medication compliance: good compliance Aspirin: yes Recent stressors: no Recurrent headaches: no Visual changes: no Palpitations: no Dyspnea: no -- no orthopnea Chest pain: no Lower  extremity edema: yes, at baseline on and off Dizzy/lightheaded: no  ANEMIA History of low levels after cystoscopy and surgeries -- recent labs improving.  Continues iron and B12 at home. Anemia status: stable Etiology of anemia: ?surgery Duration of anemia treatment: months Compliance with treatment: good compliance Iron supplementation side effects: no Severity of anemia: mild Fatigue: no Decreased exercise tolerance: no  Dyspnea on exertion: no Palpitations: no Bleeding: no Pica: no   CHRONIC KIDNEY DISEASE Was stable on recent check. CKD status: controlled Medications renally dose: yes Previous renal evaluation: no Pneumovax:  Up to Date Influenza Vaccine:  Up to Date    BLADDER CANCER: Followed by urology, last visit with Dr. Bernardo Heater on 06/29/21 for cystoscopy -- was stable -- returns in November.  He does endorse that each time he has these procedures it takes longer to recover (2-3 days to recover), so he is pondering whether to continue doing these and ponders whether to go back period.     Relevant past medical, surgical, family and social history reviewed and updated as indicated. Interim medical history since our last visit reviewed. Allergies and medications reviewed and updated.  Review of Systems  Constitutional:  Negative for activity change, diaphoresis, fatigue and fever.  Respiratory:  Negative for cough, chest tightness, shortness of breath and wheezing.   Cardiovascular:  Negative for chest pain, palpitations and leg swelling.  Gastrointestinal: Negative.   Endocrine: Negative for cold intolerance, heat intolerance, polydipsia, polyphagia and polyuria.  Neurological: Negative.   Psychiatric/Behavioral: Negative.      Per HPI unless specifically indicated above     Objective:    BP 130/68 (BP Location: Left Arm, Patient Position: Sitting, Cuff Size: Normal)   Pulse 66   Temp 97.8 F (36.6 C) (Oral)   Ht 6' (1.829 m)   Wt 203 lb 9.6 oz (92.4 kg)    SpO2 92%   BMI 27.61 kg/m   Wt Readings from Last 3 Encounters:  10/23/21 203 lb 9.6 oz (92.4 kg)  06/29/21 203 lb (92.1 kg)  05/30/21 205 lb (93 kg)    Physical Exam Vitals and nursing note reviewed.  Constitutional:      General: He is awake. He is not in acute distress.    Appearance: He is well-developed and overweight. He is not ill-appearing.  HENT:     Head: Normocephalic and atraumatic.     Right Ear: Hearing normal. No drainage.     Left Ear: Hearing normal. No drainage.  Eyes:     General: Lids are normal.        Right eye: No discharge.        Left eye: No discharge.     Conjunctiva/sclera: Conjunctivae normal.     Pupils: Pupils are equal, round, and reactive to light.  Neck:     Thyroid: No thyromegaly.     Vascular: No carotid bruit.  Cardiovascular:     Rate and Rhythm: Normal rate and regular rhythm.     Heart sounds: S1 normal and S2 normal. Murmur heard.     Systolic murmur is present with a grade of 2/6.     No gallop.  Pulmonary:     Effort: Pulmonary effort is normal. No accessory muscle usage or respiratory distress.     Breath sounds: Normal breath sounds.  Abdominal:     General: Bowel sounds are normal.     Palpations: Abdomen is soft.  Musculoskeletal:        General: Normal range of motion.     Cervical back: Normal range of motion and neck supple.     Right lower leg: 1+ Edema present.     Left lower leg: 1+ Edema present.  Lymphadenopathy:     Cervical: No cervical adenopathy.  Skin:    General: Skin is warm and dry.     Capillary Refill: Capillary refill takes less than 2 seconds.     Comments: Scattered pale purple bruises noted to bilateral upper extremities, skin intact.  Neurological:     Mental Status: He is alert and oriented to person, place, and time.     Deep Tendon Reflexes: Reflexes are normal and symmetric.  Psychiatric:        Attention and Perception: Attention normal.        Mood and Affect: Mood normal.        Speech:  Speech normal.        Behavior: Behavior normal. Behavior is cooperative.        Thought Content: Thought content normal.    Results for orders placed or performed in visit on 10/23/21  Bayer DCA Hb A1c Waived  Result Value Ref Range   HB A1C (  BAYER DCA - WAIVED) 6.7 (H) 4.8 - 5.6 %      Assessment & Plan:   Problem List Items Addressed This Visit       Cardiovascular and Mediastinum   Aortic atherosclerosis (HCC)    Chronic, stable.  Noted on imaging 02/08/18.  Continue daily medications for prevention, statin therapy.      Relevant Medications   atorvastatin (LIPITOR) 40 MG tablet   furosemide (LASIX) 40 MG tablet   losartan (COZAAR) 25 MG tablet   metoprolol tartrate (LOPRESSOR) 25 MG tablet   Other Relevant Orders   Comprehensive metabolic panel   Lipid Panel w/o Chol/HDL Ratio   Aortic valve stenosis, moderate    Followed by cardiology.  Last echo June 2021.  Continue to collaborate with cardiology.      Relevant Medications   atorvastatin (LIPITOR) 40 MG tablet   furosemide (LASIX) 40 MG tablet   losartan (COZAAR) 25 MG tablet   metoprolol tartrate (LOPRESSOR) 25 MG tablet   Other Relevant Orders   Comprehensive metabolic panel   Lipid Panel w/o Chol/HDL Ratio   Chronic systolic heart failure (HCC)    Chronic, ongoing.  Euvolemic today.  Continue collaboration with cardiology and current medication regimen.  Recommend: - Reminded to call for an overnight weight gain of >2 pounds or a weekly weight weight of >5 pounds - not adding salt to his food and has been reading food labels. 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  Avoid hypotension. Continue current medication regimen and adjust as needed.  Urine ALB improved at 10 and A:C <30 (February 2023).  Losartan for kidney protection.  Refer to nephrology if worsening kidney function.  Recommend he monitor his BP regularly at home and follow DASH diet.  Continue to collaborate with cardiology.  LABS: CBC, CMP, TSH.  Return in 6 months.      Relevant Medications   atorvastatin (LIPITOR) 40 MG tablet   furosemide (LASIX) 40 MG tablet   losartan (COZAAR) 25 MG tablet   metoprolol tartrate (LOPRESSOR) 25 MG tablet   Other Relevant Orders   Comprehensive metabolic panel   Senile purpura ([place])    As evidenced by scattered bruises bilaterall upper extremities.  Gentle skin cleansers at home and lotion.  Monitor for skin breakdown, report to provider if present.      Relevant Medications   atorvastatin (LIPITOR) 40 MG tablet   furosemide (LASIX) 40 MG tablet   losartan (COZAAR) 25 MG tablet   metoprolol tartrate (LOPRESSOR) 25 MG tablet   Other Relevant Orders   CBC with Differential/Platelet     Endocrine   Hyperlipidemia associated with type 2 diabetes mellitus (HCC)    Chronic, ongoing.  Continue current medication regimen and adjust as needed.  Lipid panel today.      Relevant Medications   atorvastatin (LIPITOR) 40 MG tablet   furosemide (LASIX) 40 MG tablet   SitaGLIPtin-MetFORMIN HCl (JANUMET XR) 50-1000 MG TB24   losartan (COZAAR) 25 MG tablet   metoprolol tartrate (LOPRESSOR) 25 MG  tablet   Other Relevant Orders   Bayer DCA Hb A1c Waived (Completed)   Comprehensive metabolic panel   Lipid Panel w/o Chol/HDL Ratio   Type 2 diabetes mellitus with chronic kidney disease (HCC) - Primary    Chronic, ongoing with A1c 6.7% today, stable.  Urine ALB 10 and A:C <30 (February 2023).   Losartan for kidney protection.  Continue current medication regimen and adjust as needed.   Avoid hypoglycemia due  to advanced age.  Will refer him to nephrology if worsening kidney function presents. Return in 6 months.       Relevant Medications   atorvastatin (LIPITOR) 40 MG tablet   SitaGLIPtin-MetFORMIN HCl (JANUMET XR) 50-1000 MG TB24   losartan (COZAAR) 25 MG tablet   Other Relevant Orders   Bayer DCA Hb A1c Waived (Completed)     Genitourinary   Bladder cancer (HCC)    Continue to collaborate with urology, at length discussion on goals of care will continue at visits.      Relevant Orders   Comprehensive metabolic panel   CKD (chronic kidney disease) stage 3, GFR 30-59 ml/min (HCC)    Chronic, stable with recent labs showing improved CRT and GFR.  Continue Losartan for kidney protection.  Refer to nephrology as needed.      Relevant Orders   Comprehensive metabolic panel     Other   Elevated TSH    Recheck thyroid labs today, history of elevations.  Start medication as needed.      Relevant Orders   TSH   Thyroid peroxidase antibody   T4, free   Iron deficiency anemia    Ongoing, stable.  Continue daily iron.  Recheck iron and B12 level today + CBC -- recent levels stable.  If ongoing stability will stop iron supplement.      Relevant Medications   cyanocobalamin (VITAMIN B12) 1000 MCG tablet   Other Relevant Orders   CBC with Differential/Platelet   Iron Binding Cap (TIBC)([facility])   Ferritin     Follow up plan: Return in about 6 months (around 04/25/2022) for T2DM, HTN/HLD, BLADDER CA, ANEMIA.

## 2021-10-24 LAB — CBC WITH DIFFERENTIAL/PLATELET
Basophils Absolute: 0 10*3/uL (ref 0.0–0.2)
Basos: 0 %
EOS (ABSOLUTE): 0.4 10*3/uL (ref 0.0–0.4)
Eos: 7 %
Hematocrit: 38.7 % (ref 37.5–51.0)
Hemoglobin: 12.3 g/dL — ABNORMAL LOW (ref 13.0–17.7)
Immature Grans (Abs): 0 10*3/uL (ref 0.0–0.1)
Immature Granulocytes: 0 %
Lymphocytes Absolute: 1.5 10*3/uL (ref 0.7–3.1)
Lymphs: 26 %
MCH: 27.7 pg (ref 26.6–33.0)
MCHC: 31.8 g/dL (ref 31.5–35.7)
MCV: 87 fL (ref 79–97)
Monocytes Absolute: 0.4 10*3/uL (ref 0.1–0.9)
Monocytes: 7 %
Neutrophils Absolute: 3.4 10*3/uL (ref 1.4–7.0)
Neutrophils: 60 %
Platelets: 206 10*3/uL (ref 150–450)
RBC: 4.44 x10E6/uL (ref 4.14–5.80)
RDW: 12.8 % (ref 11.6–15.4)
WBC: 5.8 10*3/uL (ref 3.4–10.8)

## 2021-10-24 LAB — LIPID PANEL W/O CHOL/HDL RATIO
Cholesterol, Total: 120 mg/dL (ref 100–199)
HDL: 56 mg/dL (ref >39)
LDL Chol Calc (NIH): 47 mg/dL (ref 0–99)
Triglycerides: 85 mg/dL (ref 0–149)
VLDL Cholesterol Cal: 17 mg/dL (ref 5–40)

## 2021-10-24 LAB — IRON AND TIBC
Iron Saturation: 33 % (ref 15–55)
Iron: 81 ug/dL (ref 38–169)
Total Iron Binding Capacity: 244 ug/dL — ABNORMAL LOW (ref 250–450)
UIBC: 163 ug/dL (ref 111–343)

## 2021-10-24 LAB — COMPREHENSIVE METABOLIC PANEL
ALT: 17 IU/L (ref 0–44)
AST: 25 IU/L (ref 0–40)
Albumin/Globulin Ratio: 1.8 (ref 1.2–2.2)
Albumin: 4.2 g/dL (ref 3.7–4.7)
Alkaline Phosphatase: 69 IU/L (ref 44–121)
BUN/Creatinine Ratio: 20 (ref 10–24)
BUN: 23 mg/dL (ref 8–27)
Bilirubin Total: 0.5 mg/dL (ref 0.0–1.2)
CO2: 30 mmol/L — ABNORMAL HIGH (ref 20–29)
Calcium: 9.3 mg/dL (ref 8.6–10.2)
Chloride: 100 mmol/L (ref 96–106)
Creatinine, Ser: 1.15 mg/dL (ref 0.76–1.27)
Globulin, Total: 2.4 g/dL (ref 1.5–4.5)
Glucose: 117 mg/dL — ABNORMAL HIGH (ref 70–99)
Potassium: 4 mmol/L (ref 3.5–5.2)
Sodium: 144 mmol/L (ref 134–144)
Total Protein: 6.6 g/dL (ref 6.0–8.5)
eGFR: 63 mL/min/{1.73_m2} (ref >59)

## 2021-10-24 LAB — T4, FREE: Free T4: 1.08 ng/dL (ref 0.82–1.77)

## 2021-10-24 LAB — TSH: TSH: 4.66 u[IU]/mL — ABNORMAL HIGH (ref 0.450–4.500)

## 2021-10-24 LAB — FERRITIN: Ferritin: 172 ng/mL (ref 30–400)

## 2021-10-24 LAB — THYROID PEROXIDASE ANTIBODY: Thyroperoxidase Ab SerPl-aCnc: <9 IU/mL (ref 0–34)

## 2021-10-24 NOTE — Progress Notes (Signed)
Good morning Destiny, please let Peterson know his labs have returned: - Kidney function, creatinine and eGFR, remains normal, as is liver function, AST and ALT.   - Cholesterol levels are at goal, continue Atorvastatin. - Anemia is remaining stable, you can decrease iron to once daily dosing. - Thyroid levels continue to show some mild elevation in TSH and a Free T4 that is normal.  We will continue to monitor this and if the TSH increases closer to 6 level  or you start having symptoms (like increase fatigue, increase constipation, mood changes) then we will consider adding on medication.  Any questions? Keep being amazing!!  Thank you for allowing me to participate in your care.  I appreciate you. Kindest regards, Willena Jeancharles

## 2021-11-11 DIAGNOSIS — Z951 Presence of aortocoronary bypass graft: Secondary | ICD-10-CM | POA: Diagnosis not present

## 2021-11-11 DIAGNOSIS — Z952 Presence of prosthetic heart valve: Secondary | ICD-10-CM | POA: Diagnosis not present

## 2021-11-11 DIAGNOSIS — E785 Hyperlipidemia, unspecified: Secondary | ICD-10-CM | POA: Diagnosis not present

## 2021-11-11 DIAGNOSIS — Z23 Encounter for immunization: Secondary | ICD-10-CM | POA: Diagnosis not present

## 2021-11-11 DIAGNOSIS — I10 Essential (primary) hypertension: Secondary | ICD-10-CM | POA: Diagnosis not present

## 2021-11-11 DIAGNOSIS — I350 Nonrheumatic aortic (valve) stenosis: Secondary | ICD-10-CM | POA: Diagnosis not present

## 2021-11-20 DIAGNOSIS — Z23 Encounter for immunization: Secondary | ICD-10-CM | POA: Diagnosis not present

## 2021-11-24 DIAGNOSIS — Z872 Personal history of diseases of the skin and subcutaneous tissue: Secondary | ICD-10-CM | POA: Diagnosis not present

## 2021-11-24 DIAGNOSIS — C44229 Squamous cell carcinoma of skin of left ear and external auricular canal: Secondary | ICD-10-CM | POA: Diagnosis not present

## 2021-11-24 DIAGNOSIS — D485 Neoplasm of uncertain behavior of skin: Secondary | ICD-10-CM | POA: Diagnosis not present

## 2021-11-24 DIAGNOSIS — Z85828 Personal history of other malignant neoplasm of skin: Secondary | ICD-10-CM | POA: Diagnosis not present

## 2021-11-24 DIAGNOSIS — Z859 Personal history of malignant neoplasm, unspecified: Secondary | ICD-10-CM | POA: Diagnosis not present

## 2021-11-24 DIAGNOSIS — L570 Actinic keratosis: Secondary | ICD-10-CM | POA: Diagnosis not present

## 2021-11-24 DIAGNOSIS — L578 Other skin changes due to chronic exposure to nonionizing radiation: Secondary | ICD-10-CM | POA: Diagnosis not present

## 2021-12-03 ENCOUNTER — Ambulatory Visit (INDEPENDENT_AMBULATORY_CARE_PROVIDER_SITE_OTHER): Payer: PPO | Admitting: *Deleted

## 2021-12-03 DIAGNOSIS — Z0000 Encounter for general adult medical examination without abnormal findings: Secondary | ICD-10-CM

## 2021-12-03 NOTE — Patient Instructions (Signed)
Mr. John [name] , Thank you for taking time to come for your Medicare Wellness Visit. I appreciate your ongoing commitment to your health goals. Please review the following plan we discussed and let me know if I can assist you in the future.   Screening recommendations/referrals: Colonoscopy: no longer required Recommended yearly ophthalmology/optometry visit for glaucoma screening and checkup Recommended yearly dental visit for hygiene and checkup  Vaccinations: Influenza vaccine: Education provided Pneumococcal vaccine: up to date Tdap vaccine: Education provided Shingles vaccine: Education provided     Next appointment: 04-26-2021 @ 8:20  [place]  Preventive Care 44 Years and Older, Male Preventive care refers to lifestyle choices and visits with your health care provider that can promote health and wellness. What does preventive care include? A yearly physical exam. This is also called an annual well check. Dental exams once or twice a year. Routine eye exams. Ask your health care provider how often you should have your eyes checked. Personal lifestyle choices, including: Daily care of your teeth and gums. Regular physical activity. Eating a healthy diet. Avoiding tobacco and drug use. Limiting alcohol use. Practicing safe sex. Taking low doses of aspirin every day. Taking vitamin and mineral supplements as recommended by your health care provider. What happens during an annual well check? The services and screenings done by your health care provider during your annual well check will depend on your age, overall health, lifestyle risk factors, and family history of disease. Counseling  Your health care provider may ask you questions about your: Alcohol use. Tobacco use. Drug use. Emotional well-being. Home and relationship well-being. Sexual activity. Eating habits. History of falls. Memory and ability to understand (cognition). Work and work Statistician. Screening  You  may have the following tests or measurements: Height, weight, and BMI. Blood pressure. Lipid and cholesterol levels. These may be checked every 5 years, or more frequently if you are over 69 years old. Skin check. Lung cancer screening. You may have this screening every year starting at age 64 if you have a 30-pack-year history of smoking and currently smoke or have quit within the past 15 years. Fecal occult blood test (FOBT) of the stool. You may have this test every year starting at age 51. Flexible sigmoidoscopy or colonoscopy. You may have a sigmoidoscopy every 5 years or a colonoscopy every 10 years starting at age 68. Prostate cancer screening. Recommendations will vary depending on your family history and other risks. Hepatitis C blood test. Hepatitis B blood test. Sexually transmitted disease (STD) testing. Diabetes screening. This is done by checking your blood sugar (glucose) after you have not eaten for a while (fasting). You may have this done every 1-3 years. Abdominal aortic aneurysm (AAA) screening. You may need this if you are a current or former smoker. Osteoporosis. You may be screened starting at age 92 if you are at high risk. Talk with your health care provider about your test results, treatment options, and if necessary, the need for more tests. Vaccines  Your health care provider may recommend certain vaccines, such as: Influenza vaccine. This is recommended every year. Tetanus, diphtheria, and acellular pertussis (Tdap, Td) vaccine. You may need a Td booster every 10 years. Zoster vaccine. You may need this after age 1. Pneumococcal 13-valent conjugate (PCV13) vaccine. One dose is recommended after age 31. Pneumococcal polysaccharide (PPSV23) vaccine. One dose is recommended after age 59. Talk to your health care provider about which screenings and vaccines you need and how often you need them. This  information is not intended to replace advice given to you by your  health care provider. Make sure you discuss any questions you have with your health care provider. Document Released: 03/14/2015 Document Revised: 11/05/2015 Document Reviewed: 12/17/2014 Elsevier Interactive Patient Education  2017 [place] Prevention in the Home Falls can cause injuries. They can happen to people of all ages. There are many things you can do to make your home safe and to help prevent falls. What can I do on the outside of my home? Regularly fix the edges of walkways and driveways and fix any cracks. Remove anything that might make you trip as you walk through a door, such as a raised step or threshold. Trim any bushes or trees on the path to your home. Use bright outdoor lighting. Clear any walking paths of anything that might make someone trip, such as rocks or tools. Regularly check to see if handrails are loose or broken. Make sure that both sides of any steps have handrails. Any raised decks and porches should have guardrails on the edges. Have any leaves, snow, or ice cleared regularly. Use sand or salt on walking paths during winter. Clean up any spills in your garage right away. This includes oil or grease spills. What can I do in the bathroom? Use night lights. Install grab bars by the toilet and in the tub and shower. Do not use towel bars as grab bars. Use non-skid mats or decals in the tub or shower. If you need to sit down in the shower, use a plastic, non-slip stool. Keep the floor dry. Clean up any water that spills on the floor as soon as it happens. Remove soap buildup in the tub or shower regularly. Attach bath mats securely with double-sided non-slip rug tape. Do not have throw rugs and other things on the floor that can make you trip. What can I do in the bedroom? Use night lights. Make sure that you have a light by your bed that is easy to reach. Do not use any sheets or blankets that are too big for your bed. They should not hang down  onto the floor. Have a firm chair that has side arms. You can use this for support while you get dressed. Do not have throw rugs and other things on the floor that can make you trip. What can I do in the kitchen? Clean up any spills right away. Avoid walking on wet floors. Keep items that you use a lot in easy-to-reach places. If you need to reach something above you, use a strong step stool that has a grab bar. Keep electrical cords out of the way. Do not use floor polish or wax that makes floors slippery. If you must use wax, use non-skid floor wax. Do not have throw rugs and other things on the floor that can make you trip. What can I do with my stairs? Do not leave any items on the stairs. Make sure that there are handrails on both sides of the stairs and use them. Fix handrails that are broken or loose. Make sure that handrails are as long as the stairways. Check any carpeting to make sure that it is firmly attached to the stairs. Fix any carpet that is loose or worn. Avoid having throw rugs at the top or bottom of the stairs. If you do have throw rugs, attach them to the floor with carpet tape. Make sure that you have a light switch at the top  of the stairs and the bottom of the stairs. If you do not have them, ask someone to add them for you. What else can I do to help prevent falls? Wear shoes that: Do not have high heels. Have rubber bottoms. Are comfortable and fit you well. Are closed at the toe. Do not wear sandals. If you use a stepladder: Make sure that it is fully opened. Do not climb a closed stepladder. Make sure that both sides of the stepladder are locked into place. Ask someone to hold it for you, if possible. Clearly mark and make sure that you can see: Any grab bars or handrails. First and last steps. Where the edge of each step is. Use tools that help you move around (mobility aids) if they are needed. These include: Canes. Walkers. Scooters. Crutches. Turn  on the lights when you go into a dark area. Replace any light bulbs as soon as they burn out. Set up your furniture so you have a clear path. Avoid moving your furniture around. If any of your floors are uneven, fix them. If there are any pets around you, be aware of where they are. Review your medicines with your doctor. Some medicines can make you feel dizzy. This can increase your chance of falling. Ask your doctor what other things that you can do to help prevent falls. This information is not intended to replace advice given to you by your health care provider. Make sure you discuss any questions you have with your health care provider. Document Released: 12/12/2008 Document Revised: 07/24/2015 Document Reviewed: 03/22/2014 Elsevier Interactive Patient Education  2017 [employer].

## 2021-12-03 NOTE — Progress Notes (Signed)
Subjective:   John [name] is a 81 y.o. male who presents for Medicare Annual/Subsequent preventive examination.  I connected with  [name] on 12/03/21 by a telephone  enabled telemedicine application and verified that I am speaking with the correct person using two identifiers.   I discussed the limitations of evaluation and management by telemedicine. The patient expressed understanding and agreed to proceed.  Patient location: home  Provider location: Tele-Health-home   Review of Systems     Cardiac Risk Factors include: advanced age (>20mn, >>73women);diabetes mellitus;obesity (BMI >30kg/m2);family history of premature cardiovascular disease;male gender;hypertension     Objective:    Today's Vitals   There is no height or weight on file to calculate BMI.     12/03/2021   12:35 PM 05/30/2021    7:10 PM 12/01/2020    1:04 PM 09/16/2020    8:21 AM 09/04/2020    3:20 PM 08/11/2020   10:04 AM 07/21/2020    7:42 AM  Advanced Directives  Does Patient Have a Medical Advance Directive? No Yes Yes Yes Yes Yes No  Type of ACorporate treasurerof [place] will Out of facility DNR (pink MOST or yellow form)   [place] will   Does patient want to make changes to medical advance directive?    No - Patient declined  No - Patient declined   Copy of [place] Chart?      Yes - validated most recent copy scanned in chart (See row information)   Would patient like information on creating a medical advance directive? No - Patient declined      No - Patient declined    Current Medications (verified) Outpatient Encounter Medications as of 12/03/2021  Medication Sig   Ascorbic Acid (VITAMIN C) 1000 MG tablet Take 1,000 mg by mouth daily.   aspirin EC 81 MG tablet Take 81 mg by mouth daily. Swallow whole.   atorvastatin (LIPITOR) 40 MG tablet TAKE 1 TABLET BY MOUTH ONCE DAILY AT 6PM   cyanocobalamin (VITAMIN B12)  1000 MCG tablet Take 1,000 mcg by mouth daily.   furosemide (LASIX) 40 MG tablet Take 0.5 tablets (20 mg total) by mouth daily.   Iron, Ferrous Sulfate, 142 (45 Fe) MG TBCR Take 45 mg by mouth daily.   losartan (COZAAR) 25 MG tablet TAKE ONE-HALF TABLET BY MOUTH ONCE DAILY   metoprolol tartrate (LOPRESSOR) 25 MG tablet Take 1 tablet (25 mg total) by mouth 2 (two) times daily.   potassium chloride (KLOR-CON M) 10 MEQ tablet Take 1 tablet (10 mEq total) by mouth daily.   SitaGLIPtin-MetFORMIN HCl (JANUMET XR) 50-1000 MG TB24 Take 1 tablet by mouth 2 (two) times daily.   No facility-administered encounter medications on file as of 12/03/2021.    Allergies (verified) Patient has no known allergies.   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   37m Edwards Magna-Ease pericardial valve   Senile purpura ([place] 03/13/2018   Skin cancer of face    T2DM (type 2 diabetes mellitus) ([place] 2009   Past Surgical History:  Procedure Laterality Date   AORTIC VALVE REPLACEMENT N/A 05/05/2017   Procedure: AORTIC VALVE REPLACEMENT (AVR) USING MAGNA EASE PERICARDIAL BIOPROSTHESIS - AORTIC VALVE MODEL 3300TFX, SIZE 23 MM;  Surgeon: BGaye Pollack MD;  [place] [facility]  Service: Open Heart Surgery;  Laterality: N/A;   CATARACT EXTRACTION W/PHACO Right 07/21/2020   Procedure: CATARACT EXTRACTION PHACO AND INTRAOCULAR LENS PLACEMENT (IOC) RIGHT DIABETIC 9.09  00:51.3;  Surgeon: KEulogio Bear MD;  [place]  Service: Ophthalmology;  Laterality: Right;   CATARACT EXTRACTION W/PHACO Left 08/11/2020   Procedure: CATARACT EXTRACTION PHACO AND INTRAOCULAR LENS PLACEMENT ([place] LEFT DIABETIC;  Surgeon: KEulogio Bear MD;  [place] [facility]  Service: Ophthalmology;  Laterality: Left;  5.08 0:37.2   COLONOSCOPY     CORONARY ARTERY BYPASS GRAFT N/A 05/05/2017   Procedure: CORONARY ARTERY BYPASS GRAFTING (CABG) x 4 WITH ENDOSCOPIC HARVESTING OF RIGHT SAPHENOUS VEIN (4v; LIMA-LAD, SVG-OM, SVG-RI, SVG-PDA);  Surgeon: BGaye Pollack MD;  [place]  Service: Open Heart Surgery;  Laterality: N/A;   CYSTOURETHROSCOPY     JOINT REPLACEMENT Left    hip   KNEE ARTHROPLASTY Right 01/12/2016   Procedure: RIGHT  TOTAL KNEE ARTHROPLASTY WITH COMPUTER NAVIGATION;  Surgeon: BRod Can MD;  [place]  Service: Orthopedics;  Laterality: Right;  Needs RNFA   RIGHT/LEFT HEART CATH AND CORONARY ANGIOGRAPHY N/A 04/21/2017   Procedure: RIGHT/LEFT HEART CATH AND CORONARY ANGIOGRAPHY;  Surgeon: PIsaias Cowman MD;  [facility];  Service: Cardiovascular;  Laterality: N/A;   SKIN SURGERY Left    arm   TEE WITHOUT CARDIOVERSION N/A 05/05/2017   Procedure: TRANSESOPHAGEAL ECHOCARDIOGRAM (TEE);  Surgeon: BGaye Pollack MD;  [place]  Service: Open Heart Surgery;  Laterality: N/A;   TRANSURETHRAL RESECTION OF BLADDER TUMOR N/A 05/15/2019   Procedure: TRANSURETHRAL RESECTION OF BLADDER TUMOR (TURBT);  Surgeon: SAbbie Sons MD;  Location: [facility];  Service: Urology;  Laterality: N/A;   TRANSURETHRAL RESECTION OF BLADDER TUMOR N/A 09/16/2020   Procedure: TRANSURETHRAL RESECTION OF BLADDER TUMOR (TURBT);  Surgeon: SAbbie Sons MD;  Location: [facility];  Service: Urology;  Laterality: N/A;   TRANSURETHRAL RESECTION OF BLADDER TUMOR WITH MITOMYCIN-C N/A 03/14/2018   Procedure:  TRANSURETHRAL RESECTION OF BLADDER TUMOR WITH Gemcitabine;  Surgeon: SAbbie Sons MD;  Location: [facility];  Service: Urology;  Laterality: N/A;   Family History  Problem Relation Age of Onset   Dementia Mother    Stroke Mother    Stroke Father    Diabetes Father    Heart disease Father    Cancer Sister    Hypertension Brother    Cancer Sister    Social History   Socioeconomic History   Marital status: Married    Spouse name: Not on file   Number of children: Not on file   Years of education: Not on file   Highest education level: 10th grade  Occupational History   Occupation: retired  Tobacco Use   Smoking status: Former    Years: 25.00    Types: Cigarettes    Quit date: 12/15/1980  Years since quitting: 40.9   Smokeless tobacco: Never  Vaping Use   Vaping Use: Never used  Substance and Sexual Activity   Alcohol use: No    Alcohol/week: 0.0 standard drinks of alcohol   Drug use: No   Sexual activity: Yes  Other Topics Concern   Not on file  Social History Narrative   Not on file   Social Determinants of Health   Financial Resource Strain: Low Risk  (12/03/2021)   Overall Financial Resource Strain (CARDIA)    Difficulty of Paying Living Expenses: Not very hard  Food Insecurity: No Food Insecurity (12/03/2021)   Hunger Vital Sign    Worried About Running Out of Food in the Last Year: Never true    Ran Out of Food in the Last Year: Never true  Transportation Needs: No Transportation Needs (12/03/2021)   PRAPARE - Hydrologist (Medical): No    Lack of Transportation (Non-Medical): No  Physical Activity: Inactive (12/03/2021)   Exercise Vital Sign    Days of Exercise per Week: 0 days    Minutes of Exercise per Session: 0 min  Stress: No Stress Concern Present (12/03/2021)   [place]    Feeling of Stress : Not at all  Social Connections: Moderately Integrated  (12/03/2021)   Social Connection and Isolation Panel [NHANES]    Frequency of Communication with Friends and Family: More than three times a week    Frequency of Social Gatherings with Friends and Family: Three times a week    Attends Religious Services: More than 4 times per year    Active Member of Clubs or Organizations: No    Attends Archivist Meetings: Never    Marital Status: Married    Tobacco Counseling Counseling given: Not Answered   Clinical Intake:  Pre-visit preparation completed: Yes        Diabetes: Yes CBG done?: No Did pt. bring in CBG monitor from home?: No  How often do you need to have someone help you when you read instructions, pamphlets, or other written materials from your doctor or pharmacy?: 1 - Never  Diabetic?  Yes  Nutrition Risk Assessment:  Has the patient had any N/V/D within the last 2 months?  No  Does the patient have any non-healing wounds?  No  Has the patient had any unintentional weight loss or weight gain?  No   Diabetes:  Is the patient diabetic?  Yes  If diabetic, was a CBG obtained today?  No  Did the patient bring in their glucometer from home?  No  How often do you monitor your CBG's?  Does not check.   Financial Strains and Diabetes Management:  Are you having any financial strains with the device, your supplies or your medication? No .  Does the patient want to be seen by Chronic Care Management for management of their diabetes?  No  Would the patient like to be referred to a Nutritionist or for Diabetic Management?  No   Diabetic Exams:  Diabetic Eye Exam:. Pt has been advised about the importance in completing this exam  Diabetic Foot Exam: Pt has been advised about the importance in completing this exam. .    Interpreter Needed?: No  Information entered by :: Leroy Kennedy LPN   Activities of Daily Living    12/03/2021   12:43 PM  In your present state of health, do you have any difficulty  performing the following activities:  Hearing? 1  Vision? 0  Difficulty concentrating or making decisions? 0  Walking or climbing stairs? 0  Dressing or bathing? 0  Doing errands, shopping? 0  Preparing Food and eating ? N  Using the Toilet? N  In the past six months, have you accidently leaked urine? N  Do you have problems with loss of bowel control? N  Managing your Medications? N  Managing your Finances? N  Housekeeping or managing your Housekeeping? N    Patient Care Team: Venita Lick, NP as PCP - General (Nurse Practitioner) Rod Can, MD as Consulting Physician (Orthopedic Surgery) Isaias Cowman, MD as Consulting Physician (Cardiology) Gaye Pollack, MD as Consulting Physician (Cardiothoracic Surgery) Ree Edman, MD (Dermatology) Minor, Dalbert Garnet, RN (Inactive) as [place] any recent Medical Services you may have received from other than [facility] providers in the past year (date may be approximate).     Assessment:   This is a routine wellness examination for [place].  Hearing/Vision screen Hearing Screening - Comments:: No hearing aids Vision Screening - Comments:: Ellin Mayhew Up to date  Dietary issues and exercise activities discussed: Current Exercise Habits: The patient does not participate in regular exercise at present   Goals Addressed             This Visit's Progress    DIET - [place]   On track    Recommend drinking at least 6-8 glasses of water a day      Patient Stated   On track    11/26/2019, no goals     Patient Stated   On track    12/01/2020, no goals     Patient Stated       No goals       Depression Screen    12/03/2021   12:41 PM 12/01/2020    1:07 PM 03/12/2020    8:24 AM 11/26/2019   10:35 AM 02/28/2019    8:03 AM 11/15/2018   10:49 AM 11/15/2018   10:42 AM  PHQ 2/9 Scores  PHQ - 2 Score 0 0 0 0 0 0 0  PHQ- 9 Score 2  0        Fall Risk     12/03/2021   12:35 PM 12/01/2020    1:06 PM 03/12/2020    8:23 AM 11/26/2019   10:34 AM 09/25/2019    4:08 PM  Fall Risk   Falls in the past year? 0 1 0 0 0  Comment  fell in bathtub   Emmi Telephone Survey: data to providers prior to load  Number falls in past yr: 0 0     Injury with Fall? 0 1     Risk for fall due to :  Medication side effect;Impaired mobility  Impaired balance/gait;Medication side effect   Follow up Falls evaluation completed;Education provided;Falls prevention discussed Falls evaluation completed;Education provided;Falls prevention discussed  Falls evaluation completed;Education provided;Falls prevention discussed     FALL RISK PREVENTION PERTAINING TO THE HOME:  Any stairs in or around the home? Yes  If so, are there any without handrails? No  Home free of loose throw rugs in walkways, pet beds, electrical cords, etc? Yes  Adequate lighting in your home to reduce risk of falls? Yes   ASSISTIVE DEVICES UTILIZED TO PREVENT FALLS:  Life alert? No  Use of a cane, walker or w/c? No  Grab bars in the bathroom? No  Shower chair or  bench in shower? No  Elevated toilet seat or a handicapped toilet? No   TIMED UP AND GO:  Was the test performed? No .    Cognitive Function:        12/03/2021   12:37 PM 12/01/2020    1:09 PM 11/26/2019   10:38 AM 02/28/2019   10:42 AM 11/03/2017    8:23 AM  6CIT Screen  What Year? 0 points 0 points 0 points 0 points 0 points  What month? 0 points 0 points 0 points 0 points 0 points  What time? 0 points 0 points 0 points 0 points 0 points  Count back from 20 0 points 0 points 0 points 0 points 0 points  Months in reverse 0 points 0 points 0 points 0 points 0 points  Repeat phrase 0 points 0 points 0 points 0 points 0 points  Total Score 0 points 0 points 0 points 0 points 0 points    Immunizations Immunization History  Administered Date(s) Administered   Fluad Quad(high Dose 65+) 12/13/2018, 12/10/2019, 12/15/2020    Influenza, High Dose Seasonal PF 11/25/2015, 11/03/2016, 11/03/2017   Influenza,inj,Quad PF,6+ Mos 11/18/2014   PFIZER(Purple Top)SARS-COV-2 Vaccination 03/12/2019, 04/02/2019, 12/20/2019, 07/18/2020   Pneumococcal Conjugate-13 11/23/2013   Pneumococcal Polysaccharide-23 05/24/2014   Zoster, Live 05[birth year]19/1957    TDAP status: Due, Education has been provided regarding the importance of this vaccine. Advised may receive this vaccine at local pharmacy or Health Dept. Aware to provide a copy of the vaccination record if obtained from local pharmacy or Health Dept. Verbalized acceptance and understanding.  Flu Vaccine status: Due, Education has been provided regarding the importance of this vaccine. Advised may receive this vaccine at local pharmacy or Health Dept. Aware to provide a copy of the vaccination record if obtained from local pharmacy or Health Dept. Verbalized acceptance and understanding.  Pneumococcal vaccine status: Up to date  Covid-19 vaccine status: Information provided on how to obtain vaccines.   Qualifies for Shingles Vaccine? Yes   Zostavax completed Yes   Shingrix Completed?: No.    Education has been provided regarding the importance of this vaccine. Patient has been advised to call insurance company to determine out of pocket expense if they have not yet received this vaccine. Advised may also receive vaccine at local pharmacy or Health Dept. Verbalized acceptance and understanding.  Screening Tests Health Maintenance  Topic Date Due   COVID-19 Vaccine (5 - Pfizer risk series) 09/12/2020   INFLUENZA VACCINE  09/29/2021   Zoster Vaccines- Shingrix (1 of 2) 01/23/2022 (Originally 03/25/1959)   TETANUS/TDAP  10/24/2022 (Originally 03/25/1959)   FOOT EXAM  01/15/2022   Diabetic kidney evaluation - Urine ACR  04/24/2022   HEMOGLOBIN A1C  04/25/2022   OPHTHALMOLOGY EXAM  09/08/2022   Diabetic kidney evaluation - GFR measurement  10/24/2022   Pneumonia Vaccine 37+ Years old   Completed   HPV VACCINES  Aged Out   COLONOSCOPY (Pts 45-5yr Insurance coverage will need to be confirmed)  Discontinued    Health Maintenance  Health Maintenance Due  Topic Date Due   COVID-19 Vaccine (5 - Pfizer risk series) 09/12/2020   INFLUENZA VACCINE  09/29/2021    Colorectal cancer screening: No longer required.   Lung Cancer Screening: (Low Dose CT Chest recommended if Age 83-80years, 30 pack-year currently smoking OR have quit w/in 15years.) does not qualify.   Lung Cancer Screening Referral:   Additional Screening:  Hepatitis C Screening: does not qualify;  Vision Screening: Recommended annual  ophthalmology exams for early detection of glaucoma and other disorders of the eye. Is the patient up to date with their annual eye exam?  Yes  Who is the provider or what is the name of the office in which the patient attends annual eye exams? [facility] If pt is not established with a provider, would they like to be referred to a provider to establish care? No .   Dental Screening: Recommended annual dental exams for proper oral hygiene  Community Resource Referral / Chronic Care Management: CRR required this visit?  No   CCM required this visit?  No      Plan:     I have personally reviewed and noted the following in the patient's chart:   Medical and social history Use of alcohol, tobacco or illicit drugs  Current medications and supplements including opioid prescriptions. Patient is not currently taking opioid prescriptions. Functional ability and status Nutritional status Physical activity Advanced directives List of other physicians Hospitalizations, surgeries, and ER visits in previous 12 months Vitals Screenings to include cognitive, depression, and falls Referrals and appointments  In addition, I have reviewed and discussed with patient certain preventive protocols, quality metrics, and best practice recommendations. A written personalized care plan  for preventive services as well as general preventive health recommendations were provided to patient.     Leroy Kennedy, LPN   [id number]/04/[id number]   Nurse Notes:

## 2021-12-04 ENCOUNTER — Ambulatory Visit: Payer: PPO

## 2022-01-01 ENCOUNTER — Encounter: Payer: Self-pay | Admitting: Urology

## 2022-01-01 ENCOUNTER — Ambulatory Visit: Payer: PPO | Admitting: Urology

## 2022-01-01 VITALS — BP 127/70 | HR 66 | Ht 72.0 in | Wt 203.0 lb

## 2022-01-01 DIAGNOSIS — Z8551 Personal history of malignant neoplasm of bladder: Secondary | ICD-10-CM | POA: Diagnosis not present

## 2022-01-01 DIAGNOSIS — D494 Neoplasm of unspecified behavior of bladder: Secondary | ICD-10-CM

## 2022-01-01 LAB — MICROSCOPIC EXAMINATION: Bacteria, UA: NONE SEEN

## 2022-01-01 LAB — URINALYSIS, COMPLETE
Bilirubin, UA: NEGATIVE
Glucose, UA: NEGATIVE
Ketones, UA: NEGATIVE
Leukocytes,UA: NEGATIVE
Nitrite, UA: NEGATIVE
Protein,UA: NEGATIVE
RBC, UA: NEGATIVE
Specific Gravity, UA: 1.015 (ref 1.005–1.030)
Urobilinogen, Ur: 0.2 mg/dL (ref 0.2–1.0)
pH, UA: 5.5 (ref 5.0–7.5)

## 2022-01-01 MED ORDER — LEVOFLOXACIN 500 MG PO TABS
500.0000 mg | ORAL_TABLET | Freq: Once | ORAL | Status: AC
  Administered 2022-01-01: 500 mg via ORAL

## 2022-01-01 NOTE — Progress Notes (Signed)
   01/01/22  CC:  No chief complaint on file.  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 male presents for 6 month surveillance cystoscopy  Refer to rooming tab for vitals  Cystoscopy Procedure Note  Patient identification was confirmed, informed consent was obtained, and patient was prepped using Betadine solution.  Lidocaine jelly was administered per urethral meatus.     Pre-Procedure: - Inspection reveals a normal caliber urethral meatus.  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

## 2022-01-05 DIAGNOSIS — C44229 Squamous cell carcinoma of skin of left ear and external auricular canal: Secondary | ICD-10-CM | POA: Diagnosis not present

## 2022-01-05 DIAGNOSIS — L988 Other specified disorders of the skin and subcutaneous tissue: Secondary | ICD-10-CM | POA: Diagnosis not present

## 2022-04-24 NOTE — Patient Instructions (Incomplete)
Start Magnesium 400 MG every night.  Diabetes Mellitus Basics  Diabetes mellitus, or diabetes, is a long-term (chronic) disease. It occurs when the body does not properly use sugar (glucose) that is released from food after you eat. Diabetes mellitus may be caused by one or both of these problems: Your pancreas does not make enough of a hormone called insulin. Your body does not react in a normal way to the insulin that it makes. Insulin lets glucose enter cells in your body. This gives you energy. If you have diabetes, glucose cannot get into cells. This causes high blood glucose (hyperglycemia). How to treat and manage diabetes You may need to take insulin or other diabetes medicines daily to keep your glucose in balance. If you are prescribed insulin, you will learn how to give yourself insulin by injection. You may need to adjust the amount of insulin you take based on the foods that you eat. You will need to check your blood glucose levels using a glucose monitor as told by your health care provider. The readings can help determine if you have low or high blood glucose. Generally, you should have these blood glucose levels: Before meals (preprandial): 80-130 mg/dL (1.6-1.0 mmol/L). After meals (postprandial): below 180 mg/dL (10 mmol/L). Hemoglobin A1c (HbA1c) level: less than 7%. Your health care provider will set treatment goals for you. Keep all follow-up visits. This is important. Follow these instructions at home: Diabetes medicines Take your diabetes medicines every day as told by your health care provider. List your diabetes medicines here: Name of medicine: ______________________________ Amount (dose): _______________ Time (a.m./p.m.): _______________ Notes: ___________________________________ Name of medicine: ______________________________ Amount (dose): _______________ Time (a.m./p.m.): _______________ Notes: ___________________________________ Name of medicine:  ______________________________ Amount (dose): _______________ Time (a.m./p.m.): _______________ Notes: ___________________________________ Insulin If you use insulin, list the types of insulin you use here: Insulin type: ______________________________ Amount (dose): _______________ Time (a.m./p.m.): _______________Notes: ___________________________________ Insulin type: ______________________________ Amount (dose): _______________ Time (a.m./p.m.): _______________ Notes: ___________________________________ Insulin type: ______________________________ Amount (dose): _______________ Time (a.m./p.m.): _______________ Notes: ___________________________________ Insulin type: ______________________________ Amount (dose): _______________ Time (a.m./p.m.): _______________ Notes: ___________________________________ Insulin type: ______________________________ Amount (dose): _______________ Time (a.m./p.m.): _______________ Notes: ___________________________________ Managing blood glucose  Check your blood glucose levels using a glucose monitor as told by your health care provider. Write down the times that you check your glucose levels here: Time: _______________ Notes: ___________________________________ Time: _______________ Notes: ___________________________________ Time: _______________ Notes: ___________________________________ Time: _______________ Notes: ___________________________________ Time: _______________ Notes: ___________________________________ Time: _______________ Notes: ___________________________________  Low blood glucose Low blood glucose (hypoglycemia) is when glucose is at or below 70 mg/dL (3.9 mmol/L). Symptoms may include: Feeling: Hungry. Sweaty and clammy. Irritable or easily upset. Dizzy. Sleepy. Having: A fast heartbeat. A headache. A change in your vision. Numbness around the mouth, lips, or tongue. Having trouble with: Moving  (coordination). Sleeping. Treating low blood glucose To treat low blood glucose, eat or drink something containing sugar right away. If you can think clearly and swallow safely, follow the 15:15 rule: Take 15 grams of a fast-acting carb (carbohydrate), as told by your health care provider. Some fast-acting carbs are: Glucose tablets: take 3-4 tablets. Hard candy: eat 3-5 pieces. Fruit juice: drink 4 oz (120 mL). Regular (not diet) soda: drink 4-6 oz (120-180 mL). Honey or sugar: eat 1 Tbsp (15 mL). Check your blood glucose levels 15 minutes after you take the carb. If your glucose is still at or below 70 mg/dL (3.9 mmol/L), take 15 grams of a carb again. If your glucose does not  go above 70 mg/dL (3.9 mmol/L) after 3 tries, get help right away. After your glucose goes back to normal, eat a meal or a snack within 1 hour. Treating very low blood glucose If your glucose is at or below 54 mg/dL (3 mmol/L), you have very low blood glucose (severe hypoglycemia). This is an emergency. Do not wait to see if the symptoms will go away. Get medical help right away. Call your local emergency services (911 in the [place] Do not drive yourself to the hospital. Questions to ask your health care provider Should I talk with a diabetes educator? What equipment will I need to care for myself at home? What diabetes medicines do I need? When should I take them? How often do I need to check my blood glucose levels? What number can I call if I have questions? When is my follow-up visit? Where can I find a support group for people with diabetes? Where to find more information American Diabetes Association: www.diabetes.org Association of Diabetes Care and Education Specialists: www.diabeteseducator.org Contact a health care provider if: Your blood glucose is at or above 240 mg/dL (16.1 mmol/L) for 2 days in a row. You have been sick or have had a fever for 2 days or more, and you are not getting better. You  have any of these problems for more than 6 hours: You cannot eat or drink. You feel nauseous. You vomit. You have diarrhea. Get help right away if: Your blood glucose is lower than 54 mg/dL (3 mmol/L). You get confused. You have trouble thinking clearly. You have trouble breathing. These symptoms may represent a serious problem that is an emergency. Do not wait to see if the symptoms will go away. Get medical help right away. Call your local emergency services (911 in the [place] Do not drive yourself to the hospital. Summary Diabetes mellitus is a chronic disease that occurs when the body does not properly use sugar (glucose) that is released from food after you eat. Take insulin and diabetes medicines as told. Check your blood glucose every day, as often as told. Keep all follow-up visits. This is important. This information is not intended to replace advice given to you by your health care provider. Make sure you discuss any questions you have with your health care provider. Document Revised: 06/19/2019 Document Reviewed: 06/19/2019 Elsevier Patient Education  2023 [employer].

## 2022-04-26 ENCOUNTER — Ambulatory Visit (INDEPENDENT_AMBULATORY_CARE_PROVIDER_SITE_OTHER): Payer: PPO | Admitting: Nurse Practitioner

## 2022-04-26 ENCOUNTER — Encounter: Payer: Self-pay | Admitting: Nurse Practitioner

## 2022-04-26 VITALS — BP 134/66 | HR 55 | Temp 97.8°F | Ht 72.01 in | Wt 202.3 lb

## 2022-04-26 DIAGNOSIS — D692 Other nonthrombocytopenic purpura: Secondary | ICD-10-CM | POA: Diagnosis not present

## 2022-04-26 DIAGNOSIS — C679 Malignant neoplasm of bladder, unspecified: Secondary | ICD-10-CM

## 2022-04-26 DIAGNOSIS — E1169 Type 2 diabetes mellitus with other specified complication: Secondary | ICD-10-CM

## 2022-04-26 DIAGNOSIS — E1129 Type 2 diabetes mellitus with other diabetic kidney complication: Secondary | ICD-10-CM

## 2022-04-26 DIAGNOSIS — E1159 Type 2 diabetes mellitus with other circulatory complications: Secondary | ICD-10-CM | POA: Diagnosis not present

## 2022-04-26 DIAGNOSIS — I152 Hypertension secondary to endocrine disorders: Secondary | ICD-10-CM | POA: Diagnosis not present

## 2022-04-26 DIAGNOSIS — D508 Other iron deficiency anemias: Secondary | ICD-10-CM | POA: Diagnosis not present

## 2022-04-26 DIAGNOSIS — E785 Hyperlipidemia, unspecified: Secondary | ICD-10-CM | POA: Diagnosis not present

## 2022-04-26 DIAGNOSIS — I350 Nonrheumatic aortic (valve) stenosis: Secondary | ICD-10-CM

## 2022-04-26 DIAGNOSIS — R7989 Other specified abnormal findings of blood chemistry: Secondary | ICD-10-CM | POA: Diagnosis not present

## 2022-04-26 DIAGNOSIS — R809 Proteinuria, unspecified: Secondary | ICD-10-CM

## 2022-04-26 DIAGNOSIS — I5022 Chronic systolic (congestive) heart failure: Secondary | ICD-10-CM

## 2022-04-26 DIAGNOSIS — I700 Atherosclerosis of aorta: Secondary | ICD-10-CM | POA: Diagnosis not present

## 2022-04-26 DIAGNOSIS — Z23 Encounter for immunization: Secondary | ICD-10-CM

## 2022-04-26 LAB — MICROALBUMIN, URINE WAIVED
Creatinine, Urine Waived: 100 mg/dL (ref 10–300)
Microalb, Ur Waived: 30 mg/L — ABNORMAL HIGH (ref 0–19)

## 2022-04-26 LAB — BAYER DCA HB A1C WAIVED: HB A1C (BAYER DCA - WAIVED): 6.6 % — ABNORMAL HIGH (ref 4.8–5.6)

## 2022-04-26 NOTE — Assessment & Plan Note (Signed)
Chronic, ongoing. Continue current medication regimen and adjust as needed.  Lipid panel today. 

## 2022-04-26 NOTE — Assessment & Plan Note (Signed)
Chronic, ongoing with A1c 6.6% today, stable for age.  Urine ALB 30 (February 2024).   Losartan for kidney protection.  Continue current medication regimen and adjust as needed.   Avoid hypoglycemia due to advanced age.  Will refer him to nephrology if worsening kidney function presents. Return in 6 months.

## 2022-04-26 NOTE — Assessment & Plan Note (Signed)
Ongoing, stable.  Continue daily iron.  Recheck iron and B12 level today + CBC -- recent levels stable.  If ongoing stability will stop iron supplement.

## 2022-04-26 NOTE — Progress Notes (Addendum)
BP 134/66 (BP Location: Left Arm, Patient Position: Sitting, Cuff Size: Normal)   Pulse (!) 55   Temp 97.8 F (36.6 C) (Oral)   Ht 6' 0.01" (1.829 m)   Wt 202 lb 4.8 oz (91.8 kg)   SpO2 98%   BMI 27.43 kg/m    Subjective:    Patient ID: John [name], male    DOB: December 29, 1940, 82 y.o.   MRN: [id number]  HPI: John [name] is a 82 y.o. male  Chief Complaint  Patient presents with   Diabetes   Hypertension   Hyperlipidemia   Anemia   Bladder Cancer   DIABETES Taking Janumet XR 50-1000.  A1c August was 6.7%. Hypoglycemic episodes:no Polydipsia/polyuria: no Visual disturbance: no Chest pain: no Paresthesias: no Glucose Monitoring: no             Accucheck frequency: Not Checking             Fasting glucose:             Post prandial:             Evening:             Before meals: Taking Insulin?: no             Long acting insulin:             Short acting insulin: Blood Pressure Monitoring: rarely Retinal Examination: Up To Date Foot Exam: Up to Date Pneumovax: Up to Date Influenza: Up to Date Aspirin: yes   HYPERTENSION / HYPERLIPIDEMIA/HF Saw cardiology 11/11/21, they recommended magnesium nightly due to him complaining of toes burning like fire at night 3 to 4 times a week -- he does not recall them recommending this and has not started. He will go to sleep with the burning and wake up with no pain.  Continues Lasix 20 MG daily, Metoprolol 25 MG BID + Losartan 12.5 MG + Lipitor 40 MG, ASA.  Has moderate underlying aortic stenosis noted on past imaging, continues on statin.    History of elevation of TSH on labs.  No symptoms and recent levels stable.  Last echo 08/02/19 -- mild LV dysfunction with LVH, EF 45%.  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 Satisfied with current treatment? yes Duration of hypertension: chronic BP monitoring frequency: not checking BP range:  BP medication side effects: no Duration of hyperlipidemia:  chronic Cholesterol medication side effects: no Cholesterol supplements: none Medication compliance: good compliance Aspirin: yes Recent stressors: no Recurrent headaches: no Visual changes: no Palpitations: no Dyspnea: no -- no orthopnea Chest pain: no Lower extremity edema: yes, at baseline on and off to top of feet Dizzy/lightheaded: no  ANEMIA History of low levels after cystoscopy and surgeries -- recent labs have been stable.  Continues iron and B12 at home. Anemia status: stable Etiology of anemia: ?surgery Duration of anemia treatment: months Compliance with treatment: good compliance Iron supplementation side effects: no Severity of anemia: mild Fatigue: no Decreased exercise tolerance: no  Dyspnea on exertion: no Palpitations: no Bleeding: no Pica: no   BLADDER CANCER: Followed by urology, last visit with Dr. Bernardo Heater on 01/01/22 for cystoscopy -- tolerated procedure this time and it was clean he reports. He reports that each time he has these procedures it takes longer to recover (2-3 days to recover), so he is pondering whether to continue doing these.       04/26/2022    8:40 AM 12/03/2021   12:41  PM 12/01/2020    1:07 PM 03/12/2020    8:24 AM 11/26/2019   10:35 AM  Depression screen PHQ 2/9  Decreased Interest 0 0 0 0 0  Down, Depressed, Hopeless 0 0 0 0 0  PHQ - 2 Score 0 0 0 0 0  Altered sleeping 3 2  0   Tired, decreased energy 0 0  0   Change in appetite 0 0  0   Feeling bad or failure about yourself  0 0  0   Trouble concentrating 0 0  0   Moving slowly or fidgety/restless 0 0  0   Suicidal thoughts 0 0  0   PHQ-9 Score 3 2  0   Difficult doing work/chores Not difficult at all Not difficult at all          04/26/2022    8:40 AM  GAD 7 : Generalized Anxiety Score  Nervous, Anxious, on Edge 0  Control/stop worrying 0  Worry too much - different things 0  Trouble relaxing 0  Restless 0  Easily annoyed or irritable 0  Afraid - awful might happen 0   Total GAD 7 Score 0  Anxiety Difficulty Not difficult at all   Relevant past medical, surgical, family and social history reviewed and updated as indicated. Interim medical history since our last visit reviewed. Allergies and medications reviewed and updated.  Review of Systems  Constitutional:  Negative for activity change, diaphoresis, fatigue and fever.  Respiratory:  Negative for cough, chest tightness, shortness of breath and wheezing.   Cardiovascular:  Negative for chest pain, palpitations and leg swelling.  Gastrointestinal: Negative.   Endocrine: Negative for cold intolerance, heat intolerance, polydipsia, polyphagia and polyuria.  Neurological: Negative.   Psychiatric/Behavioral: Negative.      Per HPI unless specifically indicated above     Objective:    BP 134/66 (BP Location: Left Arm, Patient Position: Sitting, Cuff Size: Normal)   Pulse (!) 55   Temp 97.8 F (36.6 C) (Oral)   Ht 6' 0.01" (1.829 m)   Wt 202 lb 4.8 oz (91.8 kg)   SpO2 98%   BMI 27.43 kg/m   Wt Readings from Last 3 Encounters:  04/26/22 202 lb 4.8 oz (91.8 kg)  01/01/22 203 lb (92.1 kg)  10/23/21 203 lb 9.6 oz (92.4 kg)    Physical Exam Vitals and nursing note reviewed.  Constitutional:      General: He is awake. He is not in acute distress.    Appearance: He is well-developed and overweight. He is not ill-appearing.  HENT:     Head: Normocephalic and atraumatic.     Right Ear: Hearing normal. No drainage.     Left Ear: Hearing normal. No drainage.  Eyes:     General: Lids are normal.        Right eye: No discharge.        Left eye: No discharge.     Conjunctiva/sclera: Conjunctivae normal.     Pupils: Pupils are equal, round, and reactive to light.  Neck:     Thyroid: No thyromegaly.     Vascular: No carotid bruit.  Cardiovascular:     Rate and Rhythm: Normal rate and regular rhythm.     Heart sounds: S1 normal and S2 normal. Murmur heard.     Systolic murmur is present with a  grade of 2/6.     No gallop.  Pulmonary:     Effort: Pulmonary effort is normal. No accessory muscle  usage or respiratory distress.     Breath sounds: Normal breath sounds.  Abdominal:     General: Bowel sounds are normal.     Palpations: Abdomen is soft.  Musculoskeletal:        General: Normal range of motion.     Cervical back: Normal range of motion and neck supple.     Right lower leg: 1+ Edema present.     Left lower leg: 1+ Edema present.  Lymphadenopathy:     Cervical: No cervical adenopathy.  Skin:    General: Skin is warm and dry.     Capillary Refill: Capillary refill takes less than 2 seconds.     Comments: Scattered pale purple bruises noted to bilateral upper extremities, skin intact.  Neurological:     Mental Status: He is alert and oriented to person, place, and time.     Deep Tendon Reflexes: Reflexes are normal and symmetric.  Psychiatric:        Attention and Perception: Attention normal.        Mood and Affect: Mood normal.        Speech: Speech normal.        Behavior: Behavior normal. Behavior is cooperative.        Thought Content: Thought content normal.    Diabetic Foot Exam - Simple   Simple Foot Form Visual Inspection No deformities, no ulcerations, no other skin breakdown bilaterally: Yes Sensation Testing Intact to touch and monofilament testing bilaterally: Yes Pulse Check Posterior Tibialis and Dorsalis pulse intact bilaterally: Yes Comments     Results for orders placed or performed in visit on 01/01/22  Microscopic Examination   Urine  Result Value Ref Range   WBC, UA 0-5 0 - 5 /hpf   RBC, Urine 0-2 0 - 2 /hpf   Epithelial Cells (non renal) 0-10 0 - 10 /hpf   Bacteria, UA None seen None seen/Few  Urinalysis, Complete  Result Value Ref Range   Specific Gravity, UA 1.015 1.005 - 1.030   pH, UA 5.5 5.0 - 7.5   Color, UA Yellow Yellow   Appearance Ur Clear Clear   Leukocytes,UA Negative Negative   Protein,UA Negative Negative/Trace    Glucose, UA Negative Negative   Ketones, UA Negative Negative   RBC, UA Negative Negative   Bilirubin, UA Negative Negative   Urobilinogen, Ur 0.2 0.2 - 1.0 mg/dL   Nitrite, UA Negative Negative   Microscopic Examination See below:       Assessment & Plan:   Problem List Items Addressed This Visit       Cardiovascular and Mediastinum   Aortic atherosclerosis (HCC)    Chronic, stable.  Noted on imaging 02/08/18.  Continue daily medications for prevention, statin therapy.      Relevant Orders   Comprehensive metabolic panel   Lipid Panel w/o Chol/HDL Ratio   Aortic valve stenosis, moderate    Followed by cardiology.  Last echo June 2021.  Continue to collaborate with cardiology.      Relevant Orders   Comprehensive metabolic panel   Lipid Panel w/o Chol/HDL Ratio   Chronic systolic heart failure (HCC)    Chronic, ongoing.  Euvolemic today.  Continue collaboration with cardiology and current medication regimen.  Recommend: - Reminded to call for an overnight weight gain of >2 pounds or a weekly weight weight of >5 pounds - not adding salt to his food and has been reading food labels. Reviewed the importance of keeping daily sodium intake to '2000mg'$   daily  - Avoid NSAIDs - Would benefit repeat echo if symptoms worsen      Relevant Orders   Comprehensive metabolic panel   Hypertension associated with type 2 diabetes mellitus (HCC)    Chronic, stable with BP on recheck at goal for age.  Avoid hypotension. Continue current medication regimen and adjust as needed.  Urine ALB 30 (February 2024).  Losartan for kidney protection.  Refer to nephrology if worsening kidney function.  Recommend he monitor his BP regularly at home and follow DASH diet.  Continue to collaborate with cardiology.  LABS: CBC, CMP, TSH, urine ALB.  Return in 6 months.      Relevant Orders   Bayer DCA Hb A1c Waived   Microalbumin, Urine Waived   Comprehensive metabolic panel   Senile purpura ([place])     Ongoing.  As evidenced by scattered bruises bilaterall upper extremities.  Gentle skin cleansers at home and lotion.  Monitor for skin breakdown, report to provider if present.      Relevant Orders   CBC with Differential/Platelet     Endocrine   Hyperlipidemia associated with type 2 diabetes mellitus (HCC)    Chronic, ongoing.  Continue current medication regimen and adjust as needed.  Lipid panel today.      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  Urine ALB 30 (February 2024).   Losartan for kidney protection.  Continue current medication regimen and adjust as needed.   Avoid hypoglycemia due to advanced age.  Will refer him to nephrology if worsening kidney function presents. Return in 6 months.       Relevant Orders   Bayer DCA Hb A1c Waived   Microalbumin, Urine Waived   Comprehensive metabolic panel     Genitourinary   Bladder cancer (HCC)    Chronic.  Continue to collaborate with urology, at length discussion on goals of care will continue at visits.      Relevant Orders   Comprehensive metabolic panel     Other   Elevated TSH    Recheck thyroid labs today, history of elevations.  Start medication as needed.      Relevant Orders   T4, free   TSH   Iron deficiency anemia    Ongoing, stable.  Continue daily iron.  Recheck iron and B12 level today + CBC -- recent levels stable.  If ongoing stability will stop iron supplement.      Relevant Orders   Iron Binding Cap (TIBC)([facility])   Ferritin   CBC with Differential/Platelet   Other Visit Diagnoses     Need for Td vaccine       Td vaccine today.   Relevant Orders   Td vaccine greater than or equal to 7yo preservative free IM (Completed)        Follow up plan: Return in about 6 months (around 10/25/2022) for T2DM,  HTN/HLD/HF, BLADDER CA.

## 2022-04-26 NOTE — Assessment & Plan Note (Signed)
Chronic, stable.  Noted on imaging 02/08/18.  Continue daily medications for prevention, statin therapy.

## 2022-04-26 NOTE — Assessment & Plan Note (Signed)
Chronic.  Continue to collaborate with urology, at length discussion on goals of care will continue at visits.

## 2022-04-26 NOTE — Assessment & Plan Note (Signed)
Recheck thyroid labs today, history of elevations.  Start medication as needed.

## 2022-04-26 NOTE — Assessment & Plan Note (Signed)
Followed by cardiology.  Last echo June 2021.  Continue to collaborate with cardiology.

## 2022-04-26 NOTE — Assessment & Plan Note (Signed)
Ongoing.  As evidenced by scattered bruises bilaterall upper extremities.  Gentle skin cleansers at home and lotion.  Monitor for skin breakdown, report to provider if present.

## 2022-04-26 NOTE — Assessment & Plan Note (Signed)
Chronic, ongoing.  Euvolemic today.  Continue collaboration with cardiology and current medication regimen.  Recommend: - Reminded to call for an overnight weight gain of >2 pounds or a weekly weight weight of >5 pounds - not adding salt to his food and has been reading food labels. Reviewed the importance of keeping daily sodium intake to '2000mg'$  daily  - Avoid NSAIDs - Would benefit repeat echo if symptoms worsen

## 2022-04-26 NOTE — Assessment & Plan Note (Signed)
Chronic, stable with BP on recheck at goal for age.  Avoid hypotension. Continue current medication regimen and adjust as needed.  Urine ALB 30 (February 2024).  Losartan for kidney protection.  Refer to nephrology if worsening kidney function.  Recommend he monitor his BP regularly at home and follow DASH diet.  Continue to collaborate with cardiology.  LABS: CBC, CMP, TSH, urine ALB.  Return in 6 months.

## 2022-04-27 LAB — COMPREHENSIVE METABOLIC PANEL
ALT: 22 IU/L (ref 0–44)
AST: 25 IU/L (ref 0–40)
Albumin/Globulin Ratio: 1.7 (ref 1.2–2.2)
Albumin: 4.3 g/dL (ref 3.7–4.7)
Alkaline Phosphatase: 72 IU/L (ref 44–121)
BUN/Creatinine Ratio: 18 (ref 10–24)
BUN: 18 mg/dL (ref 8–27)
Bilirubin Total: 0.6 mg/dL (ref 0.0–1.2)
CO2: 28 mmol/L (ref 20–29)
Calcium: 9.4 mg/dL (ref 8.6–10.2)
Chloride: 102 mmol/L (ref 96–106)
Creatinine, Ser: 1.02 mg/dL (ref 0.76–1.27)
Globulin, Total: 2.6 g/dL (ref 1.5–4.5)
Glucose: 121 mg/dL — ABNORMAL HIGH (ref 70–99)
Potassium: 4.3 mmol/L (ref 3.5–5.2)
Sodium: 143 mmol/L (ref 134–144)
Total Protein: 6.9 g/dL (ref 6.0–8.5)
eGFR: 72 mL/min/{1.73_m2} (ref >59)

## 2022-04-27 LAB — IRON AND TIBC
Iron Saturation: 47 % (ref 15–55)
Iron: 114 ug/dL (ref 38–169)
Total Iron Binding Capacity: 245 ug/dL — ABNORMAL LOW (ref 250–450)
UIBC: 131 ug/dL (ref 111–343)

## 2022-04-27 LAB — CBC WITH DIFFERENTIAL/PLATELET
Basophils Absolute: 0 10*3/uL (ref 0.0–0.2)
Basos: 0 %
EOS (ABSOLUTE): 0.4 10*3/uL (ref 0.0–0.4)
Eos: 8 %
Hematocrit: 40.3 % (ref 37.5–51.0)
Hemoglobin: 12.8 g/dL — ABNORMAL LOW (ref 13.0–17.7)
Immature Grans (Abs): 0 10*3/uL (ref 0.0–0.1)
Immature Granulocytes: 0 %
Lymphocytes Absolute: 1.6 10*3/uL (ref 0.7–3.1)
Lymphs: 29 %
MCH: 28.4 pg (ref 26.6–33.0)
MCHC: 31.8 g/dL (ref 31.5–35.7)
MCV: 89 fL (ref 79–97)
Monocytes Absolute: 0.4 10*3/uL (ref 0.1–0.9)
Monocytes: 7 %
Neutrophils Absolute: 3.1 10*3/uL (ref 1.4–7.0)
Neutrophils: 56 %
Platelets: 185 10*3/uL (ref 150–450)
RBC: 4.51 x10E6/uL (ref 4.14–5.80)
RDW: 12.6 % (ref 11.6–15.4)
WBC: 5.6 10*3/uL (ref 3.4–10.8)

## 2022-04-27 LAB — LIPID PANEL W/O CHOL/HDL RATIO
Cholesterol, Total: 122 mg/dL (ref 100–199)
HDL: 58 mg/dL (ref >39)
LDL Chol Calc (NIH): 49 mg/dL (ref 0–99)
Triglycerides: 76 mg/dL (ref 0–149)
VLDL Cholesterol Cal: 15 mg/dL (ref 5–40)

## 2022-04-27 LAB — FERRITIN: Ferritin: 206 ng/mL (ref 30–400)

## 2022-04-27 LAB — T4, FREE: Free T4: 1.16 ng/dL (ref 0.82–1.77)

## 2022-04-27 LAB — TSH: TSH: 4.33 u[IU]/mL (ref 0.450–4.500)

## 2022-04-27 NOTE — Progress Notes (Signed)
Contacted via [place] [facility], your labs have returned and overall remain stable: - Kidney function, creatinine and eGFR, remains normal, as is liver function, AST and ALT.  - Iron levels stable, you could reduce taking your iron to every other day and we will continue to monitor. - Overall no other medication changes needed, any questions? Keep being amazing!!  Thank you for allowing me to participate in your care.  I appreciate you. Kindest regards, Shai Rasmussen

## 2022-05-17 DIAGNOSIS — I350 Nonrheumatic aortic (valve) stenosis: Secondary | ICD-10-CM | POA: Diagnosis not present

## 2022-05-17 DIAGNOSIS — Z952 Presence of prosthetic heart valve: Secondary | ICD-10-CM | POA: Diagnosis not present

## 2022-05-17 DIAGNOSIS — I10 Essential (primary) hypertension: Secondary | ICD-10-CM | POA: Diagnosis not present

## 2022-05-17 DIAGNOSIS — Z951 Presence of aortocoronary bypass graft: Secondary | ICD-10-CM | POA: Diagnosis not present

## 2022-05-17 DIAGNOSIS — E785 Hyperlipidemia, unspecified: Secondary | ICD-10-CM | POA: Diagnosis not present

## 2022-05-17 DIAGNOSIS — E119 Type 2 diabetes mellitus without complications: Secondary | ICD-10-CM | POA: Diagnosis not present

## 2022-05-25 DIAGNOSIS — Z859 Personal history of malignant neoplasm, unspecified: Secondary | ICD-10-CM | POA: Diagnosis not present

## 2022-05-25 DIAGNOSIS — Z85828 Personal history of other malignant neoplasm of skin: Secondary | ICD-10-CM | POA: Diagnosis not present

## 2022-05-25 DIAGNOSIS — L570 Actinic keratosis: Secondary | ICD-10-CM | POA: Diagnosis not present

## 2022-05-25 DIAGNOSIS — Z872 Personal history of diseases of the skin and subcutaneous tissue: Secondary | ICD-10-CM | POA: Diagnosis not present

## 2022-05-25 DIAGNOSIS — L578 Other skin changes due to chronic exposure to nonionizing radiation: Secondary | ICD-10-CM | POA: Diagnosis not present

## 2022-07-01 ENCOUNTER — Other Ambulatory Visit: Payer: PPO | Admitting: Urology

## 2022-07-01 ENCOUNTER — Other Ambulatory Visit: Payer: Self-pay | Admitting: Urology

## 2022-07-01 ENCOUNTER — Ambulatory Visit: Payer: PPO | Admitting: Urology

## 2022-07-01 ENCOUNTER — Encounter: Payer: Self-pay | Admitting: Urology

## 2022-07-01 VITALS — BP 150/70 | HR 80 | Ht 72.0 in | Wt 202.0 lb

## 2022-07-01 DIAGNOSIS — D494 Neoplasm of unspecified behavior of bladder: Secondary | ICD-10-CM

## 2022-07-01 DIAGNOSIS — N35912 Unspecified bulbous urethral stricture, male: Secondary | ICD-10-CM

## 2022-07-01 DIAGNOSIS — N400 Enlarged prostate without lower urinary tract symptoms: Secondary | ICD-10-CM | POA: Diagnosis not present

## 2022-07-01 DIAGNOSIS — Z8551 Personal history of malignant neoplasm of bladder: Secondary | ICD-10-CM | POA: Diagnosis not present

## 2022-07-01 LAB — URINALYSIS, COMPLETE
Bilirubin, UA: NEGATIVE
Glucose, UA: NEGATIVE
Ketones, UA: NEGATIVE
Leukocytes,UA: NEGATIVE
Nitrite, UA: NEGATIVE
Protein,UA: NEGATIVE
RBC, UA: NEGATIVE
Specific Gravity, UA: 1.015 (ref 1.005–1.030)
Urobilinogen, Ur: 0.2 mg/dL (ref 0.2–1.0)
pH, UA: 5 (ref 5.0–7.5)

## 2022-07-01 LAB — MICROSCOPIC EXAMINATION

## 2022-07-01 MED ORDER — CIPROFLOXACIN HCL 500 MG PO TABS
500.0000 mg | ORAL_TABLET | Freq: Once | ORAL | Status: AC
  Administered 2022-07-01: 500 mg via ORAL

## 2022-07-01 NOTE — Progress Notes (Signed)
    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 male presents for 6 month surveillance cystoscopy  Refer to rooming tab for vitals  Cystoscopy Procedure Note  Patient identification was confirmed, informed consent was obtained, and patient was prepped using Betadine solution.  Lidocaine jelly was administered per urethral meatus.     Pre-Procedure: - Inspection reveals a normal caliber urethral meatus.  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  We discussed the procedure in detail including potential risks of bleeding, infection and bladder perforation/injury.  All questions were answered and he desires to schedule. Will plan post resection intravesical gemcitabine Levaquin 500 mg post procedure due to history post cystoscopy UTI    Riki Altes, MD

## 2022-07-01 NOTE — Progress Notes (Signed)
Surgical Physician Order Form [facility] [place]  * Scheduling expectation : Next Available  *Length of Case: 30 minutes  *Clearance needed: no  *Anticoagulation Instructions: N/A  *Aspirin Instructions: Hold Aspirin  *Post-op visit Date/Instructions:  1-2 week with pathology review  *Diagnosis: Bladder Tumor  *Procedure:  TURBT with Gemcitabine instillation   Additional orders: Gemcitabine 2000mg  bladder instillation  -Admit type: OUTpatient  -Anesthesia: Choice  -VTE Prophylaxis Standing Order SCD's       Other:   -Standing Lab Orders Per Anesthesia    Lab other: UA&Urine Culture  -Standing Test orders EKG/Chest x-ray per Anesthesia       Test other:   - Medications:  Ancef 2gm IV  -Other orders:  N/A

## 2022-07-01 NOTE — Progress Notes (Signed)
John [name] presents for a procedure visit. BP today is _150/70_____. He is complaint with BP medication. Greater than 140/90. Provider  notified. Pt advised to__f/u with PCP____. Pt voiced understanding.

## 2022-07-06 ENCOUNTER — Telehealth: Payer: Self-pay

## 2022-07-06 NOTE — Progress Notes (Signed)
  Phone Number: [phone] for Surgical Coordinator Fax Number: [phone]  REQUEST FOR SURGICAL CLEARANCE       Date: Date: 07/06/22  Faxed to: Dr. Darrold Junker, MD  Surgeon: Dr. 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 YES []      NO  []   Appointment scheduled for: _______________________   Further recommendations: ____________________________________     Physician Signature:__________________________________   Printed Name: ________________________________________   Date: _________________

## 2022-07-06 NOTE — Telephone Encounter (Signed)
I spoke with Mr. Lather. We have discussed possible surgery dates and Tuesday June 11th, 2024 was agreed upon by all parties. Patient given information about surgery date, what to expect pre-operatively and post operatively.  We discussed that a Pre-Admission Testing office will be calling to set up the pre-op visit that will take place prior to surgery, and that these appointments are typically done over the phone with a Pre-Admissions RN. Informed patient that our office will communicate any additional care to be provided after surgery. Patients questions or concerns were discussed during our call. Advised to call our office should there be any additional information, questions or concerns that arise. Patient verbalized understanding.

## 2022-07-06 NOTE — Progress Notes (Signed)
   [facility] Posting Form  Surgery Date: Date: 08/10/2022  Surgeon: Dr. 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

## 2022-07-09 ENCOUNTER — Telehealth: Payer: Self-pay

## 2022-07-09 NOTE — Telephone Encounter (Signed)
Spoke with pt. Pt. Advised to hold ASA for 5 days per clearance received back from Dr. Darrold Junker.  Patient verbalized understanding.

## 2022-08-02 ENCOUNTER — Encounter
Admission: RE | Admit: 2022-08-02 | Discharge: 2022-08-02 | Disposition: A | Discharge status: Home / Self Care | Payer: PPO | Source: Ambulatory Visit | Attending: Urology | Admitting: Urology

## 2022-08-02 ENCOUNTER — Other Ambulatory Visit: Payer: Self-pay

## 2022-08-02 ENCOUNTER — Other Ambulatory Visit: Payer: PPO

## 2022-08-02 DIAGNOSIS — R809 Proteinuria, unspecified: Secondary | ICD-10-CM | POA: Insufficient documentation

## 2022-08-02 DIAGNOSIS — E1129 Type 2 diabetes mellitus with other diabetic kidney complication: Secondary | ICD-10-CM

## 2022-08-02 DIAGNOSIS — Z01812 Encounter for preprocedural laboratory examination: Secondary | ICD-10-CM

## 2022-08-02 DIAGNOSIS — Z01810 Encounter for preprocedural cardiovascular examination: Secondary | ICD-10-CM | POA: Diagnosis not present

## 2022-08-02 DIAGNOSIS — Z01818 Encounter for other preprocedural examination: Secondary | ICD-10-CM | POA: Insufficient documentation

## 2022-08-02 DIAGNOSIS — D494 Neoplasm of unspecified behavior of bladder: Secondary | ICD-10-CM

## 2022-08-02 LAB — URINALYSIS, COMPLETE
Bilirubin, UA: NEGATIVE
Ketones, UA: NEGATIVE
Leukocytes,UA: NEGATIVE
Nitrite, UA: NEGATIVE
Protein,UA: NEGATIVE
RBC, UA: NEGATIVE
Specific Gravity, UA: 1.015 (ref 1.005–1.030)
Urobilinogen, Ur: 0.2 mg/dL (ref 0.2–1.0)
pH, UA: 7 (ref 5.0–7.5)

## 2022-08-02 LAB — CBC
HCT: 37.7 % — ABNORMAL LOW (ref 39.0–52.0)
Hemoglobin: 12.2 g/dL — ABNORMAL LOW (ref 13.0–17.0)
MCH: 29.3 pg (ref 26.0–34.0)
MCHC: 32.4 g/dL (ref 30.0–36.0)
MCV: 90.4 fL (ref 80.0–100.0)
Platelets: 209 10*3/uL (ref 150–400)
RBC: 4.17 MIL/uL — ABNORMAL LOW (ref 4.22–5.81)
RDW: 12.8 % (ref 11.5–15.5)
WBC: 6.6 10*3/uL (ref 4.0–10.5)
nRBC: 0 % (ref 0.0–0.2)

## 2022-08-02 LAB — BASIC METABOLIC PANEL
Anion gap: 8 (ref 5–15)
BUN: 20 mg/dL (ref 8–23)
CO2: 29 mmol/L (ref 22–32)
Calcium: 8.9 mg/dL (ref 8.9–10.3)
Chloride: 104 mmol/L (ref 98–111)
Creatinine, Ser: 1.09 mg/dL (ref 0.61–1.24)
GFR, Estimated: >60 mL/min (ref >60)
Glucose, Bld: 108 mg/dL — ABNORMAL HIGH (ref 70–99)
Potassium: 4 mmol/L (ref 3.5–5.1)
Sodium: 141 mmol/L (ref 135–145)

## 2022-08-02 LAB — MICROSCOPIC EXAMINATION

## 2022-08-02 NOTE — Patient Instructions (Addendum)
Your procedure is scheduled on: 08/10/22 - Tuesday Report to the Registration Desk on the 1st floor of the [facility]. To find out your arrival time, please call [phone] between 1PM - 3PM on: 08/11/22 - Monday If your arrival time is 6:00 am, do not arrive before that time as the [facility] entrance doors do not open until 6:00 am.  REMEMBER: Instructions that are not followed completely may result in serious medical risk, up to and including death; or upon the discretion of your surgeon and anesthesiologist your surgery may need to be rescheduled.  Do not eat food or drink any liquids after midnight the night before surgery.  No gum chewing or hard candies.   One week prior to surgery: Stop Anti-inflammatories (NSAIDS) such as Advil, Aleve, Ibuprofen, Motrin, Naproxen, Naprosyn and Aspirin based products such as Excedrin, Goody's Powder, BC Powder.  Stop ANY OVER THE COUNTER supplements until after surgery.  You may however, continue to take Tylenol if needed for pain up until the day of surgery.  Continue taking all prescribed medications with the exception of the following:  aspirin EC hold 5 days prior to your surgery beginning 08/05/22. SitaGLIPtin-MetFORMIN HCl (JANUMET XR) hold 3 days prior beginning 08/07/22.   TAKE ONLY THESE MEDICATIONS THE MORNING OF SURGERY WITH A SIP OF WATER:  metoprolol tartrate (LOPRESSOR)    No Alcohol for 24 hours before or after surgery.  No Smoking including e-cigarettes for 24 hours before surgery.  No chewable tobacco products for at least 6 hours before surgery.  No nicotine patches on the day of surgery.  Do not use any "recreational" drugs for at least a week (preferably 2 weeks) before your surgery.  Please be advised that the combination of cocaine and anesthesia may have negative outcomes, up to and including death. If you test positive for cocaine, your surgery will be cancelled.  On the morning of surgery brush your  teeth with toothpaste and water, you may rinse your mouth with mouthwash if you wish. Do not swallow any toothpaste or mouthwash.  Do not wear jewelry, make-up, hairpins, clips or nail polish.  Do not wear lotions, powders, or perfumes.   Do not shave body hair from the neck down 48 hours before surgery.  Contact lenses, hearing aids and dentures may not be worn into surgery.  Do not bring valuables to the hospital. [facility] is not responsible for any missing/lost belongings or valuables.   Notify your doctor if there is any change in your medical condition (cold, fever, infection).  Wear comfortable clothing (specific to your surgery type) to the hospital.  After surgery, you can help prevent lung complications by doing breathing exercises.  Take deep breaths and cough every 1-2 hours. Your doctor may order a device called an Incentive Spirometer to help you take deep breaths. When coughing or sneezing, hold a pillow firmly against your incision with both hands. This is called "splinting." Doing this helps protect your incision. It also decreases belly discomfort.  If you are being admitted to the hospital overnight, leave your suitcase in the car. After surgery it may be brought to your room.  In case of increased patient census, it may be necessary for you, the patient, to continue your postoperative care in the Same Day Surgery department.  If you are being discharged the day of surgery, you will not be allowed to drive home. You will need a responsible individual to drive you home and stay with you for  24 hours after surgery.   If you are taking public transportation, you will need to have a responsible individual with you.  Please call the Pre-admissions Testing Dept. at [phone] if you have any questions about these instructions.  Surgery Visitation Policy:  Patients having surgery or a procedure may have two visitors.  Children under the age of 24 must have an adult  with them who is not the patient.  Inpatient Visitation:    Visiting hours are 7 a.m. to 8 p.m. Up to four visitors are allowed at one time in a patient room. The visitors may rotate out with other people during the day.  One visitor age 66 or older may stay with the patient overnight and must be in the room by 8 p.m.

## 2022-08-04 LAB — CULTURE, URINE COMPREHENSIVE

## 2022-08-05 LAB — CULTURE, URINE COMPREHENSIVE

## 2022-08-06 ENCOUNTER — Encounter: Payer: Self-pay | Admitting: Urology

## 2022-08-06 NOTE — Progress Notes (Signed)
Perioperative / Anesthesia Services  Pre-Admission Testing Clinical Review / Preoperative Anesthesia Consult  Date: 08/06/22  Patient Demographics:  Name: John [name] DOB:   1940-09-17 MRN:   [id number]  Planned Surgical Procedure(s):    Case: [id number] Date/Time: 08/10/22 0808   Procedures:      TRANSURETHRAL RESECTION OF BLADDER TUMOR (TURBT)     BLADDER INSTILLATION OF GEMCITABINE   Anesthesia type: Choice   Pre-op diagnosis: Bladder Tumor   Location: [facility] OR ROOM 10 / [facility]S FOR ANEST[facility]ESIA GROUP   Surgeons: Riki Altes, MD     NOTE: Available PAT nursing documentation and vital signs have been reviewed. Clinical nursing staff has updated patient's PMH/PSHx, current medication list, and drug allergies/intolerances to ensure comprehensive history available to assist in medical decision making as it pertains to the aforementioned surgical procedure and anticipated anesthetic course. Extensive review of available clinical information personally performed. [facility] and PSH[facility] updated with any diagnoses/procedures that  may have been inadvertently omitted during his intake with the pre-admission testing department's nursing staff.  Clinical Discussion:  John [name] is a 82 y.o. male who is submitted for pre-surgical anesthesia review and clearance prior to him undergoing the above procedure.  Patient is a Former Smoker (quit 11/1980). Pertinent PMH includes: CAD (s/p CABG), severe aortic stenosis (s/p AVR), CHF, angina, cardiac murmur, aortic atherosclerosis, HTN, HLD, T2DM, CKD-III, pulmonary hypertension, dyspnea, anemia, OA   Patient is followed by cardiology Darrold Junker, MD). He was last [name] in the cardiology clinic on 05/17/2022; notes reviewed. At the time of his clinic visit, patient doing well overall from a cardiovascular perspective.  Patient with intermittent peripheral edema, however notes it is no worse than normal and controlled on currently prescribed  regimen.  Patient denied any chest pain, shortness of breath, PND, orthopnea, palpitations, weakness, fatigue, vertiginous symptoms, or presyncope/syncope. Patient with a past medical history significant for cardiovascular diagnoses. Documented physical exam was grossly benign, providing no evidence of acute exacerbation and/or decompensation of the patient's known cardiovascular conditions.  Patient underwent diagnostic right/left heart catheterization on 04/21/2017 that revealed significant three-vessel CAD.  There was 70% stenosis of the proximal LAD, 80% stenosis of the ostial LCx, 90% stenosis of the ostial ramus intermedius and 75% stenosis of the proximal PDA.  Cardiologist was unable to cross calcific aortic valve due to severe aortic stenosis.  There was mild pulmonary hypertension with a mean PA pressure of 34 mmHg.  Recommendations were for CVTS consult for consideration of CABG with AVR.   Long-term cardiac event monitor study performed on 06/07/2017 revealed a predominant underlying sinus rhythm with a mean heart rate 74 bpm.  There were frequent PVCs, occasional brief runs, and no evidence of definitive atrial fibrillation or atrial flutter.  Patient underwent a four-vessel CABG procedure on 05/05/2017 at [facility] performed by Dr. Evelene Croon, MD.  LIMA-LAD, SVG-RI, SVG-OM, and SVG-PDA bypass grafts were placed.  Patient also underwent aortic valve replacement whereby a 23 mm [facility] Ease pericardial valve was placed.  TEE was performed revealing excellent function of bioprosthetic valve with a small perivalvular leak.  Aortic valve mean gradient 8 mmHg.   Last TTE performed on 08/02/2019 revealed a mildly reduced left ventricular systolic function with an EF of 55%.  There were no regional wall motion abnormalities.  Right ventricular size and function normal.  Mitral annular calcification observed.  Bioprosthetic aortic valve noted to be seated well and functioning properly;  peak velocity 195.5 cm/s with  a peak gradient of 15.3 mmHg.  There was mild mitral and tricuspid valve regurgitation.  All other transvalvular gradients were noted to be normal providing no evidence suggestive of valvular stenosis.  RVSP 29.5 mmHg.  Blood pressure reasonably controlled at 140/80 mmHg on currently prescribed diuretic (furosemide), ARB (losartan), and beta blocker (metoprolol tartrate) therapies.  Patient is on atorvastatin for his HLD diagnosis and ASCVD prevention. T2DM well controlled on currently prescribed regimen; last HgbA1c was 6.6% when checked on 04/26/2022. He does not have an OSAH diagnosis.  Patient remains active at baseline and reported that he worked outdoors a lot.  Patient has no formal exercise regimen.Functional capacity, as defined by DASI, is documented as being >/= 4 METS. No changes were made to his medication regimen during his visit with cardiology.  Patient scheduled to follow-up with outpatient cardiology in 6 months or sooner if needed.  John [name] is scheduled for a TRANSURETHRAL RESECTION OF BLADDER TUMOR (TURBT); BLADDER INSTILLATION OF GEMCITABINE on 08/10/2022 with Dr. Irineo Axon, MD.  Given patient's past medical history significant for cardiovascular diagnoses, presurgical cardiac clearance was sought by the PAT team. Per cardiology, "this patient is optimized for surgery and may proceed with the planned procedural course with a LOW risk of significant perioperative cardiovascular complications".  In review of his medication reconciliation, it is noted that patient is currently on prescribed daily antithrombotic therapy. He has been instructed on recommendations for holding his daily low-dose ASA for 5 days prior to his procedure with plans to restart as soon as postoperative bleeding risk felt to be minimized by his attending surgeon. The patient has been instructed that his last dose of his ASA should be on 08/04/2022.  Patient denies previous  perioperative complications with anesthesia in the past. In review of the available records, it is noted that patient underwent a general anesthetic course here at [facility] ([facility]) in 08/2020 without documented complications.      07/01/2022    2:23 PM 07/01/2022    2:01 PM 04/26/2022    9:02 AM  Vitals with BMI  Height  6\' 0"    Weight  202 lbs   BMI  27.39   Systolic 150 155 742  Diastolic 70 71 66  Pulse 80 72     Providers/Specialists:   NOTE: Primary physician provider listed below. Patient may have been [name] by APP or partner within same practice.   PROVIDER ROLE / SPECIALTY LAST Winfield Cunas, MD Urology (Surgeon) 07/01/2022  Marjie Skiff, NP Primary Care Provider 04/26/2022  Marcina Millard, MD Cardiology 05/17/2022   Allergies:  Patient has no known allergies.  Current Home Medications:   No current facility-administered medications for this encounter.    Ascorbic Acid (VITAMIN C) 1000 MG tablet   aspirin EC 81 MG tablet   atorvastatin (LIPITOR) 40 MG tablet   Cholecalciferol 25 MCG (1000 UT) capsule   cyanocobalamin (VITAMIN B12) 1000 MCG tablet   furosemide (LASIX) 40 MG tablet   Iron, Ferrous Sulfate, 142 (45 Fe) MG TBCR   losartan (COZAAR) 25 MG tablet   metoprolol tartrate (LOPRESSOR) 25 MG tablet   potassium chloride (KLOR-CON M) 10 MEQ tablet   SitaGLIPtin-MetFORMIN HCl (JANUMET XR) 50-1000 MG TB24   History:   Past Medical History:  Diagnosis Date   Anemia    Anginal pain (HCC)    Aortic atherosclerosis (HCC)    Aortic stenosis    a.) s/p AVR on  05/05/2017   Bladder tumor    Cataracts, bilateral    CHF (congestive heart failure) (HCC)    CKD (chronic kidney disease), stage III (HCC)    Coronary artery disease 04/21/2017   a.) LHC 04/21/2017: 90% oRI, 80% o-pLCx, 70% o-pLAD, 60% pLAD, 40% pRCA, 40% m-dRCA, 75% oRPDA-RPDA. Severe AS (AVA = 1.0 cm2) --> CVTS consult; b.) s/p 4v CABG + AVR  05/05/2017   Dyspnea    Heart murmur    History of colon polyps    Hyperlipidemia    Hypertension    Muscle cramps    occasionally in legs    Osteoarthritis    Pulmonary hypertension (HCC)    a.) TTE 05/27/2015: PASP 36; b.) R/LHC 04/21/2017: mPA 34, mPCWP 25, AO sat 97, CO 6.82, CI 3.21; c.) TTE 12/28/2016: RVSP 37.3; d.) TTE 08/02/2019: RVSP 29.5   S/P AVR (aortic valve replacement) 05/05/2017   a.) 23mm [facility] Magna-Ease pericardial valve   S/P CABG x 4 05/05/2017   a.) LIMA-LAD, SVG-OM, SVG-RI, SVG-PDA   Senile purpura (HCC) 03/13/2018   Skin cancer of face    T2DM (type 2 diabetes mellitus) (HCC) 2009   Past Surgical History:  Procedure Laterality Date   AORTIC VALVE REPLACEMENT N/A 05/05/2017   Procedure: AORTIC VALVE REPLACEMENT (AVR) USING MAGNA EASE PERICARDIAL BIOPROSTHESIS - AORTIC VALVE MODEL 3300TFX, SIZE 23 MM;  Surgeon: Alleen Borne, MD;  Location: [facility] OR;  Service: Open Heart Surgery;  Laterality: N/A;   CATARACT EXTRACTION W/PHACO Right 07/21/2020   Procedure: CATARACT EXTRACTION PHACO AND INTRAOCULAR LENS PLACEMENT (IOC) RIGHT DIABETIC 9.09 00:51.3;  Surgeon: Nevada Crane, MD;  Location: [facility];  Service: Ophthalmology;  Laterality: Right;   CATARACT EXTRACTION W/PHACO Left 08/11/2020   Procedure: CATARACT EXTRACTION PHACO AND INTRAOCULAR LENS PLACEMENT (IOC) LEFT DIABETIC;  Surgeon: Nevada Crane, MD;  Location: [facility];  Service: Ophthalmology;  Laterality: Left;  5.08 0:37.2   COLONOSCOPY     CORONARY ARTERY BYPASS GRAFT N/A 05/05/2017   Procedure: CORONARY ARTERY BYPASS GRAFTING (CABG) x 4 WITH ENDOSCOPIC HARVESTING OF RIGHT SAPHENOUS VEIN (4v; LIMA-LAD, SVG-OM, SVG-RI, SVG-PDA);  Surgeon: Alleen Borne, MD;  Location: [facility] OR;  Service: Open Heart Surgery;  Laterality: N/A;   CYSTOURETHROSCOPY     JOINT REPLACEMENT Left    hip   KNEE ARTHROPLASTY Right 01/12/2016   Procedure: RIGHT  TOTAL KNEE ARTHROPLASTY WITH COMPUTER  NAVIGATION;  Surgeon: Samson Frederic, MD;  Location: [facility] OR;  Service: Orthopedics;  Laterality: Right;  Needs RNFA   RIGHT/LEFT HEART CATH AND CORONARY ANGIOGRAPHY N/A 04/21/2017   Procedure: RIGHT/LEFT HEART CATH AND CORONARY ANGIOGRAPHY;  Surgeon: Marcina Millard, MD;  Location: [facility] INVASIVE CV LAB;  Service: Cardiovascular;  Laterality: N/A;   SKIN SURGERY Left    arm   TEE WITHOUT CARDIOVERSION N/A 05/05/2017   Procedure: TRANSESOPHAGEAL ECHOCARDIOGRAM (TEE);  Surgeon: Alleen Borne, MD;  Location: [facility] OR;  Service: Open Heart Surgery;  Laterality: N/A;   TRANSURETHRAL RESECTION OF BLADDER TUMOR N/A 05/15/2019   Procedure: TRANSURETHRAL RESECTION OF BLADDER TUMOR (TURBT);  Surgeon: Riki Altes, MD;  Location: [facility] ORS;  Service: Urology;  Laterality: N/A;   TRANSURETHRAL RESECTION OF BLADDER TUMOR N/A 09/16/2020   Procedure: TRANSURETHRAL RESECTION OF BLADDER TUMOR (TURBT);  Surgeon: Riki Altes, MD;  Location: [facility] ORS;  Service: Urology;  Laterality: N/A;   TRANSURETHRAL RESECTION OF BLADDER TUMOR WITH MITOMYCIN-C N/A 03/14/2018   Procedure: TRANSURETHRAL RESECTION OF BLADDER TUMOR WITH Gemcitabine;  Surgeon: Riki Altes, MD;  Location: [facility] ORS;  Service: Urology;  Laterality: N/A;   Family History  Problem Relation Age of Onset   Dementia Mother    Stroke Mother    Stroke Father    Diabetes Father    Heart disease Father    Cancer Sister    Hypertension Brother    Cancer Sister    Social History   Tobacco Use   Smoking status: Former    Years: 25    Types: Cigarettes    Quit date: 12/15/1980    Years since quitting: 41.6   Smokeless tobacco: Never  Vaping Use   Vaping Use: Never used  Substance Use Topics   Alcohol use: No    Alcohol/week: 0.0 standard drinks of alcohol   Drug use: No    Pertinent Clinical Results:  LABS:   No visits with results within 3 Day(s) from this visit.  Latest known visit with results is:  Hospital Outpatient Visit  on 08/02/2022  Component Date Value Ref Range Status   WBC 08/02/2022 6.6  4.0 - 10.5 K/uL Final   RBC 08/02/2022 4.17 (L)  4.22 - 5.81 MIL/uL Final   Hemoglobin 08/02/2022 12.2 (L)  13.0 - 17.0 g/dL Final   HCT 16/11/9602 37.7 (L)  39.0 - 52.0 % Final   MCV 08/02/2022 90.4  80.0 - 100.0 fL Final   MCH 08/02/2022 29.3  26.0 - 34.0 pg Final   MCHC 08/02/2022 32.4  30.0 - 36.0 g/dL Final   RDW 54/10/8117 12.8  11.5 - 15.5 % Final   Platelets 08/02/2022 209  150 - 400 K/uL Final   nRBC 08/02/2022 0.0  0.0 - 0.2 % Final   Performed at [facility], [address] [place], [place] [address]   Sodium 08/02/2022 141  135 - 145 mmol/L Final   Potassium 08/02/2022 4.0  3.5 - 5.1 mmol/L Final   Chloride 08/02/2022 104  98 - 111 mmol/L Final   CO2 08/02/2022 29  22 - 32 mmol/L Final   Glucose, Bld 08/02/2022 108 (H)  70 - 99 mg/dL Final   Glucose reference range applies only to samples taken after fasting for at least 8 hours.   BUN 08/02/2022 20  8 - 23 mg/dL Final   Creatinine, Ser 08/02/2022 1.09  0.61 - 1.24 mg/dL Final   Calcium 95/62/1308 8.9  8.9 - 10.3 mg/dL Final   GFR, Estimated 08/02/2022 >60  >60 mL/min Final   Comment: (NOTE) Calculated using the CKD-EPI Creatinine Equation (2021)    Anion gap 08/02/2022 8  5 - 15 Final   Performed at [facility], [address] [place], [place] [address]    ECG: Date: 08/01/2022 Time ECG obtained: 1341 PM Rate: 57 bpm Rhythm: sinus bradycardia Axis (leads I and aVF): Normal Intervals: PR 204 ms. QRS 100 ms. QTc 432 ms. ST segment and T wave changes: No evidence of acute ST segment elevation or depression Comparison: Similar to previous tracing obtained on 09/12/2020   IMAGING / PROCEDURES: TRANSTHORACIC ECHOCARDIOGRAM performed on 08/02/2019 LVEF 45% Mild left ventricular systolic dysfunction with mild LVH Normal right ventricular systolic function Mild MR and TR No AR or PR No valvular stenosis  No evidence  of pericardial effusion   TRANSESOPHAGEAL ECHOCARDIOGRAM performed on03/08/2017 Left ventricular cavity size was normal. Wall thickness was normal. Systolic function was normal. The estimated ejection fraction was in the range of 55% to 60%. Wall motion was normal; there were no  regional wall motion abnormalities.  Aortic valve normal-sized, mildly calcified annulus. Trileaflet; moderately thickened leaflets. Cusp separation was reduced. Transvalvular velocity was increased. There was moderate to severe stenosis. There was trivial regurgitation. Mean gradient (S): 38 mm Hg. Peak gradient (S): 61 mm Hg. Valve area (VTI): 1 cm^2. Valve area (Vmax): 1.14 cm^2. Valve area (Vmean): 1.04 cm^2. There was mild regurgitation directed centrally.  Limited Post-CPB exam: Improved, vigorous LV function. overall EF approximately 70%. Prosthetic aortic valve  well seated in the aortic annulus. No AI [name] in LV outflow tract. Post replacement images demonstrate no residual valvular  insufficiency, with small perivalvular leak. Aortic valve peak gradient 11 mmHg, mean gradient 8 mmHg. No change post bypass in mitral valve function, with mild MR remaining.   CORONARY ARTERY BYPASS GRAFTING performed on 05/05/2017 Four-vessel CABG procedure LIMA-LAD SVG-OM SVG-RI SVG-PDA   RIGHT/LEFT HEART CATHETERIZATION AND CORONARY ANGIOGRAPHY performed on 04/21/2017 Three-vessel CAD 90% stenosis of the ostial ramus intermedius 80% stenosis of the ostial to proximal LCx 70% stenosis of the ostial to proximal LAD 60% stenosis of the proximal LAD 40% stenosis of the proximal RCA 40% stenosis of the mid to distal RCA 75% stenosis of the ostial RPDA to RPDA Unable to cross calcific aortic valve with a straight wire Severe aortic stenosis with calculated aortic valve area of 1.0 cm by 2D echocardiogram Mild pulmonary HTN with a mean pulmonary artery pressure of 34 mmHg Recommendations: CVTS consult for consideration of CABG  and AVR   Impression and Plan:  John [name] has been referred for pre-anesthesia review and clearance prior to him undergoing the planned anesthetic and procedural courses. Available labs, pertinent testing, and imaging results were personally reviewed by me in preparation for upcoming operative/procedural course. [facility] medical record has been updated following extensive record review and patient interview with PAT staff.   This patient has been appropriately cleared by cardiology with an overall LOW risk of experiencing significant perioperative cardiovascular complications. Based on clinical review performed today (08/06/22), barring any significant acute changes in the patient's overall condition, it is anticipated that he will be able to proceed with the planned surgical intervention. Any acute changes in clinical condition may necessitate his procedure being postponed and/or cancelled. Patient will meet with anesthesia team (MD and/or CRNA) on the day of his procedure for preoperative evaluation/assessment. Questions regarding anesthetic course will be fielded at that time.   Pre-surgical instructions were reviewed with the patient during his PAT appointment, and questions were fielded to satisfaction by PAT clinical staff. He has been instructed on which medications that he will need to hold prior to surgery, as well as the ones that have been deemed safe/appropriate to take on the day of his procedure. As part of the general education provided by PAT, patient made aware both verbally and in writing, that he would need to abstain from the use of any illegal substances during his perioperative course.  He was advised that failure to follow the provided instructions could necessitate case cancellation or result in serious perioperative complications up to and including death. Patient encouraged to contact PAT and/or his surgeon's office to discuss any questions or concerns that may arise prior  to surgery; verbalized understanding.   Quentin Mulling, MSN, APRN, FNP-C, CEN [facility]  Peri-operative Services Nurse Practitioner Phone: [phone] Fax: [phone] 08/06/22 2:51 PM  NOTE: This note has been prepared using Dragon dictation software. Despite my best ability to proofread, there is always  the potential that unintentional transcriptional errors may still occur from this process.

## 2022-08-10 ENCOUNTER — Ambulatory Visit
Admission: RE | Admit: 2022-08-10 | Discharge: 2022-08-10 | Disposition: A | Discharge status: Home / Self Care | Payer: PPO | Attending: Urology | Admitting: Urology

## 2022-08-10 ENCOUNTER — Encounter
Admission: RE | Disposition: A | Discharge status: Home / Self Care | Payer: Self-pay | Source: Home / Self Care | Attending: Urology

## 2022-08-10 ENCOUNTER — Encounter: Payer: Self-pay | Admitting: Urology

## 2022-08-10 ENCOUNTER — Ambulatory Visit: Payer: PPO | Admitting: Urgent Care

## 2022-08-10 ENCOUNTER — Other Ambulatory Visit: Payer: Self-pay

## 2022-08-10 DIAGNOSIS — N183 Chronic kidney disease, stage 3 unspecified: Secondary | ICD-10-CM | POA: Insufficient documentation

## 2022-08-10 DIAGNOSIS — I130 Hypertensive heart and chronic kidney disease with heart failure and stage 1 through stage 4 chronic kidney disease, or unspecified chronic kidney disease: Secondary | ICD-10-CM | POA: Diagnosis not present

## 2022-08-10 DIAGNOSIS — D494 Neoplasm of unspecified behavior of bladder: Secondary | ICD-10-CM | POA: Diagnosis not present

## 2022-08-10 DIAGNOSIS — E1122 Type 2 diabetes mellitus with diabetic chronic kidney disease: Secondary | ICD-10-CM | POA: Diagnosis not present

## 2022-08-10 DIAGNOSIS — I350 Nonrheumatic aortic (valve) stenosis: Secondary | ICD-10-CM | POA: Diagnosis not present

## 2022-08-10 DIAGNOSIS — Z87891 Personal history of nicotine dependence: Secondary | ICD-10-CM | POA: Diagnosis not present

## 2022-08-10 DIAGNOSIS — Z952 Presence of prosthetic heart valve: Secondary | ICD-10-CM | POA: Diagnosis not present

## 2022-08-10 DIAGNOSIS — I2510 Atherosclerotic heart disease of native coronary artery without angina pectoris: Secondary | ICD-10-CM | POA: Diagnosis not present

## 2022-08-10 DIAGNOSIS — C679 Malignant neoplasm of bladder, unspecified: Secondary | ICD-10-CM | POA: Diagnosis not present

## 2022-08-10 DIAGNOSIS — I509 Heart failure, unspecified: Secondary | ICD-10-CM | POA: Diagnosis not present

## 2022-08-10 DIAGNOSIS — N3289 Other specified disorders of bladder: Secondary | ICD-10-CM | POA: Diagnosis not present

## 2022-08-10 DIAGNOSIS — Z951 Presence of aortocoronary bypass graft: Secondary | ICD-10-CM | POA: Diagnosis not present

## 2022-08-10 DIAGNOSIS — C675 Malignant neoplasm of bladder neck: Secondary | ICD-10-CM

## 2022-08-10 DIAGNOSIS — D759 Disease of blood and blood-forming organs, unspecified: Secondary | ICD-10-CM | POA: Insufficient documentation

## 2022-08-10 DIAGNOSIS — D649 Anemia, unspecified: Secondary | ICD-10-CM | POA: Diagnosis not present

## 2022-08-10 DIAGNOSIS — Z01812 Encounter for preprocedural laboratory examination: Secondary | ICD-10-CM

## 2022-08-10 DIAGNOSIS — E1151 Type 2 diabetes mellitus with diabetic peripheral angiopathy without gangrene: Secondary | ICD-10-CM | POA: Diagnosis not present

## 2022-08-10 DIAGNOSIS — Z7689 Persons encountering health services in other specified circumstances: Secondary | ICD-10-CM | POA: Diagnosis not present

## 2022-08-10 DIAGNOSIS — E1129 Type 2 diabetes mellitus with other diabetic kidney complication: Secondary | ICD-10-CM

## 2022-08-10 HISTORY — DX: Atherosclerosis of aorta: I70.0

## 2022-08-10 HISTORY — PX: TRANSURETHRAL RESECTION OF BLADDER TUMOR: SHX2575

## 2022-08-10 HISTORY — PX: BLADDER INSTILLATION: SHX6893

## 2022-08-10 LAB — GLUCOSE, CAPILLARY
Glucose-Capillary: 144 mg/dL — ABNORMAL HIGH (ref 70–99)
Glucose-Capillary: 143 mg/dL — ABNORMAL HIGH (ref 70–99)

## 2022-08-10 SURGERY — TURBT (TRANSURETHRAL RESECTION OF BLADDER TUMOR)
Anesthesia: General

## 2022-08-10 MED ORDER — CHLORHEXIDINE GLUCONATE 0.12 % MT SOLN
OROMUCOSAL | Status: AC
  Filled 2022-08-10: qty 15

## 2022-08-10 MED ORDER — SODIUM CHLORIDE 0.9 % IR SOLN
Status: DC | PRN
  Administered 2022-08-10: 1 via INTRAVESICAL

## 2022-08-10 MED ORDER — FENTANYL CITRATE (PF) 100 MCG/2ML IJ SOLN
INTRAMUSCULAR | Status: DC | PRN
  Administered 2022-08-10: 25 ug via INTRAVENOUS

## 2022-08-10 MED ORDER — OXYCODONE HCL 5 MG/5ML PO SOLN: 5.0000 mg | Freq: Once | ORAL | Status: DC | PRN

## 2022-08-10 MED ORDER — ONDANSETRON HCL 4 MG/2ML IJ SOLN
INTRAMUSCULAR | Status: DC | PRN
  Administered 2022-08-10: 4 mg via INTRAVENOUS

## 2022-08-10 MED ORDER — LIDOCAINE HCL (CARDIAC) PF 100 MG/5ML IV SOSY
PREFILLED_SYRINGE | INTRAVENOUS | Status: DC | PRN
  Administered 2022-08-10: 40 mg via INTRAVENOUS

## 2022-08-10 MED ORDER — CEFAZOLIN SODIUM-DEXTROSE 2-4 GM/100ML-% IV SOLN
2.0000 g | INTRAVENOUS | Status: AC
  Administered 2022-08-10: 2 g via INTRAVENOUS

## 2022-08-10 MED ORDER — CEFAZOLIN SODIUM-DEXTROSE 2-4 GM/100ML-% IV SOLN
INTRAVENOUS | Status: AC
  Filled 2022-08-10: qty 100

## 2022-08-10 MED ORDER — ACETAMINOPHEN 10 MG/ML IV SOLN
INTRAVENOUS | Status: AC
  Filled 2022-08-10: qty 100

## 2022-08-10 MED ORDER — FENTANYL CITRATE (PF) 100 MCG/2ML IJ SOLN: 25.0000 ug | INTRAMUSCULAR | Status: DC | PRN

## 2022-08-10 MED ORDER — GEMCITABINE CHEMO FOR BLADDER INSTILLATION 2000 MG
INTRAVENOUS | Status: DC | PRN
  Administered 2022-08-10: 2000 mg via INTRAVESICAL

## 2022-08-10 MED ORDER — ROCURONIUM BROMIDE 100 MG/10ML IV SOLN
INTRAVENOUS | Status: DC | PRN
  Administered 2022-08-10: 60 mg via INTRAVENOUS

## 2022-08-10 MED ORDER — ORAL CARE MOUTH RINSE: 15.0000 mL | Freq: Once | OROMUCOSAL | Status: AC

## 2022-08-10 MED ORDER — SUGAMMADEX SODIUM 200 MG/2ML IV SOLN
INTRAVENOUS | Status: DC | PRN
  Administered 2022-08-10: 400 mg via INTRAVENOUS

## 2022-08-10 MED ORDER — GEMCITABINE CHEMO FOR BLADDER INSTILLATION 2000 MG
2000.0000 mg | Freq: Once | INTRAVENOUS | Status: DC
  Filled 2022-08-10: qty 52.6

## 2022-08-10 MED ORDER — PROPOFOL 10 MG/ML IV BOLUS
INTRAVENOUS | Status: DC | PRN
  Administered 2022-08-10: 120 mg via INTRAVENOUS

## 2022-08-10 MED ORDER — FAMOTIDINE 20 MG PO TABS
20.0000 mg | ORAL_TABLET | Freq: Once | ORAL | Status: AC
  Administered 2022-08-10: 20 mg via ORAL

## 2022-08-10 MED ORDER — FENTANYL CITRATE (PF) 100 MCG/2ML IJ SOLN
INTRAMUSCULAR | Status: AC
  Filled 2022-08-10: qty 2

## 2022-08-10 MED ORDER — EPHEDRINE SULFATE (PRESSORS) 50 MG/ML IJ SOLN
INTRAMUSCULAR | Status: DC | PRN
  Administered 2022-08-10 (×3): 5 mg via INTRAVENOUS

## 2022-08-10 MED ORDER — CHLORHEXIDINE GLUCONATE 0.12 % MT SOLN
15.0000 mL | Freq: Once | OROMUCOSAL | Status: AC
  Administered 2022-08-10: 15 mL via OROMUCOSAL

## 2022-08-10 MED ORDER — FAMOTIDINE 20 MG PO TABS
ORAL_TABLET | ORAL | Status: AC
  Filled 2022-08-10: qty 1

## 2022-08-10 MED ORDER — DEXAMETHASONE SODIUM PHOSPHATE 10 MG/ML IJ SOLN
INTRAMUSCULAR | Status: DC | PRN
  Administered 2022-08-10: 5 mg via INTRAVENOUS

## 2022-08-10 MED ORDER — SODIUM CHLORIDE 0.9 % IV SOLN: INTRAVENOUS | Status: DC

## 2022-08-10 MED ORDER — MIDAZOLAM HCL 2 MG/2ML IJ SOLN
INTRAMUSCULAR | Status: AC
  Filled 2022-08-10: qty 2

## 2022-08-10 MED ORDER — OXYCODONE HCL 5 MG PO TABS: 5.0000 mg | ORAL_TABLET | Freq: Once | ORAL | Status: DC | PRN

## 2022-08-10 SURGICAL SUPPLY — 25 items
BAG DRAIN SIEMENS DORNER NS (MISCELLANEOUS) ×1 IMPLANT
BAG DRN NS LF (MISCELLANEOUS) ×1
BAG DRN RND TRDRP ANRFLXCHMBR (UROLOGICAL SUPPLIES) ×1
BAG URINE DRAIN 2000ML AR STRL (UROLOGICAL SUPPLIES) ×1 IMPLANT
CATH FOL 2WAY LX 18X30 (CATHETERS) IMPLANT
CATH FOLEY 2WAY 18X30 (CATHETERS) IMPLANT
CATH FOLEY 2WAY SIL 18X30 (CATHETERS)
DRSG TELFA 3X4 N-ADH STERILE (GAUZE/BANDAGES/DRESSINGS) ×1 IMPLANT
ELECT LOOP 22F BIPOLAR SML (ELECTROSURGICAL)
ELECT REM PT RETURN 9FT ADLT (ELECTROSURGICAL)
ELECTRODE LOOP 22F BIPOLAR SML (ELECTROSURGICAL) IMPLANT
ELECTRODE REM PT RTRN 9FT ADLT (ELECTROSURGICAL) IMPLANT
GLOVE BIOGEL PI IND STRL 7.5 (GLOVE) ×1 IMPLANT
GOWN STRL REUS W/ TWL LRG LVL3 (GOWN DISPOSABLE) ×1 IMPLANT
GOWN STRL REUS W/TWL LRG LVL3 (GOWN DISPOSABLE) ×1
GOWN STRL REUS W/TWL XL LVL4 (GOWN DISPOSABLE) ×1 IMPLANT
IV NS IRRIG 3000ML ARTHROMATIC (IV SOLUTION) ×2 IMPLANT
KIT TURNOVER CYSTO (KITS) ×1 IMPLANT
LOOP CUT BIPOLAR 24F LRG (ELECTROSURGICAL) IMPLANT
PACK CYSTO AR (MISCELLANEOUS) ×1 IMPLANT
SET IRRIG Y TYPE TUR BLADDER L (SET/KITS/TRAYS/PACK) ×1 IMPLANT
SURGILUBE 2OZ TUBE FLIPTOP (MISCELLANEOUS) ×1 IMPLANT
SYR TOOMEY IRRIG 70ML (MISCELLANEOUS) ×1
SYRINGE TOOMEY IRRIG 70ML (MISCELLANEOUS) ×1 IMPLANT
WATER STERILE IRR 500ML POUR (IV SOLUTION) ×1 IMPLANT

## 2022-08-10 NOTE — H&P (Signed)
Urology H&P  Urologic history: 03/2013: distal right ureterectomy and reimplant for low grade UTUC 03/2014: TURBT, LGTa 04/2016: TURBT, PUNLMP 03/2018: TURBT, LGTa; post resection gemcitabine 04/2019: TURBT, LGTa; recommended 6-week course of intravesical chemotherapy however patient declined 09/16/2020: TURBT, LGTa; recommended 6-week course of intravesical chemotherapy however patient declined  History of Present Illness: John [name] is a 82 y.o. 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  He presents for TURBT  Past Medical History:  Diagnosis Date   Anemia    Anginal pain (HCC)    Aortic atherosclerosis (HCC)    Aortic stenosis    a.) s/p AVR on 05/05/2017   Bladder tumor    Cataracts, bilateral    CHF (congestive heart failure) (HCC)    CKD (chronic kidney disease), stage III (HCC)    Coronary artery disease 04/21/2017   a.) LHC 04/21/2017: 90% oRI, 80% o-pLCx, 70% o-pLAD, 60% pLAD, 40% pRCA, 40% m-dRCA, 75% oRPDA-RPDA. 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   a.) LIMA-LAD, SVG-OM, SVG-RI, SVG-PDA   Senile purpura (HCC) 03/13/2018   Skin cancer of face    T2DM (type 2 diabetes mellitus) (HCC) 2009    Past Surgical History:  Procedure Laterality Date   AORTIC VALVE REPLACEMENT N/A 05/05/2017   Procedure: AORTIC VALVE REPLACEMENT (AVR) USING MAGNA EASE PERICARDIAL BIOPROSTHESIS - AORTIC VALVE  MODEL 3300TFX, SIZE 23 MM;  Surgeon: Alleen Borne, MD;  Location: [facility] OR;  Service: Open Heart Surgery;  Laterality: N/A;   CATARACT EXTRACTION W/PHACO Right 07/21/2020   Procedure: CATARACT EXTRACTION PHACO AND INTRAOCULAR LENS PLACEMENT (IOC) RIGHT DIABETIC 9.09 00:51.3;  Surgeon: Nevada Crane, MD;  Location: [facility];  Service: Ophthalmology;  Laterality: Right;   CATARACT EXTRACTION W/PHACO Left 08/11/2020   Procedure: CATARACT EXTRACTION PHACO AND INTRAOCULAR LENS PLACEMENT (IOC) LEFT DIABETIC;  Surgeon: Nevada Crane, MD;  Location: [facility];  Service: Ophthalmology;  Laterality: Left;  5.08 0:37.2   COLONOSCOPY     CORONARY ARTERY BYPASS GRAFT N/A 05/05/2017   Procedure: CORONARY ARTERY BYPASS GRAFTING (CABG) x 4 WITH ENDOSCOPIC HARVESTING OF RIGHT SAPHENOUS VEIN (4v; LIMA-LAD, SVG-OM, SVG-RI, SVG-PDA);  Surgeon: Alleen Borne, MD;  Location: [facility] OR;  Service: Open Heart Surgery;  Laterality: N/A;   CYSTOURETHROSCOPY     JOINT REPLACEMENT Left    hip   KNEE ARTHROPLASTY Right 01/12/2016   Procedure: RIGHT  TOTAL KNEE ARTHROPLASTY WITH COMPUTER NAVIGATION;  Surgeon: Samson Frederic, MD;  Location: [facility] OR;  Service: Orthopedics;  Laterality: Right;  Needs RNFA   RIGHT/LEFT HEART CATH AND CORONARY ANGIOGRAPHY N/A 04/21/2017   Procedure: RIGHT/LEFT HEART CATH AND CORONARY ANGIOGRAPHY;  Surgeon: Marcina Millard, MD;  Location: [facility];  Service: Cardiovascular;  Laterality: N/A;   SKIN SURGERY Left  arm   TEE WITHOUT CARDIOVERSION N/A 05/05/2017   Procedure: TRANSESOPHAGEAL ECHOCARDIOGRAM (TEE);  Surgeon: Alleen Borne, MD;  Location: [facility] OR;  Service: Open Heart Surgery;  Laterality: N/A;   TRANSURETHRAL RESECTION OF BLADDER TUMOR N/A 05/15/2019   Procedure: TRANSURETHRAL RESECTION OF BLADDER TUMOR (TURBT);  Surgeon: Riki Altes, MD;  Location: [facility] ORS;  Service: Urology;  Laterality: N/A;   TRANSURETHRAL RESECTION OF BLADDER TUMOR  N/A 09/16/2020   Procedure: TRANSURETHRAL RESECTION OF BLADDER TUMOR (TURBT);  Surgeon: Riki Altes, MD;  Location: [facility] ORS;  Service: Urology;  Laterality: N/A;   TRANSURETHRAL RESECTION OF BLADDER TUMOR WITH MITOMYCIN-C N/A 03/14/2018   Procedure: TRANSURETHRAL RESECTION OF BLADDER TUMOR WITH Gemcitabine;  Surgeon: Riki Altes, MD;  Location: [facility] ORS;  Service: Urology;  Laterality: N/A;    Home Medications:  Current Meds  Medication Sig   atorvastatin (LIPITOR) 40 MG tablet TAKE 1 TABLET BY MOUTH ONCE DAILY AT 6PM   furosemide (LASIX) 40 MG tablet Take 0.5 tablets (20 mg total) by mouth daily.   losartan (COZAAR) 25 MG tablet TAKE ONE-HALF TABLET BY MOUTH ONCE DAILY   metoprolol tartrate (LOPRESSOR) 25 MG tablet Take 1 tablet (25 mg total) by mouth 2 (two) times daily.   potassium chloride (KLOR-CON M) 10 MEQ tablet Take 1 tablet (10 mEq total) by mouth daily.    Allergies: No Known Allergies  Family History  Problem Relation Age of Onset   Dementia Mother    Stroke Mother    Stroke Father    Diabetes Father    Heart disease Father    Cancer Sister    Hypertension Brother    Cancer Sister     Social History:  reports that he quit smoking about 41 years ago. His smoking use included cigarettes. He has never used smokeless tobacco. He reports that he does not drink alcohol and does not use drugs.  ROS: A complete review of systems was performed.  All systems are negative except for pertinent findings as noted.  Physical Exam:  Vital signs in last 24 hours: Temp:  [97.5 F (36.4 C)] 97.5 F (36.4 C) (06/11 0707) Pulse Rate:  [51-59] 59 (06/11 0759) Resp:  [18] 18 (06/11 0706) BP: (159-188)/(69-83) 159/69 (06/11 0759) SpO2:  [97 %] 97 % (06/11 0706) Weight:  [92.1 kg] 92.1 kg (06/11 0706) Constitutional:  Alert and oriented, No acute distress HEENT:  AT, moist mucus membranes.  Trachea midline, no masses Cardiovascular: Regular rate and rhythm Respiratory:  Normal respiratory effort, lungs clear bilaterally    Impression/Assessment:  Recurrent bladder tumor  Plan:  TURBT-the procedure has been discussed in detail as outlined in previous office note 07/01/2022.  All questions were answered and he desires to proceed.  Will plan post resection intravesical gemcitabine  08/10/2022, 8:05 AM  Irineo Axon,  MD

## 2022-08-10 NOTE — Interval H&P Note (Signed)
History and Physical Interval Note:  08/10/2022 8:08 AM  John [name]  has presented today for surgery, with the diagnosis of Bladder Tumor.  The various methods of treatment have been discussed with the patient and family. After consideration of risks, benefits and other options for treatment, the patient has consented to  Procedure(s): TRANSURETHRAL RESECTION OF BLADDER TUMOR (TURBT) (N/A) BLADDER INSTILLATION OF GEMCITABINE (N/A) as a surgical intervention.  The patient's history has been reviewed, patient examined, no change in status, stable for surgery.  I have reviewed the patient's chart and labs.  Questions were answered to the patient's satisfaction.     John [name]

## 2022-08-10 NOTE — Op Note (Signed)
   Preoperative diagnosis: Bladder tumor (1.5 cm)  Postoperative diagnosis:  Bladder tumor (1.5 cm)  Procedure:  Transurethral resection of bladder tumor (< 2 cm) Instillation intravesical gemcitabine   Surgeon: Lorin Picket C. Donnesha Karg, M.D.  Anesthesia: General  Complications: None  Intraoperative findings:  Bladder tumor: Mucosa with early papillary change anterior bladder neck Urethra normal in caliber without stricture.  Prostate with mild lateral lobe enlargement/bladder neck elevation Trabeculated bladder.  Patent reimplanted right UO.  Left UO normal in appearance   EBL: Minimal  Specimens: Bladder tumor      Indication: John [name] is a patient who was found to have a bladder tumor. After reviewing the management options for treatment, he elected to proceed with the above surgical procedure(s). We have discussed the potential benefits and risks of the procedure, side effects of the proposed treatment, the likelihood of the patient achieving the goals of the procedure, and any potential problems that might occur during the procedure or recuperation. Informed consent has been obtained.  Description of procedure:  The patient was taken to the operating room and general anesthesia was induced.  The patient was placed in the dorsal lithotomy position, prepped and draped in the usual sterile fashion, and preoperative antibiotics were administered. A preoperative time-out was performed.   A 24 French resectoscope sheath with obturator was lubricated and passed per urethra.  An Iglesias resectoscope with loop was then placed into the sheath.  Panendoscopy was performed with findings as described above.  The area of papillary change was resected in its entirety with inclusion of superficial muscle using bipolar current.  Hemostasis was obtained with cautery.  All specimen was removed via irrigation.  Resection site was reexamined and hemostasis was noted to be excellent.  The  bladder was then emptied and an 53 French Foley catheter was placed to gravity drainage.  2000 mg of intravesical gemcitabine was instilled to the bladder.  After anesthetic reversal he was transported to the PACU in stable condition.  Plan: Gemcitabine will be drained after 1 hour dwell time and catheter removed prior to discharge Will call with pathology results   [name] C. Lonna Cobb,  MD

## 2022-08-10 NOTE — Anesthesia Preprocedure Evaluation (Addendum)
Anesthesia Evaluation  Patient identified by MRN, date of birth, ID band Patient awake    Reviewed: Allergy & Precautions, NPO status , Patient's Chart, lab work & pertinent test results  History of Anesthesia Complications Negative for: history of anesthetic complications  Airway Mallampati: II  TM Distance: >3 FB Neck ROM: full    Dental  (+) Chipped, Poor Dentition   Pulmonary former smoker   Pulmonary exam normal        Cardiovascular Exercise Tolerance: Good hypertension, On Medications pulmonary hypertension+ angina with exertion + CAD, + CABG, + Peripheral Vascular Disease, +CHF and + DOE  Normal cardiovascular exam+ Valvular Problems/Murmurs (s/p AVR) AS   Last TTE performed on 08/02/2019 revealed a mildly reduced left ventricular systolic function with an EF of 55%.  There were no regional wall motion abnormalities.  Right ventricular size and function normal.  Mitral annular calcification observed.  Bioprosthetic aortic valve noted to be seated well and functioning properly; peak velocity 195.5 cm/s with a peak gradient of 15.3 mmHg.  There was mild mitral and tricuspid valve regurgitation.  All other transvalvular gradients were noted to be normal providing no evidence suggestive of valvular stenosis.  RVSP 29.5 mmHg  EKG 07/2022 Sinus bradycardia ST & T wave abnormality, consider lateral ischemia   Neuro/Psych negative neurological ROS  negative psych ROS   GI/Hepatic negative GI ROS, Neg liver ROS,,,  Endo/Other  diabetes, Well Controlled    Renal/GU Renal disease     Musculoskeletal  (+) Arthritis , Osteoarthritis,    Abdominal   Peds  Hematology  (+) Blood dyscrasia, anemia   Anesthesia Other Findings Past Medical History: No date: Anemia No date: Anginal pain (HCC) No date: Aortic atherosclerosis (HCC) No date: Aortic stenosis     Comment:  a.) s/p AVR on 05/05/2017 No date: Bladder tumor No  date: Cataracts, bilateral No date: CHF (congestive heart failure) (HCC) No date: CKD (chronic kidney disease), stage III (HCC) 04/21/2017: Coronary artery disease     Comment:  a.) LHC 04/21/2017: 90% oRI, 80% o-pLCx, 70% o-pLAD, 60%              pLAD, 40% pRCA, 40% m-dRCA, 75% oRPDA-RPDA. Severe AS               (AVA = 1.0 cm2) --> CVTS consult; b.) s/p 4v CABG + AVR               05/05/2017 No date: Dyspnea No date: Heart murmur No date: History of colon polyps No date: Hyperlipidemia No date: Hypertension No date: Muscle cramps     Comment:  occasionally in legs  No date: Osteoarthritis No date: Pulmonary hypertension (HCC)     Comment:  a.) TTE 05/27/2015: PASP 36; b.) R/LHC 04/21/2017: mPA               34, mPCWP 25, AO sat 97, CO 6.82, CI 3.21; c.) TTE               12/28/2016: RVSP 37.3; d.) TTE 08/02/2019: RVSP 29.5 05/05/2017: S/P AVR (aortic valve replacement)     Comment:  a.) 23mm Edwards Magna-Ease pericardial valve 05/05/2017: S/P CABG x 4     Comment:  a.) LIMA-LAD, SVG-OM, SVG-RI, SVG-PDA 03/13/2018: Senile purpura (HCC) No date: Skin cancer of face 2009: T2DM (type 2 diabetes mellitus) (HCC)  Past Surgical History: 05/05/2017: AORTIC VALVE REPLACEMENT; N/A     Comment:  Procedure: AORTIC VALVE REPLACEMENT (AVR) USING MAGNA  EASE PERICARDIAL BIOPROSTHESIS - AORTIC VALVE MODEL               3300TFX, SIZE 23 MM;  Surgeon: Alleen Borne, MD;                Location: [facility] OR;  Service: Open Heart Surgery;                Laterality: N/A; 07/21/2020: CATARACT EXTRACTION W/PHACO; Right     Comment:  Procedure: CATARACT EXTRACTION PHACO AND INTRAOCULAR               LENS PLACEMENT (IOC) RIGHT DIABETIC 9.09 00:51.3;                Surgeon: Nevada Crane, MD;  Location: [facility];  Service: Ophthalmology;  Laterality:               Right; 08/11/2020: CATARACT EXTRACTION W/PHACO; Left     Comment:  Procedure: CATARACT  EXTRACTION PHACO AND INTRAOCULAR               LENS PLACEMENT (IOC) LEFT DIABETIC;  Surgeon: Nevada Crane, MD;  Location: [facility];                Service: Ophthalmology;  Laterality: Left;  5.08 0:37.2 No date: COLONOSCOPY 05/05/2017: CORONARY ARTERY BYPASS GRAFT; N/A     Comment:  Procedure: CORONARY ARTERY BYPASS GRAFTING (CABG) x 4               WITH ENDOSCOPIC HARVESTING OF RIGHT SAPHENOUS VEIN (4v;               LIMA-LAD, SVG-OM, SVG-RI, SVG-PDA);  Surgeon: Alleen Borne, MD;  Location: [facility] OR;  Service: Open Heart               Surgery;  Laterality: N/A; No date: CYSTOURETHROSCOPY No date: JOINT REPLACEMENT; Left     Comment:  hip 01/12/2016: KNEE ARTHROPLASTY; Right     Comment:  Procedure: RIGHT  TOTAL KNEE ARTHROPLASTY WITH COMPUTER               NAVIGATION;  Surgeon: Samson Frederic, MD;  Location: [facility]               OR;  Service: Orthopedics;  Laterality: Right;  Needs               RNFA 04/21/2017: RIGHT/LEFT HEART CATH AND CORONARY ANGIOGRAPHY; N/A     Comment:  Procedure: RIGHT/LEFT HEART CATH AND CORONARY               ANGIOGRAPHY;  Surgeon: Marcina Millard, MD;                Location: [facility];  Service: Cardiovascular;              Laterality: N/A; No date: SKIN SURGERY; Left     Comment:  arm 05/05/2017: TEE WITHOUT CARDIOVERSION; N/A     Comment:  Procedure: TRANSESOPHAGEAL ECHOCARDIOGRAM (TEE);                Surgeon: Alleen Borne, MD;  Location: [facility] OR;  Service:  Open Heart Surgery;  Laterality: N/A; 05/15/2019: TRANSURETHRAL RESECTION OF BLADDER TUMOR; N/A     Comment:  Procedure: TRANSURETHRAL RESECTION OF BLADDER TUMOR               (TURBT);  Surgeon: Riki Altes, MD;  Location: [facility]               ORS;  Service: Urology;  Laterality: N/A; 09/16/2020: TRANSURETHRAL RESECTION OF BLADDER TUMOR; N/A     Comment:  Procedure: TRANSURETHRAL RESECTION OF BLADDER TUMOR                (TURBT);  Surgeon: Riki Altes, MD;  Location: [facility]               ORS;  Service: Urology;  Laterality: N/A; 03/14/2018: TRANSURETHRAL RESECTION OF BLADDER TUMOR WITH MITOMYCIN- C; N/A     Comment:  Procedure: TRANSURETHRAL RESECTION OF BLADDER TUMOR WITH              Gemcitabine;  Surgeon: Riki Altes, MD;  Location:               [facility] ORS;  Service: Urology;  Laterality: N/A;  BMI    Body Mass Index: 27.53 kg/m      Reproductive/Obstetrics negative OB ROS                             Anesthesia Physical Anesthesia Plan  ASA: 3  Anesthesia Plan: General ETT   Post-op Pain Management: Ofirmev IV (intra-op)*   Induction: Intravenous  PONV Risk Score and Plan: 2 and Ondansetron, Dexamethasone and Treatment may vary due to age or medical condition  Airway Management Planned: Oral ETT  Additional Equipment:   Intra-op Plan:   Post-operative Plan: Extubation in OR  Informed Consent: I have reviewed the patients History and Physical, chart, labs and discussed the procedure including the risks, benefits and alternatives for the proposed anesthesia with the patient or authorized representative who has indicated his/her understanding and acceptance.     Dental Advisory Given  Plan Discussed with: Anesthesiologist, CRNA and Surgeon  Anesthesia Plan Comments: (Patient consented for risks of anesthesia including but not limited to:  - adverse reactions to medications - damage to eyes, teeth, lips or other oral mucosa - nerve damage due to positioning  - sore throat or hoarseness - Damage to heart, brain, nerves, lungs, other parts of body or loss of life  Patient voiced understanding.)        Anesthesia Quick Evaluation

## 2022-08-10 NOTE — Transfer of Care (Signed)
Immediate Anesthesia Transfer of Care Note  Patient: John [name]  Procedure(s) Performed: TRANSURETHRAL RESECTION OF BLADDER TUMOR (TURBT) BLADDER INSTILLATION OF GEMCITABINE  Patient Location: PACU  Anesthesia Type:General  Level of Consciousness: awake and alert   Airway & Oxygen Therapy: Patient Spontanous Breathing and Patient connected to face mask oxygen  Post-op Assessment: Report given to RN and Post -op Vital signs reviewed and stable  Post vital signs: Reviewed  Last Vitals:  Vitals Value Taken Time  BP 155/72   Temp    Pulse 71 08/10/22 0915  Resp 23 08/10/22 0915  SpO2 100 % 08/10/22 0915  Vitals shown include unvalidated device data.  Last Pain:  Vitals:   08/10/22 0707  TempSrc: Oral  PainSc:          Complications: No notable events documented.

## 2022-08-10 NOTE — Anesthesia Postprocedure Evaluation (Signed)
Anesthesia Post Note  Patient: John [name]  Procedure(s) Performed: TRANSURETHRAL RESECTION OF BLADDER TUMOR (TURBT) BLADDER INSTILLATION OF GEMCITABINE  Patient location during evaluation: PACU Anesthesia Type: General Level of consciousness: awake and alert Pain management: pain level controlled Vital Signs Assessment: post-procedure vital signs reviewed and stable Respiratory status: spontaneous breathing, nonlabored ventilation, respiratory function stable and patient connected to nasal cannula oxygen Cardiovascular status: blood pressure returned to baseline and stable Postop Assessment: no apparent nausea or vomiting Anesthetic complications: no   No notable events documented.   Last Vitals:  Vitals:   08/10/22 0930 08/10/22 0945  BP: (!) 149/68 (!) 153/73  Pulse: 74 74  Resp: 12 20  Temp:  36.9 C  SpO2: 97% 95%    Last Pain:  Vitals:   08/10/22 0945  TempSrc:   PainSc: 0-No pain                 Louie Boston

## 2022-08-10 NOTE — Anesthesia Procedure Notes (Signed)
Procedure Name: Intubation Date/Time: 08/10/2022 8:35 AM  Performed by: Genia Del, CRNAPre-anesthesia Checklist: Patient identified, Patient being monitored, Timeout performed, Emergency Drugs available and Suction available Patient Re-evaluated:Patient Re-evaluated prior to induction Oxygen Delivery Method: Circle system utilized Preoxygenation: Pre-oxygenation with 100% oxygen Induction Type: IV induction Ventilation: Mask ventilation without difficulty Laryngoscope Size: McGraph and 4 Grade View: Grade I Tube type: Oral Tube size: 7.5 mm Number of attempts: 1 Airway Equipment and Method: Stylet Placement Confirmation: ETT inserted through vocal cords under direct vision, positive ETCO2 and breath sounds checked- equal and bilateral Secured at: 23 cm Tube secured with: Tape Dental Injury: Teeth and Oropharynx as per pre-operative assessment

## 2022-08-10 NOTE — Discharge Instructions (Addendum)
Transurethral Resection of Bladder Tumor (TURBT) or Bladder Biopsy   Definition:  Transurethral Resection of the Bladder Tumor is a surgical procedure used to diagnose and remove tumors within the bladder.   General instructions:     Your recent bladder surgery requires very little post hospital care but some definite precautions.  Despite the fact that no skin incisions were used, the area around the bladder incisions are raw and covered with scabs to promote healing and prevent bleeding. Certain precautions are needed to insure that the scabs are not disturbed over the next 2-4 weeks while the healing proceeds.  Because the raw surface inside your bladder and the irritating effects of urine you may expect frequency of urination and/or urgency (a stronger desire to urinate) and perhaps even getting up at night more often. This will usually resolve or improve slowly over the healing period. You may see some blood in your urine over the first 6 weeks. Do not be alarmed, even if the urine was clear for a while. Get off your feet and drink lots of fluids until clearing occurs. If you start to pass clots or don't improve call us.  Diet:  You may return to your normal diet immediately. Because of the raw surface of your bladder, alcohol, spicy foods, foods high in acid and drinks with caffeine may cause irritation or frequency and should be used in moderation. To keep your urine flowing freely and avoid constipation, drink plenty of fluids during the day (8-10 glasses). Tip: Avoid cranberry juice because it is very acidic.  Activity:  Your physical activity doesn't need to be restricted. However, if you are very active, you may see some blood in the urine. We suggest that you reduce your activity under the circumstances until the bleeding has stopped.  Bowels:  It is important to keep your bowels regular during the postoperative period. Straining with bowel movements can cause bleeding. A bowel  movement every other day is reasonable. Use a mild laxative if needed, such as milk of magnesia 2-3 tablespoons, or 2 Dulcolax tablets. Call if you continue to have problems. If you had been taking narcotics for pain, before, during or after your surgery, you may be constipated. Take a laxative if necessary.    Medication:  You should resume your pre-surgery medications unless told not to. In addition you may be given an antibiotic to prevent or treat infection. Antibiotics are not always necessary. All medication should be taken as prescribed until the bottles are finished unless you are having an unusual reaction to one of the drugs.   [facility] [address], [address] [place], [place] [address] [phone]    AMBULATORY SURGERY  DISCHARGE INSTRUCTIONS   The drugs that you were given will stay in your system until tomorrow so for the next 24 hours you should not:  Drive an automobile Make any legal decisions Drink any alcoholic beverage   You may resume regular meals tomorrow.  Today it is better to start with liquids and gradually work up to solid foods.  You may eat anything you prefer, but it is better to start with liquids, then soup and crackers, and gradually work up to solid foods.   Please notify your doctor immediately if you have any unusual bleeding, trouble breathing, redness and pain at the surgery site, drainage, fever, or pain not relieved by medication.     Additional Instructions:

## 2022-08-11 ENCOUNTER — Encounter: Payer: Self-pay | Admitting: Urology

## 2022-08-12 ENCOUNTER — Telehealth: Payer: Self-pay

## 2022-08-12 NOTE — Telephone Encounter (Signed)
Incoming call from path lab stating that Dr. Illene Regulus needs to speak with Dr. Lonna Cobb to clarify specimen and get additional information, please return call at [phone].

## 2022-08-18 ENCOUNTER — Encounter: Payer: Self-pay | Admitting: Urology

## 2022-08-18 ENCOUNTER — Ambulatory Visit (INDEPENDENT_AMBULATORY_CARE_PROVIDER_SITE_OTHER): Payer: PPO | Admitting: Urology

## 2022-08-18 VITALS — BP 135/67 | HR 73 | Ht 72.0 in | Wt 203.0 lb

## 2022-08-18 DIAGNOSIS — C679 Malignant neoplasm of bladder, unspecified: Secondary | ICD-10-CM

## 2022-08-18 LAB — MICROSCOPIC EXAMINATION: RBC, Urine: >30 /hpf — AB (ref 0–2)

## 2022-08-18 LAB — URINALYSIS, COMPLETE
Bilirubin, UA: NEGATIVE
Glucose, UA: NEGATIVE
Ketones, UA: NEGATIVE
Nitrite, UA: NEGATIVE
Protein,UA: NEGATIVE
Specific Gravity, UA: 1.015 (ref 1.005–1.030)
Urobilinogen, Ur: 0.2 mg/dL (ref 0.2–1.0)
pH, UA: 6 (ref 5.0–7.5)

## 2022-08-18 NOTE — Progress Notes (Signed)
I,Dina M Abdulla,acting as a scribe for Riki Altes, MD.,have documented all relevant documentation on the behalf of Riki Altes, MD,as directed by  Riki Altes, MD while in the presence of Riki Altes, MD.   08/18/2022 1:38 PM   John [name] 1940/11/29 [id number]  Referring provider: Marjie Skiff, NP [address] [place],  [place] [address]  Chief Complaint  Patient presents with   Results    Urologic history: 03/2013: distal right ureterectomy and reimplant for low grade UTUC 03/2014: TURBT, LGTa 04/2016: TURBT, PUNLMP 03/2018: TURBT, LGTa; post resection gemcitabine  HPI: 82 yo male presents for postop follow-up.  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 He underwent TURBT of 1.5 cm papillary tumor. Anterior bladder neck 08/10/22. Was having fairly significant dysuria, frequency and urgency last week. This has improved, but his dysura persists. Pathology: tumor was non-invasive, low-grade urothelial carcinoma of the bladder.    PMH: Past Medical History:  Diagnosis Date   Anemia    Anginal pain (HCC)    Aortic atherosclerosis (HCC)    Aortic stenosis    a.) s/p AVR on 05/05/2017   Bladder tumor    Cataracts, bilateral    CHF (congestive heart failure) (HCC)    CKD (chronic kidney disease), stage III (HCC)    Coronary artery disease 04/21/2017   a.) LHC 04/21/2017: 90% oRI, 80% o-pLCx, 70% o-pLAD, 60% pLAD, 40% pRCA, 40% m-dRCA, 75% oRPDA-RPDA. 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/05/2017   a.) LIMA-LAD, SVG-OM, SVG-RI, SVG-PDA   Senile purpura (HCC) 03/13/2018   Skin cancer of face    T2DM (type 2 diabetes mellitus) (HCC) 2009    Surgical History: Past Surgical History:  Procedure Laterality Date   AORTIC VALVE REPLACEMENT N/A 05/05/2017   Procedure: AORTIC VALVE REPLACEMENT (AVR) USING MAGNA EASE PERICARDIAL BIOPROSTHESIS - AORTIC VALVE MODEL 3300TFX, SIZE 23 MM;  Surgeon: Alleen Borne, MD;  Location: [facility] OR;  Service: Open Heart Surgery;  Laterality: N/A;   BLADDER INSTILLATION N/A 08/10/2022   Procedure: BLADDER INSTILLATION OF GEMCITABINE;  Surgeon: Riki Altes, MD;  Location: [facility] ORS;  Service: Urology;  Laterality: N/A;   CATARACT EXTRACTION W/PHACO Right 07/21/2020   Procedure: CATARACT EXTRACTION PHACO AND INTRAOCULAR LENS PLACEMENT (IOC) RIGHT DIABETIC 9.09 00:51.3;  Surgeon: Nevada Crane, MD;  Location: [facility];  Service: Ophthalmology;  Laterality: Right;   CATARACT EXTRACTION W/PHACO Left 08/11/2020   Procedure: CATARACT EXTRACTION PHACO AND INTRAOCULAR LENS PLACEMENT (IOC) LEFT DIABETIC;  Surgeon: Nevada Crane, MD;  Location: [facility];  Service: Ophthalmology;  Laterality: Left;  5.08 0:37.2   COLONOSCOPY     CORONARY ARTERY BYPASS GRAFT N/A 05/05/2017   Procedure: CORONARY ARTERY BYPASS GRAFTING (CABG) x 4 WITH ENDOSCOPIC HARVESTING OF RIGHT SAPHENOUS VEIN (4v; LIMA-LAD, SVG-OM, SVG-RI, SVG-PDA);  Surgeon: Alleen Borne, MD;  Location: [facility] OR;  Service: Open Heart Surgery;  Laterality: N/A;   CYSTOURETHROSCOPY     JOINT REPLACEMENT Left    hip   KNEE ARTHROPLASTY Right 01/12/2016   Procedure: RIGHT  TOTAL KNEE ARTHROPLASTY WITH COMPUTER NAVIGATION;  Surgeon: Samson Frederic, MD;  Location: [facility] OR;  Service: Orthopedics;  Laterality: Right;  Needs RNFA   RIGHT/LEFT HEART CATH AND CORONARY ANGIOGRAPHY N/A 04/21/2017   Procedure: RIGHT/LEFT HEART CATH AND  CORONARY ANGIOGRAPHY;  Surgeon: Marcina Millard, MD;  Location: [facility] IN[facility]AS[facility];  Service: Cardiovascular;  Laterality: N/A;   SKIN SURGERY Left    arm   TEE WITHOUT CARDIOVERSION N/A 05/05/2017   Procedure: TRANSESOPHAGEAL ECHOCARDIOGRAM (TEE);  Surgeon: Alleen Borne, MD;  Location: [facility] OR;  Service: Open Heart Surgery;  Laterality: N/A;   TRANSURETHRAL RESECTION OF BLADDER TUMOR N/A 05/15/2019   Procedure: TRANSURETHRAL RESECTION OF BLADDER TUMOR (TURBT);  Surgeon: Riki Altes, MD;  Location: [facility] ORS;  Service: Urology;  Laterality: N/A;   TRANSURETHRAL RESECTION OF BLADDER TUMOR N/A 09/16/2020   Procedure: TRANSURETHRAL RESECTION OF BLADDER TUMOR (TURBT);  Surgeon: Riki Altes, MD;  Location: [facility] ORS;  Service: Urology;  Laterality: N/A;   TRANSURETHRAL RESECTION OF BLADDER TUMOR N/A 08/10/2022   Procedure: TRANSURETHRAL RESECTION OF BLADDER TUMOR (TURBT);  Surgeon: Riki Altes, MD;  Location: [facility] OR[facility];  Service: Urology;  Laterality: N/A;   TRANSURETHRAL RESECTION OF BLADDER TUMOR WITH MITOMYCIN-C N/A 03/14/2018   Procedure: TRANSURETHRAL RESECTION OF BLADDER TUMOR WITH Gemcitabine;  Surgeon: Riki Altes, MD;  Location: [facility] ORS;  Service: Urology;  Laterality: N/A;    Home Medications:  Allergies as of 08/18/2022   No Known Allergies      Medication List        Accurate as of August 18, 2022  1:38 PM. If you have any questions, ask your nurse or doctor.          aspirin EC 81 MG tablet Take 81 mg by mouth daily. Swallow whole.   atorvastatin 40 MG tablet Commonly known as: LIPITOR TAKE 1 TABLET BY MOUTH ONCE DAILY AT 6PM   Cholecalciferol 25 MCG (1000 UT) capsule Take 1,000 Units by mouth daily.   cyanocobalamin 1000 MCG tablet Commonly known as: VITAMIN B12 Take 1,000 mcg by mouth daily.   furosemide 40 MG tablet Commonly known as: LASIX Take 0.5 tablets (20 mg total) by mouth daily.   Iron (Ferrous Sulfate) 142 (45 Fe) MG Tbcr Take  45 mg by mouth daily.   Janumet XR 50-1000 MG Tb24 Generic drug: SitaGLIPtin-MetFORMIN HCl Take 1 tablet by mouth 2 (two) times daily.   losartan 25 MG tablet Commonly known as: COZAAR TAKE ONE-HALF TABLET BY MOUTH ONCE DAILY   metoprolol tartrate 25 MG tablet Commonly known as: LOPRESSOR Take 1 tablet (25 mg total) by mouth 2 (two) times daily.   potassium chloride 10 MEQ tablet Commonly known as: KLOR-CON M Take 1 tablet (10 mEq total) by mouth daily.   vitamin C 1000 MG tablet Take 1,000 mg by mouth daily.        Family History: Family History  Problem Relation Age of Onset   Dementia Mother    Stroke Mother    Stroke Father    Diabetes Father    Heart disease Father    Cancer Sister    Hypertension Brother    Cancer Sister     Social History:  reports that he quit  smoking about 41 years ago. His smoking use included cigarettes. He has never used smokeless tobacco. He reports that he does not drink alcohol and does not use drugs.   Physical Exam: BP 135/67   Pulse 73   Ht 6' (1.829 m)   Wt 203 lb (92.1 kg)   BMI 27.53 kg/m   Constitutional:  Alert and oriented, No acute distress.   Assessment & Plan:    1. 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 He has previously refused courses of intravascular chemotherapy. Persistent dysuria and urinalysis/urine culture ordered.    [facility] [address], [address] [place], [place] [address] [phone]

## 2022-08-20 LAB — CULTURE, URINE COMPREHENSIVE

## 2022-10-23 NOTE — Patient Instructions (Signed)
 Be Involved in Caring For Your Health:  Taking Medications When medications are taken as directed, they can greatly improve your health. But if they are not taken as prescribed, they may not work. In some cases, not taking them correctly can be harmful. To help ensure your treatment remains effective and safe, understand your medications and how to take them. Bring your medications to each visit for review by your provider.  Your lab results, notes, and after visit summary will be available on My Chart. We strongly encourage you to use this feature. If lab results are abnormal the clinic will contact you with the appropriate steps. If the clinic does not contact you assume the results are satisfactory. You can always view your results on My Chart. If you have questions regarding your health or results, please contact the clinic during office hours. You can also ask questions on My Chart.  We at [facility] are grateful that you chose [place] to provide your care. We strive to provide evidence-based and compassionate care and are always looking for feedback. If you get a survey from the clinic please complete this so we can hear your opinions.  Diabetes Mellitus and Skin Care Diabetes, also called diabetes mellitus, can lead to skin problems. If blood sugar (glucose) is not well controlled, it can cause problems over time. These problems include: Damage to nerves. This can affect your ability to feel wounds. This means you may not notice small skin injuries that could lead to bigger problems. This can also decrease the amount that you sweat, causing dry skin. Damage to blood vessels. The lack of blood flow can cause skin to break down. It can also slow healing time, which can lead to infections. Areas of skin that become thick or discolored. Common skin conditions There are certain skin conditions that often affect people with diabetes. These include: Dry skin. Thin skin. The skin on the feet  may get thinner, break more easily, and heal more slowly than normal. Skin infections from bacteria. These include: Styes. These are infections near the eyelid. Boils. These are bumps filled with pus. Infected hair follicles. Infections of the skin around the nails. Fungal skin infections. These are most common in areas where skin rubs together, such as in the armpits or under the breasts. Common skin changes Diabetes can also cause the skin to change. You may develop: Dark, velvety markings on your skin. These may appear on your face, neck, armpits, inner thighs, and groin. Red, raised, scar-like tissue that may itch, feel painful, or become a wound. Blisters on your feet, toes, hands, or fingers. Thick, wax-like areas of skin. In most cases, these occur on the hands, forehead, or toes. Brown or red, ring-shaped or half-ring-shaped patches of skin on the ears or fingers. Pea-shaped, yellow bumps that may be itchy and have a red ring around them. This may affect your arms, feet, buttocks, and the top of your hands. Round, discolored patches of tan skin that do not hurt or itch. These may look like age spots. Supplies needed: Mild soap or gentle skin cleanser. Lotion. How to care for dry, itchy skin Frequent high glucose levels can cause skin to become itchy. Poor blood circulation and skin infections can make dry skin worse. If you have dry, itchy skin: Avoid very hot showers and baths. Use mild soap and gentle skin cleansers. Do not use soap that is perfumed, harsh, or that dries your skin. Moisturizing soaps may help. Put on moisturizing  lotion as soon as you finish bathing. Do not scratch dry skin. Scratching can expose skin to infection. If you have a rash or if your skin is very itchy, contact your health care provider. Skin that is red or covered in a rash may be a sign of an allergic reaction. Very itchy skin may mean that you need help to manage your diabetes better. You may also  need treatment for an infection. General tips Most skin problems can be prevented or treated easily if caught early. Talk with your health care provider if you have any concerns. General tips include: Check your skin every day for cuts, bruises, redness, blisters, or sores, especially on your feet. If you cannot see the bottom of your feet, use a mirror or ask someone for help. Tell your health care provider if you have any of these injuries and if they are healing slowly. Keep your skin clean and dry. Do not use hot water. Moisturize your skin to prevent chapping. Keep your blood glucose levels within target range. Follow these instructions at home:  Take over-the-counter and prescription medicines only as told by your health care provider. This includes all diabetes medicines you are taking. Schedule a foot exam with your health care provider once a year. During the exam, the structure and skin of your feet will be checked for problems. Make sure that your health care provider does a visual foot exam at every visit. If you get a skin injury, such as a cut, blister, or sore, check the area every day for signs of infection. Check for: Redness, swelling, or pain. Fluid or blood. Warmth. Pus or a bad smell. Do not use any products that contain nicotine or tobacco. These products include cigarettes, chewing tobacco, and vaping devices, such as e-cigarettes. If you need help quitting, ask your health care provider. Where to find more information American Diabetes Association: diabetes.org Association of Diabetes Care & Education Specialists: diabeteseducator.org Contact a health care provider if: You get a cut or sore, especially on your feet. You have signs of infection after a skin injury. You have itchy skin that turns red or develops a rash. You have discolored areas of skin. You have places on your skin that change. They may thicken or appear shiny. This information is not intended to  replace advice given to you by your health care provider. Make sure you discuss any questions you have with your health care provider. Document Revised: 08/19/2021 Document Reviewed: 08/19/2021 Elsevier Patient Education  2024 [employer].

## 2022-10-25 ENCOUNTER — Encounter: Payer: Self-pay | Admitting: Nurse Practitioner

## 2022-10-25 ENCOUNTER — Ambulatory Visit: Payer: PPO | Admitting: Nurse Practitioner

## 2022-10-25 VITALS — BP 134/68 | HR 49 | Ht 72.0 in | Wt 203.6 lb

## 2022-10-25 DIAGNOSIS — R7989 Other specified abnormal findings of blood chemistry: Secondary | ICD-10-CM

## 2022-10-25 DIAGNOSIS — E785 Hyperlipidemia, unspecified: Secondary | ICD-10-CM

## 2022-10-25 DIAGNOSIS — M533 Sacrococcygeal disorders, not elsewhere classified: Secondary | ICD-10-CM

## 2022-10-25 DIAGNOSIS — D692 Other nonthrombocytopenic purpura: Secondary | ICD-10-CM | POA: Diagnosis not present

## 2022-10-25 DIAGNOSIS — I5022 Chronic systolic (congestive) heart failure: Secondary | ICD-10-CM

## 2022-10-25 DIAGNOSIS — C679 Malignant neoplasm of bladder, unspecified: Secondary | ICD-10-CM | POA: Diagnosis not present

## 2022-10-25 DIAGNOSIS — E1159 Type 2 diabetes mellitus with other circulatory complications: Secondary | ICD-10-CM

## 2022-10-25 DIAGNOSIS — D508 Other iron deficiency anemias: Secondary | ICD-10-CM

## 2022-10-25 DIAGNOSIS — I152 Hypertension secondary to endocrine disorders: Secondary | ICD-10-CM | POA: Diagnosis not present

## 2022-10-25 DIAGNOSIS — E1129 Type 2 diabetes mellitus with other diabetic kidney complication: Secondary | ICD-10-CM | POA: Diagnosis not present

## 2022-10-25 DIAGNOSIS — R809 Proteinuria, unspecified: Secondary | ICD-10-CM

## 2022-10-25 DIAGNOSIS — E1169 Type 2 diabetes mellitus with other specified complication: Secondary | ICD-10-CM | POA: Diagnosis not present

## 2022-10-25 LAB — BAYER DCA HB A1C WAIVED: HB A1C (BAYER DCA - WAIVED): 6.8 % — ABNORMAL HIGH (ref 4.8–5.6)

## 2022-10-25 NOTE — Assessment & Plan Note (Signed)
Chronic, ongoing.  Euvolemic today.  Continue collaboration with cardiology and current medication regimen.  Recommend: - Reminded to call for an overnight weight gain of >2 pounds or a weekly weight weight of >5 pounds - not adding salt to his food and has been reading food labels. Reviewed the importance of keeping daily sodium intake to '2000mg'$  daily  - Avoid NSAIDs - Would benefit repeat echo if symptoms worsen

## 2022-10-25 NOTE — Progress Notes (Signed)
BP 134/68 (BP Location: Left Arm, Patient Position: Sitting)   Pulse (!) 49   Ht 6' (1.829 m)   Wt 203 lb 9.6 oz (92.4 kg)   SpO2 97%   BMI 27.61 kg/m    Subjective:    Patient ID: John [name], male    DOB: 06/24/1940, 82 y.o.   MRN: [id number]  HPI: John [name] is a 82 y.o. male  Chief Complaint  Patient presents with   Diabetes   Hypertension   Hyperlipidemia   Congestive Heart Failure   Difficulty Walking    Patient says he is having trouble with walking and it has been getting worse lately. Patient says he feels as if his get up and good has left and been good.    DIABETES Continues Janumet XR 50-1000 MG 1 tablet BID.  A1c February 6.6%. Hypoglycemic episodes:no Polydipsia/polyuria: no Visual disturbance: no Chest pain: no Paresthesias: no Glucose Monitoring: no             Accucheck frequency: Not Checking             Fasting glucose:             Post prandial:             Evening:             Before meals: Taking Insulin?: no             Long acting insulin:             Short acting insulin: Blood Pressure Monitoring: rarely Retinal Examination: Not Up To Date -- [place] Foot Exam: Up to Date Pneumovax: Up to Date Influenza: Up to Date Aspirin: yes   HYPERTENSION / HYPERLIPIDEMIA/HF Continues Lasix 20 MG daily, Metoprolol 25 MG BID + Losartan 12.5 MG + Lipitor 40 MG, ASA.  Has moderate underlying aortic stenosis noted on past imaging.  Visits with cardiology, last 05/17/22 with no changes. History of elevation of TSH on labs.  No symptoms and recent levels stable.  Last echo 08/02/19 -- mild LV dysfunction with LVH, EF 45%.  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 Satisfied with current treatment? yes Duration of hypertension: chronic BP monitoring frequency: not checking BP range:  BP medication side effects: no Duration of hyperlipidemia: chronic Cholesterol medication side effects: no Cholesterol supplements: none Medication  compliance: good compliance Aspirin: yes Recent stressors: no Recurrent headaches: no Visual changes: no Palpitations: no Dyspnea: no and denies orthopnea Chest pain: no Lower extremity edema: yes, at baseline  Dizzy/lightheaded: no  ANEMIA Continues iron and B12 at home.  For awhile he reports he has been having difficulty walking.  No falls recently or injuries.  Has cane and reports he should start using it.  Getting down, it can be hard to get up.  Has an orthopedic provider, but they are in [place] -- too far.  Is having pain to tailbone, present for months.  Makes it difficult to walk.  Notices it if sits for too long and goes to church on seats has discomfort. Anemia status: stable Etiology of anemia: ?surgery Duration of anemia treatment: months Compliance with treatment: good compliance Iron supplementation side effects: no Severity of anemia: mild Fatigue: no Decreased exercise tolerance: no  Dyspnea on exertion: no Palpitations: no Bleeding: no Pica: no   BLADDER CANCER: Followed by urology, last visit with Dr. Lonna Cobb on 08/10/22 for cystoscopy -- he reports they injected chemo and he was not aware  they were doing to do this. Reports that each time he has these procedures it takes longer to recover (2-3 weeks to recover), so he is pondering whether to continue doing these.  He does not want chemo.  Since the recent procedure he has been feeling off and not well.       10/25/2022    8:52 AM 04/26/2022    8:40 AM 12/03/2021   12:41 PM 12/01/2020    1:07 PM 03/12/2020    8:24 AM  Depression screen PHQ 2/9  Decreased Interest 0 0 0 0 0  Down, Depressed, Hopeless 0 0 0 0 0  PHQ - 2 Score 0 0 0 0 0  Altered sleeping 0 3 2  0  Tired, decreased energy 0 0 0  0  Change in appetite 0 0 0  0  Feeling bad or failure about yourself  0 0 0  0  Trouble concentrating 0 0 0  0  Moving slowly or fidgety/restless 0 0 0  0  Suicidal thoughts 0 0 0  0  PHQ-9 Score 0 3 2  0   Difficult doing work/chores Not difficult at all Not difficult at all Not difficult at all         10/25/2022    8:52 AM 04/26/2022    8:40 AM  GAD 7 : Generalized Anxiety Score  Nervous, Anxious, on Edge 0 0  Control/stop worrying 0 0  Worry too much - different things 0 0  Trouble relaxing 0 0  Restless 0 0  Easily annoyed or irritable 0 0  Afraid - awful might happen 0 0  Total GAD 7 Score 0 0  Anxiety Difficulty Not difficult at all Not difficult at all   Relevant past medical, surgical, family and social history reviewed and updated as indicated. Interim medical history since our last visit reviewed. Allergies and medications reviewed and updated.  Review of Systems  Constitutional:  Negative for activity change, diaphoresis, fatigue and fever.  Respiratory:  Negative for cough, chest tightness, shortness of breath and wheezing.   Cardiovascular:  Negative for chest pain, palpitations and leg swelling.  Gastrointestinal: Negative.   Endocrine: Negative for cold intolerance, heat intolerance, polydipsia, polyphagia and polyuria.  Musculoskeletal:  Positive for arthralgias.  Neurological:  Positive for weakness.  Psychiatric/Behavioral: Negative.     Per HPI unless specifically indicated above     Objective:    BP 134/68 (BP Location: Left Arm, Patient Position: Sitting)   Pulse (!) 49   Ht 6' (1.829 m)   Wt 203 lb 9.6 oz (92.4 kg)   SpO2 97%   BMI 27.61 kg/m   Wt Readings from Last 3 Encounters:  10/25/22 203 lb 9.6 oz (92.4 kg)  08/18/22 203 lb (92.1 kg)  08/10/22 203 lb (92.1 kg)    Physical Exam Vitals and nursing note reviewed.  Constitutional:      General: He is awake. He is not in acute distress.    Appearance: He is well-developed and overweight. He is not ill-appearing.  HENT:     Head: Normocephalic and atraumatic.     Right Ear: Hearing normal. No drainage.     Left Ear: Hearing normal. No drainage.  Eyes:     General: Lids are normal.         Right eye: No discharge.        Left eye: No discharge.     Conjunctiva/sclera: Conjunctivae normal.     Pupils: Pupils are equal,  round, and reactive to light.  Neck:     Thyroid: No thyromegaly.     Vascular: No carotid bruit.  Cardiovascular:     Rate and Rhythm: Normal rate and regular rhythm.     Heart sounds: S1 normal and S2 normal. Murmur heard.     Systolic murmur is present with a grade of 2/6.     No gallop.  Pulmonary:     Effort: Pulmonary effort is normal. No accessory muscle usage or respiratory distress.     Breath sounds: Normal breath sounds.  Abdominal:     General: Bowel sounds are normal.     Palpations: Abdomen is soft.  Musculoskeletal:        General: Normal range of motion.     Cervical back: Normal range of motion and neck supple.     Right lower leg: 1+ Edema present.     Left lower leg: 1+ Edema present.     Comments: Mild tenderness to coccyx area with no wounds or redness noted.  Lymphadenopathy:     Cervical: No cervical adenopathy.  Skin:    General: Skin is warm and dry.     Capillary Refill: Capillary refill takes less than 2 seconds.     Comments: Scattered pale purple bruises noted to bilateral upper extremities, skin intact.  Neurological:     Mental Status: He is alert and oriented to person, place, and time.     Deep Tendon Reflexes: Reflexes are normal and symmetric.  Psychiatric:        Attention and Perception: Attention normal.        Mood and Affect: Mood normal.        Speech: Speech normal.        Behavior: Behavior normal. Behavior is cooperative.        Thought Content: Thought content normal.    Results for orders placed or performed in visit on 08/18/22  CULTURE, URINE COMPREHENSIVE   Specimen: Urine   UR  Result Value Ref Range   Urine Culture, Comprehensive Final report    Organism ID, Bacteria Comment   Microscopic Examination   Urine  Result Value Ref Range   WBC, UA 6-10 (A) 0 - 5 /hpf   RBC, Urine >30 (A) 0 -  2 /hpf   Epithelial Cells (non renal) 0-10 0 - 10 /hpf   Casts Present (A) None [name] /lpf   Cast Type Granular casts (A) N/A   Bacteria, UA Few None [name]/Few  Urinalysis, Complete  Result Value Ref Range   Specific Gravity, UA 1.015 1.005 - 1.030   pH, UA 6.0 5.0 - 7.5   Color, UA Yellow Yellow   Appearance Ur Clear Clear   Leukocytes,UA Trace (A) Negative   Protein,UA Negative Negative/Trace   Glucose, UA Negative Negative   Ketones, UA Negative Negative   RBC, UA 2+ (A) Negative   Bilirubin, UA Negative Negative   Urobilinogen, Ur 0.2 0.2 - 1.0 mg/dL   Nitrite, UA Negative Negative   Microscopic Examination See below:       Assessment & Plan:   Problem List Items Addressed This Visit       Cardiovascular and Mediastinum   Chronic systolic heart failure (HCC)    Chronic, ongoing.  Euvolemic today.  Continue collaboration with cardiology and current medication regimen.  Recommend: - Reminded to call for an overnight weight gain of >2 pounds or a weekly weight weight of >5 pounds - not adding salt to his  food and has been reading food labels. Reviewed the importance of keeping daily sodium intake to 2000mg  daily  - Avoid NSAIDs - Would benefit repeat echo if symptoms worsen      Hypertension associated with type 2 diabetes mellitus (HCC)    Chronic, stable.  BP at goal for age on recheck.  Avoid hypotension. Continue current medication regimen and adjust as needed.  Urine ALB 30 (February 2024).  Losartan for kidney protection.  Refer to nephrology if worsening kidney function.  Recommend he monitor his BP regularly at home and follow DASH diet.  Continue to collaborate with cardiology.  LABS: CMP.  Return in 3 months.      Relevant Orders   Bayer DCA Hb A1c Waived   Comprehensive metabolic panel   Senile purpura (HCC)    Ongoing.  As evidenced by scattered bruises bilaterall upper extremities.  Gentle skin cleansers at home and lotion.  Monitor for skin breakdown, report  to provider if present.        Endocrine   Hyperlipidemia associated with type 2 diabetes mellitus (HCC)    Chronic, ongoing.  Continue current medication regimen and adjust as needed.  Lipid panel today.      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  Urine ALB 30 (February 2024).   Losartan for kidney protection.  Continue current medication regimen and adjust as needed.   Avoid hypoglycemia due to advanced age.  Will refer him to nephrology if worsening kidney function presents. Return in 3 months.  - ARB and Statin on board - Vaccinations up to date - Needs eye exam and recommend he schedule. - Foot exam up to date.      Relevant Orders   Bayer DCA Hb A1c Waived   Comprehensive metabolic panel     Genitourinary   Bladder cancer (HCC)    Chronic.  Continue to collaborate with urology, at length discussion on goals of care will continue at visits.        Other   Elevated TSH    Recheck thyroid labs today, history of elevations.  Start medication as needed.      Relevant Orders   TSH   T4, free   Iron deficiency anemia    Ongoing, stable.  Continue daily iron.  Recheck iron and B12 level today + CBC next visit.  If ongoing stability will stop iron supplement.      Other Visit Diagnoses     Coccyx pain       Referral to ortho placed for this and weakness to legs.   Relevant Orders   Ambulatory referral to Orthopedic Surgery        Follow up plan: Return in about 3 months (around 01/25/2023) for T2DM, HTN/HLD, HF, BLADDER CA + needs nurse wellness schedule for after 12/04/2022.

## 2022-10-25 NOTE — Assessment & Plan Note (Signed)
Chronic, stable.  BP at goal for age on recheck.  Avoid hypotension. Continue current medication regimen and adjust as needed.  Urine ALB 30 (February 2024).  Losartan for kidney protection.  Refer to nephrology if worsening kidney function.  Recommend he monitor his BP regularly at home and follow DASH diet.  Continue to collaborate with cardiology.  LABS: CMP.  Return in 3 months.

## 2022-10-25 NOTE — Assessment & Plan Note (Signed)
Ongoing, stable.  Continue daily iron.  Recheck iron and B12 level today + CBC next visit.  If ongoing stability will stop iron supplement.

## 2022-10-25 NOTE — Assessment & Plan Note (Signed)
Recheck thyroid labs today, history of elevations.  Start medication as needed.

## 2022-10-25 NOTE — Assessment & Plan Note (Signed)
Chronic.  Continue to collaborate with urology, at length discussion on goals of care will continue at visits.

## 2022-10-25 NOTE — Assessment & Plan Note (Signed)
Chronic, ongoing.  Continue current medication regimen and adjust as needed. Lipid panel today. 

## 2022-10-25 NOTE — Assessment & Plan Note (Signed)
Chronic, ongoing with A1c 6.8% today, stable for age.  Urine ALB 30 (February 2024).   Losartan for kidney protection.  Continue current medication regimen and adjust as needed.   Avoid hypoglycemia due to advanced age.  Will refer him to nephrology if worsening kidney function presents. Return in 3 months.  - ARB and Statin on board - Vaccinations up to date - Needs eye exam and recommend he schedule. - Foot exam up to date.

## 2022-10-25 NOTE — Assessment & Plan Note (Signed)
Ongoing.  As evidenced by scattered bruises bilaterall upper extremities.  Gentle skin cleansers at home and lotion.  Monitor for skin breakdown, report to provider if present.

## 2022-10-26 ENCOUNTER — Encounter: Payer: Self-pay | Admitting: Nurse Practitioner

## 2022-10-26 ENCOUNTER — Other Ambulatory Visit: Payer: Self-pay | Admitting: Nurse Practitioner

## 2022-10-26 DIAGNOSIS — I5022 Chronic systolic (congestive) heart failure: Secondary | ICD-10-CM

## 2022-10-26 LAB — LIPID PANEL W/O CHOL/HDL RATIO
Cholesterol, Total: 106 mg/dL (ref 100–199)
HDL: 50 mg/dL (ref >39)
LDL Chol Calc (NIH): 39 mg/dL (ref 0–99)
Triglycerides: 84 mg/dL (ref 0–149)
VLDL Cholesterol Cal: 17 mg/dL (ref 5–40)

## 2022-10-26 LAB — COMPREHENSIVE METABOLIC PANEL
ALT: 20 IU/L (ref 0–44)
AST: 29 IU/L (ref 0–40)
Albumin: 4.1 g/dL (ref 3.7–4.7)
Alkaline Phosphatase: 65 IU/L (ref 44–121)
BUN/Creatinine Ratio: 18 (ref 10–24)
BUN: 19 mg/dL (ref 8–27)
Bilirubin Total: 0.3 mg/dL (ref 0.0–1.2)
CO2: 29 mmol/L (ref 20–29)
Calcium: 9.2 mg/dL (ref 8.6–10.2)
Chloride: 105 mmol/L (ref 96–106)
Creatinine, Ser: 1.04 mg/dL (ref 0.76–1.27)
Globulin, Total: 2.4 g/dL (ref 1.5–4.5)
Glucose: 128 mg/dL — ABNORMAL HIGH (ref 70–99)
Potassium: 4.3 mmol/L (ref 3.5–5.2)
Sodium: 146 mmol/L — ABNORMAL HIGH (ref 134–144)
Total Protein: 6.5 g/dL (ref 6.0–8.5)
eGFR: 71 mL/min/{1.73_m2} (ref >59)

## 2022-10-26 LAB — T4, FREE: Free T4: 1.16 ng/dL (ref 0.82–1.77)

## 2022-10-26 LAB — TSH: TSH: 4.88 u[IU]/mL — ABNORMAL HIGH (ref 0.450–4.500)

## 2022-10-26 MED ORDER — LOSARTAN POTASSIUM 25 MG PO TABS: ORAL_TABLET | ORAL | 4 refills | Status: DC

## 2022-10-26 MED ORDER — METOPROLOL TARTRATE 25 MG PO TABS: 25.0000 mg | ORAL_TABLET | Freq: Two times a day (BID) | ORAL | 4 refills | Status: DC

## 2022-10-26 MED ORDER — POTASSIUM CHLORIDE CRYS ER 10 MEQ PO TBCR: 10.0000 meq | EXTENDED_RELEASE_TABLET | Freq: Every day | ORAL | 4 refills | Status: DC

## 2022-10-26 MED ORDER — FUROSEMIDE 40 MG PO TABS: 20.0000 mg | ORAL_TABLET | Freq: Every day | ORAL | 4 refills | Status: DC

## 2022-10-26 NOTE — Progress Notes (Signed)
Contacted via MyChart   Good evening John [name], your labs have returned: - Kidney function, creatinine and eGFR, remains normal, as is liver function, AST and ALT.  - Sodium (salt) level is mildly elevated, cut back on any salt rich foods or drinks. - TSH is mildly elevated, Free T4 normal.  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 We will not start medication yet.  We will recheck at next visit.   - Remainder of labs look good.  Any questions? Keep being amazing!!  Thank you for allowing me to participate in your care.  I appreciate you. Kindest regards, Herma Uballe

## 2022-10-27 ENCOUNTER — Other Ambulatory Visit (HOSPITAL_COMMUNITY): Payer: Self-pay

## 2022-10-27 NOTE — Telephone Encounter (Signed)
Requested Prescriptions  Pending Prescriptions Disp Refills   JANUMET XR 50-1000 MG TB24 [Pharmacy Med Name: Janumet XR 50-1000 MG Oral Tablet Extended Release 24 Hour] 180 tablet 0    Sig: Take 1 tablet by mouth twice daily     Endocrinology:  Diabetes - Biguanide + DPP-4 Inhibitor Combos Passed - 10/26/2022 11:41 AM      Passed - HBA1C is between 0 and 7.9 and within 180 days    Hemoglobin A1C  Date Value Ref Range Status  11/25/2015 6.6%  Final   HB A1C (BAYER DCA - WAIVED)  Date Value Ref Range Status  10/25/2022 6.8 (H) 4.8 - 5.6 % Final    Comment:             Prediabetes: 5.7 - 6.4          Diabetes: >6.4          Glycemic control for adults with diabetes: <7.0          Passed - Cr in normal range and within 360 days    Creatinine  Date Value Ref Range Status  12/13/2012 0.98 0.60 - 1.30 mg/dL Final   Creatinine, Ser  Date Value Ref Range Status  10/25/2022 1.04 0.76 - 1.27 mg/dL Final         Passed - eGFR in normal range and within 360 days    EGFR (African American)  Date Value Ref Range Status  12/13/2012 >60  Final   GFR calc Af Amer  Date Value Ref Range Status  03/12/2020 69 >59 mL/min/1.73 Final    Comment:    **In accordance with recommendations from the NKF-ASN Task force,**   [facility] is in the process of updating its eGFR calculation to the   2021 CKD-EPI creatinine equation that estimates kidney function   without a race variable.    EGFR (Non-African Amer.)  Date Value Ref Range Status  12/13/2012 >60  Final    Comment:    eGFR values <46mL/min/1.73 m2 may be an indication of chronic kidney disease (CKD). Calculated eGFR is useful in patients with stable renal function. The eGFR calculation will not be reliable in acutely ill patients when serum creatinine is changing rapidly. It is not useful in  patients on dialysis. The eGFR calculation may not be applicable to patients at the low and high extremes of body sizes, pregnant women, and  vegetarians.    GFR, Estimated  Date Value Ref Range Status  08/02/2022 >60 >60 mL/min Final    Comment:    (NOTE) Calculated using the CKD-EPI Creatinine Equation (2021)    eGFR  Date Value Ref Range Status  10/25/2022 71 >59 mL/min/1.73 Final         Passed - B12 Level in normal range and within 720 days    Vitamin B-12  Date Value Ref Range Status  01/15/2021 957 232 - 1,245 pg/mL Final         Passed - Valid encounter within last 6 months    Recent Outpatient Visits           2 days ago Type 2 diabetes mellitus with proteinuria (HCC)   [place] [facility] Knier[place], Ayr T, NP   6 months ago Type 2 diabetes mellitus with proteinuria (HCC)   [place] [facility] [place], [place] T, NP   1 year ago Type 2 diabetes mellitus with stage 3a chronic kidney disease, without long-term current use of insulin (HCC)   [place]  [facility] [place], Quinlan T, NP   1 year ago Type 2 diabetes mellitus with stage 3a chronic kidney disease, without long-term current use of insulin (HCC)   [place] [facility] [place], [place] T, NP   1 year ago Type 2 diabetes mellitus with stage 3a chronic kidney disease, without long-term current use of insulin (HCC)   [place] [facility] [place], Diaz T, NP       Future Appointments             In 3 months Cannady, Dorie Rank, NP [place] [facility]            Passed - CBC within normal limits and completed in the last 12 months    WBC  Date Value Ref Range Status  08/02/2022 6.6 4.0 - 10.5 K/uL Final   RBC  Date Value Ref Range Status  08/02/2022 4.17 (L) 4.22 - 5.81 MIL/uL Final   Hemoglobin  Date Value Ref Range Status  08/02/2022 12.2 (L) 13.0 - 17.0 g/dL Final  64/40/3474 25.9 (L) 13.0 - 17.7 g/dL Final   HCT  Date Value Ref Range Status  08/02/2022 37.7 (L) 39.0 - 52.0 % Final   Hematocrit  Date Value Ref Range Status   04/26/2022 40.3 37.5 - 51.0 % Final   MCHC  Date Value Ref Range Status  08/02/2022 32.4 30.0 - 36.0 g/dL Final   [facility]  Date Value Ref Range Status  08/02/2022 29.3 26.0 - 34.0 pg Final   MCV  Date Value Ref Range Status  08/02/2022 90.4 80.0 - 100.0 fL Final  04/26/2022 89 79 - 97 fL Final   No results found for: "PLTCOUNTKUC", "LABPLAT", "POCPLA" RDW  Date Value Ref Range Status  08/02/2022 12.8 11.5 - 15.5 % Final  04/26/2022 12.6 11.6 - 15.4 % Final          atorvastatin (LIPITOR) 40 MG tablet [Pharmacy Med Name: Atorvastatin Calcium 40 MG Oral Tablet] 90 tablet 0    Sig: TAKE 1 TABLET BY MOUTH ONCE DAILY AT  6PM     Cardiovascular:  Antilipid - Statins Failed - 10/26/2022 11:41 AM      Failed - Lipid Panel in normal range within the last 12 months    Cholesterol, Total  Date Value Ref Range Status  10/25/2022 106 100 - 199 mg/dL Final   Cholesterol Piccolo, Waived  Date Value Ref Range Status  05/19/2015 192 <200 mg/dL Final    Comment:                            Desirable                <200                         Borderline High      200- 239                         High                     >239    LDL Chol Calc (NIH)  Date Value Ref Range Status  10/25/2022 39 0 - 99 mg/dL Final   HDL  Date Value Ref Range Status  10/25/2022 50 >39 mg/dL Final   Triglycerides  Date Value Ref Range Status  10/25/2022 84 0 - 149 mg/dL Final   Triglycerides Piccolo,Waived  Date Value Ref Range Status  05/19/2015 106 <150 mg/dL Final    Comment:                            Normal                   <150                         Borderline High     150 - 199                         High                200 - 499                         Very High                >499          Passed - Patient is not pregnant      Passed - Valid encounter within last 12 months    Recent Outpatient Visits           2 days ago Type 2 diabetes mellitus with proteinuria (HCC)   [facility] [place], [place] T, NP   6 months ago Type 2 diabetes mellitus with proteinuria (HCC)   [place] [facility] [place], Corrie Dandy T, NP   1 year ago Type 2 diabetes mellitus with stage 3a chronic kidney disease, without long-term current use of insulin (HCC)   [place] [facility] [place], [place] T, NP   1 year ago Type 2 diabetes mellitus with stage 3a chronic kidney disease, without long-term current use of insulin (HCC)   [place] [facility] [place], [place] T, NP   1 year ago Type 2 diabetes mellitus with stage 3a chronic kidney disease, without long-term current use of insulin (HCC)   [place] [facility] [place], [place]ko T, NP       Future Appointments             In 3 months Cannady, Dorie Rank, NP [place] [facility]             furosemide (LASIX) 40 MG tablet [Pharmacy Med Name: Furosemide 40 MG Oral Tablet] 45 tablet 0    Sig: TAKE ONE-HALF TABLET BY MOUTH ONCE DAILY     Cardiovascular:  Diuretics - Loop Failed - 10/26/2022 11:41 AM      Failed - Na in normal range and within 180 days    Sodium  Date Value Ref Range Status  10/25/2022 146 (H) 134 - 144 mmol/L Final  12/13/2012 139 136 - 145 mmol/L Final         Failed - Mg Level in normal range and within 180 days    Magnesium  Date Value Ref Range Status  08/15/2018 1.8 1.6 - 2.3 mg/dL Final         Passed - K in normal range and within 180 days    Potassium  Date Value Ref Range Status  10/25/2022 4.3 3.5 - 5.2 mmol/L Final  12/13/2012 3.4 (L) 3.5 - 5.1 mmol/L Final         Passed -  Ca in normal range and within 180 days    Calcium  Date Value Ref Range Status  10/25/2022 9.2 8.6 - 10.2 mg/dL Final   Calcium, Total  Date Value Ref Range Status  12/13/2012 9.0 8.5 - 10.1 mg/dL Final   Calcium, Ion  Date Value Ref Range Status  05/06/2017 1.06 (L) 1.15 - 1.40 mmol/L Final         Passed -  Cr in normal range and within 180 days    Creatinine  Date Value Ref Range Status  12/13/2012 0.98 0.60 - 1.30 mg/dL Final   Creatinine, Ser  Date Value Ref Range Status  10/25/2022 1.04 0.76 - 1.27 mg/dL Final         Passed - Cl in normal range and within 180 days    Chloride  Date Value Ref Range Status  10/25/2022 105 96 - 106 mmol/L Final  12/13/2012 103 98 - 107 mmol/L Final         Passed - Last BP in normal range    BP Readings from Last 1 Encounters:  10/25/22 134/68         Passed - Valid encounter within last 6 months    Recent Outpatient Visits           2 days ago Type 2 diabetes mellitus with proteinuria (HCC)   [place] [facility] [place], Corrie Dandy T, NP   6 months ago Type 2 diabetes mellitus with proteinuria (HCC)   [place] [facility] [place], Corrie Dandy T, NP   1 year ago Type 2 diabetes mellitus with stage 3a chronic kidney disease, without long-term current use of insulin (HCC)   [place] [facility] [place], [place] T, NP   1 year ago Type 2 diabetes mellitus with stage 3a chronic kidney disease, without long-term current use of insulin (HCC)   [place] [facility] [place], [place] T, NP   1 year ago Type 2 diabetes mellitus with stage 3a chronic kidney disease, without long-term current use of insulin (HCC)   [place] [facility] [place], [place] T, NP       Future Appointments             In 3 months Cannady, Dorie Rank, NP [place] [facility]             losartan (COZAAR) 25 MG tablet [Pharmacy Med Name: Losartan Potassium 25 MG Oral Tablet] 45 tablet 0    Sig: TAKE ONE-HALF TABLET BY MOUTH ONCE DAILY     Cardiovascular:  Angiotensin Receptor Blockers Passed - 10/26/2022 11:41 AM      Passed - Cr in normal range and within 180 days    Creatinine  Date Value Ref Range Status  12/13/2012 0.98 0.60 - 1.30 mg/dL Final   Creatinine, Ser  Date  Value Ref Range Status  10/25/2022 1.04 0.76 - 1.27 mg/dL Final         Passed - K in normal range and within 180 days    Potassium  Date Value Ref Range Status  10/25/2022 4.3 3.5 - 5.2 mmol/L Final  12/13/2012 3.4 (L) 3.5 - 5.1 mmol/L Final         Passed - Patient is not pregnant      Passed - Last BP in normal range    BP Readings from Last 1 Encounters:  10/25/22 134/68         Passed - Valid encounter within last  6 months    Recent Outpatient Visits           2 days ago Type 2 diabetes mellitus with proteinuria (HCC)   [place] [facility] [place], [place] T, NP   6 months ago Type 2 diabetes mellitus with proteinuria (HCC)   [place] [facility] [place], Corrie Dandy T, NP   1 year ago Type 2 diabetes mellitus with stage 3a chronic kidney disease, without long-term current use of insulin (HCC)   [place] [facility] [place], [place] T, NP   1 year ago Type 2 diabetes mellitus with stage 3a chronic kidney disease, without long-term current use of insulin (HCC)   [place] [facility] [place], [place] Hall T, NP   1 year ago Type 2 diabetes mellitus with stage 3a chronic kidney disease, without long-term current use of insulin (HCC)   [place] [facility] [place], [place] T, NP       Future Appointments             In 3 months Cannady, Dorie Rank, NP [place] [facility]             potassium chloride (KLOR-CON) 10 MEQ tablet [Pharmacy Med Name: Potassium Chloride ER 10 MEQ Oral Tablet Extended Release] 90 tablet 0    Sig: Take 1 tablet by mouth once daily     Endocrinology:  Minerals - Potassium Supplementation Passed - 10/26/2022 11:41 AM      Passed - K in normal range and within 360 days    Potassium  Date Value Ref Range Status  10/25/2022 4.3 3.5 - 5.2 mmol/L Final  12/13/2012 3.4 (L) 3.5 - 5.1 mmol/L Final         Passed - Cr in normal range and within 360 days     Creatinine  Date Value Ref Range Status  12/13/2012 0.98 0.60 - 1.30 mg/dL Final   Creatinine, Ser  Date Value Ref Range Status  10/25/2022 1.04 0.76 - 1.27 mg/dL Final         Passed - Valid encounter within last 12 months    Recent Outpatient Visits           2 days ago Type 2 diabetes mellitus with proteinuria (HCC)   [place] [facility] [place], Kenneth T, NP   6 months ago Type 2 diabetes mellitus with proteinuria (HCC)   [place] [facility] [place], [place] T, NP   1 year ago Type 2 diabetes mellitus with stage 3a chronic kidney disease, without long-term current use of insulin (HCC)   [place] [facility] [place], Lynd T, NP   1 year ago Type 2 diabetes mellitus with stage 3a chronic kidney disease, without long-term current use of insulin (HCC)   [place] [facility] [place], [place] T, NP   1 year ago Type 2 diabetes mellitus with stage 3a chronic kidney disease, without long-term current use of insulin (HCC)   [place] [facility] [place], Dorie Rank, NP       Future Appointments             In 3 months Cannady, [place] T, NP [place] [facility]             metoprolol tartrate (LOPRESSOR) 25 MG tablet [Pharmacy Med Name: Metoprolol Tartrate 25 MG Oral Tablet] 180 tablet 0    Sig: Take 1 tablet by mouth twice daily  Cardiovascular:  Beta Blockers Passed - 10/26/2022 11:41 AM      Passed - Last BP in normal range    BP Readings from Last 1 Encounters:  10/25/22 134/68         Passed - Last Heart Rate in normal range    Pulse Readings from Last 1 Encounters:  10/25/22 (!) 49         Passed - Valid encounter within last 6 months    Recent Outpatient Visits           2 days ago Type 2 diabetes mellitus with proteinuria (HCC)   [place] [facility] [place], [place] T, NP   6 months ago Type 2 diabetes mellitus with proteinuria (HCC)   [facility] [place], [place] T, NP   1 year ago Type 2 diabetes mellitus with stage 3a chronic kidney disease, without long-term current use of insulin (HCC)   [place] [facility] [place], [place] T, NP   1 year ago Type 2 diabetes mellitus with stage 3a chronic kidney disease, without long-term current use of insulin (HCC)   [place] [facility] [place], Raywick T, NP   1 year ago Type 2 diabetes mellitus with stage 3a chronic kidney disease, without long-term current use of insulin (HCC)   [place] [facility] [place], Dorie Rank, NP       Future Appointments             In 3 months Cannady, Dorie Rank, NP [place] [facility]

## 2022-11-03 ENCOUNTER — Encounter: Payer: Self-pay | Admitting: Pharmacist

## 2022-11-03 NOTE — Progress Notes (Signed)
Pharmacy Quality Measure Review  This patient is appearing on a report for being at risk of failing the adherence measure for diabetes medications this calendar year.   Medication: Janumet XR 50-1000 mg Last fill date: 09/08/22 for 90 day supply  Insurance report was not up to date. No action needed at this time.   Adam Phenix, PharmD PGY-1 Pharmacy Resident

## 2022-11-09 DIAGNOSIS — S322XXA Fracture of coccyx, initial encounter for closed fracture: Secondary | ICD-10-CM | POA: Diagnosis not present

## 2022-11-09 DIAGNOSIS — M533 Sacrococcygeal disorders, not elsewhere classified: Secondary | ICD-10-CM | POA: Diagnosis not present

## 2022-11-13 ENCOUNTER — Encounter: Payer: Self-pay | Admitting: Nurse Practitioner

## 2022-11-15 DIAGNOSIS — Z23 Encounter for immunization: Secondary | ICD-10-CM | POA: Diagnosis not present

## 2022-11-15 DIAGNOSIS — Z951 Presence of aortocoronary bypass graft: Secondary | ICD-10-CM | POA: Diagnosis not present

## 2022-11-15 DIAGNOSIS — I10 Essential (primary) hypertension: Secondary | ICD-10-CM | POA: Diagnosis not present

## 2022-11-15 DIAGNOSIS — Z952 Presence of prosthetic heart valve: Secondary | ICD-10-CM | POA: Diagnosis not present

## 2022-11-15 DIAGNOSIS — I350 Nonrheumatic aortic (valve) stenosis: Secondary | ICD-10-CM | POA: Diagnosis not present

## 2022-11-15 DIAGNOSIS — E785 Hyperlipidemia, unspecified: Secondary | ICD-10-CM | POA: Diagnosis not present

## 2022-11-15 DIAGNOSIS — E119 Type 2 diabetes mellitus without complications: Secondary | ICD-10-CM | POA: Diagnosis not present

## 2022-11-15 NOTE — Telephone Encounter (Signed)
Called patient he stated that he is supposed to go see his cardiologist today and that he was going to let him know about it.  I offered the 4:20 pm slot for tomorrow and he said that if the cardiologist says he needs to get it checked out by his PCP then he would call us back.  I advised to leave the appointment and if he did not need it to call us back and cancel it to make sure that it doesn't get filled.  He stated he would call to cancel if he doesn't need it.

## 2022-11-16 ENCOUNTER — Ambulatory Visit (INDEPENDENT_AMBULATORY_CARE_PROVIDER_SITE_OTHER): Payer: PPO | Admitting: Nurse Practitioner

## 2022-11-16 ENCOUNTER — Encounter: Payer: Self-pay | Admitting: Nurse Practitioner

## 2022-11-16 VITALS — BP 138/74 | HR 65 | Temp 97.6°F | Wt 204.4 lb

## 2022-11-16 DIAGNOSIS — R7989 Other specified abnormal findings of blood chemistry: Secondary | ICD-10-CM | POA: Diagnosis not present

## 2022-11-16 DIAGNOSIS — M898X1 Other specified disorders of bone, shoulder: Secondary | ICD-10-CM | POA: Diagnosis not present

## 2022-11-16 NOTE — Assessment & Plan Note (Signed)
Ongoing and fluctuating.  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  Some mild thyromegaly noted today.  Recent labs showed mild elevation in TSH.  Order for thyroid ultrasound placed.  Discussed with patient.

## 2022-11-16 NOTE — Assessment & Plan Note (Signed)
Suspect related to arthritic changes, only hurts if touch area.  Recommend use of Voltaren gel or Icy/Hot as needed.  No red flags on exam.  Will monitor closely and obtain imaging as needed.

## 2022-11-16 NOTE — Progress Notes (Signed)
BP 138/74 (BP Location: Left Arm)   Pulse 65   Temp 97.6 F (36.4 C) (Oral)   Wt 204 lb 6.4 oz (92.7 kg)   SpO2 97%   BMI 27.72 kg/m    Subjective:    Patient ID: John [name], male    DOB: April 14, 1940, 82 y.o.   MRN: [id number]  HPI: John [name] is a 82 y.o. male  Chief Complaint  Patient presents with   Pain   NECK PAIN  Reports having some anterior neck pain 3-4 days ago.  To medial aspect of left clavicle.  Only hurts when touches it, other than this it does not hurt.  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  Recent labs noted TSH mildly elevated, this fluctuates. Treatments attempted: none  Relief with NSAIDs?:  No NSAIDs Taken Location: medial left clavicle Duration:days Severity: 8/10 Quality: sharp, shooting, and throbbing Frequency: intermittent Radiation: none Aggravating factors: touching area Alleviating factors: nothing tried Weakness:  no Paresthesias / decreased sensation:  no  Fevers:  no   Relevant past medical, surgical, family and social history reviewed and updated as indicated. Interim medical history since our last visit reviewed. Allergies and medications reviewed and updated.  Review of Systems  Constitutional:  Negative for activity change, diaphoresis, fatigue and fever.  Respiratory:  Negative for cough, chest tightness, shortness of breath and wheezing.   Cardiovascular:  Negative for chest pain, palpitations and leg swelling.  Gastrointestinal: Negative.   Musculoskeletal:  Positive for arthralgias.  Neurological: Negative.   Psychiatric/Behavioral: Negative.      Per HPI unless specifically indicated above     Objective:    BP 138/74 (BP Location: Left Arm)   Pulse 65   Temp 97.6 F (36.4 C) (Oral)   Wt 204 lb 6.4 oz (92.7 kg)   SpO2 97%   BMI 27.72 kg/m   Wt Readings from Last 3 Encounters:  11/16/22 204 lb 6.4 oz (92.7 kg)  10/25/22 203 lb 9.6  oz (92.4 kg)  08/18/22 203 lb (92.1 kg)    Physical Exam Vitals and nursing note reviewed.  Constitutional:      General: He is awake. He is not in acute distress.    Appearance: He is well-developed and overweight. He is not ill-appearing.  HENT:     Head: Normocephalic and atraumatic.     Right Ear: Hearing normal. No drainage.     Left Ear: Hearing normal. No drainage.  Eyes:     General: Lids are normal.        Right eye: No discharge.        Left eye: No discharge.     Conjunctiva/sclera: Conjunctivae normal.     Pupils: Pupils are equal, round, and reactive to light.  Neck:     Thyroid: Thyromegaly (mild noted) present.     Vascular: No carotid bruit.  Cardiovascular:     Rate and Rhythm: Normal rate and regular rhythm.     Heart sounds: S1 normal and S2 normal. Murmur heard.     Systolic murmur is present with a grade of 2/6.     No gallop.  Pulmonary:     Effort: Pulmonary effort is normal. No accessory muscle usage or respiratory distress.     Breath sounds: Normal breath sounds.  Abdominal:     General: Bowel sounds are normal.     Palpations: Abdomen is soft.  Musculoskeletal:  General: Normal range of motion.       Arms:     Cervical back: Normal range of motion and neck supple.     Right lower leg: 1+ Edema present.     Left lower leg: 1+ Edema present.  Lymphadenopathy:     Cervical: No cervical adenopathy.  Skin:    General: Skin is warm and dry.     Capillary Refill: Capillary refill takes less than 2 seconds.     Comments: Scattered pale purple bruises noted to bilateral upper extremities, skin intact.  Neurological:     Mental Status: He is alert and oriented to person, place, and time.     Deep Tendon Reflexes: Reflexes are normal and symmetric.  Psychiatric:        Attention and Perception: Attention normal.        Mood and Affect: Mood normal.        Speech: Speech normal.        Behavior: Behavior normal. Behavior is cooperative.         Thought Content: Thought content normal.     Results for orders placed or performed in visit on 10/25/22  Bayer DCA Hb A1c Waived  Result Value Ref Range   HB A1C (BAYER DCA - WAIVED) 6.8 (H) 4.8 - 5.6 %  Comprehensive metabolic panel  Result Value Ref Range   Glucose 128 (H) 70 - 99 mg/dL   BUN 19 8 - 27 mg/dL   Creatinine, Ser 1.61 0.76 - 1.27 mg/dL   eGFR 71 >09 UE/AVW/0.98   BUN/Creatinine Ratio 18 10 - 24   Sodium 146 (H) 134 - 144 mmol/L   Potassium 4.3 3.5 - 5.2 mmol/L   Chloride 105 96 - 106 mmol/L   CO2 29 20 - 29 mmol/L   Calcium 9.2 8.6 - 10.2 mg/dL   Total Protein 6.5 6.0 - 8.5 g/dL   Albumin 4.1 3.7 - 4.7 g/dL   Globulin, Total 2.4 1.5 - 4.5 g/dL   Bilirubin Total 0.3 0.0 - 1.2 mg/dL   Alkaline Phosphatase 65 44 - 121 IU/L   AST 29 0 - 40 IU/L   ALT 20 0 - 44 IU/L  Lipid Panel w/o Chol/HDL Ratio  Result Value Ref Range   Cholesterol, Total 106 100 - 199 mg/dL   Triglycerides 84 0 - 149 mg/dL   HDL 50 >11 mg/dL   VLDL Cholesterol Cal 17 5 - 40 mg/dL   LDL Chol Calc (NIH) 39 0 - 99 mg/dL  TSH  Result Value Ref Range   TSH 4.880 (H) 0.450 - 4.500 uIU/mL  T4, free  Result Value Ref Range   Free T4 1.16 0.82 - 1.77 ng/dL      Assessment & Plan:   Problem List Items Addressed This Visit       Other   Elevated TSH - Primary    Ongoing and fluctuating.  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  Some mild thyromegaly noted today.  Recent labs showed mild elevation in TSH.  Order for thyroid ultrasound placed.  Discussed with patient.      Relevant Orders   US THYROID   Pain of left clavicle    Suspect related to arthritic changes, only hurts if touch area.  Recommend use of Voltaren gel or Icy/Hot as needed.  No red flags on exam.  Will monitor closely and obtain imaging as needed.        Follow up plan: Return for as scheduled in upcoming months.

## 2022-11-16 NOTE — Patient Instructions (Signed)

## 2022-11-22 ENCOUNTER — Ambulatory Visit
Admission: RE | Admit: 2022-11-22 | Discharge: 2022-11-22 | Disposition: A | Discharge status: Home / Self Care | Payer: PPO | Source: Ambulatory Visit | Attending: Nurse Practitioner | Admitting: Nurse Practitioner

## 2022-11-22 DIAGNOSIS — R7989 Other specified abnormal findings of blood chemistry: Secondary | ICD-10-CM | POA: Insufficient documentation

## 2022-11-22 DIAGNOSIS — E042 Nontoxic multinodular goiter: Secondary | ICD-10-CM | POA: Diagnosis not present

## 2022-11-22 NOTE — Progress Notes (Signed)
Contacted via MyChart   Good evening [place], your ultrasound has returned and overall is stable.  You do have some nodules but all appear benign and they do not recommend biopsy of these.  Any questions? Keep being amazing!!  Thank you for allowing me to participate in your care.  I appreciate you. Kindest regards, Kehlani Vancamp s

## 2022-11-30 DIAGNOSIS — Z951 Presence of aortocoronary bypass graft: Secondary | ICD-10-CM | POA: Diagnosis not present

## 2022-11-30 DIAGNOSIS — Z952 Presence of prosthetic heart valve: Secondary | ICD-10-CM | POA: Diagnosis not present

## 2022-11-30 DIAGNOSIS — I350 Nonrheumatic aortic (valve) stenosis: Secondary | ICD-10-CM | POA: Diagnosis not present

## 2022-12-23 ENCOUNTER — Other Ambulatory Visit: Payer: PPO | Admitting: Urology

## 2022-12-28 ENCOUNTER — Ambulatory Visit: Payer: PPO | Admitting: Emergency Medicine

## 2022-12-28 VITALS — Ht 72.0 in | Wt 203.0 lb

## 2022-12-28 DIAGNOSIS — Z0000 Encounter for general adult medical examination without abnormal findings: Secondary | ICD-10-CM | POA: Diagnosis not present

## 2022-12-28 NOTE — Patient Instructions (Addendum)
John [name] , Thank you for taking time to come for your Medicare Wellness Visit. I appreciate your ongoing commitment to your health goals. Please review the following plan we discussed and let me know if I can assist you in the future.   Referrals/Orders/Follow-Ups/Clinician Recommendations: Call [facility] @ [phone] to schedule a diabetic eye exam at your earliest convenience. You should be getting these every year. Get the flu, covid and RSV vaccines at your earliest convenience as discussed.  This is a list of the screening recommended for you and due dates:  Health Maintenance  Topic Date Due   Eye exam for diabetics  09/08/2022   COVID-19 Vaccine (5 - 2023-24 season) 10/31/2022   Zoster (Shingles) Vaccine (1 of 2) 01/23/2023*   Flu Shot  05/30/2023*   Yearly kidney health urinalysis for diabetes  04/27/2023   Complete foot exam   04/27/2023   Hemoglobin A1C  04/27/2023   Yearly kidney function blood test for diabetes  10/25/2023   Medicare Annual Wellness Visit  12/28/2023   DTaP/Tdap/Td vaccine (2 - Tdap) 04/26/2032   Pneumonia Vaccine  Completed   HPV Vaccine  Aged Out   Colon Cancer Screening  Discontinued  *Topic was postponed. The date shown is not the original due date.    Advanced directives: (In Chart) A copy of your advanced directives are scanned into your chart should your provider ever need it.  Next Medicare Annual Wellness Visit scheduled for next year: Yes, 01/03/24 @ 8:00am  Fall Prevention in the Home, Adult Falls can cause injuries and affect people of all ages. There are many simple things that you can do to make your home safe and to help prevent falls. If you need it, ask for help making these changes. What actions can I take to prevent falls? General information Use good lighting in all rooms. Make sure to: Replace any light bulbs that burn out. Turn on lights if it is dark and use night-lights. Keep items that you use often in easy-to-reach  places. Lower the shelves around your home if needed. Move furniture so that there are clear paths around it. Do not keep throw rugs or other things on the floor that can make you trip. If any of your floors are uneven, fix them. Add color or contrast paint or tape to clearly mark and help you see: Grab bars or handrails. First and last steps of staircases. Where the edge of each step is. If you use a ladder or stepladder: Make sure that it is fully opened. Do not climb a closed ladder. Make sure the sides of the ladder are locked in place. Have someone hold the ladder while you use it. Know where your pets are as you move through your home. What can I do in the bathroom?     Keep the floor dry. Clean up any water that is on the floor right away. Remove soap buildup in the bathtub or shower. Buildup makes bathtubs and showers slippery. Use non-skid mats or decals on the floor of the bathtub or shower. Attach bath mats securely with double-sided, non-slip rug tape. If you need to sit down while you are in the shower, use a non-slip stool. Install grab bars by the toilet and in the bathtub and shower. Do not use towel bars as grab bars. What can I do in the bedroom? Make sure that you have a light by your bed that is easy to reach. Do not use any sheets  or blankets on your bed that hang to the floor. Have a firm bench or chair with side arms that you can use for support when you get dressed. What can I do in the kitchen? Clean up any spills right away. If you need to reach something above you, use a sturdy step stool that has a grab bar. Keep electrical cables out of the way. Do not use floor polish or wax that makes floors slippery. What can I do with my stairs? Do not leave anything on the stairs. Make sure that you have a light switch at the top and the bottom of the stairs. Have them installed if you do not have them. Make sure that there are handrails on both sides of the  stairs. Fix handrails that are broken or loose. Make sure that handrails are as long as the staircases. Install non-slip stair treads on all stairs in your home if they do not have carpet. Avoid having throw rugs at the top or bottom of stairs, or secure the rugs with carpet tape to prevent them from moving. Choose a carpet design that does not hide the edge of steps on the stairs. Make sure that carpet is firmly attached to the stairs. Fix any carpet that is loose or worn. What can I do on the outside of my home? Use bright outdoor lighting. Repair the edges of walkways and driveways and fix any cracks. Clear paths of anything that can make you trip, such as tools or rocks. Add color or contrast paint or tape to clearly mark and help you see high doorway thresholds. Trim any bushes or trees on the main path into your home. Check that handrails are securely fastened and in good repair. Both sides of all steps should have handrails. Install guardrails along the edges of any raised decks or porches. Have leaves, snow, and ice cleared regularly. Use sand, salt, or ice melt on walkways during winter months if you live where there is ice and snow. In the garage, clean up any spills right away, including grease or oil spills. What other actions can I take? Review your medicines with your health care provider. Some medicines can make you confused or feel dizzy. This can increase your chance of falling. Wear closed-toe shoes that fit well and support your feet. Wear shoes that have rubber soles and low heels. Use a cane, walker, scooter, or crutches that help you move around if needed. Talk with your provider about other ways that you can decrease your risk of falls. This may include seeing a physical therapist to learn to do exercises to improve movement and strength. Where to find more information Centers for Disease Control and Prevention, [employer]: TonerPromos.no [employer] on Aging:  BaseRingTones.pl [employer] Institute on Aging: BaseRingTones.pl Contact a health care provider if: You are afraid of falling at home. You feel weak, drowsy, or dizzy at home. You fall at home. Get help right away if you: Lose consciousness or have trouble moving after a fall. Have a fall that causes a head injury. These symptoms may be an emergency. Get help right away. Call 911. Do not wait to see if the symptoms will go away. Do not drive yourself to the hospital. This information is not intended to replace advice given to you by your health care provider. Make sure you discuss any questions you have with your health care provider. Document Revised: 10/19/2021 Document Reviewed: 10/19/2021 Elsevier Patient Education  2024 [employer].

## 2022-12-28 NOTE — Progress Notes (Signed)
Subjective:   John [name] is a 82 y.o. male who presents for Medicare Annual/Subsequent preventive examination.  Visit Complete: Virtual I connected with  John [name] on 12/28/22 by a audio enabled telemedicine application and verified that I am speaking with the correct person using two identifiers.  Patient Location: Home  Provider Location: Office/Clinic  I discussed the limitations of evaluation and management by telemedicine. The patient expressed understanding and agreed to proceed.  Vital Signs: Because this visit was a virtual/telehealth visit, some criteria may be missing or patient reported. Any vitals not documented were not able to be obtained and vitals that have been documented are patient reported.   Cardiac Risk Factors include: advanced age (>21men, >64 women);male gender;diabetes mellitus;dyslipidemia;hypertension     Objective:    Today's Vitals   12/28/22 0816  Weight: 203 lb (92.1 kg)  Height: 6' (1.829 m)   Body mass index is 27.53 kg/m.     12/28/2022    8:27 AM 08/10/2022    7:10 AM 08/02/2022   12:42 PM 12/03/2021   12:35 PM 05/30/2021    7:10 PM 12/01/2020    1:04 PM 09/16/2020    8:21 AM  Advanced Directives  Does Patient Have a Medical Advance Directive? Yes Yes Yes No Yes Yes Yes  Type of Estate agent of [place];Living will Healthcare Power of [employer] [place];Living will Out of facility DNR (pink MOST or yellow form) --  Does patient want to make changes to medical advance directive? No - Patient declined No - Patient declined     No - Patient declined  Copy of Healthcare Power of Attorney in Chart? Yes - validated most recent copy scanned in chart (See row information) No - copy requested       Would patient like information on creating a medical advance directive?    No - Patient declined       Current Medications (verified) Outpatient Encounter Medications as of 12/28/2022  Medication Sig    Ascorbic Acid (VITAMIN C) 1000 MG tablet Take 1,000 mg by mouth daily.   aspirin EC 81 MG tablet Take 81 mg by mouth daily. Swallow whole.   atorvastatin (LIPITOR) 40 MG tablet TAKE 1 TABLET BY MOUTH ONCE DAILY AT  6PM   Cholecalciferol 25 MCG (1000 UT) capsule Take 1,000 Units by mouth daily.   cyanocobalamin (VITAMIN B12) 1000 MCG tablet Take 1,000 mcg by mouth daily.   furosemide (LASIX) 40 MG tablet TAKE ONE-HALF TABLET BY MOUTH ONCE DAILY   Iron, Ferrous Sulfate, 142 (45 Fe) MG TBCR Take 45 mg by mouth daily.   JANUMET XR 50-1000 MG TB24 Take 1 tablet by mouth twice daily   losartan (COZAAR) 25 MG tablet TAKE ONE-HALF TABLET BY MOUTH ONCE DAILY   metoprolol tartrate (LOPRESSOR) 25 MG tablet Take 1 tablet (25 mg total) by mouth 2 (two) times daily.   naproxen sodium (ALEVE) 220 MG tablet Take 220 mg by mouth daily as needed.   potassium chloride (KLOR-CON M) 10 MEQ tablet Take 1 tablet (10 mEq total) by mouth daily.   furosemide (LASIX) 40 MG tablet Take 0.5 tablets (20 mg total) by mouth daily. (Patient not taking: Reported on 12/28/2022)   losartan (COZAAR) 25 MG tablet TAKE ONE-HALF TABLET BY MOUTH ONCE DAILY (Patient not taking: Reported on 12/28/2022)   metoprolol tartrate (LOPRESSOR) 25 MG tablet Take 1 tablet by mouth twice daily (Patient not taking: Reported on 12/28/2022)  potassium chloride (KLOR-CON) 10 MEQ tablet Take 1 tablet by mouth once daily (Patient not taking: Reported on 12/28/2022)   No facility-administered encounter medications on file as of 12/28/2022.    Allergies (verified) Patient has no known allergies.   History: Past Medical History:  Diagnosis Date   Anemia    Anginal pain (HCC)    Aortic atherosclerosis (HCC)    Aortic stenosis    a.) s/p AVR on 05/05/2017   Bladder tumor    Cataracts, bilateral    CHF (congestive heart failure) (HCC)    CKD (chronic kidney disease), stage III (HCC)    Coronary artery disease 04/21/2017   a.) LHC 04/21/2017:  90% oRI, 80% o-pLCx, 70% o-pLAD, 60% pLAD, 40% pRCA, 40% m-dRCA, 75% oRPDA-RPDA. 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   a.) LIMA-LAD, SVG-OM, SVG-RI, SVG-PDA   Senile purpura (HCC) 03/13/2018   Skin cancer of face    T2DM (type 2 diabetes mellitus) (HCC) 2009   Past Surgical History:  Procedure Laterality Date   AORTIC VALVE REPLACEMENT N/A 05/05/2017   Procedure: AORTIC VALVE REPLACEMENT (AVR) USING MAGNA EASE PERICARDIAL BIOPROSTHESIS - AORTIC VALVE MODEL 3300TFX, SIZE 23 MM;  Surgeon: Alleen Borne, MD;  Location: [facility] OR;  Service: Open Heart Surgery;  Laterality: N/A;   BLADDER INSTILLATION N/A 08/10/2022   Procedure: BLADDER INSTILLATION OF GEMCITABINE;  Surgeon: Riki Altes, MD;  Location: [facility] ORS;  Service: Urology;  Laterality: N/A;   CATARACT EXTRACTION W/PHACO Right 07/21/2020   Procedure: CATARACT EXTRACTION PHACO AND INTRAOCULAR LENS PLACEMENT (IOC) RIGHT DIABETIC 9.09 00:51.3;  Surgeon: Nevada Crane, MD;  Location[facility];  Service: Ophthalmology;  Laterality: Right;   CATARACT EXTRACTION W/PHACO Left 08/11/2020   Procedure: CATARACT EXTRACTION PHACO AND INTRAOCULAR LENS PLACEMENT (IOC) LEFT DIABETIC;  Surgeon: Nevada Crane, MD;  Location: [facility];  Service: Ophthalmology;  Laterality: Left;  5.08 0:37.2   COLONOSCOPY     CORONARY ARTERY BYPASS GRAFT N/A 05/05/2017   Procedure: CORONARY ARTERY BYPASS GRAFTING (CABG) x 4 WITH ENDOSCOPIC  HARVESTING OF RIGHT SAPHENOUS VEIN (4v; LIMA-LAD, SVG-OM, SVG-RI, SVG-PDA);  Surgeon: Alleen Borne, MD;  Location: [facility] OR;  Service: Open Heart Surgery;  Laterality: N/A;   CYSTOURETHROSCOPY     JOINT REPLACEMENT Left    hip   KNEE ARTHROPLASTY Right 01/12/2016   Procedure: RIGHT  TOTAL KNEE ARTHROPLASTY WITH COMPUTER NAVIGATION;  Surgeon: Samson Frederic, MD;  Location: [facility] OR;  Service: Orthopedics;  Laterality: Right;  Needs RNFA   RIGHT/LEFT HEART CATH AND CORONARY ANGIOGRAPHY N/A 04/21/2017   Procedure: RIGHT/LEFT HEART CATH AND CORONARY ANGIOGRAPHY;  Surgeon: Marcina Millard, MD;  Location: [facility];  Service: Cardiovascular;  Laterality: N/A;   SKIN SURGERY Left    arm   TEE WITHOUT CARDIOVERSION N/A 05/05/2017   Procedure: TRANSESOPHAGEAL ECHOCARDIOGRAM (TEE);  Surgeon:  Alleen Borne, MD;  Location: [facility] OR;  Service: Open Heart Surgery;  Laterality: N/A;   TRANSURETHRAL RESECTION OF BLADDER TUMOR N/A 05/15/2019   Procedure: TRANSURETHRAL RESECTION OF BLADDER TUMOR (TURBT);  Surgeon: Riki Altes, MD;  Location: [facility] ORS;  Service: Urology;  Laterality: N/A;   TRANSURETHRAL RESECTION OF BLADDER TUMOR N/A 09/16/2020   Procedure: TRANSURETHRAL RESECTION OF BLADDER TUMOR (TURBT);  Surgeon: Riki Altes, MD;  Location: [facility] ORS;  Service: Urology;  Laterality: N/A;   TRANSURETHRAL RESECTION OF BLADDER TUMOR N/A 08/10/2022   Procedure: TRANSURETHRAL RESECTION OF BLADDER TUMOR (TURBT);  Surgeon: Riki Altes, MD;  Location: [facility] ORS;  Service: Urology;  Laterality: N/A;   TRANSURETHRAL RESECTION OF BLADDER TUMOR WITH MITOMYCIN-C N/A 03/14/2018   Procedure: TRANSURETHRAL RESECTION OF BLADDER TUMOR WITH Gemcitabine;  Surgeon: Riki Altes, MD;  Location: [facility] ORS;  Service: Urology;  Laterality: N/A;   Family History  Problem Relation Age of Onset   Dementia Mother    Stroke Mother    Stroke Father    Diabetes Father    Heart disease Father    Cancer Sister     Hypertension Brother    Cancer Sister    Social History   Socioeconomic History   Marital status: Married    Spouse name: Paulette   Number of children: Not on file   Years of education: Not on file   Highest education level: 10th grade  Occupational History   Occupation: retired  Tobacco Use   Smoking status: Former    Current packs/day: 0.00    Average packs/day: 0.3 packs/day for 25.0 years (7.5 ttl pk-yrs)    Types: Cigarettes    Start date: 12/16/1955    Quit date: 12/15/1980    Years since quitting: 42.0   Smokeless tobacco: Never  Vaping Use   Vaping status: Never Used  Substance and Sexual Activity   Alcohol use: No    Alcohol/week: 0.0 standard drinks of alcohol   Drug use: No   Sexual activity: Yes  Other Topics Concern   Not on file  Social History Narrative   Not on file   Social Determinants of Health   Financial Resource Strain: Low Risk  (12/28/2022)   Overall Financial Resource Strain (CARDIA)    Difficulty of Paying Living Expenses: Not hard at all  Food Insecurity: No Food Insecurity (12/28/2022)   Hunger Vital Sign    Worried About Running Out of Food in the Last Year: Never true    Ran Out of Food in the Last Year: Never true  Transportation Needs: No Transportation Needs (12/28/2022)   PRAPARE - Administrator, Civil Service (Medical): No    Lack of Transportation (Non-Medical): No  Physical Activity: Insufficiently Active (12/28/2022)   Exercise Vital Sign    Days of Exercise per Week: 3 days    Minutes of Exercise per Session: 10 min  Stress: No Stress Concern Present (12/28/2022)   [employer] of Occupational Health - Occupational Stress Questionnaire    Feeling of Stress : Not at all  Social Connections: Moderately Integrated (12/28/2022)   Social Connection and Isolation Panel [NHANES]    Frequency of Communication with Friends and Family: More than three times a week    Frequency of Social Gatherings with Friends  and Family: More than three times a week    Attends Religious Services: More than 4 times per year    Active Member of Clubs or Organizations: No  Attends Banker Meetings: Never    Marital Status: Married    Tobacco Counseling Counseling given: Not Answered   Clinical Intake:  Pre-visit preparation completed: Yes  Pain : No/denies pain     BMI - recorded: 27.53 Nutritional Status: BMI 25 -29 Overweight Nutritional Risks: None Diabetes: Yes CBG done?: No Did pt. bring in CBG monitor from home?: No  How often do you need to have someone help you when you read instructions, pamphlets, or other written materials from your doctor or pharmacy?: 1 - Never  Interpreter Needed?: No  Information entered by :: Tora Kindred, CMA   Activities of Daily Living    12/28/2022    8:18 AM 08/02/2022   12:44 PM  In your present state of health, do you have any difficulty performing the following activities:  Hearing? 0   Vision? 0   Difficulty concentrating or making decisions? 1   Walking or climbing stairs? 1   Dressing or bathing? 0   Doing errands, shopping? 0 0  Preparing Food and eating ? N   Using the Toilet? N   In the past six months, have you accidently leaked urine? Y   Comment bladder cancer   Do you have problems with loss of bowel control? N   Managing your Medications? N   Managing your Finances? N   Housekeeping or managing your Housekeeping? N     Patient Care Team: Marjie Skiff, NP as PCP - General (Nurse Practitioner) Samson Frederic, MD as Consulting Physician (Orthopedic Surgery) Marcina Millard, MD as Consulting Physician (Cardiology) Alleen Borne, MD as Consulting Physician (Cardiothoracic Surgery) Jesusita Oka, MD (Dermatology) Minor, Theadora Rama, RN (Inactive) as [facility] Care Management  Indicate any recent Medical Services you may have received from other than [facility] providers in the past year (date may  be approximate).     Assessment:   This is a routine wellness examination for [place].  Hearing/Vision screen Hearing Screening - Comments:: Denies hearing loss Vision Screening - Comments:: Gets eye exams   Goals Addressed               This Visit's Progress     Patient Stated (pt-stated)        Continue to exercise, so I can keep up my strength and walk better.      Depression Screen    12/28/2022    8:25 AM 10/25/2022    8:52 AM 04/26/2022    8:40 AM 12/03/2021   12:41 PM 12/01/2020    1:07 PM 03/12/2020    8:24 AM 11/26/2019   10:35 AM  PHQ 2/9 Scores  PHQ - 2 Score 0 0 0 0 0 0 0  PHQ- 9 Score 0 0 3 2  0     Fall Risk    12/28/2022    8:28 AM 12/03/2021   12:35 PM 12/01/2020    1:06 PM 03/12/2020    8:23 AM 11/26/2019   10:34 AM  Fall Risk   Falls in the past year? 1 0 1 0 0  Comment   fell in bathtub    Number falls in past yr: 0 0 0    Injury with Fall? 1 0 1    Risk for fall due to : History of fall(s);Impaired balance/gait;Orthopedic patient;Impaired mobility  Medication side effect;Impaired mobility  Impaired balance/gait;Medication side effect  Follow up Education provided;Falls prevention discussed;Falls evaluation completed Falls evaluation completed;Education provided;Falls prevention discussed Falls evaluation completed;Education provided;Falls prevention  discussed  Falls evaluation completed;Education provided;Falls prevention discussed    MEDICARE RISK AT HOME: Medicare Risk at Home Any stairs in or around the home?: No If so, are there any without handrails?: No Home free of loose throw rugs in walkways, pet beds, electrical cords, etc?: Yes Adequate lighting in your home to reduce risk of falls?: Yes Life alert?: No Use of a cane, walker or w/c?: No Grab bars in the bathroom?: No Shower chair or bench in shower?: No Elevated toilet seat or a handicapped toilet?: Yes  TIMED UP AND GO:  Was the test performed?  No    Cognitive Function:         12/28/2022    8:30 AM 12/03/2021   12:37 PM 12/01/2020    1:09 PM 11/26/2019   10:38 AM 02/28/2019   10:42 AM  6CIT Screen  What Year? 0 points 0 points 0 points 0 points 0 points  What month? 0 points 0 points 0 points 0 points 0 points  What time? 0 points 0 points 0 points 0 points 0 points  Count back from 20 0 points 0 points 0 points 0 points 0 points  Months in reverse 0 points 0 points 0 points 0 points 0 points  Repeat phrase 0 points 0 points 0 points 0 points 0 points  Total Score 0 points 0 points 0 points 0 points 0 points    Immunizations Immunization History  Administered Date(s) Administered   Fluad Quad(high Dose 65+) 12/13/2018, 12/10/2019, 12/15/2020   Influenza, High Dose Seasonal PF 11/25/2015, 11/03/2016, 11/03/2017, 11/17/2021   Influenza,inj,Quad PF,6+ Mos 11/18/2014   PFIZER(Purple Top)SARS-COV-2 Vaccination 03/12/2019, 04/02/2019, 12/20/2019, 07/18/2020   Pneumococcal Conjugate-13 11/23/2013   Pneumococcal Polysaccharide-23 05/24/2014   Td 04/26/2022   Zoster, Live 05/19/1957    TDAP status: Up to date  Flu Vaccine status: Due, Education has been provided regarding the importance of this vaccine. Advised may receive this vaccine at local pharmacy or Health Dept. Aware to provide a copy of the vaccination record if obtained from local pharmacy or Health Dept. Verbalized acceptance and understanding.  Pneumococcal vaccine status: Up to date  Covid-19 vaccine status: Information provided on how to obtain vaccines.   Qualifies for Shingles Vaccine? Yes   Zostavax completed No   Shingrix Completed?: No.    Education has been provided regarding the importance of this vaccine. Patient has been advised to call insurance company to determine out of pocket expense if they have not yet received this vaccine. Advised may also receive vaccine at local pharmacy or Health Dept. Verbalized acceptance and understanding.  Screening Tests Health Maintenance  Topic  Date Due   OPHTHALMOLOGY EXAM  09/08/2022   INFLUENZA VACCINE  09/30/2022   COVID-19 Vaccine (5 - 2023-24 season) 10/31/2022   Zoster Vaccines- Shingrix (1 of 2) 01/23/2023 (Originally 03/25/1959)   Diabetic kidney evaluation - Urine ACR  04/27/2023   FOOT EXAM  04/27/2023   HEMOGLOBIN A1C  04/27/2023   Diabetic kidney evaluation - eGFR measurement  10/25/2023   Medicare Annual Wellness (AWV)  12/28/2023   DTaP/Tdap/Td (2 - Tdap) 04/26/2032   Pneumonia Vaccine 50+ Years old  Completed   HPV VACCINES  Aged Out   Colonoscopy  Discontinued    Health Maintenance  Health Maintenance Due  Topic Date Due   OPHTHALMOLOGY EXAM  09/08/2022   INFLUENZA VACCINE  09/30/2022   COVID-19 Vaccine (5 - 2023-24 season) 10/31/2022    Colorectal cancer screening: No longer required.  Lung Cancer Screening: (Low Dose CT Chest recommended if Age 66-80 years, 20 pack-year currently smoking OR have quit w/in 15years.) does not qualify.   Lung Cancer Screening Referral: n/a  Additional Screening:  Hepatitis C Screening: does not qualify;   Vision Screening: Recommended annual ophthalmology exams for early detection of glaucoma and other disorders of the eye. Is the patient up to date with their annual eye exam?  No  Who is the provider or what is the name of the office in which the patient attends annual eye exams? [facility] If pt is not established with a provider, would they like to be referred to a provider to establish care? No .   Dental Screening: Recommended annual dental exams for proper oral hygiene  Diabetic Foot Exam: Diabetic Foot Exam: Completed 04/26/22  Community Resource Referral / Chronic Care Management: CRR required this visit?  No   CCM required this visit?  No     Plan:     I have personally reviewed and noted the following in the patient's chart:   Medical and social history Use of alcohol, tobacco or illicit drugs  Current medications and supplements  including opioid prescriptions. Patient is not currently taking opioid prescriptions. Functional ability and status Nutritional status Physical activity Advanced directives List of other physicians Hospitalizations, surgeries, and ER visits in previous 12 months Vitals Screenings to include cognitive, depression, and falls Referrals and appointments  In addition, I have reviewed and discussed with patient certain preventive protocols, quality metrics, and best practice recommendations. A written personalized care plan for preventive services as well as general preventive health recommendations were provided to patient.     Tora Kindred, CMA   12/28/2022   After Visit Summary: (MyChart) Due to this being a telephonic visit, the after visit summary with patients personalized plan was offered to patient via MyChart   Nurse Notes:  Needs Diabetic eye exam Plans to get flu, covid and RSV vaccines. Declined shingles vaccine

## 2023-01-12 ENCOUNTER — Ambulatory Visit: Payer: PPO | Admitting: Urology

## 2023-01-12 ENCOUNTER — Encounter: Payer: Self-pay | Admitting: Urology

## 2023-01-12 VITALS — BP 168/73 | HR 76 | Ht 72.0 in | Wt 203.0 lb

## 2023-01-12 DIAGNOSIS — Z8551 Personal history of malignant neoplasm of bladder: Secondary | ICD-10-CM

## 2023-01-12 DIAGNOSIS — C679 Malignant neoplasm of bladder, unspecified: Secondary | ICD-10-CM | POA: Diagnosis not present

## 2023-01-12 LAB — URINALYSIS, COMPLETE
Bilirubin, UA: NEGATIVE
Glucose, UA: NEGATIVE
Ketones, UA: NEGATIVE
Leukocytes,UA: NEGATIVE
Nitrite, UA: NEGATIVE
Protein,UA: NEGATIVE
RBC, UA: NEGATIVE
Specific Gravity, UA: 1.015 (ref 1.005–1.030)
Urobilinogen, Ur: 0.2 mg/dL (ref 0.2–1.0)
pH, UA: 6 (ref 5.0–7.5)

## 2023-01-12 LAB — MICROSCOPIC EXAMINATION: Epithelial Cells (non renal): >10 /[HPF] — AB (ref 0–10)

## 2023-01-12 MED ORDER — CIPROFLOXACIN HCL 500 MG PO TABS
500.0000 mg | ORAL_TABLET | Freq: Once | ORAL | Status: AC
Start: 2023-01-12 — End: 2023-01-12
  Administered 2023-01-12: 500 mg via ORAL

## 2023-01-12 NOTE — Progress Notes (Signed)
    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 male presents for 4 month surveillance cystoscopy  Refer to rooming tab for vitals  Cystoscopy Procedure Note  Patient identification was confirmed, informed consent was obtained, and patient was prepped using Betadine solution.  Lidocaine jelly was administered per urethral meatus.     Pre-Procedure: - Inspection reveals a normal caliber urethral meatus.  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

## 2023-01-19 DIAGNOSIS — H35363 Drusen (degenerative) of macula, bilateral: Secondary | ICD-10-CM | POA: Diagnosis not present

## 2023-01-19 DIAGNOSIS — H5203 Hypermetropia, bilateral: Secondary | ICD-10-CM | POA: Diagnosis not present

## 2023-01-19 DIAGNOSIS — E119 Type 2 diabetes mellitus without complications: Secondary | ICD-10-CM | POA: Diagnosis not present

## 2023-01-19 DIAGNOSIS — H5055 Alternating heterophoria: Secondary | ICD-10-CM | POA: Diagnosis not present

## 2023-01-19 LAB — HM DIABETES EYE EXAM

## 2023-01-23 NOTE — Patient Instructions (Signed)
Be Involved in Caring For Your Health:  Taking Medications When medications are taken as directed, they can greatly improve your health. But if they are not taken as prescribed, they may not work. In some cases, not taking them correctly can be harmful. To help ensure your treatment remains effective and safe, understand your medications and how to take them. Bring your medications to each visit for review by your provider.  Your lab results, notes, and after visit summary will be available on My Chart. We strongly encourage you to use this feature. If lab results are abnormal the clinic will contact you with the appropriate steps. If the clinic does not contact you assume the results are satisfactory. You can always view your results on My Chart. If you have questions regarding your health or results, please contact the clinic during office hours. You can also ask questions on My Chart.  We at [facility] are grateful that you chose [place] to provide your care. We strive to provide evidence-based and compassionate care and are always looking for feedback. If you get a survey from the clinic please complete this so we can hear your opinions.  Diabetes Mellitus and Exercise Regular exercise is important for your health, especially if you have diabetes mellitus. Exercise is not just about losing weight. It can also help you increase muscle strength and bone density and reduce body fat and stress. This can help your level of endurance and make you more fit and flexible. Why should I exercise if I have diabetes? Exercise has many benefits for people with diabetes. It can: Help lower and control your blood sugar (glucose). Help your body respond better and become more sensitive to the hormone insulin. Reduce how much insulin your body needs. Lower your risk for heart disease by: Lowering how much "bad" cholesterol and triglycerides you have in your body. Increasing how much "good" cholesterol  you have in your body. Lowering your blood pressure. Lowering your blood glucose levels. What is my activity plan? Your health care provider or an expert trained in diabetes care (certified diabetes educator) can help you make an activity plan. This plan can help you find the type of exercise that works for you. It may also tell you how often to exercise and for how long. Be sure to: Get at least 150 minutes of medium-intensity or high-intensity exercise each week. This may involve brisk walking, biking, or water aerobics. Do stretching and strengthening exercises at least 2 times a week. This may involve yoga or weight lifting. Spread out your activity over at least 3 days of the week. Get some form of physical activity each day. Do not go more than 2 days in a row without some kind of activity. Avoid being inactive for more than 30 minutes at a time. Take frequent breaks to walk or stretch. Choose activities that you enjoy. Set goals that you know you can accomplish. Start slowly and increase the intensity of your exercise over time. How do I manage my diabetes during exercise?  Monitor your blood glucose Check your blood glucose before and after you exercise. If your blood glucose is 240 mg/dL (40.9 mmol/L) or higher before you exercise, check your urine for ketones. These are chemicals created by the liver. If you have ketones in your urine, do not exercise until your blood glucose returns to normal. If your blood glucose is 100 mg/dL (5.6 mmol/L) or lower, eat a snack that has 15-20 grams of carbohydrate in  it. Check your blood glucose 15 minutes after the snack to make sure that your level is above 100 mg/dL (5.6 mmol/L) before you start to exercise. Your risk for low blood glucose (hypoglycemia) goes up during and after exercise. Know the symptoms of this condition and how to treat it. Follow these instructions at home: Keep a carbohydrate snack on hand for use before, during, and after  exercise. This can help prevent or treat hypoglycemia. Avoid injecting insulin into parts of your body that are going to be used during exercise. This may include: Your arms, when you are going to play tennis. Your legs, when you are about to go jogging. Keep track of your exercise habits. This can help you and your health care provider watch and adjust your activity plan. Write down: What you eat before and after you exercise. Blood glucose levels before and after you exercise. The type and amount of exercise you do. Talk to your health care provider before you start a new activity. They may need to: Make sure that the activity is safe for you. Adjust your insulin, other medicines, and food that you eat. Drink water while you exercise. This can stop you from losing too much water (dehydration). It can also prevent problems caused by having a lot of heat in your body (heat stroke). Where to find more information American Diabetes Association: diabetes.org Association of Diabetes Care & Education Specialists: diabeteseducator.org This information is not intended to replace advice given to you by your health care provider. Make sure you discuss any questions you have with your health care provider. Document Revised: 08/05/2021 Document Reviewed: 08/05/2021 Elsevier Patient Education  2024 [employer].

## 2023-01-26 ENCOUNTER — Ambulatory Visit (INDEPENDENT_AMBULATORY_CARE_PROVIDER_SITE_OTHER): Payer: PPO | Admitting: Nurse Practitioner

## 2023-01-26 ENCOUNTER — Encounter: Payer: Self-pay | Admitting: Nurse Practitioner

## 2023-01-26 VITALS — BP 124/72 | HR 56 | Temp 97.6°F | Wt 203.2 lb

## 2023-01-26 DIAGNOSIS — R809 Proteinuria, unspecified: Secondary | ICD-10-CM | POA: Diagnosis not present

## 2023-01-26 DIAGNOSIS — I152 Hypertension secondary to endocrine disorders: Secondary | ICD-10-CM | POA: Diagnosis not present

## 2023-01-26 DIAGNOSIS — E1129 Type 2 diabetes mellitus with other diabetic kidney complication: Secondary | ICD-10-CM | POA: Diagnosis not present

## 2023-01-26 DIAGNOSIS — Z23 Encounter for immunization: Secondary | ICD-10-CM

## 2023-01-26 DIAGNOSIS — D692 Other nonthrombocytopenic purpura: Secondary | ICD-10-CM | POA: Diagnosis not present

## 2023-01-26 DIAGNOSIS — I350 Nonrheumatic aortic (valve) stenosis: Secondary | ICD-10-CM

## 2023-01-26 DIAGNOSIS — E1159 Type 2 diabetes mellitus with other circulatory complications: Secondary | ICD-10-CM

## 2023-01-26 DIAGNOSIS — I5022 Chronic systolic (congestive) heart failure: Secondary | ICD-10-CM | POA: Diagnosis not present

## 2023-01-26 DIAGNOSIS — D508 Other iron deficiency anemias: Secondary | ICD-10-CM

## 2023-01-26 DIAGNOSIS — E1169 Type 2 diabetes mellitus with other specified complication: Secondary | ICD-10-CM | POA: Diagnosis not present

## 2023-01-26 DIAGNOSIS — C679 Malignant neoplasm of bladder, unspecified: Secondary | ICD-10-CM

## 2023-01-26 DIAGNOSIS — E785 Hyperlipidemia, unspecified: Secondary | ICD-10-CM | POA: Diagnosis not present

## 2023-01-26 DIAGNOSIS — R7989 Other specified abnormal findings of blood chemistry: Secondary | ICD-10-CM | POA: Diagnosis not present

## 2023-01-26 LAB — BAYER DCA HB A1C WAIVED: HB A1C (BAYER DCA - WAIVED): 6.8 % — ABNORMAL HIGH (ref 4.8–5.6)

## 2023-01-26 NOTE — Assessment & Plan Note (Signed)
Chronic, ongoing.  Continue current medication regimen and adjust as needed. Lipid panel today. 

## 2023-01-26 NOTE — Assessment & Plan Note (Signed)
Chronic, ongoing with A1c 6.8% today, stable for age.  Urine ALB 30 (February 2024).   Losartan for kidney protection.  Continue current medication regimen and adjust as needed.   Avoid hypoglycemia due to advanced age.  Will refer him to nephrology if worsening kidney function presents. Return in 3 months.  - ARB and Statin on board - Vaccinations up to date - Needs eye exam and recommend he schedule. - Foot exam up to date.

## 2023-01-26 NOTE — Assessment & Plan Note (Signed)
Ongoing.  As evidenced by scattered bruises bilaterall upper extremities.  Gentle skin cleansers at home and lotion.  Monitor for skin breakdown, report to provider if present.

## 2023-01-26 NOTE — Progress Notes (Signed)
BP 124/72 (BP Location: Left Arm, Patient Position: Sitting)   Pulse (!) 56   Temp 97.6 F (36.4 C) (Oral)   Wt 203 lb 3.2 oz (92.2 kg)   SpO2 98%   BMI 27.56 kg/m    Subjective:    Patient ID: John [name], male    DOB: 1940-10-12, 82 y.o.   MRN: [id number]  HPI: HAPPY COVIN is a 82 y.o. male  Chief Complaint  Patient presents with   Diabetes   Hypertension   Hyperlipidemia   DIABETES Continues to take Janumet XR 50-1000 MG 1 tablet BID.  A1c in August was 6.8%, remaining stable. Hypoglycemic episodes:no Polydipsia/polyuria: no Visual disturbance: no Chest pain: no Paresthesias: no Glucose Monitoring: no             Accucheck frequency: Not Checking             Fasting glucose:             Post prandial:             Evening:             Before meals: Taking Insulin?: no             Long acting insulin:             Short acting insulin: Blood Pressure Monitoring: rarely Retinal Examination: Up To Date -- [place] Foot Exam: Up to Date Pneumovax: Up to Date Influenza: Up to Date Aspirin: yes   HYPERTENSION / HYPERLIPIDEMIA/HF Taking Lasix 20 MG daily, Metoprolol 25 MG BID + Losartan 12.5 MG + Lipitor 40 MG, ASA.  Moderate underlying aortic stenosis noted on past imaging.  Last cardiology visit was 11/15/22 with no changes made. History of elevation of TSH on labs, no symptoms present and antibody normal. Last echo 11/30/22 -- mild LV dysfunction with LVH, EF 50%.  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  He reports feet swell at times and occasional tightness across the chest. Satisfied with current treatment? yes Duration of hypertension: chronic BP monitoring frequency: not checking BP range:  BP medication side effects: no Duration of hyperlipidemia: chronic Cholesterol medication side effects: no Cholesterol supplements: none Medication compliance: good compliance Aspirin: yes Recent stressors: no Recurrent headaches: no Visual changes:  no Palpitations: no Dyspnea: no and no orthopnea Chest pain: no, but occasional tightness with activity Lower extremity edema: yes, at baseline  Dizzy/lightheaded: no  ANEMIA Taking iron and B12 at home.   Anemia status: stable Etiology of anemia: ?surgery Duration of anemia treatment: months Compliance with treatment: good compliance Iron supplementation side effects: no Severity of anemia: mild Fatigue: no Decreased exercise tolerance: no  Dyspnea on exertion: no Palpitations: no Bleeding: no Pica: no   BLADDER CANCER: Followed by urology.  Saw Dr. Lonna Cobb last on 01/12/23 for cystoscopy. Each time he has these procedures it takes longer to recover per his report (2-3 weeks to recover), so he is pondering whether to continue doing these.  He would prefer not to have anymore chemo.  States he is still having weakness with walking and feeling more fatigued, offered PT but he declines.      01/26/2023    8:13 AM 12/28/2022    8:25 AM 10/25/2022    8:52 AM 04/26/2022    8:40 AM 12/03/2021   12:41 PM  Depression screen PHQ 2/9  Decreased Interest 1 0 0 0 0  Down, Depressed, Hopeless 0 0 0 0 0  PHQ - 2 Score 1 0 0 0 0  Altered sleeping 1 0 0 3 2  Tired, decreased energy 1 0 0 0 0  Change in appetite 0 0 0 0 0  Feeling bad or failure about yourself  0 0 0 0 0  Trouble concentrating 0 0 0 0 0  Moving slowly or fidgety/restless 1 0 0 0 0  Suicidal thoughts 0 0 0 0 0  PHQ-9 Score 4 0 0 3 2  Difficult doing work/chores Not difficult at all Not difficult at all Not difficult at all Not difficult at all Not difficult at all       01/26/2023    8:13 AM 10/25/2022    8:52 AM 04/26/2022    8:40 AM  GAD 7 : Generalized Anxiety Score  Nervous, Anxious, on Edge 0 0 0  Control/stop worrying 0 0 0  Worry too much - different things 0 0 0  Trouble relaxing 0 0 0  Restless 0 0 0  Easily annoyed or irritable 0 0 0  Afraid - awful might happen 0 0 0  Total GAD 7 Score 0 0 0  Anxiety  Difficulty Not difficult at all Not difficult at all Not difficult at all   Relevant past medical, surgical, family and social history reviewed and updated as indicated. Interim medical history since our last visit reviewed. Allergies and medications reviewed and updated.  Review of Systems  Constitutional:  Negative for activity change, diaphoresis, fatigue and fever.  Respiratory:  Negative for cough, chest tightness, shortness of breath and wheezing.   Cardiovascular:  Negative for chest pain, palpitations and leg swelling.  Gastrointestinal: Negative.   Endocrine: Negative for cold intolerance, heat intolerance, polydipsia, polyphagia and polyuria.  Neurological: Negative.   Psychiatric/Behavioral: Negative.     Per HPI unless specifically indicated above     Objective:    BP 124/72 (BP Location: Left Arm, Patient Position: Sitting)   Pulse (!) 56   Temp 97.6 F (36.4 C) (Oral)   Wt 203 lb 3.2 oz (92.2 kg)   SpO2 98%   BMI 27.56 kg/m   Wt Readings from Last 3 Encounters:  01/26/23 203 lb 3.2 oz (92.2 kg)  01/12/23 203 lb (92.1 kg)  12/28/22 203 lb (92.1 kg)    Physical Exam Vitals and nursing note reviewed.  Constitutional:      General: He is awake. He is not in acute distress.    Appearance: He is well-developed and well-groomed. He is not ill-appearing or toxic-appearing.  HENT:     Head: Normocephalic.     Right Ear: Hearing and external ear normal.     Left Ear: Hearing and external ear normal.  Eyes:     General: Lids are normal.     Extraocular Movements: Extraocular movements intact.     Conjunctiva/sclera: Conjunctivae normal.  Neck:     Thyroid: No thyromegaly.     Vascular: No carotid bruit.  Cardiovascular:     Rate and Rhythm: Regular rhythm. Bradycardia present.     Heart sounds: Murmur heard.     Systolic murmur is present with a grade of 2/6.     No gallop.  Pulmonary:     Effort: No accessory muscle usage or respiratory distress.     Breath  sounds: Normal breath sounds.  Abdominal:     General: Bowel sounds are normal. There is no distension.     Palpations: Abdomen is soft.     Tenderness: There is  no abdominal tenderness.  Musculoskeletal:     Cervical back: Full passive range of motion without pain.     Right lower leg: No edema.     Left lower leg: No edema.  Lymphadenopathy:     Cervical: No cervical adenopathy.  Skin:    General: Skin is warm.     Capillary Refill: Capillary refill takes less than 2 seconds.  Neurological:     Mental Status: He is alert and oriented to person, place, and time.     Deep Tendon Reflexes: Reflexes are normal and symmetric.     Reflex Scores:      Brachioradialis reflexes are 2+ on the right side and 2+ on the left side.      Patellar reflexes are 2+ on the right side and 2+ on the left side. Psychiatric:        Attention and Perception: Attention normal.        Mood and Affect: Mood normal.        Speech: Speech normal.        Behavior: Behavior normal. Behavior is cooperative.        Thought Content: Thought content normal.    Results for orders placed or performed in visit on 01/19/23  HM DIABETES EYE EXAM  Result Value Ref Range   HM Diabetic Eye Exam No Retinopathy No Retinopathy      Assessment & Plan:   Problem List Items Addressed This Visit       Cardiovascular and Mediastinum   Aortic valve stenosis, moderate   Chronic systolic heart failure (HCC)    Chronic, ongoing.  Euvolemic today with baseline edema to BLE.  Continue collaboration with cardiology and current medication regimen.  Recommend: - Reminded to call for an overnight weight gain of >2 pounds or a weekly weight weight of >5 pounds - not adding salt to his food and has been reading food labels. Reviewed the importance of keeping daily sodium intake to 2000mg  daily  - Avoid NSAIDs       Hypertension associated with type 2 diabetes mellitus (HCC)    Chronic, stable.  BP at goal for age.  Avoid  hypotension. Continue current medication regimen and adjust as needed.  Urine ALB 30 (February 2024).  Losartan for kidney protection.  Refer to nephrology if worsening kidney function.  Recommend he monitor his BP regularly at home and follow DASH diet.  Continue to collaborate with cardiology.  LABS: CMP.  Return in 3 months.      Relevant Orders   Bayer DCA Hb A1c Waived   Senile purpura (HCC)    Ongoing.  As evidenced by scattered bruises bilaterall upper extremities.  Gentle skin cleansers at home and lotion.  Monitor for skin breakdown, report to provider if present.        Endocrine   Hyperlipidemia associated with type 2 diabetes mellitus (HCC)    Chronic, ongoing.  Continue current medication regimen and adjust as needed.  Lipid panel today.      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  Urine ALB 30 (February 2024).   Losartan for kidney protection.  Continue current medication regimen and adjust as needed.   Avoid hypoglycemia due to advanced age.  Will refer him to nephrology if worsening  kidney function presents. Return in 3 months.  - ARB and Statin on board - Vaccinations up to date - Needs eye exam and recommend he schedule. - Foot exam up to date.      Relevant Orders   Bayer DCA Hb A1c Waived     Genitourinary   Bladder cancer (HCC)    Chronic.  Continue to collaborate with urology, at length discussion on goals of care will continue at visits.        Other   Elevated TSH    Ongoing and fluctuating.  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  Thyroid ultrasound was reassuring.  Recent labs showed mild elevation in TSH.   He denies symptoms.      Relevant Orders   T4, free   TSH   Iron deficiency anemia    Ongoing, stable.  Continue daily iron.  Recheck iron and CBC today.  If  ongoing stability will stop iron supplement.      Relevant Orders   CBC with Differential/Platelet   Ferritin   Iron     Follow up plan: Return in about 3 months (around 04/28/2023) for T2DM, HTN/HLD, ANEMIA.

## 2023-01-26 NOTE — Assessment & Plan Note (Signed)
Ongoing, stable.  Continue daily iron.  Recheck iron and CBC today.  If ongoing stability will stop iron supplement.

## 2023-01-26 NOTE — Assessment & Plan Note (Signed)
Ongoing and fluctuating.  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  Thyroid ultrasound was reassuring.  Recent labs showed mild elevation in TSH.   He denies symptoms.

## 2023-01-26 NOTE — Assessment & Plan Note (Signed)
Chronic, stable.  BP at goal for age.  Avoid hypotension. Continue current medication regimen and adjust as needed.  Urine ALB 30 (February 2024).  Losartan for kidney protection.  Refer to nephrology if worsening kidney function.  Recommend he monitor his BP regularly at home and follow DASH diet.  Continue to collaborate with cardiology.  LABS: CMP.  Return in 3 months.

## 2023-01-26 NOTE — Assessment & Plan Note (Addendum)
Chronic, ongoing.  Euvolemic today with baseline edema to BLE.  Continue collaboration with cardiology and current medication regimen.  Recommend: - Reminded to call for an overnight weight gain of >2 pounds or a weekly weight weight of >5 pounds - not adding salt to his food and has been reading food labels. Reviewed the importance of keeping daily sodium intake to 2000mg  daily  - Avoid NSAIDs

## 2023-01-26 NOTE — Assessment & Plan Note (Signed)
Chronic.  Continue to collaborate with urology, at length discussion on goals of care will continue at visits.

## 2023-01-27 LAB — CBC WITH DIFFERENTIAL/PLATELET
Basophils Absolute: 0 10*3/uL (ref 0.0–0.2)
Basos: 0 %
EOS (ABSOLUTE): 0.4 10*3/uL (ref 0.0–0.4)
Eos: 7 %
Hematocrit: 37.9 % (ref 37.5–51.0)
Hemoglobin: 12 g/dL — ABNORMAL LOW (ref 13.0–17.7)
Immature Grans (Abs): 0 10*3/uL (ref 0.0–0.1)
Immature Granulocytes: 0 %
Lymphocytes Absolute: 1.3 10*3/uL (ref 0.7–3.1)
Lymphs: 23 %
MCH: 28.5 pg (ref 26.6–33.0)
MCHC: 31.7 g/dL (ref 31.5–35.7)
MCV: 90 fL (ref 79–97)
Monocytes Absolute: 0.5 10*3/uL (ref 0.1–0.9)
Monocytes: 8 %
Neutrophils Absolute: 3.5 10*3/uL (ref 1.4–7.0)
Neutrophils: 62 %
Platelets: 197 10*3/uL (ref 150–450)
RBC: 4.21 x10E6/uL (ref 4.14–5.80)
RDW: 12.2 % (ref 11.6–15.4)
WBC: 5.7 10*3/uL (ref 3.4–10.8)

## 2023-01-27 LAB — LIPID PANEL W/O CHOL/HDL RATIO
Cholesterol, Total: 104 mg/dL (ref 100–199)
HDL: 46 mg/dL (ref >39)
LDL Chol Calc (NIH): 41 mg/dL (ref 0–99)
Triglycerides: 88 mg/dL (ref 0–149)
VLDL Cholesterol Cal: 17 mg/dL (ref 5–40)

## 2023-01-27 LAB — COMPREHENSIVE METABOLIC PANEL
ALT: 20 [IU]/L (ref 0–44)
AST: 23 [IU]/L (ref 0–40)
Albumin: 4 g/dL (ref 3.7–4.7)
Alkaline Phosphatase: 74 [IU]/L (ref 44–121)
BUN/Creatinine Ratio: 21 (ref 10–24)
BUN: 23 mg/dL (ref 8–27)
Bilirubin Total: 0.4 mg/dL (ref 0.0–1.2)
CO2: 27 mmol/L (ref 20–29)
Calcium: 8.9 mg/dL (ref 8.6–10.2)
Chloride: 103 mmol/L (ref 96–106)
Creatinine, Ser: 1.1 mg/dL (ref 0.76–1.27)
Globulin, Total: 2.7 g/dL (ref 1.5–4.5)
Glucose: 129 mg/dL — ABNORMAL HIGH (ref 70–99)
Potassium: 4.2 mmol/L (ref 3.5–5.2)
Sodium: 143 mmol/L (ref 134–144)
Total Protein: 6.7 g/dL (ref 6.0–8.5)
eGFR: 66 mL/min/{1.73_m2} (ref >59)

## 2023-01-27 LAB — TSH: TSH: 4.73 u[IU]/mL — ABNORMAL HIGH (ref 0.450–4.500)

## 2023-01-27 LAB — IRON: Iron: 81 ug/dL (ref 38–169)

## 2023-01-27 LAB — T4, FREE: Free T4: 1.15 ng/dL (ref 0.82–1.77)

## 2023-01-27 LAB — FERRITIN: Ferritin: 187 ng/mL (ref 30–400)

## 2023-01-29 NOTE — Progress Notes (Signed)
Good morning, please let Tykeem know labs have returned and are overall very stable with exception of ongoing mild elevation in thyroid lab (TSH), however since you are no symptomatic and ultrasound was stable we can continue to monitor without starting medication.  Any questions? Keep being awesome!!  Thank you for allowing me to participate in your care.  I appreciate you. Kindest regards, Haitham Dolinsky

## 2023-03-08 ENCOUNTER — Other Ambulatory Visit: Payer: Self-pay | Admitting: Nurse Practitioner

## 2023-03-10 NOTE — Telephone Encounter (Signed)
 Requested Prescriptions  Pending Prescriptions Disp Refills   atorvastatin  (LIPITOR) 40 MG tablet [Pharmacy Med Name: Atorvastatin  Calcium  40 MG Oral Tablet] 90 tablet 0    Sig: TAKE 1 TABLET BY MOUTH ONCE DAILY AT  6  PM     Cardiovascular:  Antilipid - Statins Failed - 03/10/2023  1:06 PM      Failed - Lipid Panel in normal range within the last 12 months    Cholesterol, Total  Date Value Ref Range Status  01/26/2023 104 100 - 199 mg/dL Final   Cholesterol Piccolo, Waived  Date Value Ref Range Status  05/19/2015 192 <200 mg/dL Final    Comment:                            Desirable                <200                         Borderline High      200- 239                         High                     >239    LDL Chol Calc (NIH)  Date Value Ref Range Status  01/26/2023 41 0 - 99 mg/dL Final   HDL  Date Value Ref Range Status  01/26/2023 46 >39 mg/dL Final   Triglycerides  Date Value Ref Range Status  01/26/2023 88 0 - 149 mg/dL Final   Triglycerides Piccolo,Waived  Date Value Ref Range Status  05/19/2015 106 <150 mg/dL Final    Comment:                            Normal                   <150                         Borderline High     150 - 199                         High                200 - 499                         Very High                >499          Passed - Patient is not pregnant      Passed - Valid encounter within last 12 months    Recent Outpatient Visits           1 month ago Type 2 diabetes mellitus with proteinuria (HCC)   [place] [facility] [place], [place] T, NP   3 months ago Elevated TSH   [place] [facility] [place], Staples T, NP   4 months ago Type 2 diabetes mellitus with proteinuria (HCC)   [place] [facility] [place], N[place] T, NP   10 months ago Type 2 diabetes mellitus with proteinuria (HCC)   [place] [facility]  Cannady, Jolene T, NP   1 year ago Type 2  diabetes mellitus with stage 3a chronic kidney disease, without long-term current use of insulin  (HCC)   [place] [facility] [place], Melanie DASEN, NP       Future Appointments             In 1 month Cannady, Melanie DASEN, NP [place] [facility]

## 2023-04-24 DIAGNOSIS — E119 Type 2 diabetes mellitus without complications: Secondary | ICD-10-CM | POA: Insufficient documentation

## 2023-04-24 NOTE — Patient Instructions (Signed)
 Be Involved in Caring For Your Health:  Taking Medications When medications are taken as directed, they can greatly improve your health. But if they are not taken as prescribed, they may not work. In some cases, not taking them correctly can be harmful. To help ensure your treatment remains effective and safe, understand your medications and how to take them. Bring your medications to each visit for review by your provider.  Your lab results, notes, and after visit summary will be available on My Chart. We strongly encourage you to use this feature. If lab results are abnormal the clinic will contact you with the appropriate steps. If the clinic does not contact you assume the results are satisfactory. You can always view your results on My Chart. If you have questions regarding your health or results, please contact the clinic during office hours. You can also ask questions on My Chart.  We at [facility] are grateful that you chose [place] to provide your care. We strive to provide evidence-based and compassionate care and are always looking for feedback. If you get a survey from the clinic please complete this so we can hear your opinions.  Diabetes Mellitus and Foot Care Diabetes, also called diabetes mellitus, may cause problems with your feet and legs because of poor blood flow (circulation). Poor circulation may make your skin: Become thinner and drier. Break more easily. Heal more slowly. Peel and crack. You may also have nerve damage (neuropathy). This can cause decreased feeling in your legs and feet. This means that you may not notice minor injuries to your feet that could lead to more serious problems. Finding and treating problems early is the best way to prevent future foot problems. How to care for your feet Foot hygiene  Wash your feet daily with warm water and mild soap. Do not use hot water. Then, pat your feet and the areas between your toes until they are fully dry. Do  not soak your feet. This can dry your skin. Trim your toenails straight across. Do not dig under them or around the cuticle. File the edges of your nails with an emery board or nail file. Apply a moisturizing lotion or petroleum jelly to the skin on your feet and to dry, brittle toenails. Use lotion that does not contain alcohol and is unscented. Do not apply lotion between your toes. Shoes and socks Wear clean socks or stockings every day. Make sure they are not too tight. Do not wear knee-high stockings. These may decrease blood flow to your legs. Wear shoes that fit well and have enough cushioning. Always look in your shoes before you put them on to be sure there are no objects inside. To break in new shoes, wear them for just a few hours a day. This prevents injuries on your feet. Wounds, scrapes, corns, and calluses  Check your feet daily for blisters, cuts, bruises, sores, and redness. If you cannot see the bottom of your feet, use a mirror or ask someone for help. Do not cut off corns or calluses or try to remove them with medicine. If you find a minor scrape, cut, or break in the skin on your feet, keep it and the skin around it clean and dry. You may clean these areas with mild soap and water. Do not clean the area with peroxide, alcohol, or iodine. If you have a wound, scrape, corn, or callus on your foot, look at it several times a day to make sure it  is healing and not infected. Check for: Redness, swelling, or pain. Fluid or blood. Warmth. Pus or a bad smell. General tips Do not cross your legs. This may decrease blood flow to your feet. Do not use heating pads or hot water bottles on your feet. They may burn your skin. If you have lost feeling in your feet or legs, you may not know this is happening until it is too late. Protect your feet from hot and cold by wearing shoes, such as at the beach or on hot pavement. Schedule a complete foot exam at least once a year or more often if  you have foot problems. Report any cuts, sores, or bruises to your health care provider right away. Where to find more information American Diabetes Association: diabetes.org Association of Diabetes Care & Education Specialists: diabeteseducator.org Contact a health care provider if: You have a condition that increases your risk of infection, and you have any cuts, sores, or bruises on your feet. You have an injury that is not healing. You have redness on your legs or feet. You feel burning or tingling in your legs or feet. You have pain or cramps in your legs and feet. Your legs or feet are numb. Your feet always feel cold. You have pain around any toenails. Get help right away if: You have a wound, scrape, corn, or callus on your foot and: You have signs of infection. You have a fever. You have a red line going up your leg. This information is not intended to replace advice given to you by your health care provider. Make sure you discuss any questions you have with your health care provider. Document Revised: 08/19/2021 Document Reviewed: 08/19/2021 Elsevier Patient Education  2024 [employer].

## 2023-04-28 ENCOUNTER — Encounter: Payer: Self-pay | Admitting: Nurse Practitioner

## 2023-04-28 ENCOUNTER — Ambulatory Visit (INDEPENDENT_AMBULATORY_CARE_PROVIDER_SITE_OTHER): Payer: PPO | Admitting: Nurse Practitioner

## 2023-04-28 VITALS — BP 132/80 | HR 65 | Temp 97.9°F | Ht 72.0 in | Wt 202.0 lb

## 2023-04-28 DIAGNOSIS — Z7984 Long term (current) use of oral hypoglycemic drugs: Secondary | ICD-10-CM | POA: Diagnosis not present

## 2023-04-28 DIAGNOSIS — I152 Hypertension secondary to endocrine disorders: Secondary | ICD-10-CM | POA: Diagnosis not present

## 2023-04-28 DIAGNOSIS — I700 Atherosclerosis of aorta: Secondary | ICD-10-CM | POA: Diagnosis not present

## 2023-04-28 DIAGNOSIS — C679 Malignant neoplasm of bladder, unspecified: Secondary | ICD-10-CM | POA: Diagnosis not present

## 2023-04-28 DIAGNOSIS — R7989 Other specified abnormal findings of blood chemistry: Secondary | ICD-10-CM | POA: Diagnosis not present

## 2023-04-28 DIAGNOSIS — R809 Proteinuria, unspecified: Secondary | ICD-10-CM

## 2023-04-28 DIAGNOSIS — D692 Other nonthrombocytopenic purpura: Secondary | ICD-10-CM

## 2023-04-28 DIAGNOSIS — J069 Acute upper respiratory infection, unspecified: Secondary | ICD-10-CM

## 2023-04-28 DIAGNOSIS — E785 Hyperlipidemia, unspecified: Secondary | ICD-10-CM | POA: Diagnosis not present

## 2023-04-28 DIAGNOSIS — E119 Type 2 diabetes mellitus without complications: Secondary | ICD-10-CM | POA: Diagnosis not present

## 2023-04-28 DIAGNOSIS — I5022 Chronic systolic (congestive) heart failure: Secondary | ICD-10-CM | POA: Diagnosis not present

## 2023-04-28 DIAGNOSIS — E1159 Type 2 diabetes mellitus with other circulatory complications: Secondary | ICD-10-CM | POA: Diagnosis not present

## 2023-04-28 DIAGNOSIS — E1129 Type 2 diabetes mellitus with other diabetic kidney complication: Secondary | ICD-10-CM

## 2023-04-28 DIAGNOSIS — E1169 Type 2 diabetes mellitus with other specified complication: Secondary | ICD-10-CM

## 2023-04-28 LAB — BAYER DCA HB A1C WAIVED: HB A1C (BAYER DCA - WAIVED): 6.7 % — ABNORMAL HIGH (ref 4.8–5.6)

## 2023-04-28 LAB — MICROALBUMIN, URINE WAIVED
Creatinine, Urine Waived: 300 mg/dL (ref 10–300)
Microalb, Ur Waived: 80 mg/L — ABNORMAL HIGH (ref 0–19)

## 2023-04-28 MED ORDER — LOSARTAN POTASSIUM 25 MG PO TABS: ORAL_TABLET | ORAL | 4 refills | Status: AC

## 2023-04-28 MED ORDER — FUROSEMIDE 40 MG PO TABS
20.0000 mg | ORAL_TABLET | Freq: Every day | ORAL | 4 refills | Status: AC
Start: 2023-04-28 — End: ?

## 2023-04-28 MED ORDER — METOPROLOL TARTRATE 25 MG PO TABS: 25.0000 mg | ORAL_TABLET | Freq: Two times a day (BID) | ORAL | 4 refills | Status: AC

## 2023-04-28 MED ORDER — ATORVASTATIN CALCIUM 40 MG PO TABS: ORAL_TABLET | ORAL | 4 refills | Status: AC

## 2023-04-28 MED ORDER — PREDNISONE 20 MG PO TABS: 40.0000 mg | ORAL_TABLET | Freq: Every day | ORAL | 0 refills | Status: AC

## 2023-04-28 MED ORDER — JANUMET XR 50-1000 MG PO TB24: 1.0000 | ORAL_TABLET | Freq: Two times a day (BID) | ORAL | 4 refills | Status: DC

## 2023-04-28 NOTE — Assessment & Plan Note (Signed)
 Ongoing and fluctuating.  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  Thyroid ultrasound was reassuring.  Recent labs showed mild elevation in TSH.   He denies symptoms.

## 2023-04-28 NOTE — Assessment & Plan Note (Signed)
 Chronic, stable.  BP at goal for age on recheck.  Avoid hypotension. Continue current medication regimen and adjust as needed.  Urine ALB 80 (February 2025).  Losartan for kidney protection.  Refer to nephrology if worsening kidney function.  Recommend he monitor his BP regularly at home and follow DASH diet.  Continue to collaborate with cardiology.  LABS: CMP.  Return in 3 months.

## 2023-04-28 NOTE — Assessment & Plan Note (Signed)
Chronic, stable.  Noted on imaging 02/08/18.  Continue daily medications for prevention, statin therapy. 

## 2023-04-28 NOTE — Assessment & Plan Note (Signed)
 Chronic, ongoing with A1c 6.7% today, stable for age.  Urine ALB 80 (February 2025).   Losartan for kidney protection.  Continue current medication regimen and adjust as needed.   Avoid hypoglycemia due to advanced age.  Will refer him to nephrology if worsening kidney function presents. Return in 3 months.  - ARB and Statin on board - Vaccinations up to date - Eye and foot exams up to date.

## 2023-04-28 NOTE — Assessment & Plan Note (Signed)
 Chronic, ongoing.  Euvolemic today with baseline edema to BLE.  Continue collaboration with cardiology and current medication regimen.  Recommend: - Reminded to call for an overnight weight gain of >2 pounds or a weekly weight weight of >5 pounds - not adding salt to his food and has been reading food labels. Reviewed the importance of keeping daily sodium intake to 2000mg  daily  - Avoid NSAIDs

## 2023-04-28 NOTE — Assessment & Plan Note (Signed)
 Ongoing.  As evidenced by scattered bruises bilaterall upper extremities.  Gentle skin cleansers at home and lotion.  Monitor for skin breakdown, report to provider if present.

## 2023-04-28 NOTE — Progress Notes (Signed)
 BP 132/80 (BP Location: Left Arm, Patient Position: Sitting, Cuff Size: Normal)   Pulse 65   Temp 97.9 F (36.6 C) (Oral)   Ht 6' (1.829 m)   Wt 202 lb (91.6 kg)   SpO2 95%   BMI 27.40 kg/m    Subjective:    Patient ID: [name], male    DOB: 1941/01/27, 83 y.o.   MRN: [id number]  HPI: GREGORY BARRICK is a 83 y.o. male  Chief Complaint  Patient presents with   Anemia   Diabetes   Hyperlipidemia   Hypertension   DIABETES A1c 6.8% in November.  Continues to take Janumet with good control. Hypoglycemic episodes:no Polydipsia/polyuria: no Visual disturbance: no Chest pain: no Paresthesias: no Glucose Monitoring: no             Accucheck frequency: Not Checking             Fasting glucose:             Post prandial:             Evening:             Before meals: Taking Insulin?: no             Long acting insulin:             Short acting insulin: Blood Pressure Monitoring: rarely Retinal Examination: Up To Date -- [place] Foot Exam: Up to Date Pneumovax: Up to Date Influenza: Up to Date Aspirin: yes   HYPERTENSION / HYPERLIPIDEMIA/HF Continues Lasix 20 MG daily, Metoprolol 25 MG BID, Losartan 12.5 MG, Lipitor 40 MG, ASA.  Moderate aortic stenosis noted on past imaging. Saw cardiology last 11/15/22 with no changes. Last echo 11/30/22 -- mild LV dysfunction with LVH, EF 50%.  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 Satisfied with current treatment? yes Duration of hypertension: chronic BP monitoring frequency: not checking BP range:  BP medication side effects: no Duration of hyperlipidemia: chronic Cholesterol medication side effects: no Cholesterol supplements: none Medication compliance: good compliance Aspirin: yes Recent stressors: no Recurrent headaches: no Visual changes: no Palpitations: no Dyspnea: no -- no orthopnea Chest pain: no - with exertion does get tightness Lower extremity edema: yes, baseline  Dizzy/lightheaded:  no  UPPER RESPIRATORY TRACT INFECTION Started to feel bad this morning when he woke-up.  Wife recently had similar.   Fever: no Cough: yes Shortness of breath: no Wheezing: no Chest pain: no Chest tightness: yes Chest congestion: yes Nasal congestion: yes Runny nose: yes Post nasal drip: yes Sneezing: no Sore throat: no Swollen glands: no Sinus pressure: no Headache: no Face pain: no Toothache: no Ear pain: none Ear pressure: none Eyes red/itching:no Eye drainage/crusting: no  Vomiting: no Rash: no Fatigue: yes Sick contacts: yes Strep contacts: no  Context: fluctuating Recurrent sinusitis: no Relief with OTC cold/cough medications: yes  Treatments attempted: nothing    ELEVATED TSH Subclinical hypothyroid.  No current medications or symptoms. Fatigue: currently sick Cold intolerance: no Heat intolerance: no Weight gain: no Weight loss: no Constipation: no Diarrhea/loose stools: no Palpitations: no Lower extremity edema: yes at baseline Anxiety/depressed mood: no   BLADDER CANCER: Follows with urology.  Last visit on 01/12/23 for cystoscopy - which was clear. He does endorse that each time he has procedures it takes longer to recover, so he is pondering whether to continue doing these.  He would prefer not to have anymore chemo.      04/28/2023  8:28 AM 01/26/2023    8:13 AM 12/28/2022    8:25 AM 10/25/2022    8:52 AM 04/26/2022    8:40 AM  Depression screen PHQ 2/9  Decreased Interest 2 1 0 0 0  Down, Depressed, Hopeless 1 0 0 0 0  PHQ - 2 Score 3 1 0 0 0  Altered sleeping 1 1 0 0 3  Tired, decreased energy 2 1 0 0 0  Change in appetite 0 0 0 0 0  Feeling bad or failure about yourself  0 0 0 0 0  Trouble concentrating 0 0 0 0 0  Moving slowly or fidgety/restless 0 1 0 0 0  Suicidal thoughts 0 0 0 0 0  PHQ-9 Score 6 4 0 0 3  Difficult doing work/chores Not difficult at all Not difficult at all Not difficult at all Not difficult at all Not difficult  at all       04/28/2023    8:29 AM 01/26/2023    8:13 AM 10/25/2022    8:52 AM 04/26/2022    8:40 AM  GAD 7 : Generalized Anxiety Score  Nervous, Anxious, on Edge 0 0 0 0  Control/stop worrying 0 0 0 0  Worry too much - different things 0 0 0 0  Trouble relaxing 0 0 0 0  Restless 0 0 0 0  Easily annoyed or irritable 1 0 0 0  Afraid - awful might happen 0 0 0 0  Total GAD 7 Score 1 0 0 0  Anxiety Difficulty  Not difficult at all Not difficult at all Not difficult at all   Relevant past medical, surgical, family and social history reviewed and updated as indicated. Interim medical history since our last visit reviewed. Allergies and medications reviewed and updated.  Review of Systems  Constitutional:  Positive for fatigue. Negative for activity change, appetite change, diaphoresis, fever and unexpected weight change.  HENT:  Positive for congestion, postnasal drip, rhinorrhea and voice change. Negative for ear discharge, ear pain, sinus pressure, sinus pain and sore throat.   Respiratory:  Positive for cough. Negative for chest tightness, shortness of breath and wheezing.   Cardiovascular:  Positive for leg swelling (baseline). Negative for chest pain and palpitations.  Gastrointestinal: Negative.   Endocrine: Negative for cold intolerance, heat intolerance, polydipsia, polyphagia and polyuria.  Neurological: Negative.   Psychiatric/Behavioral: Negative.     Per HPI unless specifically indicated above     Objective:    BP 132/80 (BP Location: Left Arm, Patient Position: Sitting, Cuff Size: Normal)   Pulse 65   Temp 97.9 F (36.6 C) (Oral)   Ht 6' (1.829 m)   Wt 202 lb (91.6 kg)   SpO2 95%   BMI 27.40 kg/m   Wt Readings from Last 3 Encounters:  04/28/23 202 lb (91.6 kg)  01/26/23 203 lb 3.2 oz (92.2 kg)  01/12/23 203 lb (92.1 kg)    Physical Exam Vitals and nursing note reviewed.  Constitutional:      General: He is awake. He is not in acute distress.    Appearance:  He is well-developed and well-groomed. He is not ill-appearing or toxic-appearing.  HENT:     Head: Normocephalic.     Right Ear: Hearing, ear canal and external ear normal. A middle ear effusion is present. There is no impacted cerumen. Tympanic membrane is not injected.     Left Ear: Hearing, ear canal and external ear normal. A middle ear effusion is present. There is  no impacted cerumen. Tympanic membrane is not injected.     Nose: Rhinorrhea present. Rhinorrhea is clear.     Right Sinus: No maxillary sinus tenderness or frontal sinus tenderness.     Left Sinus: No maxillary sinus tenderness or frontal sinus tenderness.     Mouth/Throat:     Mouth: Mucous membranes are moist.     Pharynx: Posterior oropharyngeal erythema (mild) present. No pharyngeal swelling or oropharyngeal exudate.  Eyes:     General: Lids are normal.     Extraocular Movements: Extraocular movements intact.     Conjunctiva/sclera: Conjunctivae normal.  Neck:     Thyroid: No thyromegaly.     Vascular: No carotid bruit.  Cardiovascular:     Rate and Rhythm: Regular rhythm. Bradycardia present.     Heart sounds: Murmur heard.     Systolic murmur is present with a grade of 2/6.     No gallop.  Pulmonary:     Effort: No accessory muscle usage or respiratory distress.     Breath sounds: Normal breath sounds. No decreased breath sounds, wheezing or rales.     Comments: Hoarsness. Abdominal:     General: Bowel sounds are normal. There is no distension.     Palpations: Abdomen is soft.     Tenderness: There is no abdominal tenderness.  Musculoskeletal:     Cervical back: Full passive range of motion without pain.     Right lower leg: No edema.     Left lower leg: No edema.  Lymphadenopathy:     Cervical: No cervical adenopathy.  Skin:    General: Skin is warm.     Capillary Refill: Capillary refill takes less than 2 seconds.     Findings: Bruising present.     Comments: Scattered bruising bilateral upper  extremities.  Neurological:     Mental Status: He is alert and oriented to person, place, and time.     Deep Tendon Reflexes: Reflexes are normal and symmetric.     Reflex Scores:      Brachioradialis reflexes are 2+ on the right side and 2+ on the left side.      Patellar reflexes are 2+ on the right side and 2+ on the left side. Psychiatric:        Attention and Perception: Attention normal.        Mood and Affect: Mood normal.        Speech: Speech normal.        Behavior: Behavior normal. Behavior is cooperative.        Thought Content: Thought content normal.    Diabetic Foot Exam - Simple   Simple Foot Form Visual Inspection See comments: Yes Sensation Testing See comments: Yes Pulse Check Posterior Tibialis and Dorsalis pulse intact bilaterally: Yes Comments Dry skin to both feet and thick toenails.  Sensation left 5/10 and right 7/10.     Results for orders placed or performed in visit on 04/28/23  Bayer DCA Hb A1c Waived   Collection Time: 04/28/23  8:31 AM  Result Value Ref Range   HB A1C (BAYER DCA - WAIVED) 6.7 (H) 4.8 - 5.6 %  Microalbumin, Urine Waived   Collection Time: 04/28/23  8:31 AM  Result Value Ref Range   Microalb, Ur Waived 80 (H) 0 - 19 mg/L   Creatinine, Urine Waived 300 10 - 300 mg/dL   Microalb/Creat Ratio 30-300 (H) <30 mg/g      Assessment & Plan:   Problem List Items Addressed  This Visit       Cardiovascular and Mediastinum   Aortic atherosclerosis (HCC)   Chronic, stable.  Noted on imaging 02/08/18.  Continue daily medications for prevention, statin therapy.      Relevant Medications   atorvastatin (LIPITOR) 40 MG tablet   furosemide (LASIX) 40 MG tablet   losartan (COZAAR) 25 MG tablet   metoprolol tartrate (LOPRESSOR) 25 MG tablet   Chronic systolic heart failure (HCC)   Chronic, ongoing.  Euvolemic today with baseline edema to BLE.  Continue collaboration with cardiology and current medication regimen.  Recommend: - Reminded  to call for an overnight weight gain of >2 pounds or a weekly weight weight of >5 pounds - not adding salt to his food and has been reading food labels. Reviewed the importance of keeping daily sodium intake to 2000mg  daily  - Avoid NSAIDs       Relevant Medications   atorvastatin (LIPITOR) 40 MG tablet   furosemide (LASIX) 40 MG tablet   losartan (COZAAR) 25 MG tablet   metoprolol tartrate (LOPRESSOR) 25 MG tablet   Hypertension associated with type 2 diabetes mellitus (HCC)   Chronic, stable.  BP at goal for age on recheck.  Avoid hypotension. Continue current medication regimen and adjust as needed.  Urine ALB 80 (February 2025).  Losartan for kidney protection.  Refer to nephrology if worsening kidney function.  Recommend he monitor his BP regularly at home and follow DASH diet.  Continue to collaborate with cardiology.  LABS: CMP.  Return in 3 months.      Relevant Medications   atorvastatin (LIPITOR) 40 MG tablet   furosemide (LASIX) 40 MG tablet   SitaGLIPtin-MetFORMIN HCl (JANUMET XR) 50-1000 MG TB24   losartan (COZAAR) 25 MG tablet   metoprolol tartrate (LOPRESSOR) 25 MG tablet   Other Relevant Orders   Bayer DCA Hb A1c Waived (Completed)   Microalbumin, Urine Waived (Completed)   Comprehensive metabolic panel   Senile purpura (HCC)   Ongoing.  As evidenced by scattered bruises bilaterall upper extremities.  Gentle skin cleansers at home and lotion.  Monitor for skin breakdown, report to provider if present.      Relevant Medications   atorvastatin (LIPITOR) 40 MG tablet   furosemide (LASIX) 40 MG tablet   losartan (COZAAR) 25 MG tablet   metoprolol tartrate (LOPRESSOR) 25 MG tablet     Respiratory   Viral upper respiratory tract infection   Acute and recently started.  Wife is getting over similar.  No fever.  Hoarseness today.  Will send in Prednisone 40 MG for 5 days -- recommend he monitor HR and sugar with this, discussed reasons to stop taking.  Recommend: -  Increased rest - Increasing Fluids - Acetaminophen as needed for fever/pain.  - Salt water gargling, chloraseptic spray and throat lozenges - OTC Coricidin - Mucinex.  - Saline sinus flushes or a neti pot.  - Humidifying the air.         Endocrine   Diabetes mellitus treated with oral medication (HCC)   Refer to diabetes with proteinuria plan of care.      Relevant Medications   atorvastatin (LIPITOR) 40 MG tablet   SitaGLIPtin-MetFORMIN HCl (JANUMET XR) 50-1000 MG TB24   losartan (COZAAR) 25 MG tablet   Other Relevant Orders   Bayer DCA Hb A1c Waived (Completed)   Microalbumin, Urine Waived (Completed)   Hyperlipidemia associated with type 2 diabetes mellitus (HCC)   Chronic, ongoing.  Continue current medication regimen and adjust  as needed.  Lipid panel today.      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  Urine ALB 80 (February 2025).   Losartan for kidney protection.  Continue current medication regimen and adjust as needed.   Avoid hypoglycemia due to advanced age.  Will refer him to nephrology if worsening kidney function presents. Return in 3 months.  - ARB and Statin on board - Vaccinations up to date - Eye and foot exams up to date.      Relevant Medications   atorvastatin (LIPITOR) 40 MG tablet   SitaGLIPtin-MetFORMIN HCl (JANUMET XR) 50-1000 MG TB24   losartan (COZAAR) 25 MG tablet   Other Relevant Orders   Bayer DCA Hb A1c Waived (Completed)     Genitourinary   Bladder cancer (HCC)   Chronic.  Continue to collaborate with  urology, at length discussion on goals of care will continue at visits.      Relevant Medications   predniSONE (DELTASONE) 20 MG tablet     Other   Elevated TSH   Ongoing and fluctuating.  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  Thyroid ultrasound was reassuring.  Recent labs showed mild elevation in TSH.   He denies symptoms.      Relevant Orders   TSH   T4, free     Follow up plan: Return in about 3 months (around 07/26/2023) for T2DM, HTN/HLD.

## 2023-04-28 NOTE — Assessment & Plan Note (Signed)
 Acute and recently started.  Wife is getting over similar.  No fever.  Hoarseness today.  Will send in Prednisone 40 MG for 5 days -- recommend he monitor HR and sugar with this, discussed reasons to stop taking.  Recommend: - Increased rest - Increasing Fluids - Acetaminophen as needed for fever/pain.  - Salt water gargling, chloraseptic spray and throat lozenges - OTC Coricidin - Mucinex.  - Saline sinus flushes or a neti pot.  - Humidifying the air.

## 2023-04-28 NOTE — Assessment & Plan Note (Signed)
 Chronic, ongoing.  Continue current medication regimen and adjust as needed. Lipid panel today.

## 2023-04-28 NOTE — Assessment & Plan Note (Signed)
 Chronic.  Continue to collaborate with urology, at length discussion on goals of care will continue at visits.

## 2023-04-28 NOTE — Assessment & Plan Note (Signed)
 Refer to diabetes with proteinuria plan of care.

## 2023-04-29 ENCOUNTER — Encounter: Payer: Self-pay | Admitting: Nurse Practitioner

## 2023-04-29 LAB — COMPREHENSIVE METABOLIC PANEL
ALT: 16 IU/L (ref 0–44)
AST: 24 IU/L (ref 0–40)
Albumin: 4.2 g/dL (ref 3.7–4.7)
Alkaline Phosphatase: 73 IU/L (ref 44–121)
BUN/Creatinine Ratio: 21 (ref 10–24)
BUN: 25 mg/dL (ref 8–27)
Bilirubin Total: 0.7 mg/dL (ref 0.0–1.2)
CO2: 26 mmol/L (ref 20–29)
Calcium: 9.2 mg/dL (ref 8.6–10.2)
Chloride: 105 mmol/L (ref 96–106)
Creatinine, Ser: 1.21 mg/dL (ref 0.76–1.27)
Globulin, Total: 2.3 g/dL (ref 1.5–4.5)
Glucose: 133 mg/dL — ABNORMAL HIGH (ref 70–99)
Potassium: 3.9 mmol/L (ref 3.5–5.2)
Sodium: 146 mmol/L — ABNORMAL HIGH (ref 134–144)
Total Protein: 6.5 g/dL (ref 6.0–8.5)
eGFR: 59 mL/min/{1.73_m2} — ABNORMAL LOW (ref >59)

## 2023-04-29 LAB — LIPID PANEL W/O CHOL/HDL RATIO
Cholesterol, Total: 111 mg/dL (ref 100–199)
HDL: 52 mg/dL (ref >39)
LDL Chol Calc (NIH): 44 mg/dL (ref 0–99)
Triglycerides: 75 mg/dL (ref 0–149)
VLDL Cholesterol Cal: 15 mg/dL (ref 5–40)

## 2023-04-29 LAB — TSH: TSH: 3.87 u[IU]/mL (ref 0.450–4.500)

## 2023-04-29 LAB — T4, FREE: Free T4: 1.17 ng/dL (ref 0.82–1.77)

## 2023-04-29 NOTE — Progress Notes (Signed)
 Contacted via MyChart   Good afternoon John [name], always good to see you and hope you sound less like [place] today: - Kidney function eGFR is a little low and liver function stable. - Sodium a little high, reduce salt intake and add just a little water. - Remainder of labs are all stable.  No medication changes needed.  Any questions? Keep being amazing!!  Thank you for allowing me to participate in your care.  I appreciate you. Kindest regards, Mayson Mcneish

## 2023-05-05 MED ORDER — AMOXICILLIN-POT CLAVULANATE 875-125 MG PO TABS: 1.0000 | ORAL_TABLET | Freq: Two times a day (BID) | ORAL | 0 refills | Status: AC

## 2023-05-05 NOTE — Addendum Note (Signed)
 Addended by: Aura Dials T on: 05/05/2023 12:02 PM   Modules accepted: Orders

## 2023-05-16 DIAGNOSIS — R079 Chest pain, unspecified: Secondary | ICD-10-CM | POA: Diagnosis not present

## 2023-05-16 DIAGNOSIS — Z952 Presence of prosthetic heart valve: Secondary | ICD-10-CM | POA: Diagnosis not present

## 2023-05-16 DIAGNOSIS — E785 Hyperlipidemia, unspecified: Secondary | ICD-10-CM | POA: Diagnosis not present

## 2023-05-16 DIAGNOSIS — E119 Type 2 diabetes mellitus without complications: Secondary | ICD-10-CM | POA: Diagnosis not present

## 2023-05-16 DIAGNOSIS — Z951 Presence of aortocoronary bypass graft: Secondary | ICD-10-CM | POA: Diagnosis not present

## 2023-05-16 DIAGNOSIS — I10 Essential (primary) hypertension: Secondary | ICD-10-CM | POA: Diagnosis not present

## 2023-07-13 ENCOUNTER — Ambulatory Visit: Payer: Self-pay | Admitting: Urology

## 2023-07-13 VITALS — BP 162/72 | HR 72 | Ht 72.0 in | Wt 203.0 lb

## 2023-07-13 DIAGNOSIS — Z8551 Personal history of malignant neoplasm of bladder: Secondary | ICD-10-CM

## 2023-07-13 LAB — URINALYSIS, COMPLETE
Bilirubin, UA: NEGATIVE
Glucose, UA: NEGATIVE
Ketones, UA: NEGATIVE
Leukocytes,UA: NEGATIVE
Nitrite, UA: NEGATIVE
Protein,UA: NEGATIVE
RBC, UA: NEGATIVE
Specific Gravity, UA: 1.015 (ref 1.005–1.030)
Urobilinogen, Ur: 0.2 mg/dL (ref 0.2–1.0)
pH, UA: 6 (ref 5.0–7.5)

## 2023-07-13 LAB — MICROSCOPIC EXAMINATION: Bacteria, UA: NONE SEEN

## 2023-07-13 MED ORDER — CIPROFLOXACIN HCL 500 MG PO TABS
500.0000 mg | ORAL_TABLET | Freq: Once | ORAL | Status: AC
  Administered 2023-07-13: 500 mg via ORAL

## 2023-07-13 NOTE — Progress Notes (Signed)
    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 male presents for 4 month surveillance cystoscopy  Refer to rooming tab for vitals  Cystoscopy Procedure Note  Patient identification was confirmed, informed consent was obtained, and patient was prepped using Betadine  solution.  Lidocaine  jelly was administered per urethral meatus.     Pre-Procedure: - Inspection reveals a normal caliber urethral meatus.  Procedure: The flexible cystoscope was introduced without difficulty - Wide caliber bulbar stricture - Moderate lateral lobe enlargement prostate  - Elevated bladder neck, mild - Bilateral ureteral orifices identified - Bladder mucosa  reveals no ulcers, tumors - Posterior wall diverticulum - No bladder stones - Mild trabeculation  Retroflexion shows no abnormality/tumor   Post-Procedure: - Patient tolerated the procedure well  Assessment/ Plan: No evidence recurrent urothelial carcinoma Surveillance cystoscopy 1 year Cipro  500 mg post procedure due to history post cystoscopy UTI    Geraline Knapp, MD

## 2023-07-24 NOTE — Patient Instructions (Signed)
Be Involved in Caring For Your Health:  Taking Medications When medications are taken as directed, they can greatly improve your health. But if they are not taken as prescribed, they may not work. In some cases, not taking them correctly can be harmful. To help ensure your treatment remains effective and safe, understand your medications and how to take them. Bring your medications to each visit for review by your provider.  Your lab results, notes, and after visit summary will be available on My Chart. We strongly encourage you to use this feature. If lab results are abnormal the clinic will contact you with the appropriate steps. If the clinic does not contact you assume the results are satisfactory. You can always view your results on My Chart. If you have questions regarding your health or results, please contact the clinic during office hours. You can also ask questions on My Chart.  We at [facility] are grateful that you chose [place] to provide your care. We strive to provide evidence-based and compassionate care and are always looking for feedback. If you get a survey from the clinic please complete this so we can hear your opinions.  Diabetes Mellitus and Exercise Regular exercise is important for your health, especially if you have diabetes mellitus. Exercise is not just about losing weight. It can also help you increase muscle strength and bone density and reduce body fat and stress. This can help your level of endurance and make you more fit and flexible. Why should I exercise if I have diabetes? Exercise has many benefits for people with diabetes. It can: Help lower and control your blood sugar (glucose). Help your body respond better and become more sensitive to the hormone insulin. Reduce how much insulin your body needs. Lower your risk for heart disease by: Lowering how much "bad" cholesterol and triglycerides you have in your body. Increasing how much "good" cholesterol  you have in your body. Lowering your blood pressure. Lowering your blood glucose levels. What is my activity plan? Your health care provider or an expert trained in diabetes care (certified diabetes educator) can help you make an activity plan. This plan can help you find the type of exercise that works for you. It may also tell you how often to exercise and for how long. Be sure to: Get at least 150 minutes of medium-intensity or high-intensity exercise each week. This may involve brisk walking, biking, or water aerobics. Do stretching and strengthening exercises at least 2 times a week. This may involve yoga or weight lifting. Spread out your activity over at least 3 days of the week. Get some form of physical activity each day. Do not go more than 2 days in a row without some kind of activity. Avoid being inactive for more than 30 minutes at a time. Take frequent breaks to walk or stretch. Choose activities that you enjoy. Set goals that you know you can accomplish. Start slowly and increase the intensity of your exercise over time. How do I manage my diabetes during exercise?  Monitor your blood glucose Check your blood glucose before and after you exercise. If your blood glucose is 240 mg/dL (40.9 mmol/L) or higher before you exercise, check your urine for ketones. These are chemicals created by the liver. If you have ketones in your urine, do not exercise until your blood glucose returns to normal. If your blood glucose is 100 mg/dL (5.6 mmol/L) or lower, eat a snack that has 15-20 grams of carbohydrate in  it. Check your blood glucose 15 minutes after the snack to make sure that your level is above 100 mg/dL (5.6 mmol/L) before you start to exercise. Your risk for low blood glucose (hypoglycemia) goes up during and after exercise. Know the symptoms of this condition and how to treat it. Follow these instructions at home: Keep a carbohydrate snack on hand for use before, during, and after  exercise. This can help prevent or treat hypoglycemia. Avoid injecting insulin into parts of your body that are going to be used during exercise. This may include: Your arms, when you are going to play tennis. Your legs, when you are about to go jogging. Keep track of your exercise habits. This can help you and your health care provider watch and adjust your activity plan. Write down: What you eat before and after you exercise. Blood glucose levels before and after you exercise. The type and amount of exercise you do. Talk to your health care provider before you start a new activity. They may need to: Make sure that the activity is safe for you. Adjust your insulin, other medicines, and food that you eat. Drink water while you exercise. This can stop you from losing too much water (dehydration). It can also prevent problems caused by having a lot of heat in your body (heat stroke). Where to find more information American Diabetes Association: diabetes.org Association of Diabetes Care & Education Specialists: diabeteseducator.org This information is not intended to replace advice given to you by your health care provider. Make sure you discuss any questions you have with your health care provider. Document Revised: 08/05/2021 Document Reviewed: 08/05/2021 Elsevier Patient Education  2024 [employer].

## 2023-07-29 ENCOUNTER — Ambulatory Visit (INDEPENDENT_AMBULATORY_CARE_PROVIDER_SITE_OTHER): Payer: PPO | Admitting: Nurse Practitioner

## 2023-07-29 ENCOUNTER — Encounter: Payer: Self-pay | Admitting: Nurse Practitioner

## 2023-07-29 VITALS — BP 127/62 | HR 56 | Temp 97.5°F | Ht 72.0 in | Wt 198.2 lb

## 2023-07-29 DIAGNOSIS — I350 Nonrheumatic aortic (valve) stenosis: Secondary | ICD-10-CM

## 2023-07-29 DIAGNOSIS — D508 Other iron deficiency anemias: Secondary | ICD-10-CM

## 2023-07-29 DIAGNOSIS — I700 Atherosclerosis of aorta: Secondary | ICD-10-CM

## 2023-07-29 DIAGNOSIS — I152 Hypertension secondary to endocrine disorders: Secondary | ICD-10-CM | POA: Diagnosis not present

## 2023-07-29 DIAGNOSIS — E785 Hyperlipidemia, unspecified: Secondary | ICD-10-CM | POA: Diagnosis not present

## 2023-07-29 DIAGNOSIS — E1129 Type 2 diabetes mellitus with other diabetic kidney complication: Secondary | ICD-10-CM

## 2023-07-29 DIAGNOSIS — R809 Proteinuria, unspecified: Secondary | ICD-10-CM | POA: Diagnosis not present

## 2023-07-29 DIAGNOSIS — E119 Type 2 diabetes mellitus without complications: Secondary | ICD-10-CM | POA: Diagnosis not present

## 2023-07-29 DIAGNOSIS — E1159 Type 2 diabetes mellitus with other circulatory complications: Secondary | ICD-10-CM | POA: Diagnosis not present

## 2023-07-29 DIAGNOSIS — E1169 Type 2 diabetes mellitus with other specified complication: Secondary | ICD-10-CM

## 2023-07-29 DIAGNOSIS — Z7984 Long term (current) use of oral hypoglycemic drugs: Secondary | ICD-10-CM | POA: Diagnosis not present

## 2023-07-29 DIAGNOSIS — I5022 Chronic systolic (congestive) heart failure: Secondary | ICD-10-CM | POA: Diagnosis not present

## 2023-07-29 DIAGNOSIS — C679 Malignant neoplasm of bladder, unspecified: Secondary | ICD-10-CM

## 2023-07-29 DIAGNOSIS — D692 Other nonthrombocytopenic purpura: Secondary | ICD-10-CM

## 2023-07-29 LAB — BAYER DCA HB A1C WAIVED: HB A1C (BAYER DCA - WAIVED): 6.2 % — ABNORMAL HIGH (ref 4.8–5.6)

## 2023-07-29 NOTE — Assessment & Plan Note (Signed)
 Chronic.  Continue to collaborate with urology, at length discussion on goals of care will continue at visits.

## 2023-07-29 NOTE — Assessment & Plan Note (Signed)
 Chronic, ongoing with A1c 6.2% today, stable for age.  Urine ALB 80 (February 2025).   Losartan  for kidney protection.  Continue current medication regimen and adjust as needed.   Avoid hypoglycemia due to advanced age.  Will refer him to nephrology if worsening kidney function presents. Return in 3 months.  - ARB and Statin on board - Vaccinations up to date - Eye and foot exams up to date.

## 2023-07-29 NOTE — Assessment & Plan Note (Signed)
 Chronic, ongoing.  Continue current medication regimen and adjust as needed. Lipid panel today.

## 2023-07-29 NOTE — Assessment & Plan Note (Signed)
 Refer to diabetes with proteinuria plan of care.

## 2023-07-29 NOTE — Assessment & Plan Note (Signed)
 Chronic, ongoing.  Euvolemic today with baseline edema to BLE.  Continue collaboration with cardiology and current medication regimen.  Recommend: - Reminded to call for an overnight weight gain of >2 pounds or a weekly weight weight of >5 pounds - not adding salt to his food and has been reading food labels. Reviewed the importance of keeping daily sodium intake to 2000mg  daily  - Avoid NSAIDs

## 2023-07-29 NOTE — Assessment & Plan Note (Signed)
 Ongoing, stable.  Continue daily iron.  Recheck iron and CBC today.  If ongoing stability will stop iron supplement.

## 2023-07-29 NOTE — Assessment & Plan Note (Signed)
 Ongoing.  As evidenced by scattered bruises bilaterall upper extremities.  Gentle skin cleansers at home and lotion.  Monitor for skin breakdown, report to provider if present.

## 2023-07-29 NOTE — Assessment & Plan Note (Signed)
Followed by cardiology.  Last echo June 2021.  Continue to collaborate with cardiology. 

## 2023-07-29 NOTE — Assessment & Plan Note (Signed)
Chronic, stable.  Noted on imaging 02/08/18.  Continue daily medications for prevention, statin therapy. 

## 2023-07-29 NOTE — Progress Notes (Signed)
 BP 127/62   Pulse (!) 56   Temp (!) 97.5 F (36.4 C) (Oral)   Ht 6' (1.829 m)   Wt 198 lb 3.2 oz (89.9 kg)   SpO2 94%   BMI 26.88 kg/m    Subjective:    Patient ID: John [name], male    DOB: 26-Jul-1940, 83 y.o.   MRN: [id number]  HPI: John [name] is a 83 y.o. male  Chief Complaint  Patient presents with   Diabetes   Hyperlipidemia   Hypertension   DIABETES A1c February 6.7%. Taking Janumet  1 tablet BID, is taking one tablet daily.  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  Started taking Glococil supplement. Hypoglycemic episodes:no Polydipsia/polyuria: no Visual disturbance: no Chest pain: no Paresthesias: no Glucose Monitoring: no             Accucheck frequency: Not Checking             Fasting glucose:             Post prandial:             Evening:             Before meals: Taking Insulin ?: no             Long acting insulin :             Short acting insulin : Blood Pressure Monitoring: rarely Retinal Examination: Up To Date -- [place] Foot Exam: Up to Date Pneumovax: Up to Date Influenza: Up to Date Aspirin : yes   HYPERTENSION / HYPERLIPIDEMIA/HF Taking Lasix  20 MG daily, Metoprolol  25 MG BID, Losartan  12.5 MG, Lipitor 40 MG, ASA.  Moderate aortic stenosis noted on past imaging. Cardiology last 05/16/23 with no changes. Echo 11/30/22, mild LV dysfunction with LVH, EF 50%.  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 Satisfied with current treatment? yes Duration of hypertension: chronic BP monitoring frequency: not checking BP range:  BP medication side effects: no Duration of hyperlipidemia: chronic Cholesterol medication side effects: no Cholesterol supplements: none Medication compliance: good compliance Aspirin : yes Recent stressors: no Recurrent headaches: no Visual changes: no Palpitations: no Dyspnea: no and no orthopnea Chest pain: occasional tightness when exerts self, has to see cardiology  every 3 months now Lower extremity edema: yes, baseline  Dizzy/lightheaded: no  ANEMIA Anemia status: stable Etiology of anemia: chronic disease Duration of anemia treatment: years Compliance with treatment: good compliance Iron  supplementation side effects: no Severity of anemia: mild Fatigue: no Decreased exercise tolerance: no  Dyspnea on exertion: no Palpitations: no Bleeding: no Pica: no   BLADDER CANCER: Saw urology 07/13/23 for cystoscopy - which was clear. Has been clear for one year, needs to return in one year. Would prefer not to have anymore chemo. Reports it takes longer to recover each time.     07/29/2023    8:10 AM 04/28/2023    8:28 AM 01/26/2023    8:13 AM 12/28/2022    8:25 AM 10/25/2022    8:52 AM  Depression screen PHQ 2/9  Decreased Interest 0 2 1 0 0  Down, Depressed, Hopeless 0 1 0 0 0  PHQ - 2 Score 0 3 1 0 0  Altered sleeping 0 1 1 0 0  Tired, decreased energy 2 2 1  0 0  Change in appetite 0 0 0 0 0  Feeling bad or failure about yourself  0 0 0 0 0  Trouble concentrating 0 0  0 0 0  Moving slowly or fidgety/restless 1 0 1 0 0  Suicidal thoughts 0 0 0 0 0  PHQ-9 Score 3 6 4  0 0  Difficult doing work/chores Not difficult at all Not difficult at all Not difficult at all Not difficult at all Not difficult at all       07/29/2023    8:11 AM 04/28/2023    8:29 AM 01/26/2023    8:13 AM 10/25/2022    8:52 AM  GAD 7 : Generalized Anxiety Score  Nervous, Anxious, on Edge 0 0 0 0  Control/stop worrying 0 0 0 0  Worry too much - different things 0 0 0 0  Trouble relaxing 0 0 0 0  Restless 0 0 0 0  Easily annoyed or irritable 0 1 0 0  Afraid - awful might happen 0 0 0 0  Total GAD 7 Score 0 1 0 0  Anxiety Difficulty Not difficult at all  Not difficult at all Not difficult at all   Relevant past medical, surgical, family and social history reviewed and updated as indicated. Interim medical history since our last visit reviewed. Allergies and medications  reviewed and updated.  Review of Systems  Constitutional:  Negative for activity change, diaphoresis, fatigue and fever.  Respiratory:  Negative for cough, chest tightness, shortness of breath and wheezing.   Cardiovascular:  Negative for chest pain, palpitations and leg swelling.  Gastrointestinal: Negative.   Endocrine: Negative for cold intolerance, heat intolerance, polydipsia, polyphagia and polyuria.  Musculoskeletal: Negative.   Skin: Negative.   Neurological:  Negative for dizziness, syncope, weakness, light-headedness, numbness and headaches.  Psychiatric/Behavioral: Negative.     Per HPI unless specifically indicated above     Objective:    BP 127/62   Pulse (!) 56   Temp (!) 97.5 F (36.4 C) (Oral)   Ht 6' (1.829 m)   Wt 198 lb 3.2 oz (89.9 kg)   SpO2 94%   BMI 26.88 kg/m   Wt Readings from Last 3 Encounters:  07/29/23 198 lb 3.2 oz (89.9 kg)  07/13/23 203 lb (92.1 kg)  04/28/23 202 lb (91.6 kg)    Physical Exam Vitals and nursing note reviewed.  Constitutional:      General: He is awake. He is not in acute distress.    Appearance: He is well-developed and well-groomed. He is not ill-appearing or toxic-appearing.  HENT:     Head: Normocephalic.     Right Ear: Hearing and external ear normal.     Left Ear: Hearing and external ear normal.  Eyes:     General: Lids are normal.     Extraocular Movements: Extraocular movements intact.     Conjunctiva/sclera: Conjunctivae normal.  Neck:     Thyroid : No thyromegaly.     Vascular: No carotid bruit.  Cardiovascular:     Rate and Rhythm: Regular rhythm. Bradycardia present.     Heart sounds: Normal heart sounds. No murmur heard.    No gallop.  Pulmonary:     Effort: No accessory muscle usage or respiratory distress.     Breath sounds: Normal breath sounds.  Abdominal:     General: Bowel sounds are normal. There is no distension.     Palpations: Abdomen is soft.     Tenderness: There is no abdominal  tenderness.  Musculoskeletal:     Cervical back: Full passive range of motion without pain.     Right lower leg: No edema.     Left  lower leg: No edema.  Lymphadenopathy:     Cervical: No cervical adenopathy.  Skin:    General: Skin is warm.     Capillary Refill: Capillary refill takes less than 2 seconds.     Findings: Bruising present.     Comments: Scattered bruises bilateral upper extremities.  Neurological:     Mental Status: He is alert and oriented to person, place, and time.     Deep Tendon Reflexes: Reflexes are normal and symmetric.     Reflex Scores:      Brachioradialis reflexes are 2+ on the right side and 2+ on the left side.      Patellar reflexes are 2+ on the right side and 2+ on the left side. Psychiatric:        Attention and Perception: Attention normal.        Mood and Affect: Mood normal.        Speech: Speech normal.        Behavior: Behavior normal. Behavior is cooperative.        Thought Content: Thought content normal.    Results for orders placed or performed in visit on 07/13/23  Microscopic Examination   Collection Time: 07/13/23 10:31 AM   Urine  Result Value Ref Range   WBC, UA 0-5 0 - 5 /hpf   RBC, Urine 0-2 0 - 2 /hpf   Epithelial Cells (non renal) 0-10 0 - 10 /hpf   Bacteria, UA None seen None seen/Few  Urinalysis, Complete   Collection Time: 07/13/23 10:31 AM  Result Value Ref Range   Specific Gravity, UA 1.015 1.005 - 1.030   pH, UA 6.0 5.0 - 7.5   Color, UA Yellow Yellow   Appearance Ur Clear Clear   Leukocytes,UA Negative Negative   Protein,UA Negative Negative/Trace   Glucose, UA Negative Negative   Ketones, UA Negative Negative   RBC, UA Negative Negative   Bilirubin, UA Negative Negative   Urobilinogen, Ur 0.2 0.2 - 1.0 mg/dL   Nitrite, UA Negative Negative   Microscopic Examination Comment    Microscopic Examination See below:       Assessment & Plan:   Problem List Items Addressed This Visit       Cardiovascular and  Mediastinum   Senile purpura (HCC)   Ongoing.  As evidenced by scattered bruises bilaterall upper extremities.  Gentle skin cleansers at home and lotion.  Monitor for skin breakdown, report to provider if present.      Hypertension associated with type 2 diabetes mellitus (HCC)   Chronic, stable.  BP at goal for age on recheck.  Avoid hypotension. Continue current medication regimen and adjust as needed.  Urine ALB 80 (February 2025).  Losartan  for kidney protection.  Refer to nephrology if worsening kidney function.  Recommend he monitor his BP regularly at home and follow DASH diet.  Continue to collaborate with cardiology.  LABS: CBC, CMP.  Return in 3 months.      Relevant Orders   Basic Metabolic Panel (BMET)   CBC w/Diff   Chronic systolic heart failure (HCC)   Chronic, ongoing.  Euvolemic today with baseline edema to BLE.  Continue collaboration with cardiology and current medication regimen.  Recommend: - Reminded to call for an overnight weight gain of >2 pounds or a weekly weight weight of >5 pounds - not adding salt to his food and has been reading food labels. Reviewed the importance of keeping daily sodium intake to 2000mg  daily  - Avoid NSAIDs  Relevant Orders   CBC w/Diff   Aortic valve stenosis, moderate   Followed by cardiology.  Last echo June 2021.  Continue to collaborate with cardiology.      Aortic atherosclerosis (HCC)   Chronic, stable.  Noted on imaging 02/08/18.  Continue daily medications for prevention, statin therapy.        Endocrine   Type 2 diabetes mellitus with proteinuria (HCC) - Primary   Chronic, ongoing with A1c 6.2% today, stable for age.  Urine ALB 80 (February 2025).   Losartan  for kidney protection.  Continue current medication regimen and adjust as needed.   Avoid hypoglycemia due to advanced age.  Will refer him to nephrology if worsening kidney function presents. Return in 3 months.  - ARB and Statin on board - Vaccinations up to  date - Eye and foot exams up to date.      Relevant Orders   Basic Metabolic Panel (BMET)   Bayer DCA Hb A1c Waived (STAT)   Hyperlipidemia associated with type 2 diabetes mellitus (HCC)   Chronic, ongoing.  Continue current medication regimen and adjust as needed.  Lipid panel today.      Relevant Orders   Basic Metabolic Panel (BMET)   CBC w/Diff   Diabetes mellitus treated with oral medication (HCC)   Refer to diabetes with proteinuria plan of care.      Relevant Orders   Bayer DCA Hb A1c Waived (STAT)     Genitourinary   Bladder cancer (HCC)   Chronic.  Continue to collaborate with urology, at length discussion on goals of care will continue at visits.        Other   Iron  deficiency anemia   Ongoing, stable.  Continue daily iron .  Recheck iron  and CBC today.  If ongoing stability will stop iron  supplement.      Relevant Orders   Ferritin   Iron      Follow up plan: Return in about 3 months (around 10/29/2023) for T2DM, HTN/HLD.

## 2023-07-29 NOTE — Assessment & Plan Note (Signed)
 Chronic, stable.  BP at goal for age on recheck.  Avoid hypotension. Continue current medication regimen and adjust as needed.  Urine ALB 80 (February 2025).  Losartan  for kidney protection.  Refer to nephrology if worsening kidney function.  Recommend he monitor his BP regularly at home and follow DASH diet.  Continue to collaborate with cardiology.  LABS: CBC, CMP.  Return in 3 months.

## 2023-07-30 ENCOUNTER — Ambulatory Visit: Payer: Self-pay | Admitting: Nurse Practitioner

## 2023-07-30 LAB — CBC WITH DIFFERENTIAL/PLATELET
Basophils Absolute: 0 10*3/uL (ref 0.0–0.2)
Basos: 0 %
EOS (ABSOLUTE): 0.3 10*3/uL (ref 0.0–0.4)
Eos: 7 %
Hematocrit: 38.4 % (ref 37.5–51.0)
Hemoglobin: 11.9 g/dL — ABNORMAL LOW (ref 13.0–17.7)
Immature Grans (Abs): 0 10*3/uL (ref 0.0–0.1)
Immature Granulocytes: 0 %
Lymphocytes Absolute: 1.7 10*3/uL (ref 0.7–3.1)
Lymphs: 33 %
MCH: 28.4 pg (ref 26.6–33.0)
MCHC: 31 g/dL — ABNORMAL LOW (ref 31.5–35.7)
MCV: 92 fL (ref 79–97)
Monocytes Absolute: 0.4 10*3/uL (ref 0.1–0.9)
Monocytes: 8 %
Neutrophils Absolute: 2.6 10*3/uL (ref 1.4–7.0)
Neutrophils: 52 %
Platelets: 192 10*3/uL (ref 150–450)
RBC: 4.19 x10E6/uL (ref 4.14–5.80)
RDW: 12.4 % (ref 11.6–15.4)
WBC: 5 10*3/uL (ref 3.4–10.8)

## 2023-07-30 LAB — IRON: Iron: 113 ug/dL (ref 38–169)

## 2023-07-30 LAB — FERRITIN: Ferritin: 161 ng/mL (ref 30–400)

## 2023-07-30 LAB — BASIC METABOLIC PANEL WITH GFR
BUN/Creatinine Ratio: 18 (ref 10–24)
BUN: 19 mg/dL (ref 8–27)
CO2: 28 mmol/L (ref 20–29)
Calcium: 9 mg/dL (ref 8.6–10.2)
Chloride: 105 mmol/L (ref 96–106)
Creatinine, Ser: 1.05 mg/dL (ref 0.76–1.27)
Glucose: 115 mg/dL — ABNORMAL HIGH (ref 70–99)
Potassium: 4.4 mmol/L (ref 3.5–5.2)
Sodium: 147 mmol/L — ABNORMAL HIGH (ref 134–144)
eGFR: 70 mL/min/{1.73_m2} (ref >59)

## 2023-07-30 NOTE — Progress Notes (Signed)
 Contacted via MyChart   Good morning John [name], your labs have returned: - Kidney function, creatinine and eGFR, remains normal. Sodium, salt, remains a little elevated try to cut back on salt intake and increase water a little.  Any questions? - CBC continues to show mildly low hemoglobin, but remainder is stable and iron  level is normal.  Any questions? Keep being amazing!!  Thank you for allowing me to participate in your care.  I appreciate you. Kindest regards, Josedejesus Marcum

## 2023-08-08 DIAGNOSIS — Z952 Presence of prosthetic heart valve: Secondary | ICD-10-CM | POA: Diagnosis not present

## 2023-08-08 DIAGNOSIS — I10 Essential (primary) hypertension: Secondary | ICD-10-CM | POA: Diagnosis not present

## 2023-08-08 DIAGNOSIS — Z951 Presence of aortocoronary bypass graft: Secondary | ICD-10-CM | POA: Diagnosis not present

## 2023-08-08 DIAGNOSIS — E785 Hyperlipidemia, unspecified: Secondary | ICD-10-CM | POA: Diagnosis not present

## 2023-08-08 DIAGNOSIS — E119 Type 2 diabetes mellitus without complications: Secondary | ICD-10-CM | POA: Diagnosis not present

## 2023-08-08 DIAGNOSIS — R011 Cardiac murmur, unspecified: Secondary | ICD-10-CM | POA: Diagnosis not present

## 2023-08-08 DIAGNOSIS — R079 Chest pain, unspecified: Secondary | ICD-10-CM | POA: Diagnosis not present

## 2023-08-08 DIAGNOSIS — I350 Nonrheumatic aortic (valve) stenosis: Secondary | ICD-10-CM | POA: Diagnosis not present

## 2023-08-08 NOTE — Progress Notes (Signed)
 Established Patient Visit   Chief Complaint: No chief complaint on file.  Date of Service: 08/08/2023 Date of Birth: 1940-06-10 PCP: Cannady, Jolene Tadzia, NP  History of Present Illness: Mr. Barletta is a 83 y.o.male patient who returns for   1.  Aortic stenosis  2.  Essential hypertension  3.  Hyperlipidemia  4.  Type 2 diabetes  5.  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  AVR, Edwards Magna-Ease pericardial valve 05/05/2017  The patient underwent cardiac catheterization 04/27/2017 which revealed 70% stenosis ostial LAD, 90% stenosis ramus, 80% stenosis ostial left circumflex, and 75% stenosis in the PDA, with severe aortic stenosis with calculated aortic valve area 1.0 cm square.  The patient underwent CABG x4 with LIMA to LAD, SVG to ramus, OM, and PDA, with aortic valve replacement, with 23 mm Edward magna ease pericardial valve, on 05/05/2017, at [facility], performed by Dr. Lucas.   The patient saw his primary care provider on 06/07/2017, who felt that the patient was in atrial fibrillation. 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  24-hour Holter monitor was performed which revealed predominant normal sinus rhythm with a mean heart rate of 74 bpm, frequent premature ventricular contractions, occasional brief ventricular runs the longest lasting 9 beats, without definitive sustained runs of atrial fibrillation or atrial flutter.  The patient returns today for follow-up, reports doing good.  He denies exertional chest pain or shortness of breath.  He denies palpitations or heart racing.  He reports chronic pedal edema, on furosemide  20 mg daily.  The patient is active, works outdoors but does not exercise regularly.  2D echocardiogram 11/30/2022 revealed LVEF 50%, well-seated aortic valve bioprosthesis, with mild mitral and tricuspid regurgitation.  The patient has essential hypertension, blood pressure well controlled on  metoprolol  tartrate, amlodipine , furosemide  and losartan /HCTZ, which are well tolerated without apparent side effects.  The patient follows a low-sodium, no added salt diet.  The patient has hyperlipidemia, currently on atorvastatin , which is well tolerated without apparent side effects, and followed by his primary care provider. LDL cholesterol was 4 39 on 10/25/2022.  The patient follows a low-cholesterol, low-fat diet.  Past Medical and Surgical History  Past Medical History Past Medical History:  Diagnosis Date  . Cancer (CMS/HHS-HCC)   . Diabetes mellitus type 2, uncomplicated (CMS/HHS-HCC)   . Heart murmur   . Hyperlipemia   . Hypertension     Past Surgical History He has no past surgical history on file.   Medications and Allergies  Current Medications  Current Outpatient Medications  Medication Sig Dispense Refill  . amLODIPine  (NORVASC ) 5 MG tablet Take 5 mg by mouth.    . aspirin  81 MG EC tablet Take by mouth    . atorvastatin  (LIPITOR) 40 MG tablet Take 1 tablet (40 mg total) by mouth once daily 90 tablet 3  . cholecalciferol  (VITAMIN D3) 2,000 unit tablet Take 2,000 Units by mouth once daily.    . cyanocobalamin  (VITAMIN B12) 1000 MCG tablet Take 1,000 mcg by mouth once daily.    . FUROsemide  (LASIX ) 40 MG tablet 0.5 tablets (20 mg total) once daily 30 tablet 11  . KLOR-CON  M10 10 mEq ER tablet Take 10 mEq by mouth once daily    . losartan  (COZAAR ) 25 MG tablet Take 12.5 mg by mouth once daily    . losartan  (COZAAR ) 25 MG tablet Take 0.5 tablets by mouth once daily    . losartan -hydrochlorothiazide  (  HYZAAR) 50-12.5 mg tablet     . metoprolol  tartrate (LOPRESSOR ) 25 MG tablet Take 1 tablet (25 mg total) by mouth 2 (two) times daily 60 tablet 6  . naproxen sodium (ALEVE, ANAPROX) 220 MG tablet Take 440 mg by mouth.    . SITagliptin -metformin  (JANUMET  XR) 50-1,000 mg ER 24 hr mutliphase tablet Take by mouth.     No current facility-administered medications for this visit.     Allergies: Aspirin   Social and Family History  Social History  reports that he has quit smoking. He has never used smokeless tobacco. He reports that he does not drink alcohol and does not use drugs.  Family History No family history on file.  Review of Systems   Review of Systems: The patient denies chest pain, shortness of breath, paroxysmal nocturnal dyspnea, palpitations, heart racing, presyncope, syncope, with gait instability, with hearing loss, with chronic pedal edema, with leg cramps. Review of 8 Systems is negative except as described above.  Physical Examination   Vitals:BP 126/68   Pulse 66   Ht 182.9 cm (6')   Wt 89.6 kg (197 lb 9.6 oz)   SpO2 99%   BMI 26.80 kg/m  Ht:182.9 cm (6') Wt:89.6 kg (197 lb 9.6 oz) ADJ:Anib surface area is 2.13 meters squared. Body mass index is 26.8 kg/m.  General: Alert and oriented. Well-appearing. No acute distress. HEENT: Pupils equally reactive to light and accomodation    Neck: Supple, no JVD Lungs: Normal effort of breathing; clear to auscultation bilaterally; no wheezes, rales, rhonchi Heart: Regular rate and rhythm. No murmurs, rubs, or gallops noted.  Abdomen: nondistended Extremities: no cyanosis, clubbing, or edema Peripheral Pulses: 2+ radial bilaterally  Skin: Warm, dry, no diaphoresis  Assessment   83 y.o. male with  1. Heart murmur   2. Chest pain with high risk for cardiac etiology   3. Essential (primary) hypertension   4. Nonrheumatic aortic valve stenosis   5. S/P AVR (aortic valve replacement)   6. S/P coronary artery bypass graft x 4   7. Hyperlipidemia, unspecified hyperlipidemia type   8. Type 2 diabetes mellitus without complication, without long-term current use of insulin  (CMS/HHS-HCC)    83 year old gentleman with 70-month history of new onset exertional chest pain and shortness of breath.  2D echocardiogram revealed moderate to severe aortic stenosis, calculated aortic valve area 1.0 cm square,  peak velocity 3.98 m/s, peak aortic valve gradient of 63.4 mmHg, and a mean gradient of 37.0 mmHg.  Cardiac catheterization revealed three-vessel coronary artery disease and severe aortic stenosis.  The patient underwent successful CABG x4 with aortic valve replacement with pericardial valve.  Patient saw his primary care provider 06/07/2017, who thought that he was in possible atrial fibrillation.  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  24-hour Holter monitor revealed predominant normal sinus rhythm with mean heart rate of 74 bpm, frequent premature ventricular contractions, occasional brief runs, without definitive atrial fibrillation or atrial flutter. Patient has essential hypertension, blood pressure well controlled today on current BP medications.  Patient with recent episodes of chest pain with atypical features, with resolution and without recurrence.  2D echocardiogram 11/30/2022 revealed LVEF 50% with well-seated aortic valve bioprosthesis.   Plan   1.  Continue current medications 2.  Continue low-sodium diet 3.  DASH diet printed instructions given to the patient 4.  Counseled patient about low-cholesterol diet 5.  Continue atorvastatin  for hyperlipidemia management 6.  Low-fat and cholesterol diet printed instructions given to the  patient 7.  Return to clinic for follow-up in 6 months  Orders Placed This Encounter  Procedures  . ECG 12-lead    Return in about 6 months (around 02/07/2024).    MARSA DOOMS, MD PhD [facility]

## 2023-10-30 NOTE — Patient Instructions (Signed)

## 2023-11-03 ENCOUNTER — Ambulatory Visit (INDEPENDENT_AMBULATORY_CARE_PROVIDER_SITE_OTHER): Admitting: Nurse Practitioner

## 2023-11-03 ENCOUNTER — Encounter: Payer: Self-pay | Admitting: Nurse Practitioner

## 2023-11-03 VITALS — BP 132/64 | HR 57 | Ht 72.0 in | Wt 198.0 lb

## 2023-11-03 DIAGNOSIS — E1169 Type 2 diabetes mellitus with other specified complication: Secondary | ICD-10-CM

## 2023-11-03 DIAGNOSIS — I700 Atherosclerosis of aorta: Secondary | ICD-10-CM

## 2023-11-03 DIAGNOSIS — E119 Type 2 diabetes mellitus without complications: Secondary | ICD-10-CM

## 2023-11-03 DIAGNOSIS — D692 Other nonthrombocytopenic purpura: Secondary | ICD-10-CM | POA: Diagnosis not present

## 2023-11-03 DIAGNOSIS — I152 Hypertension secondary to endocrine disorders: Secondary | ICD-10-CM

## 2023-11-03 DIAGNOSIS — I5022 Chronic systolic (congestive) heart failure: Secondary | ICD-10-CM | POA: Diagnosis not present

## 2023-11-03 DIAGNOSIS — E1159 Type 2 diabetes mellitus with other circulatory complications: Secondary | ICD-10-CM | POA: Diagnosis not present

## 2023-11-03 DIAGNOSIS — E785 Hyperlipidemia, unspecified: Secondary | ICD-10-CM | POA: Diagnosis not present

## 2023-11-03 DIAGNOSIS — E1129 Type 2 diabetes mellitus with other diabetic kidney complication: Secondary | ICD-10-CM

## 2023-11-03 DIAGNOSIS — Z23 Encounter for immunization: Secondary | ICD-10-CM

## 2023-11-03 DIAGNOSIS — C679 Malignant neoplasm of bladder, unspecified: Secondary | ICD-10-CM

## 2023-11-03 DIAGNOSIS — R809 Proteinuria, unspecified: Secondary | ICD-10-CM | POA: Diagnosis not present

## 2023-11-03 DIAGNOSIS — Z7984 Long term (current) use of oral hypoglycemic drugs: Secondary | ICD-10-CM

## 2023-11-03 LAB — BAYER DCA HB A1C WAIVED: HB A1C (BAYER DCA - WAIVED): 6.2 % — ABNORMAL HIGH (ref 4.8–5.6)

## 2023-11-03 NOTE — Progress Notes (Addendum)
 BP 132/64 (BP Location: Left Arm, Patient Position: Sitting)   Pulse (!) 57   Ht 6' (1.829 m)   Wt 198 lb (89.8 kg)   SpO2 98%   BMI 26.85 kg/m    Subjective:    Patient ID: John [name], male    DOB: March 11, 1940, 83 y.o.   MRN: [id number]  HPI: John [name] is a 83 y.o. male  Chief Complaint  Patient presents with   Type 2 diabetes mellitus with proteinuria   Hypertension   DIABETES May A1c 6.2%. Continues Janumet  one tablet BID, takes one tablet daily.  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  Started taking Glococil supplement. Hypoglycemic episodes:no Polydipsia/polyuria: no Visual disturbance: no Chest pain: as below Paresthesias: no Glucose Monitoring: no             Accucheck frequency: Not Checking             Fasting glucose:             Post prandial:             Evening:             Before meals: Taking Insulin ?: no             Long acting insulin :             Short acting insulin : Blood Pressure Monitoring: rarely Retinal Examination: Up To Date -- [place] Foot Exam: Up to Date Pneumovax: Up to Date Influenza: Up to Date Aspirin : yes   HYPERTENSION/HYPERLIPIDEMIA/HF Taking Lasix  20 MG daily, Metoprolol  25 MG BID, Losartan  12.5 MG, Lipitor 40 MG, ASA.  Moderate aortic stenosis noted on past imaging. Saw cardiology last on 08/08/23 no changes. Last echo in October 2024 mild LV dysfunction with LVH, EF 50%.  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 Satisfied with current treatment? yes Duration of hypertension: chronic BP monitoring frequency: not checking BP range:  BP medication side effects: no Duration of hyperlipidemia: chronic Cholesterol medication side effects: no Cholesterol supplements: none Medication compliance: good compliance Aspirin : yes Recent stressors: no Recurrent headaches: no Visual changes: no Palpitations: yes Dyspnea: no and no orthopnea Chest pain: occasional episodes, feels like motor is running fast with  heart at times -- runs across chest -- intermittent episodes with no diaphoresis, N&V, headaches, diaphoresis Lower extremity edema: yes, baseline  Dizzy/lightheaded: no  BLADDER CANCER: Urology last seen 07/13/23 for cystoscopy, this was clear. Been clear for > 1 year, needs to return in one year. Would prefer not to have anymore chemo. States he notices recovery time is longer each time cystoscopy now.   Nocturia: 1/night Urinary frequency:no Incomplete voiding: no Urgency: yes Weak urinary stream: sometimes Straining to start stream: sometimes Dysuria: no     11/03/2023    8:17 AM 07/29/2023    8:10 AM 04/28/2023    8:28 AM 01/26/2023    8:13 AM 12/28/2022    8:25 AM  Depression screen PHQ 2/9  Decreased Interest 1 0 2 1 0  Down, Depressed, Hopeless 0 0 1 0 0  PHQ - 2 Score 1 0 3 1 0  Altered sleeping 1 0 1 1 0  Tired, decreased energy 1 2 2 1  0  Change in appetite 0 0 0 0 0  Feeling bad or failure about yourself  0 0 0 0 0  Trouble concentrating 0 0 0 0 0  Moving slowly or fidgety/restless 0 1 0 1 0  Suicidal thoughts 0  0 0 0 0  PHQ-9 Score 3 3 6 4  0  Difficult doing work/chores  Not difficult at all Not difficult at all Not difficult at all Not difficult at all       11/03/2023    8:17 AM 07/29/2023    8:11 AM 04/28/2023    8:29 AM 01/26/2023    8:13 AM  GAD 7 : Generalized Anxiety Score  Nervous, Anxious, on Edge 0 0 0 0  Control/stop worrying 0 0 0 0  Worry too much - different things 0 0 0 0  Trouble relaxing 0 0 0 0  Restless 0 0 0 0  Easily annoyed or irritable 0 0 1 0  Afraid - awful might happen 0 0 0 0  Total GAD 7 Score 0 0 1 0  Anxiety Difficulty  Not difficult at all  Not difficult at all   Relevant past medical, surgical, family and social history reviewed and updated as indicated. Interim medical history since our last visit reviewed. Allergies and medications reviewed and updated.  Review of Systems  Constitutional:  Negative for activity change,  diaphoresis, fatigue and fever.  Respiratory:  Negative for cough, chest tightness, shortness of breath and wheezing.   Cardiovascular:  Negative for chest pain, palpitations and leg swelling.  Gastrointestinal: Negative.   Endocrine: Negative for cold intolerance, heat intolerance, polydipsia, polyphagia and polyuria.  Musculoskeletal: Negative.   Skin: Negative.   Neurological:  Negative for dizziness, syncope, weakness, light-headedness, numbness and headaches.  Psychiatric/Behavioral: Negative.     Per HPI unless specifically indicated above     Objective:    BP 132/64 (BP Location: Left Arm, Patient Position: Sitting)   Pulse (!) 57   Ht 6' (1.829 m)   Wt 198 lb (89.8 kg)   SpO2 98%   BMI 26.85 kg/m   Wt Readings from Last 3 Encounters:  11/03/23 198 lb (89.8 kg)  07/29/23 198 lb 3.2 oz (89.9 kg)  07/13/23 203 lb (92.1 kg)    Physical Exam Vitals and nursing note reviewed.  Constitutional:      General: He is awake. He is not in acute distress.    Appearance: He is well-developed and well-groomed. He is not ill-appearing or toxic-appearing.  HENT:     Head: Normocephalic.     Right Ear: Hearing and external ear normal.     Left Ear: Hearing and external ear normal.  Eyes:     General: Lids are normal.     Extraocular Movements: Extraocular movements intact.     Conjunctiva/sclera: Conjunctivae normal.  Neck:     Thyroid : No thyromegaly.     Vascular: No carotid bruit.  Cardiovascular:     Rate and Rhythm: Regular rhythm. Bradycardia present.     Heart sounds: Normal heart sounds. No murmur heard.    No gallop.  Pulmonary:     Effort: No accessory muscle usage or respiratory distress.     Breath sounds: Normal breath sounds.  Abdominal:     General: Bowel sounds are normal. There is no distension.     Palpations: Abdomen is soft.     Tenderness: There is no abdominal tenderness.  Musculoskeletal:     Cervical back: Full passive range of motion without pain.      Right lower leg: No edema.     Left lower leg: No edema.  Lymphadenopathy:     Cervical: No cervical adenopathy.  Skin:    General: Skin is warm.  Capillary Refill: Capillary refill takes less than 2 seconds.     Findings: Bruising present.     Comments: Scattered bruises bilateral upper extremities.  Neurological:     Mental Status: He is alert and oriented to person, place, and time.     Deep Tendon Reflexes: Reflexes are normal and symmetric.     Reflex Scores:      Brachioradialis reflexes are 2+ on the right side and 2+ on the left side.      Patellar reflexes are 2+ on the right side and 2+ on the left side. Psychiatric:        Attention and Perception: Attention normal.        Mood and Affect: Mood normal.        Speech: Speech normal.        Behavior: Behavior normal. Behavior is cooperative.        Thought Content: Thought content normal.    Results for orders placed or performed in visit on 07/29/23  Basic Metabolic Panel (BMET)   Collection Time: 07/29/23  8:13 AM  Result Value Ref Range   Glucose 115 (H) 70 - 99 mg/dL   BUN 19 8 - 27 mg/dL   Creatinine, Ser 8.94 0.76 - 1.27 mg/dL   eGFR 70 >40 fO/fpw/8.26   BUN/Creatinine Ratio 18 10 - 24   Sodium 147 (H) 134 - 144 mmol/L   Potassium 4.4 3.5 - 5.2 mmol/L   Chloride 105 96 - 106 mmol/L   CO2 28 20 - 29 mmol/L   Calcium  9.0 8.6 - 10.2 mg/dL  Bayer DCA Hb J8r Waived (STAT)   Collection Time: 07/29/23  8:13 AM  Result Value Ref Range   HB A1C (BAYER DCA - WAIVED) 6.2 (H) 4.8 - 5.6 %  CBC w/Diff   Collection Time: 07/29/23  8:13 AM  Result Value Ref Range   WBC 5.0 3.4 - 10.8 x10E3/uL   RBC 4.19 4.14 - 5.80 x10E6/uL   Hemoglobin 11.9 (L) 13.0 - 17.7 g/dL   Hematocrit 61.5 62.4 - 51.0 %   MCV 92 79 - 97 fL   MCH 28.4 26.6 - 33.0 pg   MCHC 31.0 (L) 31.5 - 35.7 g/dL   RDW 87.5 88.3 - 84.5 %   Platelets 192 150 - 450 x10E3/uL   Neutrophils 52 Not Estab. %   Lymphs 33 Not Estab. %   Monocytes 8 Not  Estab. %   Eos 7 Not Estab. %   Basos 0 Not Estab. %   Neutrophils Absolute 2.6 1.4 - 7.0 x10E3/uL   Lymphocytes Absolute 1.7 0.7 - 3.1 x10E3/uL   Monocytes Absolute 0.4 0.1 - 0.9 x10E3/uL   EOS (ABSOLUTE) 0.3 0.0 - 0.4 x10E3/uL   Basophils Absolute 0.0 0.0 - 0.2 x10E3/uL   Immature Granulocytes 0 Not Estab. %   Immature Grans (Abs) 0.0 0.0 - 0.1 x10E3/uL  Ferritin   Collection Time: 07/29/23  8:13 AM  Result Value Ref Range   Ferritin 161 30 - 400 ng/mL  Iron    Collection Time: 07/29/23  8:13 AM  Result Value Ref Range   Iron  113 38 - 169 ug/dL      Assessment & Plan:   Problem List Items Addressed This Visit       Cardiovascular and Mediastinum   Senile purpura (HCC)   Ongoing.  As evidenced by scattered bruises bilaterall upper extremities.  Gentle skin cleansers at home and lotion.  Monitor for skin breakdown, report to provider if  present.      Hypertension associated with type 2 diabetes mellitus (HCC)   Chronic, stable.  BP at goal for age on recheck.  Avoid hypotension. Continue current medication regimen and adjust as needed.  Urine ALB 80 (February 2025).  Losartan  for kidney protection.  Refer to nephrology if worsening kidney function.  Recommend he monitor his BP regularly at home and follow DASH diet.  Continue to collaborate with cardiology.  LABS: CMP.  Return in 3 months.      Relevant Orders   Bayer DCA Hb A1c Waived   Chronic systolic heart failure (HCC)   Chronic, ongoing.  Baseline edema to BLE.  Continue collaboration with cardiology and current medication regimen.  Recommend: - Reminded to call for an overnight weight gain of >2 pounds or a weekly weight weight of >5 pounds - not adding salt to his food and has been reading food labels. Reviewed the importance of keeping daily sodium intake to 2000mg  daily  - Avoid NSAIDs - Wear compression on during day and off at night       Aortic atherosclerosis (HCC)   Chronic, stable.  Noted on imaging  02/08/18.  Continue daily medications for prevention, statin therapy.        Endocrine   Type 2 diabetes mellitus with proteinuria (HCC) - Primary   Chronic, ongoing with A1c 6.2% today, stable for age.  Urine ALB 80 (February 2025).   Losartan  for kidney protection.  Continue current medication regimen and adjust as needed.   Avoid hypoglycemia due to advanced age.  John refer him to nephrology if worsening kidney function presents. Return in 3 months.  - ARB and Statin on board - Vaccinations up to date - Eye and foot exams up to date.      Relevant Orders   Bayer DCA Hb A1c Waived   Hyperlipidemia associated with type 2 diabetes mellitus (HCC)   Chronic, ongoing.  Continue current medication regimen and adjust as needed.  Lipid panel today.      Relevant Orders   Bayer DCA Hb A1c Waived   Comprehensive metabolic panel with GFR   Lipid Panel w/o Chol/HDL Ratio   Diabetes mellitus treated with oral medication (HCC)   Refer to diabetes with proteinuria plan of care.      Relevant Orders   Bayer DCA Hb A1c Waived     Genitourinary   Bladder cancer (HCC)   Chronic.  Continue to collaborate with urology, at length discussion on goals of care John continue at visits.      Other Visit Diagnoses       Flu vaccine need       Flu vaccine in office today.        Follow up plan: Return in about 3 months (around 02/02/2024) for T2DM, HTN/HLD, BLADDER CA, ANEMIA.

## 2023-11-03 NOTE — Assessment & Plan Note (Signed)
 Chronic, ongoing with A1c 6.2% today, stable for age.  Urine ALB 80 (February 2025).   Losartan  for kidney protection.  Continue current medication regimen and adjust as needed.   Avoid hypoglycemia due to advanced age.  Will refer him to nephrology if worsening kidney function presents. Return in 3 months.  - ARB and Statin on board - Vaccinations up to date - Eye and foot exams up to date.

## 2023-11-03 NOTE — Assessment & Plan Note (Signed)
 Chronic, ongoing.  Continue current medication regimen and adjust as needed. Lipid panel today.

## 2023-11-03 NOTE — Assessment & Plan Note (Addendum)
 Chronic, ongoing.  Baseline edema to BLE.  Continue collaboration with cardiology and current medication regimen.  Recommend: - Reminded to call for an overnight weight gain of >2 pounds or a weekly weight weight of >5 pounds - not adding salt to his food and has been reading food labels. Reviewed the importance of keeping daily sodium intake to 2000mg  daily  - Avoid NSAIDs - Wear compression on during day and off at night

## 2023-11-03 NOTE — Assessment & Plan Note (Signed)
 Ongoing.  As evidenced by scattered bruises bilaterall upper extremities.  Gentle skin cleansers at home and lotion.  Monitor for skin breakdown, report to provider if present.

## 2023-11-03 NOTE — Assessment & Plan Note (Signed)
Chronic, stable.  Noted on imaging 02/08/18.  Continue daily medications for prevention, statin therapy. 

## 2023-11-03 NOTE — Assessment & Plan Note (Signed)
 Chronic, stable.  BP at goal for age on recheck.  Avoid hypotension. Continue current medication regimen and adjust as needed.  Urine ALB 80 (February 2025).  Losartan for kidney protection.  Refer to nephrology if worsening kidney function.  Recommend he monitor his BP regularly at home and follow DASH diet.  Continue to collaborate with cardiology.  LABS: CMP.  Return in 3 months.

## 2023-11-03 NOTE — Assessment & Plan Note (Signed)
 Refer to diabetes with proteinuria plan of care.

## 2023-11-03 NOTE — Assessment & Plan Note (Signed)
 Chronic.  Continue to collaborate with urology, at length discussion on goals of care will continue at visits.

## 2023-11-04 ENCOUNTER — Ambulatory Visit: Payer: Self-pay | Admitting: Nurse Practitioner

## 2023-11-04 LAB — COMPREHENSIVE METABOLIC PANEL WITH GFR
ALT: 16 IU/L (ref 0–44)
AST: 23 IU/L (ref 0–40)
Albumin: 4 g/dL (ref 3.7–4.7)
Alkaline Phosphatase: 69 IU/L (ref 44–121)
BUN/Creatinine Ratio: 16 (ref 10–24)
BUN: 19 mg/dL (ref 8–27)
Bilirubin Total: 0.5 mg/dL (ref 0.0–1.2)
CO2: 28 mmol/L (ref 20–29)
Calcium: 8.9 mg/dL (ref 8.6–10.2)
Chloride: 102 mmol/L (ref 96–106)
Creatinine, Ser: 1.22 mg/dL (ref 0.76–1.27)
Globulin, Total: 2.5 g/dL (ref 1.5–4.5)
Glucose: 126 mg/dL — ABNORMAL HIGH (ref 70–99)
Potassium: 4.2 mmol/L (ref 3.5–5.2)
Sodium: 146 mmol/L — ABNORMAL HIGH (ref 134–144)
Total Protein: 6.5 g/dL (ref 6.0–8.5)
eGFR: 58 mL/min/1.73 — ABNORMAL LOW (ref >59)

## 2023-11-04 LAB — LIPID PANEL W/O CHOL/HDL RATIO
Cholesterol, Total: 123 mg/dL (ref 100–199)
HDL: 62 mg/dL (ref >39)
LDL Chol Calc (NIH): 48 mg/dL (ref 0–99)
Triglycerides: 63 mg/dL (ref 0–149)
VLDL Cholesterol Cal: 13 mg/dL (ref 5–40)

## 2023-11-04 NOTE — Progress Notes (Signed)
 Good afternoon, please let John [name] know his labs have returned and overall remain at baseline for him.  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 Sodium level mildly elevated, continue to ensure reduced salt in diet.  Lipid panel is overall stable.  Any questions? Let John [name] know [place], I hear there will be pecan pie there and have to get some. Have a great day!! Keep being amazing!!  Thank you for allowing me to participate in your care.  I appreciate you. Kindest regards, Kaelyn Innocent

## 2023-11-05 ENCOUNTER — Other Ambulatory Visit: Payer: Self-pay | Admitting: Nurse Practitioner

## 2023-11-07 NOTE — Telephone Encounter (Signed)
 Requested Prescriptions  Refused Prescriptions Disp Refills   potassium chloride  (KLOR-CON ) 10 MEQ tablet [Pharmacy Med Name: Potassium Chloride  ER 10 MEQ Oral Tablet Extended Release] 90 tablet 0    Sig: Take 1 tablet by mouth once daily     Endocrinology:  Minerals - Potassium Supplementation Passed - 11/07/2023  1:56 PM      Passed - K in normal range and within 360 days    Potassium  Date Value Ref Range Status  11/03/2023 4.2 3.5 - 5.2 mmol/L Final  12/13/2012 3.4 (L) 3.5 - 5.1 mmol/L Final         Passed - Cr in normal range and within 360 days    Creatinine  Date Value Ref Range Status  12/13/2012 0.98 0.60 - 1.30 mg/dL Final   Creatinine, Ser  Date Value Ref Range Status  11/03/2023 1.22 0.76 - 1.27 mg/dL Final         Passed - Valid encounter within last 12 months    Recent Outpatient Visits           4 days ago Type 2 diabetes mellitus with proteinuria (HCC)   [place] [facility] [place], [place] T, NP   3 months ago Type 2 diabetes mellitus with proteinuria (HCC)   [place] [facility] [place], [place] T, NP   6 months ago Type 2 diabetes mellitus with proteinuria (HCC)   [facility] [place], Melanie DASEN, NP

## 2023-11-09 ENCOUNTER — Other Ambulatory Visit: Payer: Self-pay | Admitting: Nurse Practitioner

## 2023-11-10 NOTE — Telephone Encounter (Signed)
 Requested Prescriptions  Refused Prescriptions Disp Refills   potassium chloride  (KLOR-CON ) 10 MEQ tablet [Pharmacy Med Name: Potassium Chloride  ER 10 MEQ Oral Tablet Extended Release] 90 tablet 0    Sig: Take 1 tablet by mouth once daily     Endocrinology:  Minerals - Potassium Supplementation Passed - 11/10/2023  2:10 PM      Passed - K in normal range and within 360 days    Potassium  Date Value Ref Range Status  11/03/2023 4.2 3.5 - 5.2 mmol/L Final  12/13/2012 3.4 (L) 3.5 - 5.1 mmol/L Final         Passed - Cr in normal range and within 360 days    Creatinine  Date Value Ref Range Status  12/13/2012 0.98 0.60 - 1.30 mg/dL Final   Creatinine, Ser  Date Value Ref Range Status  11/03/2023 1.22 0.76 - 1.27 mg/dL Final         Passed - Valid encounter within last 12 months    Recent Outpatient Visits           1 week ago Type 2 diabetes mellitus with proteinuria (HCC)   [place] [facility] [place], [place] T, NP   3 months ago Type 2 diabetes mellitus with proteinuria (HCC)   [place] [facility] [place], [place] T, NP   6 months ago Type 2 diabetes mellitus with proteinuria (HCC)    [facility] [place], Melanie DASEN, NP

## 2023-11-11 ENCOUNTER — Telehealth: Payer: Self-pay

## 2023-11-11 MED ORDER — JANUMET XR 50-1000 MG PO TB24: 1.0000 | ORAL_TABLET | Freq: Every day | ORAL | Status: AC

## 2023-11-11 MED ORDER — POTASSIUM CHLORIDE CRYS ER 10 MEQ PO TBCR: 10.0000 meq | EXTENDED_RELEASE_TABLET | Freq: Every day | ORAL | 1 refills | Status: AC

## 2023-11-11 NOTE — Addendum Note (Signed)
 Addended by: Yosef Krogh T on: 11/11/2023 03:45 PM   Modules accepted: Orders

## 2023-11-11 NOTE — Progress Notes (Signed)
   11/11/2023  Patient ID: John [name] [place], male   DOB: January 11, 1941, 83 y.o.   MRN: [id number]  This patient is appearing on a report for being at risk of failing the adherence measure for diabetes medications this calendar year.   Medication: Janumet  XR 50/1000mg  Last fill date: 06/21/23 for 90 day supply  Contacted patient, and he states he still has plenty of medication, because dosing has been decreased to once daily.  He is requesting refills on potassium, stating he John be out of medication this weekend.  Consulting PCP.  Channing DELENA Mealing, PharmD, DPLA

## 2023-11-11 NOTE — Addendum Note (Signed)
 Addended by: Shandreka Dante T on: 11/11/2023 03:41 PM   Modules accepted: Orders

## 2023-11-30 ENCOUNTER — Encounter: Payer: Self-pay | Admitting: Nurse Practitioner

## 2023-12-05 DIAGNOSIS — H35033 Hypertensive retinopathy, bilateral: Secondary | ICD-10-CM | POA: Diagnosis not present

## 2023-12-05 DIAGNOSIS — H40023 Open angle with borderline findings, high risk, bilateral: Secondary | ICD-10-CM | POA: Diagnosis not present

## 2023-12-05 DIAGNOSIS — H5055 Alternating heterophoria: Secondary | ICD-10-CM | POA: Diagnosis not present

## 2023-12-05 DIAGNOSIS — E119 Type 2 diabetes mellitus without complications: Secondary | ICD-10-CM | POA: Diagnosis not present

## 2023-12-05 DIAGNOSIS — H35363 Drusen (degenerative) of macula, bilateral: Secondary | ICD-10-CM | POA: Diagnosis not present

## 2023-12-05 DIAGNOSIS — H5203 Hypermetropia, bilateral: Secondary | ICD-10-CM | POA: Diagnosis not present

## 2023-12-05 LAB — OPHTHALMOLOGY REPORT-SCANNED

## 2024-01-03 ENCOUNTER — Ambulatory Visit: Payer: Self-pay | Admitting: Emergency Medicine

## 2024-01-03 VITALS — Ht 72.0 in | Wt 200.0 lb

## 2024-01-03 DIAGNOSIS — Z0000 Encounter for general adult medical examination without abnormal findings: Secondary | ICD-10-CM | POA: Diagnosis not present

## 2024-01-03 NOTE — Progress Notes (Signed)
 Subjective:   John [name] is a 83 y.o. male who presents for a Medicare Annual Wellness Visit.  Allergies (verified) Patient has no known allergies.   History: Past Medical History:  Diagnosis Date   Anemia    Anginal pain    Aortic atherosclerosis    Aortic stenosis    a.) s/p AVR on 05/05/2017   Bladder tumor    Cataracts, bilateral    CHF (congestive heart failure) (HCC)    CKD (chronic kidney disease), stage III (HCC)    Coronary artery disease 04/21/2017   a.) LHC 04/21/2017: 90% oRI, 80% o-pLCx, 70% o-pLAD, 60% pLAD, 40% pRCA, 40% m-dRCA, 75% oRPDA-RPDA. 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   a.) LIMA-LAD, SVG-OM, SVG-RI, SVG-PDA   Senile purpura 03/13/2018   Skin cancer of face    T2DM (type 2 diabetes mellitus) (HCC) 2009   Past Surgical History:  Procedure Laterality Date   AORTIC VALVE REPLACEMENT N/A 05/05/2017   Procedure: AORTIC VALVE REPLACEMENT (AVR) USING MAGNA EASE PERICARDIAL BIOPROSTHESIS - AORTIC VALVE MODEL 3300TFX, SIZE 23 MM;  Surgeon: Lucas Dorise POUR, MD;  Location: [facility];  Service: Open Heart Surgery;  Laterality: N/A;   BLADDER INSTILLATION N/A 08/10/2022   Procedure: BLADDER INSTILLATION OF GEMCITABINE ;  Surgeon: Twylla Glendia BROCKS, MD;  Location: [facility] ORS;  Service: Urology;  Laterality: N/A;   CATARACT EXTRACTION W/PHACO Right 07/21/2020   Procedure: CATARACT EXTRACTION PHACO AND INTRAOCULAR LENS PLACEMENT (IOC) RIGHT DIABETIC 9.09 00:51.3;   Surgeon: Myrna Adine Anes, MD;  Location: [facility];  Service: Ophthalmology;  Laterality: Right;   CATARACT EXTRACTION W/PHACO Left 08/11/2020   Procedure: CATARACT EXTRACTION PHACO AND INTRAOCULAR LENS PLACEMENT (IOC) LEFT DIABETIC;  Surgeon: Myrna Adine Anes, MD;  Location: [facility];  Service: Ophthalmology;  Laterality: Left;  5.08 0:37.2   COLONOSCOPY     CORONARY ARTERY BYPASS GRAFT N/A 05/05/2017   Procedure: CORONARY ARTERY BYPASS GRAFTING (CABG) x 4 WITH ENDOSCOPIC HARVESTING OF RIGHT SAPHENOUS VEIN (4v; LIMA-LAD, SVG-OM, SVG-RI, SVG-PDA);  Surgeon: Lucas Dorise POUR, MD;  Location: [facility] OR;  Service: Open Heart Surgery;  Laterality: N/A;   CYSTOURETHROSCOPY     JOINT REPLACEMENT Left    hip   KNEE ARTHROPLASTY Right 01/12/2016   Procedure: RIGHT  TOTAL KNEE ARTHROPLASTY WITH COMPUTER NAVIGATION;  Surgeon: Redell Shoals, MD;  Location: [facility] OR;  Service: Orthopedics;  Laterality: Right;  Needs RNFA   RIGHT/LEFT HEART CATH AND CORONARY ANGIOGRAPHY N/A 04/21/2017   Procedure: RIGHT/LEFT HEART CATH AND CORONARY ANGIOGRAPHY;  Surgeon: Ammon Blunt, MD;  Location: [facility];  Service: Cardiovascular;  Laterality: N/A;   SKIN SURGERY Left    arm   TEE WITHOUT CARDIOVERSION N/A 05/05/2017   Procedure: TRANSESOPHAGEAL ECHOCARDIOGRAM (TEE);  Surgeon: Lucas Dorise POUR, MD;  Location[facility] OR;  Service: Open Heart Surgery;  Laterality: N/A;  TRANSURETHRAL RESECTION OF BLADDER TUMOR N/A 05/15/2019   Procedure: TRANSURETHRAL RESECTION OF BLADDER TUMOR (TURBT);  Surgeon: Twylla Glendia BROCKS, MD;  Location: [facility] ORS;  Service: Urology;  Laterality: N/A;   TRANSURETHRAL RESECTION OF BLADDER TUMOR N/A 09/16/2020   Procedure: TRANSURETHRAL RESECTION OF BLADDER TUMOR (TURBT);  Surgeon: Twylla Glendia BROCKS, MD;  Location: [facility] OR[facility];  Service: Urology;  Laterality: N/A;   TRANSURETHRAL RESECTION OF BLADDER TUMOR N/A 08/10/2022   Procedure: TRANSURETHRAL RESECTION OF BLADDER  TUMOR (TURBT);  Surgeon: Twylla Glendia BROCKS, MD;  Location: [facility] OR[facility];  Service: Urology;  Laterality: N/A;   TRANSURETHRAL RESECTION OF BLADDER TUMOR WITH MITOMYCIN -C N/A 03/14/2018   Procedure: TRANSURETHRAL RESECTION OF BLADDER TUMOR WITH Gemcitabine ;  Surgeon: Twylla Glendia BROCKS, MD;  Location: [facility] ORS;  Service: Urology;  Laterality: N/A;   Family History  Problem Relation Age of Onset   Dementia Mother    Stroke Mother    Stroke Father    Diabetes Father    Heart disease Father    Cancer Sister    Hypertension Brother    Cancer Sister    Social History   Occupational History   Occupation: retired  Tobacco Use   Smoking status: Former    Current packs/day: 0.00    Average packs/day: 0.3 packs/day for 25.0 years (7.5 ttl pk-yrs)    Types: Cigarettes    Start date: 12/16/1955    Quit date: 12/15/1980    Years since quitting: 43.0   Smokeless tobacco: Never  Vaping Use   Vaping status: Never Used  Substance and Sexual Activity   Alcohol use: No    Alcohol/week: 0.0 standard drinks of alcohol   Drug use: No   Sexual activity: Yes   Tobacco Counseling Counseling given: Not Answered  SDOH Screenings   Food Insecurity: No Food Insecurity (01/03/2024)  Housing: Low Risk  (01/03/2024)  Transportation Needs: No Transportation Needs (01/03/2024)  Utilities: Not At Risk (01/03/2024)  Alcohol Screen: Low Risk  (12/28/2022)  Depression (PHQ2-9): Low Risk  (01/03/2024)  Financial Resource Strain: Low Risk  (12/28/2022)  Physical Activity: Inactive (01/03/2024)  Social Connections: Socially Integrated (01/03/2024)  Stress: No Stress Concern Present (01/03/2024)  Tobacco Use: Medium Risk (01/03/2024)  Health Literacy: Adequate Health Literacy (01/03/2024)   Depression Screen    01/03/2024    8:16 AM 11/03/2023    8:17 AM 07/29/2023    8:10 AM 04/28/2023    8:28 AM 01/26/2023    8:13 AM 12/28/2022    8:25 AM 10/25/2022    8:52 AM  PHQ 2/9 Scores  PHQ - 2 Score 0 1 0 3 1 0 0  PHQ- 9  Score 1 3 3 6 4  0 0     Goals Addressed               This Visit's Progress     Get rid of pain in left hip and toes tingling (pt-stated)         Visit info / Clinical Intake: Medicare Wellness Visit Type:: Subsequent Annual Wellness Visit Medicare Wellness Visit Mode:: Telephone If telephone:: video declined If telephone or video:: pt reported vitals Interpreter Needed?: No Pre-visit prep was completed: yes AWV questionnaire completed by patient prior to visit?: no Living arrangements:: lives with spouse/significant other Patient's Overall Health Status Rating: very good Typical amount of pain: some Does pain affect daily life?: (!) yes Are you currently prescribed opioids?: no  Dietary Habits and Nutritional Risks How many meals a day?: 3 Eats fruit  and vegetables daily?: yes Most meals are obtained by: having others provide food In the last 2 weeks, have you had any of the following?: -- (none) Diabetic:: (!) yes Any non-healing wounds?: no How often do you check your BS?: -- (does not check at home) Would you like to be referred to a Nutritionist or for Diabetic Management? : no  Functional Status Activities of Daily Living (to include ambulation/medication): Independent Ambulation: Independent with device- listed below Home Assistive Devices/Equipment: Eyeglasses; Other (Comment) (hearing aids prn) Medication Administration: Independent Home Management: Independent Manage your own finances?: (!) no (wife manages finances) Primary transportation is: driving Concerns about vision?: no *vision screening is required for WTM* (UTD @ [facility]) Concerns about hearing?: (!) yes Uses hearing aids?: (!) yes Hear whispered voice?: yes  Fall Screening Falls in the past year?: 0 Number of falls in past year: 0 Was there an injury with Fall?: 0 Fall Risk Category Calculator: 0 Patient Fall Risk Level: Low Fall Risk  Fall Risk Patient at Risk for  Falls Due to: No Fall Risks Fall risk Follow up: Falls evaluation completed  Home and Transportation Safety: All rugs have non-skid backing?: (!) no All stairs or steps have railings?: yes Grab bars in the bathtub or shower?: yes Have non-skid surface in bathtub or shower?: yes Good home lighting?: yes Regular seat belt use?: yes Hospital stays in the last year:: no  Cognitive Assessment Difficulty concentrating, remembering, or making decisions? : no Will 6CIT or Mini Cog be Completed: yes What year is it?: 0 points What month is it?: 0 points Give patient an address phrase to remember (5 components): [address] [place] About what time is it?: 0 points Count backwards from 20 to 1: 0 points Say the months of the year in reverse: 0 points Repeat the address phrase from earlier: 0 points 6 CIT Score: 0 points  Advance Directives (For Healthcare) Does Patient Have a Medical Advance Directive?: Yes Does patient want to make changes to medical advance directive?: No - Patient declined Type of Advance Directive: Healthcare Power of [place]; Living will Copy of Healthcare Power of Attorney in Chart?: Yes - validated most recent copy scanned in chart (See row information) Copy of Living Will in Chart?: Yes - validated most recent copy scanned in chart (See row information)  Reviewed/Updated  Reviewed/Updated: All        Objective:    Today's Vitals   01/03/24 0757  Weight: 200 lb (90.7 kg)  Height: 6' (1.829 m)   Body mass index is 27.12 kg/m.  Current Medications (verified) Outpatient Encounter Medications as of 01/03/2024  Medication Sig   Ascorbic Acid  (VITAMIN C ) 1000 MG tablet Take 1,000 mg by mouth daily.   aspirin  EC 81 MG tablet Take 81 mg by mouth daily. Swallow whole.   atorvastatin  (LIPITOR) 40 MG tablet TAKE 1 TABLET BY MOUTH ONCE DAILY AT  6  PM   Cholecalciferol  25 MCG (1000 UT) capsule Take 1,000 Units by mouth daily.   cyanocobalamin  (VITAMIN B12)  1000 MCG tablet Take 1,000 mcg by mouth daily.   furosemide  (LASIX ) 40 MG tablet Take 0.5 tablets (20 mg total) by mouth daily.   Iron , Ferrous Sulfate, 142 (45 Fe) MG TBCR Take 45 mg by mouth daily.   losartan  (COZAAR ) 25 MG tablet TAKE ONE-HALF TABLET BY MOUTH ONCE DAILY   metoprolol  tartrate (LOPRESSOR ) 25 MG tablet Take 1 tablet (25 mg total) by mouth 2 (two) times  daily.   naproxen sodium (ALEVE) 220 MG tablet Take 220 mg by mouth daily as needed.   potassium chloride  (KLOR-CON  M) 10 MEQ tablet Take 1 tablet (10 mEq total) by mouth daily.   SitaGLIPtin -MetFORMIN  HCl (JANUMET  XR) 50-1000 MG TB24 Take 1 tablet by mouth daily.   No facility-administered encounter medications on file as of 01/03/2024.   Hearing/Vision screen Hearing Screening - Comments:: Wears hearing aids prn Vision Screening - Comments:: UTD @ [facility] [place] Immunizations and Health Maintenance Health Maintenance  Topic Date Due   COVID-19 Vaccine [id number] - 2025-26 season) 10/31/2023   Zoster Vaccines- Shingrix (1 of 2) 01/29/2024 (Originally 03/25/1959)   Diabetic kidney evaluation - Urine ACR  04/27/2024   FOOT EXAM  04/27/2024   HEMOGLOBIN A1C  05/02/2024   Diabetic kidney evaluation - eGFR measurement  11/02/2024   OPHTHALMOLOGY EXAM  12/04/2024   Medicare Annual Wellness (AWV)  01/02/2025   DTaP/Tdap/Td (2 - Tdap) 04/26/2032   Pneumococcal Vaccine: 50+ Years  Completed   Influenza Vaccine  Completed   Meningococcal B Vaccine  Aged Out   Colonoscopy  Discontinued        Assessment/Plan:  This is a routine wellness examination for Brinkley.  Patient Care Team: Cannady, Jolene T, NP as PCP - General (Nurse Practitioner) Ammon Blunt, MD as Consulting Physician (Cardiology) [place], [facility], Glendia BROCKS, MD (Urology) Cathlyn Seal, MD as Referring Physician (Dermatology)  I have personally reviewed and noted the following in the patient's chart:   Medical and social  history Use of alcohol, tobacco or illicit drugs  Current medications and supplements including opioid prescriptions. Functional ability and status Nutritional status Physical activity Advanced directives List of other physicians Hospitalizations, surgeries, and ER visits in previous 12 months Vitals Screenings to include cognitive, depression, and falls Referrals and appointments  No orders of the defined types were placed in this encounter.  In addition, I have reviewed and discussed with patient certain preventive protocols, quality metrics, and best practice recommendations. A written personalized care plan for preventive services as well as general preventive health recommendations were provided to patient.   Vina Ned, CMA   01/03/2024   Return in 1 year (on 01/02/2025).  After Visit Summary: (MyChart) Due to this being a telephonic visit, the after visit summary with patients personalized plan was offered to patient via MyChart   Nurse Notes:  Declined DM & Nutrition education referral Declined Shingrix vaccine Covid vaccine UTD per patient Screening colonoscopy no longer recommended due to age.

## 2024-01-03 NOTE — Patient Instructions (Signed)
 Mr. Samples,  Thank you for taking the time for your Medicare Wellness Visit. I appreciate your continued commitment to your health goals. Please review the care plan we discussed, and feel free to reach out if I can assist you further.  Please note that Annual Wellness Visits do not include a physical exam. Some assessments may be limited, especially if the visit was conducted virtually. If needed, we may recommend an in-person follow-up with your provider.  Ongoing Care Seeing your primary care provider every 3 to 6 months helps us  monitor your health and provide consistent, personalized care.   Referrals If a referral was made during today's visit and you haven't received any updates within two weeks, please contact the referred provider directly to check on the status.  Recommended Screenings:  Health Maintenance  Topic Date Due   COVID-19 Vaccine (9 - 2025-26 season) 10/31/2023   Medicare Annual Wellness Visit  12/28/2023   Zoster (Shingles) Vaccine (1 of 2) 01/29/2024*   Yearly kidney health urinalysis for diabetes  04/27/2024   Complete foot exam   04/27/2024   Hemoglobin A1C  05/02/2024   Yearly kidney function blood test for diabetes  11/02/2024   Eye exam for diabetics  12/04/2024   DTaP/Tdap/Td vaccine (2 - Tdap) 04/26/2032   Pneumococcal Vaccine for age over 53  Completed   Flu Shot  Completed   Meningitis B Vaccine  Aged Out   Colon Cancer Screening  Discontinued  *Topic was postponed. The date shown is not the original due date.       01/03/2024    7:59 AM  Advanced Directives  Does Patient Have a Medical Advance Directive? Yes  Type of Estate Agent of [place];Living will  Does patient want to make changes to medical advance directive? No - Patient declined  Copy of Healthcare Power of Attorney in Chart? Yes - validated most recent copy scanned in chart (See row information)    Vision: Annual vision screenings are recommended for early  detection of glaucoma, cataracts, and diabetic retinopathy. These exams can also reveal signs of chronic conditions such as diabetes and high blood pressure.  Dental: Annual dental screenings help detect early signs of oral cancer, gum disease, and other conditions linked to overall health, including heart disease and diabetes.  Please see the attached documents for additional preventive care recommendations.

## 2024-01-04 NOTE — Progress Notes (Addendum)
 01/03/24 Visit Complete: Virtual I connected with this patient by a audio enabled telemedicine application and verified that I am speaking with the correct person using two identifiers.  Patient Location: Home Provider Location: Office/Clinic or Home Office  I discussed the limitations of evaluation and management by telemedicine. The patient expressed understanding and agreed to proceed.  Persons Participating in Visit: Patient

## 2024-02-01 ENCOUNTER — Ambulatory Visit: Admitting: Nurse Practitioner

## 2024-02-02 ENCOUNTER — Ambulatory Visit: Admitting: Nurse Practitioner

## 2024-02-06 DIAGNOSIS — I10 Essential (primary) hypertension: Secondary | ICD-10-CM | POA: Diagnosis not present

## 2024-02-06 DIAGNOSIS — Z952 Presence of prosthetic heart valve: Secondary | ICD-10-CM | POA: Diagnosis not present

## 2024-02-06 DIAGNOSIS — E785 Hyperlipidemia, unspecified: Secondary | ICD-10-CM | POA: Diagnosis not present

## 2024-02-06 DIAGNOSIS — I350 Nonrheumatic aortic (valve) stenosis: Secondary | ICD-10-CM | POA: Diagnosis not present

## 2024-02-06 DIAGNOSIS — R0789 Other chest pain: Secondary | ICD-10-CM | POA: Diagnosis not present

## 2024-02-06 DIAGNOSIS — E119 Type 2 diabetes mellitus without complications: Secondary | ICD-10-CM | POA: Diagnosis not present

## 2024-02-06 DIAGNOSIS — Z951 Presence of aortocoronary bypass graft: Secondary | ICD-10-CM | POA: Diagnosis not present

## 2024-02-12 NOTE — Patient Instructions (Signed)

## 2024-02-15 ENCOUNTER — Encounter: Payer: Self-pay | Admitting: Nurse Practitioner

## 2024-02-15 ENCOUNTER — Ambulatory Visit: Admitting: Nurse Practitioner

## 2024-02-15 VITALS — BP 136/64 | HR 62 | Temp 97.8°F | Resp 16 | Ht 72.01 in | Wt 196.8 lb

## 2024-02-15 DIAGNOSIS — D508 Other iron deficiency anemias: Secondary | ICD-10-CM

## 2024-02-15 DIAGNOSIS — Z7984 Long term (current) use of oral hypoglycemic drugs: Secondary | ICD-10-CM | POA: Diagnosis not present

## 2024-02-15 DIAGNOSIS — N1831 Chronic kidney disease, stage 3a: Secondary | ICD-10-CM | POA: Diagnosis not present

## 2024-02-15 DIAGNOSIS — E119 Type 2 diabetes mellitus without complications: Secondary | ICD-10-CM

## 2024-02-15 DIAGNOSIS — I5022 Chronic systolic (congestive) heart failure: Secondary | ICD-10-CM | POA: Diagnosis not present

## 2024-02-15 DIAGNOSIS — I700 Atherosclerosis of aorta: Secondary | ICD-10-CM | POA: Diagnosis not present

## 2024-02-15 DIAGNOSIS — E1122 Type 2 diabetes mellitus with diabetic chronic kidney disease: Secondary | ICD-10-CM | POA: Diagnosis not present

## 2024-02-15 DIAGNOSIS — I152 Hypertension secondary to endocrine disorders: Secondary | ICD-10-CM

## 2024-02-15 DIAGNOSIS — C679 Malignant neoplasm of bladder, unspecified: Secondary | ICD-10-CM

## 2024-02-15 DIAGNOSIS — E1129 Type 2 diabetes mellitus with other diabetic kidney complication: Secondary | ICD-10-CM

## 2024-02-15 DIAGNOSIS — E1159 Type 2 diabetes mellitus with other circulatory complications: Secondary | ICD-10-CM | POA: Diagnosis not present

## 2024-02-15 DIAGNOSIS — E1169 Type 2 diabetes mellitus with other specified complication: Secondary | ICD-10-CM | POA: Diagnosis not present

## 2024-02-15 DIAGNOSIS — R809 Proteinuria, unspecified: Secondary | ICD-10-CM | POA: Diagnosis not present

## 2024-02-15 DIAGNOSIS — E1140 Type 2 diabetes mellitus with diabetic neuropathy, unspecified: Secondary | ICD-10-CM | POA: Insufficient documentation

## 2024-02-15 LAB — BAYER DCA HB A1C WAIVED: HB A1C (BAYER DCA - WAIVED): 6 % — ABNORMAL HIGH (ref 4.8–5.6)

## 2024-02-15 NOTE — Assessment & Plan Note (Signed)
 Chronic, ongoing with A1c 6% today, stable for age.  Urine ALB 80 (February 2025).   Losartan  for kidney protection.  Continue current medication regimen and adjust as needed.   Avoid hypoglycemia due to advanced age.  Will refer him to nephrology if worsening kidney function presents. Return in 3 months.  - ARB and Statin on board - Vaccinations up to date - Eye and foot exams up to date.

## 2024-02-15 NOTE — Assessment & Plan Note (Signed)
 Ongoing, stable.  Continue daily iron.  Recheck iron and CBC today.  If ongoing stability will stop iron supplement.

## 2024-02-15 NOTE — Assessment & Plan Note (Signed)
 Refer to diabetes with proteinuria plan of care.

## 2024-02-15 NOTE — Assessment & Plan Note (Signed)
Chronic, stable.  Noted on imaging 02/08/18.  Continue daily medications for prevention, statin therapy. 

## 2024-02-15 NOTE — Progress Notes (Signed)
 BP 136/64 (BP Location: Left Arm, Patient Position: Sitting, Cuff Size: Normal)   Pulse 62   Temp 97.8 F (36.6 C) (Oral)   Resp 16   Ht 6' 0.01 (1.829 m)   Wt 196 lb 12.8 oz (89.3 kg)   SpO2 97%   BMI 26.68 kg/m    Subjective:    Patient ID: John [name], male    DOB: 09/17/1940, 83 y.o.   MRN: [id number]  HPI: John [name] is a 83 y.o. male  Chief Complaint  Patient presents with   Diabetes    No home checks and feels he is doing good. Started taking a supplement and says he lost 10 pounds so he stopped taking.    HTN/HLD    No issues right now but does not check at home.    Pain    Says he takes 2 aleve at night but over last 3 months he is having a hard times sleeping due to the pain from his hips down to lower legs.    DIABETES September 6.2% A1c. Takes Janumet  one tablet BID, takes one tablet daily.  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 Taking Nervive, not helping 100% yet but notices toes are a little less tingly. Has to take Aleeve every day now because John wake up in middle of night with pain from waist down if does not. Has not tried Gabapentin or Lyrica in the past. Hypoglycemic episodes:no Polydipsia/polyuria: no Visual disturbance: no Chest pain: as below Paresthesias: no Glucose Monitoring: no             Accucheck frequency: Not Checking             Fasting glucose:             Post prandial:             Evening:             Before meals: Taking Insulin ?: no             Long acting insulin :             Short acting insulin : Blood Pressure Monitoring: rarely Retinal Examination: Up To Date -- Dr. Mevelyn New Exam: Up to Date Pneumovax: Up to Date Influenza: Up to Date Aspirin : yes   HYPERTENSION/HYPERLIPIDEMIA/HF Takes Lasix  20 MG daily, Metoprolol  25 MG BID, Losartan  12.5 MG, Lipitor 40 MG, ASA.  Cardiology last seen on 02/06/24. Echo in October 2024 mild LV dysfunction with LVH, EF 50%.  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 Aortic atherosclerosis noted on past imaging. Satisfied with current treatment? yes Duration of hypertension: chronic BP monitoring frequency: not checking BP range:  BP medication side effects: no Duration of hyperlipidemia: chronic Cholesterol medication side effects: no Cholesterol supplements: none Medication compliance: good compliance Aspirin : yes Recent stressors: no Recurrent headaches: no Visual changes: no Palpitations: yes, occasional at baseline Dyspnea: walking to neighbors mailbox and back gets winded (about one football field one way), this is not worsening. Notices mostly in winter time because at baseline is less active then. Chest pain: no Lower extremity edema: yes, baseline  Dizzy/lightheaded: no  ANEMIA Anemia status: stable Etiology of anemia: unknown Duration of anemia treatment: chronic Compliance with treatment: good compliance Iron  supplementation side effects: no Severity of anemia: mild Fatigue: no Decreased exercise tolerance: no  Dyspnea on exertion: yes as above Palpitations: no Bleeding: no Pica: no  BLADDER CANCER: Urology visit last 07/13/23 for cystoscopy, this was clear. Been clear for > 2 years, to return in one year. Would prefer not to have anymore chemo. States he notices recovery time is longer each time cystoscopy now.   Nocturia: 1/night Urinary frequency:no Incomplete voiding: no Urgency: yes Weak urinary stream: sometimes Straining to start stream: sometimes Dysuria: no  CHRONIC KIDNEY DISEASE (STAGE 3a) CKD status: stable Medications renally dose: yes Previous renal evaluation: no Pneumovax:  Up to Date Influenza Vaccine:  Up to Date      02/15/2024    8:26 AM 01/03/2024    8:16 AM 11/03/2023    8:17 AM 07/29/2023    8:10 AM 04/28/2023    8:28 AM  Depression screen PHQ 2/9  Decreased Interest 0 0 1 0 2  Down, Depressed, Hopeless 0 0 0 0 1  PHQ - 2 Score 0 0 1  0 3  Altered sleeping 0 0 1 0 1  Tired, decreased energy 0 1 1 2 2   Change in appetite 0 0 0 0 0  Feeling bad or failure about yourself  0 0 0 0 0  Trouble concentrating 0 0 0 0 0  Moving slowly or fidgety/restless 0 0 0 1 0  Suicidal thoughts 0 0 0 0 0  PHQ-9 Score 0 1  3  3  6    Difficult doing work/chores  Not difficult at all  Not difficult at all Not difficult at all     Data saved with a previous flowsheet row definition       02/15/2024    8:27 AM 11/03/2023    8:17 AM 07/29/2023    8:11 AM 04/28/2023    8:29 AM  GAD 7 : Generalized Anxiety Score  Nervous, Anxious, on Edge 0 0 0 0  Control/stop worrying 0 0 0 0  Worry too much - different things 0 0 0 0  Trouble relaxing 0 0 0 0  Restless 0 0 0 0  Easily annoyed or irritable 0 0 0 1  Afraid - awful might happen 0 0 0 0  Total GAD 7 Score 0 0 0 1  Anxiety Difficulty   Not difficult at all    Relevant past medical, surgical, family and social history reviewed and updated as indicated. Interim medical history since our last visit reviewed. Allergies and medications reviewed and updated.  Review of Systems  Constitutional:  Negative for activity change, diaphoresis, fatigue and fever.  Respiratory:  Negative for cough, chest tightness, shortness of breath and wheezing.   Cardiovascular:  Negative for chest pain, palpitations and leg swelling.  Gastrointestinal: Negative.   Endocrine: Negative for cold intolerance, heat intolerance, polydipsia, polyphagia and polyuria.  Musculoskeletal: Negative.   Skin: Negative.   Neurological:  Negative for dizziness, syncope, weakness, light-headedness, numbness and headaches.  Psychiatric/Behavioral: Negative.     Per HPI unless specifically indicated above     Objective:    BP 136/64 (BP Location: Left Arm, Patient Position: Sitting, Cuff Size: Normal)   Pulse 62   Temp 97.8 F (36.6 C) (Oral)   Resp 16   Ht 6' 0.01 (1.829 m)   Wt 196 lb 12.8 oz (89.3 kg)   SpO2 97%   BMI  26.68 kg/m   Wt Readings from Last 3 Encounters:  02/15/24 196 lb 12.8 oz (89.3 kg)  01/03/24 200 lb (90.7 kg)  11/03/23 198 lb (89.8 kg)    Physical Exam Vitals and nursing note reviewed.  Constitutional:  General: He is awake. He is not in acute distress.    Appearance: He is well-developed and well-groomed. He is not ill-appearing or toxic-appearing.  HENT:     Head: Normocephalic.     Right Ear: Hearing and external ear normal.     Left Ear: Hearing and external ear normal.  Eyes:     General: Lids are normal.     Extraocular Movements: Extraocular movements intact.     Conjunctiva/sclera: Conjunctivae normal.  Neck:     Thyroid : No thyromegaly.     Vascular: No carotid bruit.  Cardiovascular:     Rate and Rhythm: Regular rhythm. Bradycardia present.     Heart sounds: Normal heart sounds. No murmur heard.    No gallop.  Pulmonary:     Effort: No accessory muscle usage or respiratory distress.     Breath sounds: Normal breath sounds.  Abdominal:     General: Bowel sounds are normal. There is no distension.     Palpations: Abdomen is soft.     Tenderness: There is no abdominal tenderness.  Musculoskeletal:     Cervical back: Full passive range of motion without pain.     Right lower leg: 1+ Edema present.     Left lower leg: 1+ Edema present.  Lymphadenopathy:     Cervical: No cervical adenopathy.  Skin:    General: Skin is warm.     Capillary Refill: Capillary refill takes less than 2 seconds.     Findings: Bruising present.     Comments: Scattered bruises bilateral upper extremities.  Neurological:     Mental Status: He is alert and oriented to person, place, and time.     Deep Tendon Reflexes: Reflexes are normal and symmetric.     Reflex Scores:      Brachioradialis reflexes are 2+ on the right side and 2+ on the left side.      Patellar reflexes are 2+ on the right side and 2+ on the left side. Psychiatric:        Attention and Perception: Attention  normal.        Mood and Affect: Mood normal.        Speech: Speech normal.        Behavior: Behavior normal. Behavior is cooperative.        Thought Content: Thought content normal.    Results for orders placed or performed in visit on 12/06/23  OPHTHALMOLOGY REPORT-SCANNED   Collection Time: 12/05/23 10:34 AM  Result Value Ref Range   HM Diabetic Eye Exam No Retinopathy No Retinopathy      Assessment & Plan:   Problem List Items Addressed This Visit       Cardiovascular and Mediastinum   Hypertension associated with type 2 diabetes mellitus (HCC)   Chronic, stable.  BP at goal for age on recheck.  Avoid hypotension. Continue current medication regimen and adjust as needed.  Urine ALB 80 (February 2025).  Losartan  for kidney protection.  Refer to nephrology if worsening kidney function.  Recommend he monitor his BP regularly at home and follow DASH diet.  Continue to collaborate with cardiology.  LABS: CBC, CMP.  Return in 3 months.      Relevant Orders   Bayer DCA Hb A1c Waived   Chronic systolic heart failure (HCC)   Chronic, ongoing.  Baseline edema to BLE.  Continue collaboration with cardiology and current medication regimen.  Recommend: - Reminded to call for an overnight weight gain of >2 pounds or  a weekly weight weight of >5 pounds - not adding salt to his food and has been reading food labels. Reviewed the importance of keeping daily sodium intake to 2000mg  daily  - Avoid NSAIDs - Wear compression on during day and off at night       Aortic atherosclerosis   Chronic, stable.  Noted on imaging 02/08/18.  Continue daily medications for prevention, statin therapy.        Endocrine   Type 2 diabetes mellitus with proteinuria (HCC) - Primary   Chronic, ongoing with A1c 6% today, stable for age.  Urine ALB 80 (February 2025).   Losartan  for kidney protection.  Continue current medication regimen and adjust as needed.   Avoid hypoglycemia due to advanced age.  John refer  him to nephrology if worsening kidney function presents. Return in 3 months.  - ARB and Statin on board - Vaccinations up to date - Eye and foot exams up to date.      Relevant Orders   Bayer DCA Hb A1c Waived   Hyperlipidemia associated with type 2 diabetes mellitus (HCC)   Chronic, ongoing.  Continue current medication regimen and adjust as needed.  Lipid panel today.      Relevant Orders   Bayer DCA Hb A1c Waived   Comprehensive metabolic panel with GFR   Lipid Panel w/o Chol/HDL Ratio   Diabetic neuropathy, painful (HCC)   Chronic, ongoing with A1c 6% today, stable for age.  Urine ALB 80 (February 2025).   Losartan  for kidney protection.  Continue current medication regimen and adjust as needed.   Avoid hypoglycemia due to advanced age.  John trial a low dose of Gabapentin at night and recommend he stop Aleeve due to concerns for worsening kidney function with this daily. Return in 3 months.  - ARB and Statin on board - Vaccinations up to date - Eye and foot exams up to date.      Diabetes mellitus treated with oral medication (HCC)   Refer to diabetes with proteinuria plan of care.      Relevant Orders   Bayer DCA Hb A1c Waived     Genitourinary   Stage 3a chronic kidney disease (HCC)   Chronic, stable with recent labs showing stability in Stage 3a disease.  Continue Losartan  for kidney protection.  Refer to nephrology as needed.      Bladder cancer (HCC)   Chronic.  Continue to collaborate with urology, at length discussion on goals of care John continue at visits.        Other   Iron  deficiency anemia   Ongoing, stable.  Continue daily iron .  Recheck iron  and CBC today.  If ongoing stability John stop iron  supplement.      Relevant Orders   Ferritin   Iron    CBC with Differential/Platelet     Follow up plan: Return in about 4 weeks (around 03/14/2024) for Neuropathy (started Gabapentin 02/15/24) + 3 month visit T2DM, HTN/HLD, CKD.

## 2024-02-15 NOTE — Assessment & Plan Note (Signed)
 Chronic.  Continue to collaborate with urology, at length discussion on goals of care will continue at visits.

## 2024-02-15 NOTE — Assessment & Plan Note (Signed)
 Chronic, ongoing.  Baseline edema to BLE.  Continue collaboration with cardiology and current medication regimen.  Recommend: - Reminded to call for an overnight weight gain of >2 pounds or a weekly weight weight of >5 pounds - not adding salt to his food and has been reading food labels. Reviewed the importance of keeping daily sodium intake to 2000mg  daily  - Avoid NSAIDs - Wear compression on during day and off at night

## 2024-02-15 NOTE — Assessment & Plan Note (Signed)
 Chronic, stable with recent labs showing stability in Stage 3a disease.  Continue Losartan  for kidney protection.  Refer to nephrology as needed.

## 2024-02-15 NOTE — Assessment & Plan Note (Signed)
 Chronic, ongoing.  Continue current medication regimen and adjust as needed. Lipid panel today.

## 2024-02-15 NOTE — Assessment & Plan Note (Signed)
 Chronic, stable.  BP at goal for age on recheck.  Avoid hypotension. Continue current medication regimen and adjust as needed.  Urine ALB 80 (February 2025).  Losartan  for kidney protection.  Refer to nephrology if worsening kidney function.  Recommend he monitor his BP regularly at home and follow DASH diet.  Continue to collaborate with cardiology.  LABS: CBC, CMP.  Return in 3 months.

## 2024-02-15 NOTE — Assessment & Plan Note (Signed)
 Chronic, ongoing with A1c 6% today, stable for age.  Urine ALB 80 (February 2025).   Losartan  for kidney protection.  Continue current medication regimen and adjust as needed.   Avoid hypoglycemia due to advanced age.  Will trial a low dose of Gabapentin at night and recommend he stop Aleeve due to concerns for worsening kidney function with this daily. Return in 3 months.  - ARB and Statin on board - Vaccinations up to date - Eye and foot exams up to date.

## 2024-02-16 ENCOUNTER — Ambulatory Visit: Payer: Self-pay | Admitting: Nurse Practitioner

## 2024-02-16 LAB — COMPREHENSIVE METABOLIC PANEL WITH GFR
ALT: 22 IU/L (ref 0–44)
AST: 26 IU/L (ref 0–40)
Albumin: 4.1 g/dL (ref 3.7–4.7)
Alkaline Phosphatase: 66 IU/L (ref 48–129)
BUN/Creatinine Ratio: 19 (ref 10–24)
BUN: 29 mg/dL — ABNORMAL HIGH (ref 8–27)
Bilirubin Total: 0.6 mg/dL (ref 0.0–1.2)
CO2: 29 mmol/L (ref 20–29)
Calcium: 9.3 mg/dL (ref 8.6–10.2)
Chloride: 103 mmol/L (ref 96–106)
Creatinine, Ser: 1.53 mg/dL — ABNORMAL HIGH (ref 0.76–1.27)
Globulin, Total: 2.6 g/dL (ref 1.5–4.5)
Glucose: 121 mg/dL — ABNORMAL HIGH (ref 70–99)
Potassium: 4.7 mmol/L (ref 3.5–5.2)
Sodium: 144 mmol/L (ref 134–144)
Total Protein: 6.7 g/dL (ref 6.0–8.5)
eGFR: 44 mL/min/1.73 — ABNORMAL LOW (ref >59)

## 2024-02-16 LAB — IRON: Iron: 123 ug/dL (ref 38–169)

## 2024-02-16 LAB — CBC WITH DIFFERENTIAL/PLATELET
Basophils Absolute: 0 x10E3/uL (ref 0.0–0.2)
Basos: 0 %
EOS (ABSOLUTE): 0.4 x10E3/uL (ref 0.0–0.4)
Eos: 8 %
Hematocrit: 36.4 % — ABNORMAL LOW (ref 37.5–51.0)
Hemoglobin: 11.7 g/dL — ABNORMAL LOW (ref 13.0–17.7)
Immature Grans (Abs): 0 x10E3/uL (ref 0.0–0.1)
Immature Granulocytes: 0 %
Lymphocytes Absolute: 1.5 x10E3/uL (ref 0.7–3.1)
Lymphs: 30 %
MCH: 29.1 pg (ref 26.6–33.0)
MCHC: 32.1 g/dL (ref 31.5–35.7)
MCV: 91 fL (ref 79–97)
Monocytes Absolute: 0.4 x10E3/uL (ref 0.1–0.9)
Monocytes: 7 %
Neutrophils Absolute: 2.8 x10E3/uL (ref 1.4–7.0)
Neutrophils: 55 %
Platelets: 186 x10E3/uL (ref 150–450)
RBC: 4.02 x10E6/uL — ABNORMAL LOW (ref 4.14–5.80)
RDW: 12.4 % (ref 11.6–15.4)
WBC: 5 x10E3/uL (ref 3.4–10.8)

## 2024-02-16 LAB — LIPID PANEL W/O CHOL/HDL RATIO
Cholesterol, Total: 119 mg/dL (ref 100–199)
HDL: 54 mg/dL (ref >39)
LDL Chol Calc (NIH): 49 mg/dL (ref 0–99)
Triglycerides: 81 mg/dL (ref 0–149)
VLDL Cholesterol Cal: 16 mg/dL (ref 5–40)

## 2024-02-16 LAB — FERRITIN: Ferritin: 156 ng/mL (ref 30–400)

## 2024-02-16 NOTE — Progress Notes (Signed)
 Contacted via MyChart  Good afternoon John [name], your labs have returned: - Kidney function, creatinine and eGFR, continues to show some kidney disease and this has trended down a little this check. Please ensure to avoid Ibuprofen products and ensure adequate water intake. - CBC continues to show mildly low hemoglobin and hematocrit, but iron  level stable. Suspect some of this is related to kidney disease and we will continue to monitor. - Lipid panel is stable and at goal levels. No medication changes needed. Any questions? Keep being stellar!!  Thank you for allowing me to participate in your care.  I appreciate you. Kindest regards, Kirandeep Fariss

## 2024-03-10 NOTE — Patient Instructions (Signed)
 Nerve Damage (Peripheral Neuropathy): What to Know Peripheral neuropathy happens when there's damage to the peripheral nerves. These nerves send signals between your spinal cord and your arms and legs. You can have damage in one or more nerves. What are the causes? There are many reasons why nerves can get damaged. Damage may be caused by some diseases, such as: Diabetes. This is the most common cause. Autoimmune diseases, such as rheumatoid arthritis or lupus. These happen when your body's defense system (immune system) attacks healthy parts of your body by mistake. Inherited nerve disease. This is passed down in families. Kidney disease. Thyroid disease. Other causes include: Pressure or stress on a nerve that lasts a long time. Lack of B vitamins from poor diet or drinking too much alcohol. Infections. Some medicines, like chemotherapy used for cancer treatment. Poisonous (toxic) substances, such as lead or mercury. Too little blood flowing to the legs. Sometimes, the cause isn't known. What are the signs or symptoms? Symptoms depend on which nerves are damaged. In your legs, hands, or arms, you may have: Loss of feeling (numbness). Tingling. Burning pain. Very sensitive skin. Weakness. Paralysis. This is when you can't move a part of your body. Clumsiness. Muscle twitching. Loss of balance. You may have trouble walking. Other symptoms can include: Not being able to control when you pee. Feeling dizzy. Problems during sex. How is this diagnosed? Your health care provider will: Ask about your symptoms and medical history. Do a physical exam. Do a neurological exam. They will check your reflexes, how you move, and what you can feel. You may need to have other tests, such as: Blood tests. Nerve tests to check how well your nerves and muscles work. Imaging tests, such as a CT scan or MRI. These are done to rule out other causes. Nerve biopsy. This is when a small piece of a  nerve is removed for testing. Lumbar puncture. A small amount of the fluid that surrounds the brain and spinal cord is taken and checked in a lab. How is this treated? Treatment may include: Treating the cause of the nerve damage. Pain medicines. Other medicines that may help with pain, such as: Medicines that treat seizures. Medicines that treat depression. Pain patches or creams that are put on painful areas of skin. TENS therapy. This uses a device that sends mild electrical pulses to your nerves. This will prevent pain signals from reaching the brain. Massage. Acupuncture. Surgery to relieve pressure on a nerve or to destroy a nerve that's causing pain. This is done only if the other treatments are not helping. You may also need physical therapy to help improve your movement, balance, and muscle strength. You may be told to use a cane or walker if needed. Follow these instructions at home: Medicines Take your medicines only as told. You may need to take steps to help treat or prevent trouble pooping (constipation), such as: Taking medicines to help you poop. Eating foods high in fiber, like beans, whole grains, and fresh fruits and vegetables. Drinking more fluids as told. Ask your provider if it's safe to drive or use machines while taking your medicine. Lifestyle  Do not smoke, vape, or use nicotine or tobacco. Avoid or limit alcohol. Too much alcohol can be harmful to nerves and cause damage. Eat a healthy diet. Safety  Take care to avoid burning or damaging your skin if you have numbness. If you have numbness in your feet: Check your feet each day for signs of injury  or infection. Watch for redness, warmth, and swelling. Wear padded socks and comfortable shoes. These help protect your feet. General instructions If you have diabetes, keep your blood sugar under control. Develop a good support system. Living with nerve damage can cause a lot of stress. Talk with a mental  health therapist or join a support group. Exercise as told. Ask what things are safe for you to do at home. Work with a pain specialist to find the best way to manage your pain. Keep all follow-up visits. Your provider will check if the treatments are working and change them if needed. Where to find support The Foundation for Peripheral Neuropathy: [employer]si Where to find more information To learn more, go to: [employer] of Neurological Disorders and Stroke at Basic[employer]no. Click Search and type peripheral neuropathy. Find the link you need. Contact a health care provider if: Your pain treatments are not working. Your symptoms are getting worse. You have side effects from any of your medicines. You have new symptoms. You're having a hard time coping with your condition. Get help right away if: You have a wound that's not healing. You have new weakness in an arm or leg. You've fallen or you fall often. This information is not intended to replace advice given to you by your health care provider. Make sure you discuss any questions you have with your health care provider. Document Revised: 05/12/2023 Document Reviewed: 05/12/2023 Elsevier Patient Education  2025 [employer].

## 2024-03-15 ENCOUNTER — Ambulatory Visit: Admitting: Nurse Practitioner

## 2024-03-15 ENCOUNTER — Encounter: Payer: Self-pay | Admitting: Nurse Practitioner

## 2024-03-15 VITALS — BP 136/80 | HR 58 | Resp 17 | Ht 72.01 in | Wt 200.2 lb

## 2024-03-15 DIAGNOSIS — M25552 Pain in left hip: Secondary | ICD-10-CM | POA: Diagnosis not present

## 2024-03-15 DIAGNOSIS — G8929 Other chronic pain: Secondary | ICD-10-CM | POA: Insufficient documentation

## 2024-03-15 NOTE — Assessment & Plan Note (Signed)
 Chronic, having him stop Gabapentin due to side effects. He has already stopped taking. ?if his pain is coming more from his left hip. Will get him in to see Dr. Hooten at [facility] to discuss options for this. ?if would qualify for steroid injection if hip is causing the pain. Continue at home regimen as needed.

## 2024-03-15 NOTE — Progress Notes (Signed)
 "  BP 136/80 (BP Location: Left Arm, Patient Position: Sitting, Cuff Size: Normal)   Pulse (!) 58   Resp 17   Ht 6' 0.01 (1.829 m)   Wt 200 lb 3.2 oz (90.8 kg)   BMI 27.15 kg/m    Subjective:    Patient ID: John [name], male    DOB: Nov 05, 1940, 84 y.o.   MRN: [id number]  HPI: John [name] is a 84 y.o. male  Chief Complaint  Patient presents with   Follow-up    Here for follow up on gabapentin. Taking one a day was not helping at all, so he started taking two a day and that helps a little. Foot swell up and then went away, foot is starting to pill. Not sure if the pills are what is causing it or not.   NEUROPATHY Took up to 200 MG with no benefit and then left foot swelled up and started to turn red and skin peeled. Stopped taking. Trying Neurvive, but this has not been happening. L>R. Left hip causes him a lot of trouble. Pain is worse at night when sleeping. Does not bother him during the day. Left side at night John burn and be sharp from buttock to foot.  Saw Dr. Fidel at [facility] in [place] followed him. Duration: chronic Onset: gradual Location: both leg L>R Bilateral: yes Symmetric: yes Decreased sensation: no  Weakness: occasional Pain: yes Quality:  sharp, aching, shooting, and throbbing Severity: 8/10  Frequency: intermittent Trauma: no Recent illness: no Diabetes: yes Thyroid  disease: no  HIV: no  Alcoholism: no  Spinal cord injury: no Alleviating factors: nothing Aggravating factors: unknown Status: uncontrolled Treatments attempted: Tylenol , [place], Neurvive,   Relevant past medical, surgical, family and social history reviewed and updated as indicated. Interim medical history since our last visit reviewed. Allergies and medications reviewed and updated.  Review of Systems  Constitutional:  Negative for activity change, diaphoresis, fatigue and fever.  Respiratory:  Negative for cough, chest tightness, shortness of breath and wheezing.    Cardiovascular:  Negative for chest pain, palpitations and leg swelling.  Gastrointestinal: Negative.   Endocrine: Negative for cold intolerance, heat intolerance, polydipsia, polyphagia and polyuria.  Musculoskeletal:  Positive for arthralgias.  Skin: Negative.   Neurological: Negative.   Psychiatric/Behavioral: Negative.      Per HPI unless specifically indicated above     Objective:    BP 136/80 (BP Location: Left Arm, Patient Position: Sitting, Cuff Size: Normal)   Pulse (!) 58   Resp 17   Ht 6' 0.01 (1.829 m)   Wt 200 lb 3.2 oz (90.8 kg)   BMI 27.15 kg/m   Wt Readings from Last 3 Encounters:  03/15/24 200 lb 3.2 oz (90.8 kg)  02/15/24 196 lb 12.8 oz (89.3 kg)  01/03/24 200 lb (90.7 kg)    Physical Exam Vitals and nursing note reviewed.  Constitutional:      General: He is awake. He is not in acute distress.    Appearance: He is well-developed and well-groomed. He is not ill-appearing or toxic-appearing.  HENT:     Head: Normocephalic.     Right Ear: Hearing and external ear normal.     Left Ear: Hearing and external ear normal.  Eyes:     General: Lids are normal.     Extraocular Movements: Extraocular movements intact.     Conjunctiva/sclera: Conjunctivae normal.  Neck:     Thyroid : No thyromegaly.     Vascular: No carotid bruit.  Cardiovascular:     Rate and Rhythm: Regular rhythm. Bradycardia present.     Heart sounds: Normal heart sounds. No murmur heard.    No gallop.  Pulmonary:     Effort: No accessory muscle usage or respiratory distress.     Breath sounds: Normal breath sounds.  Abdominal:     General: Bowel sounds are normal. There is no distension.     Palpations: Abdomen is soft.     Tenderness: There is no abdominal tenderness.  Musculoskeletal:     Cervical back: Full passive range of motion without pain.     Right hip: Normal.     Left hip: Tenderness and crepitus present. Decreased range of motion. Decreased strength.     Right lower  leg: 1+ Edema present.     Left lower leg: 1+ Edema present.     Comments: Antalgic gait. Skin to left foot intact, very mild redness to top of foot. He reports this is much improved.  Lymphadenopathy:     Cervical: No cervical adenopathy.  Skin:    General: Skin is warm.     Capillary Refill: Capillary refill takes less than 2 seconds.     Findings: Bruising present.     Comments: Scattered bruises bilateral upper extremities.  Neurological:     Mental Status: He is alert and oriented to person, place, and time.     Deep Tendon Reflexes: Reflexes are normal and symmetric.     Reflex Scores:      Brachioradialis reflexes are 2+ on the right side and 2+ on the left side.      Patellar reflexes are 2+ on the right side and 2+ on the left side. Psychiatric:        Attention and Perception: Attention normal.        Mood and Affect: Mood normal.        Speech: Speech normal.        Behavior: Behavior normal. Behavior is cooperative.        Thought Content: Thought content normal.     Results for orders placed or performed in visit on 02/15/24  Bayer DCA Hb A1c Waived   Collection Time: 02/15/24  8:29 AM  Result Value Ref Range   HB A1C (BAYER DCA - WAIVED) 6.0 (H) 4.8 - 5.6 %  Ferritin   Collection Time: 02/15/24  8:30 AM  Result Value Ref Range   Ferritin 156 30 - 400 ng/mL  Iron    Collection Time: 02/15/24  8:30 AM  Result Value Ref Range   Iron  123 38 - 169 ug/dL  CBC with Differential/Platelet   Collection Time: 02/15/24  8:30 AM  Result Value Ref Range   WBC 5.0 3.4 - 10.8 x10E3/uL   RBC 4.02 (L) 4.14 - 5.80 x10E6/uL   Hemoglobin 11.7 (L) 13.0 - 17.7 g/dL   Hematocrit 63.5 (L) 62.4 - 51.0 %   MCV 91 79 - 97 fL   MCH 29.1 26.6 - 33.0 pg   MCHC 32.1 31.5 - 35.7 g/dL   RDW 87.5 88.3 - 84.5 %   Platelets 186 150 - 450 x10E3/uL   Neutrophils 55 Not Estab. %   Lymphs 30 Not Estab. %   Monocytes 7 Not Estab. %   Eos 8 Not Estab. %   Basos 0 Not Estab. %   Neutrophils  Absolute 2.8 1.4 - 7.0 x10E3/uL   Lymphocytes Absolute 1.5 0.7 - 3.1 x10E3/uL   Monocytes Absolute 0.4 0.1 - 0.9 x10E3/uL  EOS (ABSOLUTE) 0.4 0.0 - 0.4 x10E3/uL   Basophils Absolute 0.0 0.0 - 0.2 x10E3/uL   Immature Granulocytes 0 Not Estab. %   Immature Grans (Abs) 0.0 0.0 - 0.1 x10E3/uL  Comprehensive metabolic panel with GFR   Collection Time: 02/15/24  8:30 AM  Result Value Ref Range   Glucose 121 (H) 70 - 99 mg/dL   BUN 29 (H) 8 - 27 mg/dL   Creatinine, Ser 8.46 (H) 0.76 - 1.27 mg/dL   eGFR 44 (L) >40 fO/fpw/8.26   BUN/Creatinine Ratio 19 10 - 24   Sodium 144 134 - 144 mmol/L   Potassium 4.7 3.5 - 5.2 mmol/L   Chloride 103 96 - 106 mmol/L   CO2 29 20 - 29 mmol/L   Calcium  9.3 8.6 - 10.2 mg/dL   Total Protein 6.7 6.0 - 8.5 g/dL   Albumin  4.1 3.7 - 4.7 g/dL   Globulin, Total 2.6 1.5 - 4.5 g/dL   Bilirubin Total 0.6 0.0 - 1.2 mg/dL   Alkaline Phosphatase 66 48 - 129 IU/L   AST 26 0 - 40 IU/L   ALT 22 0 - 44 IU/L  Lipid Panel w/o Chol/HDL Ratio   Collection Time: 02/15/24  8:30 AM  Result Value Ref Range   Cholesterol, Total 119 100 - 199 mg/dL   Triglycerides 81 0 - 149 mg/dL   HDL 54 >60 mg/dL   VLDL Cholesterol Cal 16 5 - 40 mg/dL   LDL Chol Calc (NIH) 49 0 - 99 mg/dL      Assessment & Plan:   Problem List Items Addressed This Visit       Other   Chronic left hip pain - Primary   Chronic, having him stop Gabapentin due to side effects. He has already stopped taking. ?if his pain is coming more from his left hip. John get him in to see Dr. Hooten at [facility] to discuss options for this. ?if would qualify for steroid injection if hip is causing the pain. Continue at home regimen as needed.      Relevant Orders   Ambulatory referral to Orthopedic Surgery     Follow up plan: Return for AS SCHEDULED IN [facility].      "

## 2024-05-25 ENCOUNTER — Ambulatory Visit: Admitting: Nurse Practitioner

## 2024-07-12 ENCOUNTER — Other Ambulatory Visit: Admitting: Urology

## 2025-01-08 ENCOUNTER — Ambulatory Visit
